# Patient Record
Sex: Female | Born: 1945
Health system: Southern US, Community
[De-identification: ages and names within clinical notes are randomized; demographics above are authoritative.]

## PROBLEM LIST (undated history)

## (undated) DIAGNOSIS — R55 Syncope and collapse: Secondary | ICD-10-CM

## (undated) DIAGNOSIS — C801 Malignant (primary) neoplasm, unspecified: Secondary | ICD-10-CM

## (undated) DIAGNOSIS — J449 Chronic obstructive pulmonary disease, unspecified: Secondary | ICD-10-CM

## (undated) DIAGNOSIS — K219 Gastro-esophageal reflux disease without esophagitis: Secondary | ICD-10-CM

## (undated) DIAGNOSIS — M21611 Bunion of right foot: Secondary | ICD-10-CM

## (undated) DIAGNOSIS — E559 Vitamin D deficiency, unspecified: Secondary | ICD-10-CM

## (undated) DIAGNOSIS — E119 Type 2 diabetes mellitus without complications: Secondary | ICD-10-CM

## (undated) DIAGNOSIS — R970 Elevated carcinoembryonic antigen [CEA]: Secondary | ICD-10-CM

## (undated) DIAGNOSIS — M549 Dorsalgia, unspecified: Secondary | ICD-10-CM

## (undated) DIAGNOSIS — Z8489 Family history of other specified conditions: Secondary | ICD-10-CM

## (undated) DIAGNOSIS — M1990 Unspecified osteoarthritis, unspecified site: Secondary | ICD-10-CM

## (undated) DIAGNOSIS — G8929 Other chronic pain: Secondary | ICD-10-CM

## (undated) DIAGNOSIS — E2839 Other primary ovarian failure: Secondary | ICD-10-CM

## (undated) DIAGNOSIS — I10 Essential (primary) hypertension: Secondary | ICD-10-CM

## (undated) DIAGNOSIS — G4700 Insomnia, unspecified: Secondary | ICD-10-CM

## (undated) DIAGNOSIS — R0989 Other specified symptoms and signs involving the circulatory and respiratory systems: Secondary | ICD-10-CM

## (undated) DIAGNOSIS — E785 Hyperlipidemia, unspecified: Secondary | ICD-10-CM

## (undated) DIAGNOSIS — Z8542 Personal history of malignant neoplasm of other parts of uterus: Secondary | ICD-10-CM

## (undated) DIAGNOSIS — D649 Anemia, unspecified: Secondary | ICD-10-CM

## (undated) DIAGNOSIS — R0683 Snoring: Secondary | ICD-10-CM

## (undated) DIAGNOSIS — J411 Mucopurulent chronic bronchitis: Secondary | ICD-10-CM

## (undated) DIAGNOSIS — R0600 Dyspnea, unspecified: Secondary | ICD-10-CM

## (undated) HISTORY — PX: BREAST SURGERY: SHX581

## (undated) HISTORY — PX: BREAST BIOPSY: SHX20

## (undated) HISTORY — DX: Mucopurulent chronic bronchitis: J41.1

## (undated) HISTORY — DX: Malignant (primary) neoplasm, unspecified: C80.1

## (undated) HISTORY — PX: COLON SURGERY: SHX602

## (undated) HISTORY — DX: Snoring: R06.83

## (undated) HISTORY — DX: Gastro-esophageal reflux disease without esophagitis: K21.9

## (undated) HISTORY — DX: Hyperlipidemia, unspecified: E78.5

## (undated) HISTORY — DX: Other chronic pain: G89.29

## (undated) HISTORY — DX: Dorsalgia, unspecified: M54.9

## (undated) HISTORY — PX: ABDOMINAL HYSTERECTOMY: SHX81

## (undated) HISTORY — DX: Personal history of malignant neoplasm of other parts of uterus: Z85.42

## (undated) HISTORY — DX: Vitamin D deficiency, unspecified: E55.9

## (undated) HISTORY — DX: Other specified symptoms and signs involving the circulatory and respiratory systems: R09.89

## (undated) HISTORY — DX: Other primary ovarian failure: E28.39

## (undated) HISTORY — PX: BACK SURGERY: SHX140

## (undated) HISTORY — DX: Insomnia, unspecified: G47.00

## (undated) HISTORY — PX: JOINT REPLACEMENT: SHX530

## (undated) HISTORY — PX: BREAST EXCISIONAL BIOPSY: SUR124

## (undated) HISTORY — DX: Bunion of right foot: M21.611

---

## 1992-11-29 HISTORY — PX: SPINAL FUSION: SHX223

## 2006-03-23 ENCOUNTER — Ambulatory Visit: Payer: Self-pay | Admitting: Internal Medicine

## 2007-04-27 ENCOUNTER — Ambulatory Visit: Payer: Self-pay | Admitting: Family Medicine

## 2008-05-14 ENCOUNTER — Ambulatory Visit: Payer: Self-pay | Admitting: Family Medicine

## 2009-04-29 LAB — HM COLONOSCOPY

## 2009-05-08 ENCOUNTER — Ambulatory Visit: Payer: Self-pay | Admitting: Gastroenterology

## 2009-05-15 ENCOUNTER — Ambulatory Visit: Payer: Self-pay | Admitting: Family Medicine

## 2009-07-24 ENCOUNTER — Ambulatory Visit: Payer: Self-pay | Admitting: General Surgery

## 2009-07-31 ENCOUNTER — Ambulatory Visit: Payer: Self-pay | Admitting: General Surgery

## 2009-11-29 LAB — HM DEXA SCAN: HM Dexa Scan: NORMAL

## 2010-05-04 DIAGNOSIS — R809 Proteinuria, unspecified: Secondary | ICD-10-CM | POA: Insufficient documentation

## 2010-05-04 DIAGNOSIS — E559 Vitamin D deficiency, unspecified: Secondary | ICD-10-CM | POA: Insufficient documentation

## 2010-05-19 ENCOUNTER — Ambulatory Visit: Payer: Self-pay | Admitting: Family Medicine

## 2010-07-09 LAB — HM PAP SMEAR: HM Pap smear: NORMAL

## 2011-06-01 ENCOUNTER — Ambulatory Visit: Payer: Self-pay | Admitting: Family Medicine

## 2012-07-04 ENCOUNTER — Ambulatory Visit: Payer: Self-pay | Admitting: Family Medicine

## 2013-07-05 ENCOUNTER — Ambulatory Visit: Payer: Self-pay | Admitting: Family Medicine

## 2013-12-14 ENCOUNTER — Ambulatory Visit: Payer: Self-pay | Admitting: Family Medicine

## 2014-07-11 ENCOUNTER — Ambulatory Visit: Payer: Self-pay | Admitting: Family Medicine

## 2014-07-11 LAB — HM MAMMOGRAPHY: HM Mammogram: NORMAL

## 2014-11-25 LAB — LIPID PANEL
Cholesterol: 171 mg/dL (ref 0–200)
HDL: 65 mg/dL (ref 35–70)
LDL Cholesterol: 84 mg/dL
Triglycerides: 112 mg/dL (ref 40–160)

## 2014-12-27 DIAGNOSIS — Z96652 Presence of left artificial knee joint: Secondary | ICD-10-CM | POA: Diagnosis not present

## 2014-12-27 DIAGNOSIS — M25469 Effusion, unspecified knee: Secondary | ICD-10-CM | POA: Diagnosis not present

## 2014-12-27 DIAGNOSIS — Z96653 Presence of artificial knee joint, bilateral: Secondary | ICD-10-CM | POA: Diagnosis not present

## 2014-12-27 DIAGNOSIS — Z96651 Presence of right artificial knee joint: Secondary | ICD-10-CM | POA: Diagnosis not present

## 2014-12-27 DIAGNOSIS — I10 Essential (primary) hypertension: Secondary | ICD-10-CM | POA: Diagnosis not present

## 2014-12-27 DIAGNOSIS — G8929 Other chronic pain: Secondary | ICD-10-CM | POA: Diagnosis not present

## 2014-12-27 DIAGNOSIS — S82102D Unspecified fracture of upper end of left tibia, subsequent encounter for closed fracture with routine healing: Secondary | ICD-10-CM | POA: Diagnosis not present

## 2014-12-27 DIAGNOSIS — M545 Low back pain: Secondary | ICD-10-CM | POA: Diagnosis not present

## 2014-12-27 DIAGNOSIS — Z471 Aftercare following joint replacement surgery: Secondary | ICD-10-CM | POA: Diagnosis not present

## 2015-01-14 DIAGNOSIS — M4802 Spinal stenosis, cervical region: Secondary | ICD-10-CM | POA: Diagnosis not present

## 2015-01-14 DIAGNOSIS — M5116 Intervertebral disc disorders with radiculopathy, lumbar region: Secondary | ICD-10-CM | POA: Insufficient documentation

## 2015-01-14 DIAGNOSIS — M5416 Radiculopathy, lumbar region: Secondary | ICD-10-CM | POA: Diagnosis not present

## 2015-01-14 DIAGNOSIS — M5412 Radiculopathy, cervical region: Secondary | ICD-10-CM | POA: Diagnosis not present

## 2015-01-24 ENCOUNTER — Ambulatory Visit: Payer: Self-pay | Admitting: Physical Medicine and Rehabilitation

## 2015-02-06 DIAGNOSIS — M5030 Other cervical disc degeneration, unspecified cervical region: Secondary | ICD-10-CM | POA: Insufficient documentation

## 2015-02-14 DIAGNOSIS — Z9181 History of falling: Secondary | ICD-10-CM | POA: Diagnosis not present

## 2015-02-14 DIAGNOSIS — R079 Chest pain, unspecified: Secondary | ICD-10-CM | POA: Diagnosis not present

## 2015-02-14 DIAGNOSIS — Z0000 Encounter for general adult medical examination without abnormal findings: Secondary | ICD-10-CM | POA: Diagnosis not present

## 2015-02-14 DIAGNOSIS — Z124 Encounter for screening for malignant neoplasm of cervix: Secondary | ICD-10-CM | POA: Diagnosis not present

## 2015-02-14 DIAGNOSIS — R7989 Other specified abnormal findings of blood chemistry: Secondary | ICD-10-CM | POA: Diagnosis not present

## 2015-02-14 DIAGNOSIS — Z1389 Encounter for screening for other disorder: Secondary | ICD-10-CM | POA: Diagnosis not present

## 2015-02-14 DIAGNOSIS — R5383 Other fatigue: Secondary | ICD-10-CM | POA: Diagnosis not present

## 2015-02-14 DIAGNOSIS — Z1239 Encounter for other screening for malignant neoplasm of breast: Secondary | ICD-10-CM | POA: Diagnosis not present

## 2015-02-14 DIAGNOSIS — Z1211 Encounter for screening for malignant neoplasm of colon: Secondary | ICD-10-CM | POA: Diagnosis not present

## 2015-02-20 HISTORY — PX: OTHER SURGICAL HISTORY: SHX169

## 2015-02-25 DIAGNOSIS — I209 Angina pectoris, unspecified: Secondary | ICD-10-CM | POA: Diagnosis not present

## 2015-02-25 DIAGNOSIS — E782 Mixed hyperlipidemia: Secondary | ICD-10-CM | POA: Diagnosis not present

## 2015-02-25 DIAGNOSIS — I10 Essential (primary) hypertension: Secondary | ICD-10-CM | POA: Diagnosis not present

## 2015-03-03 DIAGNOSIS — M5412 Radiculopathy, cervical region: Secondary | ICD-10-CM | POA: Diagnosis not present

## 2015-03-03 DIAGNOSIS — E782 Mixed hyperlipidemia: Secondary | ICD-10-CM | POA: Diagnosis not present

## 2015-03-03 DIAGNOSIS — I10 Essential (primary) hypertension: Secondary | ICD-10-CM | POA: Diagnosis not present

## 2015-03-03 DIAGNOSIS — I209 Angina pectoris, unspecified: Secondary | ICD-10-CM | POA: Diagnosis not present

## 2015-03-03 DIAGNOSIS — R0789 Other chest pain: Secondary | ICD-10-CM | POA: Diagnosis not present

## 2015-03-28 DIAGNOSIS — E785 Hyperlipidemia, unspecified: Secondary | ICD-10-CM | POA: Diagnosis not present

## 2015-03-28 DIAGNOSIS — D473 Essential (hemorrhagic) thrombocythemia: Secondary | ICD-10-CM | POA: Diagnosis not present

## 2015-03-28 DIAGNOSIS — N189 Chronic kidney disease, unspecified: Secondary | ICD-10-CM | POA: Diagnosis not present

## 2015-03-28 DIAGNOSIS — E1122 Type 2 diabetes mellitus with diabetic chronic kidney disease: Secondary | ICD-10-CM | POA: Diagnosis not present

## 2015-03-28 DIAGNOSIS — Z713 Dietary counseling and surveillance: Secondary | ICD-10-CM | POA: Diagnosis not present

## 2015-03-28 DIAGNOSIS — I10 Essential (primary) hypertension: Secondary | ICD-10-CM | POA: Diagnosis not present

## 2015-03-28 DIAGNOSIS — R809 Proteinuria, unspecified: Secondary | ICD-10-CM | POA: Diagnosis not present

## 2015-03-28 LAB — HEMOGLOBIN A1C: Hgb A1c MFr Bld: 8 % — AB (ref 4.0–6.0)

## 2015-03-31 DIAGNOSIS — S43401A Unspecified sprain of right shoulder joint, initial encounter: Secondary | ICD-10-CM | POA: Diagnosis not present

## 2015-04-11 ENCOUNTER — Encounter
Admission: RE | Disposition: A | Discharge status: Home / Self Care | Payer: Self-pay | Source: Ambulatory Visit | Attending: Gastroenterology

## 2015-04-11 ENCOUNTER — Ambulatory Visit: Payer: Commercial Managed Care - HMO | Admitting: Anesthesiology

## 2015-04-11 ENCOUNTER — Ambulatory Visit
Admission: RE | Admit: 2015-04-11 | Discharge: 2015-04-11 | Disposition: A | Discharge status: Home / Self Care | Payer: Commercial Managed Care - HMO | Source: Ambulatory Visit | Attending: Gastroenterology | Admitting: Gastroenterology

## 2015-04-11 DIAGNOSIS — D124 Benign neoplasm of descending colon: Secondary | ICD-10-CM | POA: Insufficient documentation

## 2015-04-11 DIAGNOSIS — Z7951 Long term (current) use of inhaled steroids: Secondary | ICD-10-CM | POA: Diagnosis not present

## 2015-04-11 DIAGNOSIS — Z86010 Personal history of colonic polyps: Secondary | ICD-10-CM | POA: Insufficient documentation

## 2015-04-11 DIAGNOSIS — Z79899 Other long term (current) drug therapy: Secondary | ICD-10-CM | POA: Diagnosis not present

## 2015-04-11 DIAGNOSIS — D122 Benign neoplasm of ascending colon: Secondary | ICD-10-CM | POA: Diagnosis not present

## 2015-04-11 DIAGNOSIS — D125 Benign neoplasm of sigmoid colon: Secondary | ICD-10-CM | POA: Diagnosis not present

## 2015-04-11 DIAGNOSIS — Z7982 Long term (current) use of aspirin: Secondary | ICD-10-CM | POA: Diagnosis not present

## 2015-04-11 DIAGNOSIS — J45909 Unspecified asthma, uncomplicated: Secondary | ICD-10-CM | POA: Insufficient documentation

## 2015-04-11 DIAGNOSIS — I10 Essential (primary) hypertension: Secondary | ICD-10-CM | POA: Diagnosis not present

## 2015-04-11 DIAGNOSIS — E119 Type 2 diabetes mellitus without complications: Secondary | ICD-10-CM | POA: Diagnosis not present

## 2015-04-11 DIAGNOSIS — D120 Benign neoplasm of cecum: Secondary | ICD-10-CM | POA: Insufficient documentation

## 2015-04-11 DIAGNOSIS — K635 Polyp of colon: Secondary | ICD-10-CM | POA: Diagnosis not present

## 2015-04-11 HISTORY — DX: Essential (primary) hypertension: I10

## 2015-04-11 HISTORY — PX: COLONOSCOPY: SHX5424

## 2015-04-11 HISTORY — DX: Type 2 diabetes mellitus without complications: E11.9

## 2015-04-11 LAB — GLUCOSE, CAPILLARY: Glucose-Capillary: 147 mg/dL — ABNORMAL HIGH (ref 65–99)

## 2015-04-11 SURGERY — COLONOSCOPY
Anesthesia: General

## 2015-04-11 MED ORDER — MIDAZOLAM HCL 2 MG/2ML IJ SOLN: INTRAMUSCULAR | Status: DC | PRN

## 2015-04-11 MED ORDER — FENTANYL CITRATE (PF) 100 MCG/2ML IJ SOLN
INTRAMUSCULAR | Status: DC | PRN
  Administered 2015-04-11: 50 ug via INTRAVENOUS

## 2015-04-11 MED ORDER — MIDAZOLAM HCL 2 MG/2ML IJ SOLN
INTRAMUSCULAR | Status: DC | PRN
  Administered 2015-04-11: 2 mg via INTRAVENOUS

## 2015-04-11 MED ORDER — LIDOCAINE HCL (CARDIAC) 20 MG/ML IV SOLN
INTRAVENOUS | Status: DC | PRN
  Administered 2015-04-11: 60 mg via INTRAVENOUS

## 2015-04-11 MED ORDER — PROPOFOL INFUSION 10 MG/ML OPTIME
INTRAVENOUS | Status: DC | PRN
  Administered 2015-04-11: 160 ug/kg/min via INTRAVENOUS

## 2015-04-11 MED ORDER — SODIUM CHLORIDE 0.9 % IV SOLN
INTRAVENOUS | Status: DC
  Administered 2015-04-11: 1000 mL via INTRAVENOUS

## 2015-04-11 MED ORDER — SODIUM CHLORIDE 0.9 % IV SOLN: INTRAVENOUS | Status: DC

## 2015-04-11 NOTE — Op Note (Signed)
[facility] Gastroenterology Patient Name: Cindy Griffin Procedure Date: 04/11/2015 9:01 AM MRN: [id number] Account #: [id number] Date of Birth: 10-08-1946 Admit Type: Outpatient Age: 69 Room: [facility] ENDO ROOM 4 Gender: Female Note Status: Finalized Procedure:         Colonoscopy Indications:       Personal history of colonic polyps Providers:         Lupita Dawn. Candace Cruise, MD Referring MD:      Bethena Roys. Sowles, MD (Referring MD) Medicines:         Monitored Anesthesia Care Complications:     No immediate complications. Procedure:         Pre-Anesthesia Assessment:                    - Prior to the procedure, a History and Physical was                     performed, and patient medications, allergies and                     sensitivities were reviewed. The patient's tolerance of                     previous anesthesia was reviewed.                    - The risks and benefits of the procedure and the sedation                     options and risks were discussed with the patient. All                     questions were answered and informed consent was obtained.                    - After reviewing the risks and benefits, the patient was                     deemed in satisfactory condition to undergo the procedure.                    After obtaining informed consent, the colonoscope was                     passed under direct vision. Throughout the procedure, the                     patient's blood pressure, pulse, and oxygen saturations                     were monitored continuously. The Colonoscope was                     introduced through the anus and advanced to the the cecum,                     identified by appendiceal orifice and ileocecal valve. The                     colonoscopy was performed without difficulty. The patient                     tolerated the procedure well. The quality of the bowel  preparation was fair. Findings:      A small  polyp was found in the proximal ascending colon. The polyp was       sessile. The polyp was removed with a cold snare. Resection and       retrieval were complete.      A diminutive polyp was found in the cecum. The polyp was sessile. The       polyp was removed with a jumbo cold forceps. Resection and retrieval       were complete.      A medium polyp was found in the proximal ascending colon. The polyp was       sessile. The polyp was removed with a hot snare. Resection and retrieval       were complete.      A small polyp was found in the descending colon. The polyp was sessile.       The polyp was removed with a hot snare. Resection and retrieval were       complete.      Two sessile polyps were found in the sigmoid colon. The polyps were       small in size. These polyps were removed with a hot snare. Resection and       retrieval were complete.      The exam was otherwise without abnormality. Impression:        - One small polyp in the proximal ascending colon.                     Resected and retrieved.                    - One diminutive polyp in the cecum. Resected and                     retrieved.                    - One medium polyp in the proximal ascending colon.                     Resected and retrieved.                    - One small polyp in the descending colon. Resected and                     retrieved.                    - Two small polyps in the sigmoid colon. Resected and                     retrieved.                    - The examination was otherwise normal. Recommendation:    - Discharge patient to home.                    - Await pathology results.                    - Repeat colonoscopy in 3 years for surveillance based on                     pathology results.                    -  The findings and recommendations were discussed with the                     patient. Procedure Code(s): --- Professional ---                    [phone], Colonoscopy, flexible;  with removal of tumor(s),                     polyp(s), or other lesion(s) by snare technique                    [id number], 28, Colonoscopy, flexible; with biopsy, single or                     multiple Diagnosis Code(s): --- Professional ---                    D12.0, Benign neoplasm of cecum                    D12.2, Benign neoplasm of ascending colon                    D12.4, Benign neoplasm of descending colon                    D12.5, Benign neoplasm of sigmoid colon                    Z86.010, Personal history of colonic polyps CPT copyright 2014 [employer]. All rights reserved. The codes documented in this report are preliminary and upon coder review may  be revised to meet current compliance requirements. Hulen Luster, MD 04/11/2015 9:30:26 AM This report has been signed electronically. Number of Addenda: 0 Note Initiated On: 04/11/2015 9:01 AM Scope Withdrawal Time: 0 hours 16 minutes 37 seconds  Total Procedure Duration: 0 hours 21 minutes 53 seconds       [facility]

## 2015-04-11 NOTE — Transfer of Care (Signed)
Immediate Anesthesia Transfer of Care Note  Patient: Cindy Griffin  Procedure(s) Performed: Procedure(s): COLONOSCOPY (N/A)  Patient Location: PACU and Endoscopy Unit  Anesthesia Type:General  Level of Consciousness: awake, alert  and oriented  Airway & Oxygen Therapy: Patient Spontanous Breathing and Patient connected to nasal cannula oxygen  Post-op Assessment: Report given to RN, Post -op Vital signs reviewed and stable and Patient moving all extremities X 4  Post vital signs: Reviewed and stable  Last Vitals:  Filed Vitals:   04/11/15 0823  BP: 135/99  Pulse: 75  Temp: 36.4 C  Resp: 17    Complications: No apparent anesthesia complications

## 2015-04-11 NOTE — Anesthesia Preprocedure Evaluation (Addendum)
Anesthesia Evaluation  Patient identified by MRN, date of birth, ID band Patient awake    Reviewed: Allergy & Precautions, NPO status , Patient's Chart, lab work & pertinent test results  Airway Mallampati: II   Neck ROM: Full    Dental  (+) Upper Dentures, Lower Dentures   Pulmonary  Sinus issues breath sounds clear to auscultation  Pulmonary exam normal       Cardiovascular hypertension, Rhythm:Regular Rate:Normal     Neuro/Psych negative neurological ROS  negative psych ROS   GI/Hepatic Neg liver ROS, Hx of colon polyps   Endo/Other  diabetes, Well Controlled, Type 2  Renal/GU   negative genitourinary   Musculoskeletal negative musculoskeletal ROS (+)   Abdominal Normal abdominal exam  (+)   Peds negative pediatric ROS (+)  Hematology negative hematology ROS (+)   Anesthesia Other Findings   Reproductive/Obstetrics negative OB ROS                            Anesthesia Physical Anesthesia Plan  ASA: II  Anesthesia Plan: General   Post-op Pain Management:    Induction: Intravenous  Airway Management Planned: Nasal Cannula  Additional Equipment:   Intra-op Plan:   Post-operative Plan:   Informed Consent: I have reviewed the patients History and Physical, chart, labs and discussed the procedure including the risks, benefits and alternatives for the proposed anesthesia with the patient or authorized representative who has indicated his/her understanding and acceptance.   Dental advisory given  Plan Discussed with: CRNA and Surgeon  Anesthesia Plan Comments:         Anesthesia Quick Evaluation

## 2015-04-11 NOTE — Anesthesia Postprocedure Evaluation (Signed)
  Anesthesia Post-op Note  Patient: Cindy Griffin  Procedure(s) Performed: Procedure(s): COLONOSCOPY (N/A)  Anesthesia type:General  Patient location: PACU  Post pain: Pain level controlled  Post assessment: Post-op Vital signs reviewed, Patient's Cardiovascular Status Stable, Respiratory Function Stable, Patent Airway and No signs of Nausea or vomiting  Post vital signs: Reviewed and stable  Last Vitals:  Filed Vitals:   04/11/15 0930  BP: 97/58  Pulse: 78  Temp: 36.3 C  Resp: 23    Level of consciousness: awake, alert  and patient cooperative  Complications: No apparent anesthesia complications

## 2015-04-11 NOTE — H&P (Signed)
Primary Care Physician:  Christianne Borrow, MD Primary Gastroenterologist:  Dr. Candace Cruise  Pre-Procedure History & Physical: HPI:  Cindy Griffin is a 69 y.o. female is here for colonoscopy.   Past Medical History  Diagnosis Date  . Hypertension   . Diabetes mellitus without complication     Past Surgical History  Procedure Laterality Date  . Abdominal hysterectomy    . Joint replacement      left knee 2013 right knee 2005    Prior to Admission medications   Medication Sig Start Date End Date Taking? Authorizing Provider  acetaminophen (TYLENOL) 500 MG tablet Take 1,000 mg by mouth every 6 (six) hours as needed (pain).   Yes Historical Provider, MD  aspirin EC 81 MG tablet Take 81 mg by mouth daily.   Yes Historical Provider, MD  atorvastatin (LIPITOR) 80 MG tablet Take 80 mg by mouth daily.   Yes Historical Provider, MD  Bisacodyl (DULCOLAX PO) Take 1 tablet by mouth once.   Yes Historical Provider, MD  canagliflozin (INVOKANA) 300 MG TABS tablet Take 300 mg by mouth daily before breakfast.   Yes Historical Provider, MD  celecoxib (CELEBREX) 200 MG capsule Take 200 mg by mouth daily.   Yes Historical Provider, MD  Fluticasone-Salmeterol (ADVAIR) 100-50 MCG/DOSE AEPB Inhale 1 puff into the lungs 2 (two) times daily.   Yes Historical Provider, MD  glipiZIDE (GLUCOTROL) 5 MG tablet Take 5 mg by mouth daily before breakfast.   Yes Historical Provider, MD  hydrochlorothiazide (HYDRODIURIL) 25 MG tablet Take 25 mg by mouth daily.   Yes Historical Provider, MD  lisinopril (PRINIVIL,ZESTRIL) 40 MG tablet Take 40 mg by mouth 2 (two) times daily.   Yes Historical Provider, MD  metFORMIN (GLUCOPHAGE) 1000 MG tablet Take 1,000 mg by mouth 2 (two) times daily with a meal.   Yes Historical Provider, MD  Multiple Vitamins-Minerals (MULTIVITAMIN PO) Take 1 tablet by mouth daily.   Yes Historical Provider, MD  Omega-3 Fatty Acids (FISH OIL PO) Take 1 capsule by mouth daily.   Yes Historical  Provider, MD  omeprazole (PRILOSEC) 20 MG capsule Take 20 mg by mouth daily.   Yes Historical Provider, MD  polyethylene glycol powder (GLYCOLAX/MIRALAX) powder Take 1 Container by mouth once.   Yes Historical Provider, MD  tizanidine (ZANAFLEX) 2 MG capsule Take 2 mg by mouth 3 (three) times daily as needed for muscle spasms.   Yes Historical Provider, MD  traMADol (ULTRAM) 50 MG tablet Take 50 mg by mouth every 6 (six) hours as needed (pain).   Yes Historical Provider, MD    Allergies as of 03/25/2015  . (Not on File)    History reviewed. No pertinent family history.  History   Social History  . Marital Status: Widowed    Spouse Name: N/A  . Number of Children: N/A  . Years of Education: N/A   Occupational History  . Not on file.   Social History Main Topics  . Smoking status: Never Smoker   . Smokeless tobacco: Not on file  . Alcohol Use: Not on file  . Drug Use: Not on file  . Sexual Activity: Not on file   Other Topics Concern  . Not on file   Social History Narrative  . No narrative on file    Review of Systems: See HPI, otherwise negative ROS  Physical Exam: BP 135/99 mmHg  Pulse 75  Temp(Src) 97.5 F (36.4 C) (Tympanic)  Resp 17  Ht '5\' 4"'$  (1.626  m)  Wt 90.719 kg (200 lb)  BMI 34.31 kg/m2  SpO2 98% General:   Alert,  pleasant and cooperative in NAD Head:  Normocephalic and atraumatic. Neck:  Supple; no masses or thyromegaly. Lungs:  Clear throughout to auscultation.    Heart:  Regular rate and rhythm. Abdomen:  Soft, nontender and nondistended. Normal bowel sounds, without guarding, and without rebound.   Neurologic:  Alert and  oriented x4;  grossly normal neurologically.  Impression/Plan: Cindy Griffin is here for colonoscopy to be performed for personal hx of colon polyps.  Risks, benefits, limitations, and alternatives regarding colonoscopy have been reviewed with the patient.  Questions have been answered.  All parties  agreeable.   Cindy Griffin, Lupita Dawn, MD  04/11/2015, 8:34 AM

## 2015-04-14 ENCOUNTER — Encounter: Payer: Self-pay | Admitting: Gastroenterology

## 2015-04-14 DIAGNOSIS — S43401A Unspecified sprain of right shoulder joint, initial encounter: Secondary | ICD-10-CM | POA: Diagnosis not present

## 2015-04-15 LAB — SURGICAL PATHOLOGY

## 2015-04-29 DIAGNOSIS — S43401A Unspecified sprain of right shoulder joint, initial encounter: Secondary | ICD-10-CM | POA: Diagnosis not present

## 2015-05-05 DIAGNOSIS — M5136 Other intervertebral disc degeneration, lumbar region: Secondary | ICD-10-CM | POA: Diagnosis not present

## 2015-05-05 DIAGNOSIS — M5416 Radiculopathy, lumbar region: Secondary | ICD-10-CM | POA: Diagnosis not present

## 2015-05-05 DIAGNOSIS — M4802 Spinal stenosis, cervical region: Secondary | ICD-10-CM | POA: Diagnosis not present

## 2015-05-05 DIAGNOSIS — M5030 Other cervical disc degeneration, unspecified cervical region: Secondary | ICD-10-CM | POA: Diagnosis not present

## 2015-05-06 DIAGNOSIS — S43401A Unspecified sprain of right shoulder joint, initial encounter: Secondary | ICD-10-CM | POA: Diagnosis not present

## 2015-05-08 ENCOUNTER — Telehealth: Payer: Self-pay | Admitting: Family Medicine

## 2015-05-08 NOTE — Telephone Encounter (Signed)
I reviewed her chart, back to Dec 2015 and there is no diagnosis of back pain.  We do have her neck pain.  She will have to be seen or her insurance will not approve the referral.

## 2015-05-08 NOTE — Telephone Encounter (Signed)
SPOKE WITH PT AND SHE HAS APPT ON June 20 AND WILL DISCUSS REFERRAL THEN.

## 2015-05-20 DIAGNOSIS — S43401A Unspecified sprain of right shoulder joint, initial encounter: Secondary | ICD-10-CM | POA: Diagnosis not present

## 2015-05-25 ENCOUNTER — Encounter: Payer: Self-pay | Admitting: Family Medicine

## 2015-05-25 DIAGNOSIS — L304 Erythema intertrigo: Secondary | ICD-10-CM | POA: Insufficient documentation

## 2015-05-25 DIAGNOSIS — E785 Hyperlipidemia, unspecified: Secondary | ICD-10-CM | POA: Insufficient documentation

## 2015-05-25 DIAGNOSIS — J3089 Other allergic rhinitis: Secondary | ICD-10-CM | POA: Insufficient documentation

## 2015-05-25 DIAGNOSIS — M1990 Unspecified osteoarthritis, unspecified site: Secondary | ICD-10-CM | POA: Insufficient documentation

## 2015-05-25 DIAGNOSIS — M21619 Bunion of unspecified foot: Secondary | ICD-10-CM | POA: Insufficient documentation

## 2015-05-25 DIAGNOSIS — M5412 Radiculopathy, cervical region: Secondary | ICD-10-CM | POA: Insufficient documentation

## 2015-05-25 DIAGNOSIS — J411 Mucopurulent chronic bronchitis: Secondary | ICD-10-CM | POA: Insufficient documentation

## 2015-05-25 DIAGNOSIS — I517 Cardiomegaly: Secondary | ICD-10-CM | POA: Insufficient documentation

## 2015-05-25 DIAGNOSIS — IMO0002 Reserved for concepts with insufficient information to code with codable children: Secondary | ICD-10-CM | POA: Insufficient documentation

## 2015-05-25 DIAGNOSIS — M549 Dorsalgia, unspecified: Secondary | ICD-10-CM

## 2015-05-25 DIAGNOSIS — E1129 Type 2 diabetes mellitus with other diabetic kidney complication: Secondary | ICD-10-CM | POA: Insufficient documentation

## 2015-05-25 DIAGNOSIS — R7989 Other specified abnormal findings of blood chemistry: Secondary | ICD-10-CM | POA: Insufficient documentation

## 2015-05-25 DIAGNOSIS — K219 Gastro-esophageal reflux disease without esophagitis: Secondary | ICD-10-CM | POA: Insufficient documentation

## 2015-05-25 DIAGNOSIS — D229 Melanocytic nevi, unspecified: Secondary | ICD-10-CM | POA: Insufficient documentation

## 2015-05-25 DIAGNOSIS — E1165 Type 2 diabetes mellitus with hyperglycemia: Secondary | ICD-10-CM

## 2015-05-25 DIAGNOSIS — G8929 Other chronic pain: Secondary | ICD-10-CM | POA: Insufficient documentation

## 2015-05-25 DIAGNOSIS — E119 Type 2 diabetes mellitus without complications: Secondary | ICD-10-CM | POA: Insufficient documentation

## 2015-05-25 DIAGNOSIS — R0683 Snoring: Secondary | ICD-10-CM | POA: Insufficient documentation

## 2015-05-25 DIAGNOSIS — Z9660 Presence of unspecified orthopedic joint implant: Secondary | ICD-10-CM | POA: Insufficient documentation

## 2015-05-25 DIAGNOSIS — G4700 Insomnia, unspecified: Secondary | ICD-10-CM | POA: Insufficient documentation

## 2015-05-25 DIAGNOSIS — I10 Essential (primary) hypertension: Secondary | ICD-10-CM | POA: Insufficient documentation

## 2015-05-29 ENCOUNTER — Ambulatory Visit (INDEPENDENT_AMBULATORY_CARE_PROVIDER_SITE_OTHER): Payer: Commercial Managed Care - HMO | Admitting: Family Medicine

## 2015-05-29 ENCOUNTER — Encounter: Payer: Self-pay | Admitting: Family Medicine

## 2015-05-29 VITALS — BP 128/80 | HR 88 | Temp 97.8°F | Resp 16 | Ht 63.0 in | Wt 223.7 lb

## 2015-05-29 DIAGNOSIS — E1129 Type 2 diabetes mellitus with other diabetic kidney complication: Secondary | ICD-10-CM

## 2015-05-29 DIAGNOSIS — I10 Essential (primary) hypertension: Secondary | ICD-10-CM | POA: Diagnosis not present

## 2015-05-29 DIAGNOSIS — E1165 Type 2 diabetes mellitus with hyperglycemia: Secondary | ICD-10-CM | POA: Diagnosis not present

## 2015-05-29 DIAGNOSIS — E785 Hyperlipidemia, unspecified: Secondary | ICD-10-CM | POA: Diagnosis not present

## 2015-05-29 DIAGNOSIS — K219 Gastro-esophageal reflux disease without esophagitis: Secondary | ICD-10-CM

## 2015-05-29 DIAGNOSIS — M5116 Intervertebral disc disorders with radiculopathy, lumbar region: Secondary | ICD-10-CM | POA: Diagnosis not present

## 2015-05-29 DIAGNOSIS — J309 Allergic rhinitis, unspecified: Secondary | ICD-10-CM | POA: Diagnosis not present

## 2015-05-29 DIAGNOSIS — IMO0002 Reserved for concepts with insufficient information to code with codable children: Secondary | ICD-10-CM

## 2015-05-29 DIAGNOSIS — J3089 Other allergic rhinitis: Secondary | ICD-10-CM

## 2015-05-29 MED ORDER — LISINOPRIL 40 MG PO TABS: 40.0000 mg | ORAL_TABLET | Freq: Two times a day (BID) | ORAL | Status: DC

## 2015-05-29 MED ORDER — GLIPIZIDE 5 MG PO TABS: 5.0000 mg | ORAL_TABLET | Freq: Two times a day (BID) | ORAL | Status: DC

## 2015-05-29 MED ORDER — GLUCOSE BLOOD VI STRP: 1.0000 | ORAL_STRIP | Freq: Four times a day (QID) | Status: DC

## 2015-05-29 MED ORDER — METFORMIN HCL 1000 MG PO TABS: 1000.0000 mg | ORAL_TABLET | Freq: Two times a day (BID) | ORAL | Status: DC

## 2015-05-29 MED ORDER — OMEPRAZOLE 20 MG PO CPDR: 20.0000 mg | DELAYED_RELEASE_CAPSULE | Freq: Every day | ORAL | Status: DC

## 2015-05-29 MED ORDER — HYDROCHLOROTHIAZIDE 25 MG PO TABS: 25.0000 mg | ORAL_TABLET | Freq: Every day | ORAL | Status: DC

## 2015-05-29 MED ORDER — ATORVASTATIN CALCIUM 80 MG PO TABS: 80.0000 mg | ORAL_TABLET | Freq: Every day | ORAL | Status: DC

## 2015-05-29 MED ORDER — CANAGLIFLOZIN 300 MG PO TABS: 300.0000 mg | ORAL_TABLET | Freq: Every day | ORAL | Status: DC

## 2015-05-29 MED ORDER — CELECOXIB 200 MG PO CAPS: 200.0000 mg | ORAL_CAPSULE | Freq: Every day | ORAL | Status: DC

## 2015-05-29 MED ORDER — TRAMADOL HCL 50 MG PO TABS: 50.0000 mg | ORAL_TABLET | Freq: Four times a day (QID) | ORAL | Status: DC | PRN

## 2015-05-29 MED ORDER — FLUTICASONE PROPIONATE 50 MCG/ACT NA SUSP: 2.0000 | Freq: Every day | NASAL | Status: DC

## 2015-05-29 NOTE — Progress Notes (Signed)
Name: Cindy Griffin   MRN: [id number]    DOB: [birth year]   Date:05/29/2015       Progress Note  Subjective  Chief Complaint  Chief Complaint  Patient presents with  . Medication Refill    2 month F/U  . Diabetes    Increased Metformin 1000 mg last visit, but was making her bowels upset. Checks BS 2-3 daily Low-78 Average-93  High-234  . Allergic Rhinitis     Sinus Drainage every night, waking up with Cough-clear/yellow mucus. Onset-months, worstening would like to try Singular    HPI  DMII: she has been checking fsbs at home and fasting is usually above goal, and post-prandially is at goal. Taking medication, but unable to tolerate three Metformins daily because of diarrhea.  She denies polyphagia or polyuria, she refuses to try any form of injectable medications. Denies hypoglycemia.    AR: she has rhinorrhea, post-nasal drainage, sneezing, eye pruritus. She is taking otc Zyrtec but is not working, she would like to try singulair. However explained to her that Fluticasone may be a better option and she agrees  Low back pain with Radiculitis: symptoms going on intermittently for years. She has seen the pain clinic in the past and responded to steroid injections.  She is now seeing Dr. Sharlet Salina and he wants to repeat therapy but because of her insurance I need to order the MRI of her lumbar spine. She has daily back , worse over the past three months, and left toe is constant numb and radiates to left outer leg and hip.  She is taking Tramadol and Celebrex , she has tried Lyrica and Neurontin in the past but could not tolerate side effects.   HTN: bp is good today, no side effects Patient Active Problem List   Diagnosis Date Noted  . Benign essential HTN 05/25/2015  . Bunion 05/25/2015  . Cardiac enlargement 05/25/2015  . Cervical pain 05/25/2015  . Back pain, chronic 05/25/2015  . Osteoarthritis 05/25/2015  . Dyslipidemia 05/25/2015  . Gastric reflux 05/25/2015  . Insomnia  05/25/2015  . Eczema intertrigo 05/25/2015  . Bronchitis, chronic, mucopurulent 05/25/2015  . Numerous moles 05/25/2015  . Obesity (BMI 30-39.9) 05/25/2015  . Perennial allergic rhinitis 05/25/2015  . Elevated platelet count 05/25/2015  . DM (diabetes mellitus), type 2, uncontrolled, with renal complications 46/96/2952  . Snores 05/25/2015  . History of artificial joint 05/25/2015  . Neuritis or radiculitis due to rupture of lumbar intervertebral disc 01/14/2015  . Abnormal presence of protein in urine 05/04/2010  . Vitamin D deficiency 05/04/2010    Past Surgical History  Procedure Laterality Date  . Colonoscopy N/A 04/11/2015    Procedure: COLONOSCOPY;  Surgeon: Hulen Luster, MD;  Location: [facility] ENDOSCOPY;  Service: Gastroenterology;  Laterality: N/A;  . Abdominal hysterectomy      due to cancer-partial  . Joint replacement      left knee 2013 right knee 2005  . Spinal fusion      C-Spine  . Breast biopsy Right     Benign  . Transforaminal epidural  02/20/2015    Injection into cervical spine- C5-6 Dr. Phyllis Ginger    Family History  Problem Relation Age of Onset  . Diabetes Mother   . Kidney disease Mother   . Hypertension Mother   . Asthma Daughter   . Diabetes Sister   . Diabetes Brother     History   Social History  . Marital Status: Widowed    Spouse Name: N/A  .  Number of Children: N/A  . Years of Education: N/A   Occupational History  . Not on file.   Social History Main Topics  . Smoking status: Never Smoker   . Smokeless tobacco: Never Used  . Alcohol Use: 0.0 oz/week    0 Standard drinks or equivalent per week     Comment: occasionally  . Drug Use: No  . Sexual Activity: Not Currently   Other Topics Concern  . Not on file   Social History Narrative     Current outpatient prescriptions:  .  acetaminophen (TYLENOL) 500 MG tablet, Take 1,000 mg by mouth every 6 (six) hours as needed (pain)., Disp: , Rfl:  .  aspirin EC 81 MG tablet, Take 81 mg by  mouth daily., Disp: , Rfl:  .  atorvastatin (LIPITOR) 80 MG tablet, Take 1 tablet (80 mg total) by mouth daily., Disp: 90 tablet, Rfl: 1 .  canagliflozin (INVOKANA) 300 MG TABS tablet, Take 300 mg by mouth daily before breakfast., Disp: 90 tablet, Rfl: 1 .  celecoxib (CELEBREX) 200 MG capsule, Take 1 capsule (200 mg total) by mouth daily., Disp: 30 capsule, Rfl: 5 .  cetirizine (ZYRTEC) 10 MG tablet, Take 1 tablet by mouth daily., Disp: , Rfl:  .  Cholecalciferol (VITAMIN D3) 2000 UNITS capsule, Take 1 capsule by mouth daily., Disp: , Rfl:  .  ferrous sulfate 325 (65 FE) MG EC tablet, Take 1 tablet by mouth daily., Disp: , Rfl:  .  Fluticasone-Salmeterol (ADVAIR) 100-50 MCG/DOSE AEPB, Inhale 1 puff into the lungs 2 (two) times daily., Disp: , Rfl:  .  glipiZIDE (GLUCOTROL) 5 MG tablet, Take 1 tablet (5 mg total) by mouth 2 (two) times daily before a meal., Disp: 180 tablet, Rfl: 1 .  hydrochlorothiazide (HYDRODIURIL) 25 MG tablet, Take 1 tablet (25 mg total) by mouth daily., Disp: 90 tablet, Rfl: 1 .  lisinopril (PRINIVIL,ZESTRIL) 40 MG tablet, Take 1 tablet (40 mg total) by mouth 2 (two) times daily., Disp: 90 tablet, Rfl: 1 .  metFORMIN (GLUCOPHAGE) 1000 MG tablet, Take 1 tablet (1,000 mg total) by mouth 2 (two) times daily with a meal., Disp: 180 tablet, Rfl: 1 .  Multiple Vitamins-Minerals (MULTIVITAMIN PO), Take 1 tablet by mouth daily., Disp: , Rfl:  .  Omega-3 Fatty Acids (FISH OIL PO), Take 1 capsule by mouth daily., Disp: , Rfl:  .  omeprazole (PRILOSEC) 20 MG capsule, Take 1 capsule (20 mg total) by mouth daily., Disp: 90 capsule, Rfl: 4 .  traMADol (ULTRAM) 50 MG tablet, Take 1 tablet (50 mg total) by mouth every 6 (six) hours as needed (pain)., Disp: 90 tablet, Rfl: 3 .  glucose blood (ADVOCATE REDI-CODE) test strip, 1 each by Other route QID., Disp: 100 each, Rfl: 12  No Known Allergies   ROS  Constitutional: Negative for fever or weight change.  Respiratory: positive  for cough  and occasionally has  shortness of breath.   Cardiovascular: Negative for chest pain or palpitations.  Gastrointestinal: Negative for abdominal pain, no bowel changes.  Musculoskeletal: Negative for gait problem or joint swelling. Back pain with radiculitis Skin: Negative for rash.  Neurological: Negative for  headache. She has dizziness from neck injury. She has massage therapy  No other specific complaints in a complete review of systems (except as listed in HPI above).  Objective  Filed Vitals:   05/29/15 1001  BP: 128/80  Pulse: 88  Temp: 97.8 F (36.6 C)  TempSrc: Oral  Resp: 16  Height:  $'5\' 3"'H$  (1.6 m)  Weight: 223 lb 11.2 oz (101.47 kg)  SpO2: 97%    Body mass index is 39.64 kg/(m^2).  Physical Exam  Constitutional: Patient appears well-developed and well-nourished. No distress.  Eyes:  No scleral icterus. PERL Neck: Normal range of motion. Neck supple. Cardiovascular: Normal rate, regular rhythm and normal heart sounds.  No murmur heard. No BLE edema. Pulmonary/Chest: Effort normal and breath sounds normal. No respiratory distress. Abdominal: Soft.  There is no tenderness. Psychiatric: Patient has a normal mood and affect. behavior is normal. Judgment and thought content normal. Muscular Skeletal: pain during palpation of lumbar spine, neg straight leg raise  Recent Results (from the past 2160 hour(s))  Hemoglobin A1c     Status: Abnormal   Collection Time: 03/28/15 12:00 AM  Result Value Ref Range   Hgb A1c MFr Bld 8.0 (A) 4.0 - 6.0 %  Glucose, capillary     Status: Abnormal   Collection Time: 04/11/15  8:40 AM  Result Value Ref Range   Glucose-Capillary 147 (H) 65 - 99 mg/dL  Surgical pathology     Status: None   Collection Time: 04/11/15  9:08 AM  Result Value Ref Range   SURGICAL PATHOLOGY      Surgical Pathology CASE: [id number] PATIENT: Jamal Maes Surgical Pathology Report     SPECIMEN SUBMITTED: A. Colon polyp, ascending, cold snare B. Cecum  polyp, cbx C. Colon polyp, ascending, hot snare D. Colon polyp, descending, hot snare E. Colon polyp, sigmoid, hot snare  CLINICAL HISTORY: None provided  PRE-OPERATIVE DIAGNOSIS: History Colon polyps  POST-OPERATIVE DIAGNOSIS: None provided     DIAGNOSIS: A. COLON POLYP, ASCENDING; COLD SNARE: - TUBULAR ADENOMA. - NEGATIVE FOR HIGH GRADE DYSPLASIA AND MALIGNANCY.  B. CECUM POLYP; COLD BIOPSY: - TUBULAR ADENOMA. - NEGATIVE FOR HIGH GRADE DYSPLASIA AND MALIGNANCY.  C. COLON POLYP, ASCENDING; HOT SNARE: - TUBULAR ADENOMA. - NEGATIVE FOR HIGH GRADE DYSPLASIA AND MALIGNANCY.  D. COLON POLYP, DESCENDING; HOT SNARE: - TUBULAR ADENOMA. - NEGATIVE FOR HIGH GRADE DYSPLASIA AND MALIGNANCY.  E. COLON POLYP, SIGMOID; HOT SNARE: - TUBULAR ADENOMA. - NEGATIVE FOR HIGH GRADE DYSPLASIA AND MALIGNANCY.    GROSS DESCRIPTION:  A. Labeled: cold snare ascending colon polyp #1, lid labeled #1 Tissue Fragment(s): 1 Measurement: 0.5 x 0.3 x 0.1 cm Comment: tan fragment, inked blue  Entirely submitted in cassette(s): 1  B. Labeled: cecum polyp C biopsy #2, lid labeled #2 Tissue Fragment(s): 2 Measurement: 0.2-0.25 cm Comment: Tan  Entirely submitted in cassette(s): 1  C. Labeled: hot snare ascending colon polyp #3, lid labeled #4 Tissue Fragment(s): multiple Measurement: aggregate, 2.0 x 1.1 x 0.1 cm Comment: pink-tan fragment fecal and mucus material  Entirely submitted in cassette(s): 1    D. Labeled: hot snare ascending colon polyp #4, lid labeled #3 Tissue Fragment(s): 1 Measurement: 0.5 cm Comment: Tan, inked blue  Entirely submitted in cassette(s): 1  E. Labeled: hot snare sigmoid colon polyp #5, lid labeled #5 Tissue Fragment(s): multiple Measurement: aggregate, 1.5 x 1.0 x 0.1 cm Comment: tan fragments and fecal mucoid material  Entirely submi tted in cassette(s): 1           Final Diagnosis performed by Quay Burow, MD.  Electronically  signed 04/15/2015 12:48:32PM    The electronic signature indicates that the named Attending Pathologist has evaluated the specimen  Technical component performed at [facility], [address], [place], [place] [address] Lab: [phone] Dir: Darrick Penna. Evette Doffing, MD  Professional component performed at [facility]  [facility], [place], [place], [place] [address] Lab: [phone] Dir: Dellia Nims. Rubinas, MD       PHQ2/9: Depression screen PHQ 2/9 05/29/2015  Decreased Interest 0  Down, Depressed, Hopeless 0  PHQ - 2 Score 0     Fall Risk: Fall Risk  05/29/2015  Falls in the past year? No     Assessment & Plan  1. DM (diabetes mellitus), type 2, uncontrolled, with renal complications Needs to stop sweet beverages, increase glipizide to twice daily , explained risk of hypoglycemia - POCT HgB A1C - glipiZIDE (GLUCOTROL) 5 MG tablet; Take 1 tablet (5 mg total) by mouth 2 (two) times daily before a meal.  Dispense: 180 tablet; Refill: 1 - metFORMIN (GLUCOPHAGE) 1000 MG tablet; Take 1 tablet (1,000 mg total) by mouth 2 (two) times daily with a meal.  Dispense: 180 tablet; Refill: 1 - canagliflozin (INVOKANA) 300 MG TABS tablet; Take 300 mg by mouth daily before breakfast.  Dispense: 90 tablet; Refill: 1 - glucose blood (ADVOCATE REDI-CODE) test strip; 1 each by Other route QID.  Dispense: 100 each; Refill: 12  2. Neuritis or radiculitis due to rupture of lumbar intervertebral disc  - celecoxib (CELEBREX) 200 MG capsule; Take 1 capsule (200 mg total) by mouth daily.  Dispense: 30 capsule; Refill: 5 - traMADol (ULTRAM) 50 MG tablet; Take 1 tablet (50 mg total) by mouth every 6 (six) hours as needed (pain).  Dispense: 90 tablet; Refill: 3 - MR Lumbar Spine Wo Contrast; Future  3. Perennial allergic rhinitis Resume  Fluticasone  4. Dyslipidemia  - atorvastatin (LIPITOR) 80 MG tablet; Take 1 tablet (80 mg total) by mouth daily.  Dispense: 90 tablet; Refill:  1  5. Benign essential HTN  - lisinopril (PRINIVIL,ZESTRIL) 40 MG tablet; Take 1 tablet (40 mg total) by mouth 2 (two) times daily.  Dispense: 90 tablet; Refill: 1 - hydrochlorothiazide (HYDRODIURIL) 25 MG tablet; Take 1 tablet (25 mg total) by mouth daily.  Dispense: 90 tablet; Refill: 1  6. Gastric reflux  - omeprazole (PRILOSEC) 20 MG capsule; Take 1 capsule (20 mg total) by mouth daily.  Dispense: 90 capsule; Refill: 4

## 2015-06-04 ENCOUNTER — Telehealth: Payer: Self-pay | Admitting: Family Medicine

## 2015-06-04 NOTE — Telephone Encounter (Signed)
[employer] pre-authorization has been placed on 06/04/15 and authorization is pending.

## 2015-06-04 NOTE — Telephone Encounter (Signed)
[name] from [place] is needing authorization for MRI w/o contrast lumbar spine. CPT: [phone] Patient has [facility]. If you have any questions please call her back at [phone]

## 2015-06-05 ENCOUNTER — Ambulatory Visit: Payer: Commercial Managed Care - HMO

## 2015-06-12 ENCOUNTER — Telehealth: Payer: Self-pay

## 2015-06-12 ENCOUNTER — Other Ambulatory Visit: Payer: Self-pay | Admitting: Family Medicine

## 2015-06-12 ENCOUNTER — Ambulatory Visit
Admission: RE | Admit: 2015-06-12 | Discharge: 2015-06-12 | Disposition: A | Discharge status: Home / Self Care | Payer: Commercial Managed Care - HMO | Source: Ambulatory Visit | Attending: Family Medicine | Admitting: Family Medicine

## 2015-06-12 DIAGNOSIS — M545 Low back pain: Secondary | ICD-10-CM | POA: Diagnosis present

## 2015-06-12 DIAGNOSIS — M5136 Other intervertebral disc degeneration, lumbar region: Secondary | ICD-10-CM | POA: Diagnosis not present

## 2015-06-12 DIAGNOSIS — M4806 Spinal stenosis, lumbar region: Secondary | ICD-10-CM | POA: Diagnosis not present

## 2015-06-12 DIAGNOSIS — G544 Lumbosacral root disorders, not elsewhere classified: Secondary | ICD-10-CM | POA: Insufficient documentation

## 2015-06-12 DIAGNOSIS — M4807 Spinal stenosis, lumbosacral region: Secondary | ICD-10-CM

## 2015-06-12 DIAGNOSIS — M5387 Other specified dorsopathies, lumbosacral region: Secondary | ICD-10-CM | POA: Insufficient documentation

## 2015-06-12 DIAGNOSIS — M5416 Radiculopathy, lumbar region: Secondary | ICD-10-CM | POA: Diagnosis not present

## 2015-06-12 DIAGNOSIS — M48061 Spinal stenosis, lumbar region without neurogenic claudication: Secondary | ICD-10-CM

## 2015-06-12 DIAGNOSIS — M5116 Intervertebral disc disorders with radiculopathy, lumbar region: Secondary | ICD-10-CM

## 2015-06-12 NOTE — Progress Notes (Signed)
Referral has been sent to [facility]

## 2015-06-12 NOTE — Telephone Encounter (Signed)
Pt states she wants to go back to [place] she does not want to see neurosurgeon!

## 2015-06-12 NOTE — Telephone Encounter (Signed)
Does she need a referral or does she already has an appointment?

## 2015-06-13 ENCOUNTER — Telehealth: Payer: Self-pay

## 2015-06-13 DIAGNOSIS — M48061 Spinal stenosis, lumbar region without neurogenic claudication: Secondary | ICD-10-CM

## 2015-06-13 NOTE — Telephone Encounter (Signed)
Needs a new referral placed with Dr. Phyllis Ginger

## 2015-06-23 DIAGNOSIS — M4316 Spondylolisthesis, lumbar region: Secondary | ICD-10-CM | POA: Diagnosis not present

## 2015-07-28 DIAGNOSIS — M4806 Spinal stenosis, lumbar region: Secondary | ICD-10-CM | POA: Diagnosis not present

## 2015-07-28 DIAGNOSIS — M5136 Other intervertebral disc degeneration, lumbar region: Secondary | ICD-10-CM | POA: Insufficient documentation

## 2015-07-28 DIAGNOSIS — M5416 Radiculopathy, lumbar region: Secondary | ICD-10-CM | POA: Diagnosis not present

## 2015-07-29 ENCOUNTER — Ambulatory Visit: Payer: Self-pay | Admitting: Family Medicine

## 2015-08-27 DIAGNOSIS — M5416 Radiculopathy, lumbar region: Secondary | ICD-10-CM | POA: Diagnosis not present

## 2015-08-27 DIAGNOSIS — M4806 Spinal stenosis, lumbar region: Secondary | ICD-10-CM | POA: Diagnosis not present

## 2015-08-27 DIAGNOSIS — M5136 Other intervertebral disc degeneration, lumbar region: Secondary | ICD-10-CM | POA: Diagnosis not present

## 2015-08-29 ENCOUNTER — Ambulatory Visit (INDEPENDENT_AMBULATORY_CARE_PROVIDER_SITE_OTHER): Payer: Commercial Managed Care - HMO | Admitting: Family Medicine

## 2015-08-29 ENCOUNTER — Encounter: Payer: Self-pay | Admitting: Family Medicine

## 2015-08-29 VITALS — BP 118/66 | HR 77 | Temp 97.7°F | Resp 16 | Ht 63.0 in | Wt 225.2 lb

## 2015-08-29 DIAGNOSIS — G4700 Insomnia, unspecified: Secondary | ICD-10-CM

## 2015-08-29 DIAGNOSIS — E1129 Type 2 diabetes mellitus with other diabetic kidney complication: Secondary | ICD-10-CM

## 2015-08-29 DIAGNOSIS — E785 Hyperlipidemia, unspecified: Secondary | ICD-10-CM

## 2015-08-29 DIAGNOSIS — M5116 Intervertebral disc disorders with radiculopathy, lumbar region: Secondary | ICD-10-CM

## 2015-08-29 DIAGNOSIS — M5412 Radiculopathy, cervical region: Secondary | ICD-10-CM | POA: Diagnosis not present

## 2015-08-29 DIAGNOSIS — IMO0002 Reserved for concepts with insufficient information to code with codable children: Secondary | ICD-10-CM

## 2015-08-29 DIAGNOSIS — I10 Essential (primary) hypertension: Secondary | ICD-10-CM | POA: Diagnosis not present

## 2015-08-29 DIAGNOSIS — Z23 Encounter for immunization: Secondary | ICD-10-CM | POA: Diagnosis not present

## 2015-08-29 DIAGNOSIS — E1165 Type 2 diabetes mellitus with hyperglycemia: Secondary | ICD-10-CM | POA: Diagnosis not present

## 2015-08-29 LAB — POCT GLYCOSYLATED HEMOGLOBIN (HGB A1C): Hemoglobin A1C: 7.5

## 2015-08-29 MED ORDER — TRAZODONE HCL 50 MG PO TABS: 25.0000 mg | ORAL_TABLET | Freq: Every evening | ORAL | Status: DC | PRN

## 2015-08-29 MED ORDER — NALTREXONE-BUPROPION HCL ER 8-90 MG PO TB12: 2.0000 | ORAL_TABLET | Freq: Two times a day (BID) | ORAL | Status: DC

## 2015-08-29 NOTE — Progress Notes (Signed)
Name: Cindy Griffin   MRN: [id number]    DOB: 1946-02-09   Date:08/29/2015       Progress Note  Subjective  Chief Complaint  Chief Complaint  Patient presents with  . Medication Refill    follow-up  . Diabetes    checks BG 2a week low-90's,high-200  . Hypertension    dizziness  . Hyperlipidemia  . Gastrophageal Reflux  . Insomnia    onset several months difficulty staying asleep avg3-4hrs per hour  . Obesity    wants to see about getting on weight loss med contrave    HPI  DMII with nephropathy: she is taking medications as prescribed, denies side effects. She has polyphagia, but denies polydipsia or polyuria. FSB average 130's.  She has an appointment with ophthalmologist next month.   HTN: taking medication and denies side effects of medication. No chest pain or SOB. She states she occasionally has vertigo, not secondary to medication for bp. Triggered by head movement  Hyperlipidemia: taking Atorvastatin and denies side effects of medications  Cervical and lumbar radiculitis: seeing Dr. Phyllis Ginger, had a Left L4-5 transforaminal epidural injection under fluoroscopic guidance this past week, states symptoms are improving, going back for cervical injection next  Insomnia:  She has difficulty staying asleep.  She is not taking any medications for it, but would like to try something.   Morbid Obesity: she has a friend that is taking Contrave and has lost  50 lbs, she would like to try medication, she is aware that is not covered by insurance and will have to stop taking Ultram for pain.  She is currently walking and states she eats healthy. She states at night she feels very hungry and eats a lot after dinner.    Patient Active Problem List   Diagnosis Date Noted  . DDD (degenerative disc disease), lumbar 07/28/2015  . Spinal stenosis at L4-L5 level 06/12/2015  . Benign essential HTN 05/25/2015  . Bunion 05/25/2015  . Cardiac enlargement 05/25/2015  . Cervical  radicular pain 05/25/2015  . Back pain, chronic 05/25/2015  . Osteoarthritis 05/25/2015  . Dyslipidemia 05/25/2015  . Gastric reflux 05/25/2015  . Insomnia 05/25/2015  . Eczema intertrigo 05/25/2015  . Bronchitis, chronic, mucopurulent 05/25/2015  . Numerous moles 05/25/2015  . Morbid obesity 05/25/2015  . Perennial allergic rhinitis 05/25/2015  . Elevated platelet count 05/25/2015  . DM (diabetes mellitus), type 2, uncontrolled, with renal complications 94/49/6759  . Snores 05/25/2015  . History of artificial joint 05/25/2015  . Neuritis or radiculitis due to rupture of lumbar intervertebral disc 01/14/2015  . Abnormal presence of protein in urine 05/04/2010  . Vitamin D deficiency 05/04/2010    Past Surgical History  Procedure Laterality Date  . Colonoscopy N/A 04/11/2015    Procedure: COLONOSCOPY;  Surgeon: Hulen Luster, MD;  Location: [facility] ENDOSCOPY;  Service: Gastroenterology;  Laterality: N/A;  . Abdominal hysterectomy      due to cancer-partial  . Joint replacement      left knee 2013 right knee 2005  . Spinal fusion      C-Spine  . Breast biopsy Right     Benign  . Transforaminal epidural  02/20/2015    Injection into cervical spine- C5-6 Dr. Phyllis Ginger    Family History  Problem Relation Age of Onset  . Diabetes Mother   . Kidney disease Mother   . Hypertension Mother   . Asthma Daughter   . Diabetes Sister   . Diabetes Brother  Social History   Social History  . Marital Status: Widowed    Spouse Name: N/A  . Number of Children: N/A  . Years of Education: N/A   Occupational History  . Not on file.   Social History Main Topics  . Smoking status: Never Smoker   . Smokeless tobacco: Never Used  . Alcohol Use: 0.0 oz/week    0 Standard drinks or equivalent per week     Comment: occasionally  . Drug Use: No  . Sexual Activity: Not Currently   Other Topics Concern  . Not on file   Social History Narrative     Current outpatient prescriptions:   .  acetaminophen (TYLENOL) 500 MG tablet, Take 1,000 mg by mouth every 6 (six) hours as needed (pain)., Disp: , Rfl:  .  aspirin EC 81 MG tablet, Take 81 mg by mouth daily., Disp: , Rfl:  .  atorvastatin (LIPITOR) 80 MG tablet, Take 1 tablet (80 mg total) by mouth daily., Disp: 90 tablet, Rfl: 1 .  canagliflozin (INVOKANA) 300 MG TABS tablet, Take 300 mg by mouth daily before breakfast., Disp: 90 tablet, Rfl: 1 .  celecoxib (CELEBREX) 200 MG capsule, Take 1 capsule (200 mg total) by mouth daily., Disp: 30 capsule, Rfl: 5 .  cetirizine (ZYRTEC) 10 MG tablet, Take 1 tablet by mouth daily., Disp: , Rfl:  .  Cholecalciferol (VITAMIN D3) 2000 UNITS capsule, Take 1 capsule by mouth daily., Disp: , Rfl:  .  ferrous sulfate 325 (65 FE) MG EC tablet, Take 1 tablet by mouth daily., Disp: , Rfl:  .  fluticasone (FLONASE) 50 MCG/ACT nasal spray, Place 2 sprays into both nostrils daily., Disp: 16 g, Rfl: 6 .  Fluticasone-Salmeterol (ADVAIR) 100-50 MCG/DOSE AEPB, Inhale 1 puff into the lungs 2 (two) times daily., Disp: , Rfl:  .  glipiZIDE (GLUCOTROL) 5 MG tablet, Take 1 tablet (5 mg total) by mouth 2 (two) times daily before a meal., Disp: 180 tablet, Rfl: 1 .  glucose blood (ADVOCATE REDI-CODE) test strip, 1 each by Other route QID., Disp: 100 each, Rfl: 12 .  hydrochlorothiazide (HYDRODIURIL) 25 MG tablet, Take 1 tablet (25 mg total) by mouth daily., Disp: 90 tablet, Rfl: 1 .  lisinopril (PRINIVIL,ZESTRIL) 40 MG tablet, Take 1 tablet (40 mg total) by mouth 2 (two) times daily., Disp: 90 tablet, Rfl: 1 .  metFORMIN (GLUCOPHAGE) 1000 MG tablet, Take 1 tablet (1,000 mg total) by mouth 2 (two) times daily with a meal., Disp: 180 tablet, Rfl: 1 .  Multiple Vitamins-Minerals (MULTIVITAMIN PO), Take 1 tablet by mouth daily., Disp: , Rfl:  .  Naltrexone-Bupropion HCl ER (CONTRAVE) 8-90 MG TB12, Take 2 tablets by mouth 2 (two) times daily., Disp: 120 tablet, Rfl: 0 .  Omega-3 Fatty Acids (FISH OIL PO), Take 1 capsule  by mouth daily., Disp: , Rfl:  .  omeprazole (PRILOSEC) 20 MG capsule, Take 1 capsule (20 mg total) by mouth daily., Disp: 90 capsule, Rfl: 4 .  tiZANidine (ZANAFLEX) 2 MG tablet, , Disp: , Rfl:  .  traZODone (DESYREL) 50 MG tablet, Take 0.5-1 tablets (25-50 mg total) by mouth at bedtime as needed for sleep., Disp: 30 tablet, Rfl: 3  No Known Allergies   ROS  Constitutional: Negative for fever or weight change.  Respiratory: Negative for cough and shortness of breath.   Cardiovascular: Negative for chest pain or palpitations.  Gastrointestinal: Negative for abdominal pain, no bowel changes.  Musculoskeletal: Negative for gait problem or joint swelling.  Skin: Negative for rash.  Neurological: Occasional  dizziness no  headache.  No other specific complaints in a complete review of systems (except as listed in HPI above).  Objective  Filed Vitals:   08/29/15 0956  BP: 118/66  Pulse: 77  Temp: 97.7 F (36.5 C)  TempSrc: Oral  Resp: 16  Height: '5\' 3"'$  (1.6 m)  Weight: 225 lb 3.2 oz (102.15 kg)  SpO2: 96%    Body mass index is 39.9 kg/(m^2).  Physical Exam  Constitutional: Patient appears well-developed and well-nourished. Obese No distress.  HEENT: head atraumatic, normocephalic, pupils equal and reactive to light, neck supple, throat within normal limits Cardiovascular: Normal rate, regular rhythm and normal heart sounds.  No murmur heard. No BLE edema. Pulmonary/Chest: Effort normal and breath sounds normal. No respiratory distress. Abdominal: Soft.  There is no tenderness. Psychiatric: Patient has a normal mood and affect. behavior is normal. Judgment and thought content normal.  Recent Results (from the past 2160 hour(s))  POCT HgB A1C     Status: Abnormal   Collection Time: 08/29/15 10:05 AM  Result Value Ref Range   Hemoglobin A1C 7.5     PHQ2/9: Depression screen [facility] 2/9 08/29/2015 05/29/2015  Decreased Interest 0 0  Down, Depressed, Hopeless 1 0  PHQ - 2  Score 1 0     Fall Risk: Fall Risk  08/29/2015 05/29/2015  Falls in the past year? No No      Functional Status Survey: Is the patient deaf or have difficulty hearing?: No Does the patient have difficulty seeing, even when wearing glasses/contacts?: Yes (glasses) Does the patient have difficulty concentrating, remembering, or making decisions?: No Does the patient have difficulty walking or climbing stairs?: No Does the patient have difficulty dressing or bathing?: No Does the patient have difficulty doing errands alone such as visiting a doctor's office or shopping?: No   Assessment & Plan  1. DM (diabetes mellitus), type 2, uncontrolled, with renal complications  Continue medications, hgbA1C 7.5, is coming down and it is at goal for her - POCT HgB A1C  2. Needs flu shot  - Flu vaccine HIGH DOSE PF (Fluzone High dose)  3. Neuritis or radiculitis due to rupture of lumbar intervertebral disc   Doing well, continue follow up with Dr. Phyllis Ginger  4. Benign essential HTN    at goal  5. Morbid obesity  Discussed all optiosns for weight loss medications including Belviq, Qsymia, Saxenda and Contrave. Discussed risk and benefits of each of them. She chose Contrave Discussed all optiosns for weight loss medications including Belviq, Qsymia, Saxenda and Contrave. Discussed risk and benefits of each of them. - Naltrexone-Bupropion HCl ER (CONTRAVE) 8-90 MG TB12; Take 2 tablets by mouth 2 (two) times daily.  Dispense: 120 tablet; Refill: 0  6. Dyslipidemia   Continue medication, recheck labs yearly   7. Insomnia   We will try Trazodone qhs - traZODone (DESYREL) 50 MG tablet; Take 0.5-1 tablets (25-50 mg total) by mouth at bedtime as needed for sleep.  Dispense: 30 tablet; Refill: 3  8. Cervical radicular pain  Getting epidural injection next week

## 2015-09-25 DIAGNOSIS — M5412 Radiculopathy, cervical region: Secondary | ICD-10-CM | POA: Diagnosis not present

## 2015-09-25 DIAGNOSIS — M5030 Other cervical disc degeneration, unspecified cervical region: Secondary | ICD-10-CM | POA: Diagnosis not present

## 2015-10-01 DIAGNOSIS — H5200 Hypermetropia, unspecified eye: Secondary | ICD-10-CM | POA: Diagnosis not present

## 2015-10-01 DIAGNOSIS — E109 Type 1 diabetes mellitus without complications: Secondary | ICD-10-CM | POA: Diagnosis not present

## 2015-10-01 DIAGNOSIS — H25813 Combined forms of age-related cataract, bilateral: Secondary | ICD-10-CM | POA: Diagnosis not present

## 2015-10-01 DIAGNOSIS — H5210 Myopia, unspecified eye: Secondary | ICD-10-CM | POA: Diagnosis not present

## 2015-10-08 ENCOUNTER — Other Ambulatory Visit: Payer: Self-pay | Admitting: Family Medicine

## 2015-10-08 ENCOUNTER — Ambulatory Visit: Payer: Commercial Managed Care - HMO

## 2015-10-08 DIAGNOSIS — Z111 Encounter for screening for respiratory tuberculosis: Secondary | ICD-10-CM

## 2015-10-08 MED ORDER — TUBERCULIN PPD 5 UNIT/0.1ML ID SOLN
5.0000 [IU] | Freq: Once | INTRADERMAL | Status: AC
  Administered 2015-10-08: 5 [IU] via INTRADERMAL

## 2015-10-08 NOTE — Telephone Encounter (Signed)
Sent to Dr. Ancil Boozer

## 2015-10-09 ENCOUNTER — Telehealth: Payer: Self-pay

## 2015-10-10 ENCOUNTER — Ambulatory Visit: Payer: Commercial Managed Care - HMO | Admitting: Family Medicine

## 2015-10-10 ENCOUNTER — Encounter: Payer: Self-pay | Admitting: Family Medicine

## 2015-10-10 NOTE — Progress Notes (Signed)
Immunization

## 2015-10-14 NOTE — Telephone Encounter (Signed)
Opened in error

## 2015-10-22 ENCOUNTER — Encounter: Payer: Self-pay | Admitting: Family Medicine

## 2015-10-22 ENCOUNTER — Other Ambulatory Visit: Payer: Self-pay

## 2015-10-22 NOTE — Telephone Encounter (Signed)
Patient requesting refill. 

## 2015-10-23 MED ORDER — ACCU-CHEK NANO SMARTVIEW W/DEVICE KIT: 1.0000 | PACK | Freq: Three times a day (TID) | Status: DC

## 2015-10-29 DIAGNOSIS — Z0100 Encounter for examination of eyes and vision without abnormal findings: Secondary | ICD-10-CM | POA: Diagnosis not present

## 2015-11-04 DIAGNOSIS — M5416 Radiculopathy, lumbar region: Secondary | ICD-10-CM | POA: Diagnosis not present

## 2015-11-04 DIAGNOSIS — M5136 Other intervertebral disc degeneration, lumbar region: Secondary | ICD-10-CM | POA: Diagnosis not present

## 2015-11-04 DIAGNOSIS — M4806 Spinal stenosis, lumbar region: Secondary | ICD-10-CM | POA: Diagnosis not present

## 2015-11-21 ENCOUNTER — Ambulatory Visit (INDEPENDENT_AMBULATORY_CARE_PROVIDER_SITE_OTHER): Payer: Commercial Managed Care - HMO | Admitting: Family Medicine

## 2015-11-21 ENCOUNTER — Encounter: Payer: Self-pay | Admitting: Family Medicine

## 2015-11-21 VITALS — BP 118/62 | HR 84 | Temp 97.6°F | Resp 18 | Ht 63.0 in | Wt 225.4 lb

## 2015-11-21 DIAGNOSIS — E1165 Type 2 diabetes mellitus with hyperglycemia: Secondary | ICD-10-CM

## 2015-11-21 DIAGNOSIS — D473 Essential (hemorrhagic) thrombocythemia: Secondary | ICD-10-CM

## 2015-11-21 DIAGNOSIS — G4700 Insomnia, unspecified: Secondary | ICD-10-CM | POA: Diagnosis not present

## 2015-11-21 DIAGNOSIS — E785 Hyperlipidemia, unspecified: Secondary | ICD-10-CM

## 2015-11-21 DIAGNOSIS — N182 Chronic kidney disease, stage 2 (mild): Secondary | ICD-10-CM

## 2015-11-21 DIAGNOSIS — M5116 Intervertebral disc disorders with radiculopathy, lumbar region: Secondary | ICD-10-CM

## 2015-11-21 DIAGNOSIS — IMO0002 Reserved for concepts with insufficient information to code with codable children: Secondary | ICD-10-CM

## 2015-11-21 DIAGNOSIS — R7989 Other specified abnormal findings of blood chemistry: Secondary | ICD-10-CM

## 2015-11-21 DIAGNOSIS — M75102 Unspecified rotator cuff tear or rupture of left shoulder, not specified as traumatic: Secondary | ICD-10-CM

## 2015-11-21 DIAGNOSIS — K219 Gastro-esophageal reflux disease without esophagitis: Secondary | ICD-10-CM

## 2015-11-21 DIAGNOSIS — J411 Mucopurulent chronic bronchitis: Secondary | ICD-10-CM | POA: Diagnosis not present

## 2015-11-21 DIAGNOSIS — E559 Vitamin D deficiency, unspecified: Secondary | ICD-10-CM | POA: Diagnosis not present

## 2015-11-21 DIAGNOSIS — I10 Essential (primary) hypertension: Secondary | ICD-10-CM | POA: Diagnosis not present

## 2015-11-21 DIAGNOSIS — E1122 Type 2 diabetes mellitus with diabetic chronic kidney disease: Secondary | ICD-10-CM | POA: Diagnosis not present

## 2015-11-21 LAB — POCT UA - MICROALBUMIN: Microalbumin Ur, POC: 20 mg/L

## 2015-11-21 LAB — POCT GLYCOSYLATED HEMOGLOBIN (HGB A1C): Hemoglobin A1C: 8.1

## 2015-11-21 MED ORDER — OMEPRAZOLE 20 MG PO CPDR: 20.0000 mg | DELAYED_RELEASE_CAPSULE | Freq: Every day | ORAL | Status: DC

## 2015-11-21 MED ORDER — METFORMIN HCL 1000 MG PO TABS: 1000.0000 mg | ORAL_TABLET | Freq: Two times a day (BID) | ORAL | Status: DC

## 2015-11-21 MED ORDER — FLUTICASONE-SALMETEROL 100-50 MCG/DOSE IN AEPB: 1.0000 | INHALATION_SPRAY | Freq: Two times a day (BID) | RESPIRATORY_TRACT | Status: DC

## 2015-11-21 MED ORDER — GLIPIZIDE 5 MG PO TABS: 5.0000 mg | ORAL_TABLET | Freq: Two times a day (BID) | ORAL | Status: DC

## 2015-11-21 MED ORDER — CANAGLIFLOZIN 300 MG PO TABS: 300.0000 mg | ORAL_TABLET | Freq: Every day | ORAL | Status: DC

## 2015-11-21 MED ORDER — LISINOPRIL 40 MG PO TABS: 40.0000 mg | ORAL_TABLET | Freq: Two times a day (BID) | ORAL | Status: DC

## 2015-11-21 MED ORDER — CELECOXIB 200 MG PO CAPS: 200.0000 mg | ORAL_CAPSULE | Freq: Every day | ORAL | Status: DC

## 2015-11-21 MED ORDER — HYDROCHLOROTHIAZIDE 25 MG PO TABS: 25.0000 mg | ORAL_TABLET | Freq: Every day | ORAL | Status: DC

## 2015-11-21 MED ORDER — ATORVASTATIN CALCIUM 80 MG PO TABS: 80.0000 mg | ORAL_TABLET | Freq: Every day | ORAL | Status: DC

## 2015-11-21 MED ORDER — TRAZODONE HCL 50 MG PO TABS: 25.0000 mg | ORAL_TABLET | Freq: Every evening | ORAL | Status: DC | PRN

## 2015-11-21 NOTE — Progress Notes (Signed)
Name: Cindy Griffin   MRN: [id number]    DOB: 05-Jun-1946   Date:11/21/2015       Progress Note  Subjective  Chief Complaint  Chief Complaint  Patient presents with  . Medication Refill    3 month F/U  . Diabetes    Check twice daily, Low-84 Average-140's High-242  . Hyperlipidemia  . Hypertension  . Gastroesophageal Reflux    Well controlled with medication  . Obesity    Could not tolerate Contrave, make her nausea and had to stop it  . Shoulder Pain    Left shoulder and radiates to elbow, Has been seeing Dr. Sharlet Salina and been getting epidural shots 2 weeks ago    HPI  DMII with nephropathy: she is taking medications as prescribed, denies side effects. She has polyphagia- and likes to eat bread and sweets - she has not been compliant with her diet lately and hgbA1C has gone up from 7.5% to 8.1%.  She  denies polydipsia or polyuria. FSB average 140's She is up to date with eye exam, we will get records  HTN: taking medication and denies side effects of medication. No chest pain or SOB.   Hyperlipidemia: taking Atorvastatin and denies side effects of medications, due for labs  Cervical and lumbar radiculitis: seeing Dr. Phyllis Ginger, had a Left L4-5 transforaminal epidural injection , states symptoms are improving, going back for cervical injection next  Insomnia: she has difficulty falling and staying asleep, discussed taking Trazodone every night, also to have a better sleep hygiene. Go to be at the same time and getting up after 8 hours.   Morbid Obesity: she tried Contrave but made her vomit.  She goes to gym twice or three times weekly. She states at night she feels very hungry and eats a lot after dinner. She has also been eating sweets.    Patient Active Problem List   Diagnosis Date Noted  . DDD (degenerative disc disease), lumbar 07/28/2015  . Spinal stenosis at L4-L5 level 06/12/2015  . Benign essential HTN 05/25/2015  . Bunion 05/25/2015  . Cardiac enlargement  05/25/2015  . Cervical radicular pain 05/25/2015  . Back pain, chronic 05/25/2015  . Osteoarthritis 05/25/2015  . Dyslipidemia 05/25/2015  . Gastric reflux 05/25/2015  . Insomnia 05/25/2015  . Eczema intertrigo 05/25/2015  . Bronchitis, chronic, mucopurulent ([place]) 05/25/2015  . Numerous moles 05/25/2015  . Morbid obesity ([place]) 05/25/2015  . Perennial allergic rhinitis 05/25/2015  . Elevated platelet count ([place]) 05/25/2015  . DM (diabetes mellitus), type 2, uncontrolled, with renal complications ([place]) 40/81/4481  . Snores 05/25/2015  . History of artificial joint 05/25/2015  . Degeneration of intervertebral disc of cervical region 02/06/2015  . Neuritis or radiculitis due to rupture of lumbar intervertebral disc 01/14/2015  . Abnormal presence of protein in urine 05/04/2010  . Vitamin D deficiency 05/04/2010    Past Surgical History  Procedure Laterality Date  . Colonoscopy N/A 04/11/2015    Procedure: COLONOSCOPY;  Surgeon: Hulen Luster, MD;  Location: [facility] ENDOSCOPY;  Service: Gastroenterology;  Laterality: N/A;  . Abdominal hysterectomy      due to cancer-partial  . Joint replacement      left knee 2013 right knee 2005  . Spinal fusion      C-Spine  . Breast biopsy Right     Benign  . Transforaminal epidural  02/20/2015    Injection into cervical spine- C5-6 Dr. Phyllis Ginger    Family History  Problem Relation Age of Onset  .  Diabetes Mother   . Kidney disease Mother   . Hypertension Mother   . Asthma Daughter   . Diabetes Sister   . Diabetes Brother     Social History   Social History  . Marital Status: Widowed    Spouse Name: N/A  . Number of Children: N/A  . Years of Education: N/A   Occupational History  . Not on file.   Social History Main Topics  . Smoking status: Never Smoker   . Smokeless tobacco: Never Used  . Alcohol Use: 0.0 oz/week    0 Standard drinks or equivalent per week     Comment: occasionally  . Drug Use: No  . Sexual Activity: Not  Currently   Other Topics Concern  . Not on file   Social History Narrative     Current outpatient prescriptions:  .  acetaminophen (TYLENOL) 500 MG tablet, Take 1,000 mg by mouth every 6 (six) hours as needed (pain)., Disp: , Rfl:  .  aspirin EC 81 MG tablet, Take 81 mg by mouth daily., Disp: , Rfl:  .  atorvastatin (LIPITOR) 80 MG tablet, Take 1 tablet (80 mg total) by mouth daily., Disp: 90 tablet, Rfl: 1 .  Blood Glucose Monitoring Suppl (ACCU-CHEK NANO SMARTVIEW) W/DEVICE KIT, 1 Device by Does not apply route 3 (three) times daily., Disp: 1 kit, Rfl: 0 .  canagliflozin (INVOKANA) 300 MG TABS tablet, Take 300 mg by mouth daily before breakfast., Disp: 90 tablet, Rfl: 1 .  celecoxib (CELEBREX) 200 MG capsule, Take 1 capsule (200 mg total) by mouth daily., Disp: 90 capsule, Rfl: 1 .  cetirizine (ZYRTEC) 10 MG tablet, Take 1 tablet by mouth daily., Disp: , Rfl:  .  ferrous sulfate 325 (65 FE) MG EC tablet, Take 1 tablet by mouth daily., Disp: , Rfl:  .  fluticasone (FLONASE) 50 MCG/ACT nasal spray, Place 2 sprays into both nostrils daily., Disp: 16 g, Rfl: 6 .  Fluticasone-Salmeterol (ADVAIR) 100-50 MCG/DOSE AEPB, Inhale 1 puff into the lungs 2 (two) times daily., Disp: 60 each, Rfl: 5 .  glipiZIDE (GLUCOTROL) 5 MG tablet, Take 1 tablet (5 mg total) by mouth 2 (two) times daily before a meal., Disp: 180 tablet, Rfl: 1 .  glucose blood (ADVOCATE REDI-CODE) test strip, 1 each by Other route QID., Disp: 100 each, Rfl: 12 .  hydrochlorothiazide (HYDRODIURIL) 25 MG tablet, Take 1 tablet (25 mg total) by mouth daily., Disp: 90 tablet, Rfl: 1 .  lisinopril (PRINIVIL,ZESTRIL) 40 MG tablet, Take 1 tablet (40 mg total) by mouth 2 (two) times daily., Disp: 180 tablet, Rfl: 1 .  metFORMIN (GLUCOPHAGE) 1000 MG tablet, Take 1 tablet (1,000 mg total) by mouth 2 (two) times daily with a meal., Disp: 180 tablet, Rfl: 1 .  Multiple Vitamins-Minerals (MULTIVITAMIN PO), Take 1 tablet by mouth daily., Disp: ,  Rfl:  .  Omega-3 Fatty Acids (FISH OIL PO), Take 1 capsule by mouth daily., Disp: , Rfl:  .  omeprazole (PRILOSEC) 20 MG capsule, Take 1 capsule (20 mg total) by mouth daily., Disp: 90 capsule, Rfl: 4 .  tiZANidine (ZANAFLEX) 2 MG tablet, , Disp: , Rfl:  .  traMADol (ULTRAM) 50 MG tablet, , Disp: , Rfl:  .  traZODone (DESYREL) 50 MG tablet, Take 0.5-1 tablets (25-50 mg total) by mouth at bedtime as needed for sleep., Disp: 90 tablet, Rfl: 1 .  Vitamins A & D (VITAMIN A & D) 10000-400 UNITS TABS, Take by mouth., Disp: , Rfl:  No Known Allergies   ROS  Ten systems reviewed and is negative except as mentioned in HPI    Objective  Filed Vitals:   11/21/15 1032  BP: 118/62  Pulse: 84  Temp: 97.6 F (36.4 C)  TempSrc: Oral  Resp: 18  Height: _0  (1.6 m)  Weight: 225 lb 6.4 oz (102.241 kg)  SpO2: 97%    Body mass index is 39.94 kg/(m^2).  Physical Exam  Constitutional: Patient appears well-developed and well-nourished. Obese  No distress.  HEENT: head atraumatic, normocephalic, pupils equal and reactive to light, ears TM, neck supple, throat within normal limits Cardiovascular: Normal rate, regular rhythm and normal heart sounds.  No murmur heard. No BLE edema. Pulmonary/Chest: Effort normal and breath sounds normal. No respiratory distress. Abdominal: Soft.  There is no tenderness. Psychiatric: Patient has a normal mood and affect. behavior is normal. Judgment and thought content normal.  Recent Results (from the past 2160 hour(s))  POCT HgB A1C     Status: Abnormal   Collection Time: 08/29/15 10:05 AM  Result Value Ref Range   Hemoglobin A1C 7.5   POCT HgB A1C     Status: Abnormal   Collection Time: 11/21/15 10:39 AM  Result Value Ref Range   Hemoglobin A1C 8.1   POCT UA - Microalbumin     Status: Abnormal   Collection Time: 11/21/15 10:39 AM  Result Value Ref Range   Microalbumin Ur, POC 20 mg/L   Creatinine, POC  mg/dL   Albumin/Creatinine Ratio, Urine, POC       PHQ2/9: Depression screen [facility] 2/9 11/21/2015 08/29/2015 05/29/2015  Decreased Interest 0 0 0  Down, Depressed, Hopeless 0 1 0  PHQ - 2 Score 0 1 0    Fall Risk: Fall Risk  11/21/2015 08/29/2015 05/29/2015  Falls in the past year? No No No    Functional Status Survey: Is the patient deaf or have difficulty hearing?: No Does the patient have difficulty seeing, even when wearing glasses/contacts?: Yes (glasses) Does the patient have difficulty concentrating, remembering, or making decisions?: No Does the patient have difficulty walking or climbing stairs?: No Does the patient have difficulty dressing or bathing?: No Does the patient have difficulty doing errands alone such as visiting a doctor's office or shopping?: No    Assessment & Plan  1. Uncontrolled type 2 diabetes mellitus with stage 2 chronic kidney disease, unspecified long term insulin use status ([place])  Needs to resume a healthy diet - POCT HgB A1C - POCT UA - Microalbumin - metFORMIN (GLUCOPHAGE) 1000 MG tablet; Take 1 tablet (1,000 mg total) by mouth 2 (two) times daily with a meal.  Dispense: 180 tablet; Refill: 1 - glipiZIDE (GLUCOTROL) 5 MG tablet; Take 1 tablet (5 mg total) by mouth 2 (two) times daily before a meal.  Dispense: 180 tablet; Refill: 1 - canagliflozin (INVOKANA) 300 MG TABS tablet; Take 300 mg by mouth daily before breakfast.  Dispense: 90 tablet; Refill: 1  2. Bronchitis, chronic, mucopurulent ([place])  Needs to continue Advair  3. Elevated platelet count (H[facility])  - CBC with Differential/Platelet  4. Morbid obesity due to excess calories ([place])  Discussed life style modification   5. Dyslipidemia  - atorvastatin (LIPITOR) 80 MG tablet; Take 1 tablet (80 mg total) by mouth daily.  Dispense: 90 tablet; Refill: 1 - Lipid panel  6. Insomnia  Needs to take it every night and have a better sleep hygiene - traZODone (DESYREL) 50 MG tablet; Take 0.5-1 tablets (25-50 mg total)  by mouth at bedtime  as needed for sleep.  Dispense: 90 tablet; Refill: 1  7. Rotator cuff syndrome of left shoulder  She refuses PT, will follow up with Dr. Phyllis Ginger  8. Gastric reflux  - omeprazole (PRILOSEC) 20 MG capsule; Take 1 capsule (20 mg total) by mouth daily.  Dispense: 90 capsule; Refill: 4   9. Benign essential HTN  - hydrochlorothiazide (HYDRODIURIL) 25 MG tablet; Take 1 tablet (25 mg total) by mouth daily.  Dispense: 90 tablet; Refill: 1 - Comprehensive metabolic panel  10. Neuritis or radiculitis due to rupture of lumbar intervertebral disc  - celecoxib (CELEBREX) 200 MG capsule; Take 1 capsule (200 mg total) by mouth daily.  Dispense: 90 capsule; Refill: 1  11. Vitamin D deficiency  - VITAMIN D 25 Hydroxy (Vit-D Deficiency, Fractures)

## 2015-11-22 LAB — CBC WITH DIFFERENTIAL/PLATELET
Basophils Absolute: 0 10*3/uL (ref 0.0–0.2)
Basos: 0 %
EOS (ABSOLUTE): 0.2 10*3/uL (ref 0.0–0.4)
Eos: 2 %
Hematocrit: 41.6 % (ref 34.0–46.6)
Hemoglobin: 14 g/dL (ref 11.1–15.9)
Immature Grans (Abs): 0 10*3/uL (ref 0.0–0.1)
Immature Granulocytes: 0 %
Lymphocytes Absolute: 2.7 10*3/uL (ref 0.7–3.1)
Lymphs: 36 %
MCH: 27.9 pg (ref 26.6–33.0)
MCHC: 33.7 g/dL (ref 31.5–35.7)
MCV: 83 fL (ref 79–97)
Monocytes Absolute: 0.4 10*3/uL (ref 0.1–0.9)
Monocytes: 5 %
Neutrophils Absolute: 4.2 10*3/uL (ref 1.4–7.0)
Neutrophils: 57 %
Platelets: 351 10*3/uL (ref 150–379)
RBC: 5.01 x10E6/uL (ref 3.77–5.28)
RDW: 15.8 % — ABNORMAL HIGH (ref 12.3–15.4)
WBC: 7.5 10*3/uL (ref 3.4–10.8)

## 2015-11-22 LAB — LIPID PANEL
Chol/HDL Ratio: 2.4 ratio units (ref 0.0–4.4)
Cholesterol, Total: 146 mg/dL (ref 100–199)
HDL: 62 mg/dL (ref >39)
LDL Calculated: 61 mg/dL (ref 0–99)
Triglycerides: 117 mg/dL (ref 0–149)
VLDL Cholesterol Cal: 23 mg/dL (ref 5–40)

## 2015-11-22 LAB — COMPREHENSIVE METABOLIC PANEL
ALT: 17 IU/L (ref 0–32)
AST: 16 IU/L (ref 0–40)
Albumin/Globulin Ratio: 1.7 (ref 1.1–2.5)
Albumin: 4.6 g/dL (ref 3.6–4.8)
Alkaline Phosphatase: 95 IU/L (ref 39–117)
BUN/Creatinine Ratio: 25 (ref 11–26)
BUN: 24 mg/dL (ref 8–27)
Bilirubin Total: 0.4 mg/dL (ref 0.0–1.2)
CO2: 26 mmol/L (ref 18–29)
Calcium: 10.4 mg/dL — ABNORMAL HIGH (ref 8.7–10.3)
Chloride: 98 mmol/L (ref 96–106)
Creatinine, Ser: 0.96 mg/dL (ref 0.57–1.00)
GFR calc Af Amer: 70 mL/min/{1.73_m2} (ref >59)
GFR calc non Af Amer: 61 mL/min/{1.73_m2} (ref >59)
Globulin, Total: 2.7 g/dL (ref 1.5–4.5)
Glucose: 154 mg/dL — ABNORMAL HIGH (ref 65–99)
Potassium: 4.9 mmol/L (ref 3.5–5.2)
Sodium: 141 mmol/L (ref 134–144)
Total Protein: 7.3 g/dL (ref 6.0–8.5)

## 2015-11-22 LAB — VITAMIN D 25 HYDROXY (VIT D DEFICIENCY, FRACTURES): Vit D, 25-Hydroxy: 36.1 ng/mL (ref 30.0–100.0)

## 2015-11-23 ENCOUNTER — Encounter: Payer: Self-pay | Admitting: Family Medicine

## 2015-11-25 ENCOUNTER — Telehealth: Payer: Self-pay

## 2015-11-25 NOTE — Telephone Encounter (Signed)
-----   Message from Steele Sizer, MD sent at 11/23/2015  3:17 PM EST ----- No anemia, normal WBC normal platelets Glucose is above goal, should be below 140 fasting, normal kidney and liver function test, vitamin D and lipid panel also at goal. Continue all medications Calcium is elevated, we will recheck next visit with ionized calcium, depending on results we may stop HCTZ

## 2015-11-25 NOTE — Telephone Encounter (Signed)
Left vm for patient to call back for lab results.

## 2015-12-04 DIAGNOSIS — Z96651 Presence of right artificial knee joint: Secondary | ICD-10-CM | POA: Diagnosis not present

## 2015-12-04 DIAGNOSIS — Z96653 Presence of artificial knee joint, bilateral: Secondary | ICD-10-CM | POA: Diagnosis not present

## 2015-12-04 DIAGNOSIS — Z96652 Presence of left artificial knee joint: Secondary | ICD-10-CM | POA: Diagnosis not present

## 2015-12-04 DIAGNOSIS — Z471 Aftercare following joint replacement surgery: Secondary | ICD-10-CM | POA: Diagnosis not present

## 2015-12-04 DIAGNOSIS — S82102D Unspecified fracture of upper end of left tibia, subsequent encounter for closed fracture with routine healing: Secondary | ICD-10-CM | POA: Diagnosis not present

## 2015-12-23 DIAGNOSIS — M5416 Radiculopathy, lumbar region: Secondary | ICD-10-CM | POA: Diagnosis not present

## 2015-12-23 DIAGNOSIS — M4806 Spinal stenosis, lumbar region: Secondary | ICD-10-CM | POA: Diagnosis not present

## 2015-12-23 DIAGNOSIS — M5030 Other cervical disc degeneration, unspecified cervical region: Secondary | ICD-10-CM | POA: Diagnosis not present

## 2015-12-23 DIAGNOSIS — M7542 Impingement syndrome of left shoulder: Secondary | ICD-10-CM | POA: Diagnosis not present

## 2015-12-23 DIAGNOSIS — M5136 Other intervertebral disc degeneration, lumbar region: Secondary | ICD-10-CM | POA: Diagnosis not present

## 2015-12-23 DIAGNOSIS — M5412 Radiculopathy, cervical region: Secondary | ICD-10-CM | POA: Diagnosis not present

## 2015-12-30 DIAGNOSIS — M7542 Impingement syndrome of left shoulder: Secondary | ICD-10-CM | POA: Diagnosis not present

## 2016-01-02 DIAGNOSIS — M7542 Impingement syndrome of left shoulder: Secondary | ICD-10-CM | POA: Diagnosis not present

## 2016-01-05 DIAGNOSIS — M7542 Impingement syndrome of left shoulder: Secondary | ICD-10-CM | POA: Diagnosis not present

## 2016-01-08 DIAGNOSIS — M7542 Impingement syndrome of left shoulder: Secondary | ICD-10-CM | POA: Diagnosis not present

## 2016-01-12 DIAGNOSIS — M7542 Impingement syndrome of left shoulder: Secondary | ICD-10-CM | POA: Diagnosis not present

## 2016-01-15 DIAGNOSIS — M7542 Impingement syndrome of left shoulder: Secondary | ICD-10-CM | POA: Diagnosis not present

## 2016-01-19 DIAGNOSIS — M7542 Impingement syndrome of left shoulder: Secondary | ICD-10-CM | POA: Diagnosis not present

## 2016-01-22 DIAGNOSIS — M7542 Impingement syndrome of left shoulder: Secondary | ICD-10-CM | POA: Diagnosis not present

## 2016-01-24 ENCOUNTER — Other Ambulatory Visit: Payer: Self-pay | Admitting: Family Medicine

## 2016-01-26 DIAGNOSIS — M7542 Impingement syndrome of left shoulder: Secondary | ICD-10-CM | POA: Diagnosis not present

## 2016-01-29 DIAGNOSIS — M7542 Impingement syndrome of left shoulder: Secondary | ICD-10-CM | POA: Diagnosis not present

## 2016-02-02 DIAGNOSIS — M7542 Impingement syndrome of left shoulder: Secondary | ICD-10-CM | POA: Diagnosis not present

## 2016-02-05 DIAGNOSIS — M7542 Impingement syndrome of left shoulder: Secondary | ICD-10-CM | POA: Diagnosis not present

## 2016-02-09 DIAGNOSIS — M7542 Impingement syndrome of left shoulder: Secondary | ICD-10-CM | POA: Diagnosis not present

## 2016-02-12 DIAGNOSIS — M7542 Impingement syndrome of left shoulder: Secondary | ICD-10-CM | POA: Diagnosis not present

## 2016-02-16 DIAGNOSIS — M7542 Impingement syndrome of left shoulder: Secondary | ICD-10-CM | POA: Diagnosis not present

## 2016-02-19 DIAGNOSIS — M7542 Impingement syndrome of left shoulder: Secondary | ICD-10-CM | POA: Diagnosis not present

## 2016-02-23 ENCOUNTER — Other Ambulatory Visit: Payer: Self-pay | Admitting: Physical Medicine and Rehabilitation

## 2016-02-23 DIAGNOSIS — M4806 Spinal stenosis, lumbar region: Secondary | ICD-10-CM | POA: Diagnosis not present

## 2016-02-23 DIAGNOSIS — M5416 Radiculopathy, lumbar region: Secondary | ICD-10-CM | POA: Diagnosis not present

## 2016-02-23 DIAGNOSIS — M5136 Other intervertebral disc degeneration, lumbar region: Secondary | ICD-10-CM | POA: Diagnosis not present

## 2016-02-23 DIAGNOSIS — M5030 Other cervical disc degeneration, unspecified cervical region: Secondary | ICD-10-CM | POA: Diagnosis not present

## 2016-02-23 DIAGNOSIS — M7542 Impingement syndrome of left shoulder: Secondary | ICD-10-CM

## 2016-02-23 DIAGNOSIS — M5412 Radiculopathy, cervical region: Secondary | ICD-10-CM | POA: Diagnosis not present

## 2016-03-12 ENCOUNTER — Ambulatory Visit
Admission: RE | Admit: 2016-03-12 | Discharge: 2016-03-12 | Disposition: A | Discharge status: Home / Self Care | Payer: PPO | Source: Ambulatory Visit | Attending: Physical Medicine and Rehabilitation | Admitting: Physical Medicine and Rehabilitation

## 2016-03-12 DIAGNOSIS — M19012 Primary osteoarthritis, left shoulder: Secondary | ICD-10-CM | POA: Insufficient documentation

## 2016-03-12 DIAGNOSIS — M7542 Impingement syndrome of left shoulder: Secondary | ICD-10-CM | POA: Insufficient documentation

## 2016-03-12 DIAGNOSIS — M75102 Unspecified rotator cuff tear or rupture of left shoulder, not specified as traumatic: Secondary | ICD-10-CM | POA: Diagnosis not present

## 2016-03-12 DIAGNOSIS — M12812 Other specific arthropathies, not elsewhere classified, left shoulder: Secondary | ICD-10-CM | POA: Diagnosis not present

## 2016-03-12 DIAGNOSIS — M25512 Pain in left shoulder: Secondary | ICD-10-CM | POA: Diagnosis not present

## 2016-03-15 ENCOUNTER — Encounter: Payer: Self-pay | Admitting: Family Medicine

## 2016-03-15 ENCOUNTER — Ambulatory Visit (INDEPENDENT_AMBULATORY_CARE_PROVIDER_SITE_OTHER): Payer: PPO | Admitting: Family Medicine

## 2016-03-15 VITALS — BP 114/68 | HR 88 | Temp 97.9°F | Resp 18 | Ht 63.0 in | Wt 232.0 lb

## 2016-03-15 DIAGNOSIS — J309 Allergic rhinitis, unspecified: Secondary | ICD-10-CM

## 2016-03-15 DIAGNOSIS — M153 Secondary multiple arthritis: Secondary | ICD-10-CM

## 2016-03-15 DIAGNOSIS — G4700 Insomnia, unspecified: Secondary | ICD-10-CM

## 2016-03-15 DIAGNOSIS — J42 Unspecified chronic bronchitis: Secondary | ICD-10-CM

## 2016-03-15 DIAGNOSIS — N182 Chronic kidney disease, stage 2 (mild): Secondary | ICD-10-CM

## 2016-03-15 DIAGNOSIS — E1122 Type 2 diabetes mellitus with diabetic chronic kidney disease: Secondary | ICD-10-CM | POA: Diagnosis not present

## 2016-03-15 DIAGNOSIS — E785 Hyperlipidemia, unspecified: Secondary | ICD-10-CM | POA: Diagnosis not present

## 2016-03-15 DIAGNOSIS — I10 Essential (primary) hypertension: Secondary | ICD-10-CM

## 2016-03-15 DIAGNOSIS — M75102 Unspecified rotator cuff tear or rupture of left shoulder, not specified as traumatic: Secondary | ICD-10-CM | POA: Diagnosis not present

## 2016-03-15 DIAGNOSIS — J209 Acute bronchitis, unspecified: Secondary | ICD-10-CM | POA: Diagnosis not present

## 2016-03-15 DIAGNOSIS — E1165 Type 2 diabetes mellitus with hyperglycemia: Secondary | ICD-10-CM

## 2016-03-15 DIAGNOSIS — J3089 Other allergic rhinitis: Secondary | ICD-10-CM

## 2016-03-15 LAB — POCT GLYCOSYLATED HEMOGLOBIN (HGB A1C): Hemoglobin A1C: 8.6

## 2016-03-15 MED ORDER — TRAZODONE HCL 50 MG PO TABS: 25.0000 mg | ORAL_TABLET | Freq: Every evening | ORAL | Status: DC | PRN

## 2016-03-15 MED ORDER — METFORMIN HCL 1000 MG PO TABS: 1000.0000 mg | ORAL_TABLET | Freq: Two times a day (BID) | ORAL | Status: DC

## 2016-03-15 MED ORDER — FLUTICASONE PROPIONATE 50 MCG/ACT NA SUSP: 2.0000 | Freq: Every day | NASAL | Status: DC

## 2016-03-15 MED ORDER — ATORVASTATIN CALCIUM 80 MG PO TABS: 80.0000 mg | ORAL_TABLET | Freq: Every day | ORAL | Status: DC

## 2016-03-15 MED ORDER — LISINOPRIL 40 MG PO TABS: 40.0000 mg | ORAL_TABLET | Freq: Two times a day (BID) | ORAL | Status: DC

## 2016-03-15 MED ORDER — CELECOXIB 200 MG PO CAPS: 200.0000 mg | ORAL_CAPSULE | Freq: Every day | ORAL | Status: DC

## 2016-03-15 MED ORDER — FLUTICASONE-SALMETEROL 100-50 MCG/DOSE IN AEPB: 1.0000 | INHALATION_SPRAY | Freq: Two times a day (BID) | RESPIRATORY_TRACT | Status: DC

## 2016-03-15 MED ORDER — AMOXICILLIN-POT CLAVULANATE 875-125 MG PO TABS: 1.0000 | ORAL_TABLET | Freq: Two times a day (BID) | ORAL | Status: DC

## 2016-03-15 MED ORDER — HYDROCHLOROTHIAZIDE 25 MG PO TABS: 25.0000 mg | ORAL_TABLET | Freq: Every day | ORAL | Status: DC

## 2016-03-15 MED ORDER — LORCASERIN HCL ER 20 MG PO TB24: 1.0000 | ORAL_TABLET | Freq: Every day | ORAL | Status: DC

## 2016-03-15 MED ORDER — TRAMADOL HCL 50 MG PO TABS: 50.0000 mg | ORAL_TABLET | Freq: Four times a day (QID) | ORAL | Status: DC | PRN

## 2016-03-15 NOTE — Addendum Note (Signed)
Addended by: Steele Sizer F on: 03/15/2016 10:34 AM   Modules accepted: Orders

## 2016-03-15 NOTE — Progress Notes (Addendum)
Name: Cindy Griffin   MRN: [id number]    DOB: 07/03/1946   Date:03/15/2016       Progress Note  Subjective  Chief Complaint  Chief Complaint  Patient presents with  . Medication Refill    4 month F/U  . Diabetes    Checks BS at home twice day, Low-140 High-180  . Cough    Onset-3 weeks, unchanged, yellow mucus, chest congestion, nasal congestion, sob, bilateral ear pressure and pain, sinus headaches, sinus pressure. Uses cough drops for relief, sinus tablets, inhaler and nasal spray-with some relief.  . Knee Pain    Onset-yesterday when wrestling granddaughter she hurt her left knee and having edema.   . Back Pain    Constant pain in lower back, has a bulging disc  . Hypertension    Headaches, dizziness    HPI    DMII with nephropathy: she is taking medications as prescribed, denies side effects. She has polyphagia- and likes to eat bread and sweets - she has not been compliant with her diet lately and hgbA1C has gone up from 7.5% to 8.1% and is now 8.6% She denies polydipsia or polyuria. FSB average 140's . She states she will resume her diet and exercise more to get it to goal, she does not want to change medication at this time.   HTN: taking medication and denies side effects of medication. No chest pain or palpitation  Hyperlipidemia: taking Atorvastatin and denies side effects of medications  Cervical and lumbar radiculitis: seeing Dr. Phyllis Ginger, had a Left L4-5 transforaminal epidural injection, she also had PT. Had MRI of shoulder and is waiting for results. Symptoms of left shoulder is still bothersome, taking Tramadol prn   Insomnia: she has difficulty falling and staying asleep, discussed taking Trazodone every night, also to have a better sleep hygiene. Go to be at the same time and getting up after 8 hours. She has not followed recommendations because she does not have enough hours to sleep   Morbid Obesity: she tried Contrave but made her vomit. She goes to  gym twice or three times weekly. She states at night she feels very hungry and eats a lot after dinner. She has also been eating sweets, she gained weight since last visit She is willing to try Belviq  Chronic Bronchitis: she states that for the past few weeks she has been coughing more than usual, she also noticed some wheezing and increase in SOB, no fever, she has been taking Advair twice daily. She refuses to take prednisone by mouth.   Patient Active Problem List   Diagnosis Date Noted  . Hypercalcemia 11/23/2015  . DDD (degenerative disc disease), lumbar 07/28/2015  . Spinal stenosis at L4-L5 level 06/12/2015  . Benign essential HTN 05/25/2015  . Bunion 05/25/2015  . Cardiac enlargement 05/25/2015  . Cervical radicular pain 05/25/2015  . Back pain, chronic 05/25/2015  . Osteoarthritis 05/25/2015  . Dyslipidemia 05/25/2015  . Gastric reflux 05/25/2015  . Insomnia 05/25/2015  . Eczema intertrigo 05/25/2015  . Bronchitis, chronic, mucopurulent ([place]) 05/25/2015  . Numerous moles 05/25/2015  . Morbid obesity ([place]) 05/25/2015  . Perennial allergic rhinitis 05/25/2015  . Elevated platelet count ([place]) 05/25/2015  . DM (diabetes mellitus), type 2, uncontrolled, with renal complications ([place]) 16/55/3748  . Snores 05/25/2015  . History of artificial joint 05/25/2015  . Degeneration of intervertebral disc of cervical region 02/06/2015  . Neuritis or radiculitis due to rupture of lumbar intervertebral disc 01/14/2015  . Abnormal presence of  protein in urine 05/04/2010  . Vitamin D deficiency 05/04/2010    Past Surgical History  Procedure Laterality Date  . Colonoscopy N/A 04/11/2015    Procedure: COLONOSCOPY;  Surgeon: Hulen Luster, MD;  Location: [facility] ENDOSCOPY;  Service: Gastroenterology;  Laterality: N/A;  . Abdominal hysterectomy      due to cancer-partial  . Joint replacement      left knee 2013 right knee 2005  . Spinal fusion      C-Spine  . Breast biopsy Right     Benign  .  Transforaminal epidural  02/20/2015    Injection into cervical spine- C5-6 Dr. Phyllis Ginger    Family History  Problem Relation Age of Onset  . Diabetes Mother   . Kidney disease Mother   . Hypertension Mother   . Asthma Daughter   . Diabetes Sister   . Diabetes Brother     Social History   Social History  . Marital Status: Widowed    Spouse Name: N/A  . Number of Children: N/A  . Years of Education: N/A   Occupational History  . Not on file.   Social History Main Topics  . Smoking status: Never Smoker   . Smokeless tobacco: Never Used  . Alcohol Use: 0.0 oz/week    0 Standard drinks or equivalent per week     Comment: occasionally  . Drug Use: No  . Sexual Activity: Not Currently   Other Topics Concern  . Not on file   Social History Narrative     Current outpatient prescriptions:  .  acetaminophen (TYLENOL) 500 MG tablet, Take 1,000 mg by mouth every 6 (six) hours as needed (pain)., Disp: , Rfl:  .  aspirin EC 81 MG tablet, Take 81 mg by mouth daily., Disp: , Rfl:  .  atorvastatin (LIPITOR) 80 MG tablet, Take 1 tablet (80 mg total) by mouth daily., Disp: 90 tablet, Rfl: 1 .  Blood Glucose Monitoring Suppl (ACCU-CHEK NANO SMARTVIEW) W/DEVICE KIT, 1 Device by Does not apply route 3 (three) times daily., Disp: 1 kit, Rfl: 0 .  canagliflozin (INVOKANA) 300 MG TABS tablet, Take 300 mg by mouth daily before breakfast., Disp: 90 tablet, Rfl: 1 .  celecoxib (CELEBREX) 200 MG capsule, Take 1 capsule (200 mg total) by mouth daily., Disp: 90 capsule, Rfl: 1 .  cetirizine (ZYRTEC) 10 MG tablet, Take 1 tablet by mouth daily., Disp: , Rfl:  .  ferrous sulfate 325 (65 FE) MG EC tablet, Take 1 tablet by mouth daily., Disp: , Rfl:  .  fluticasone (FLONASE) 50 MCG/ACT nasal spray, Place 2 sprays into both nostrils daily., Disp: 16 g, Rfl: 6 .  Fluticasone-Salmeterol (ADVAIR) 100-50 MCG/DOSE AEPB, Inhale 1 puff into the lungs 2 (two) times daily., Disp: 60 each, Rfl: 5 .  glipiZIDE  (GLUCOTROL) 5 MG tablet, Take 1 tablet (5 mg total) by mouth 2 (two) times daily before a meal., Disp: 180 tablet, Rfl: 1 .  glucose blood (ADVOCATE REDI-CODE) test strip, 1 each by Other route QID., Disp: 100 each, Rfl: 12 .  hydrochlorothiazide (HYDRODIURIL) 25 MG tablet, Take 1 tablet (25 mg total) by mouth daily., Disp: 90 tablet, Rfl: 1 .  lisinopril (PRINIVIL,ZESTRIL) 40 MG tablet, Take 1 tablet (40 mg total) by mouth 2 (two) times daily., Disp: 180 tablet, Rfl: 1 .  metFORMIN (GLUCOPHAGE) 1000 MG tablet, Take 1 tablet (1,000 mg total) by mouth 2 (two) times daily with a meal., Disp: 180 tablet, Rfl: 1 .  Multiple Vitamins-Minerals (  MULTIVITAMIN PO), Take 1 tablet by mouth daily., Disp: , Rfl:  .  Omega-3 Fatty Acids (FISH OIL BURP-LESS) 500 MG CAPS, Take by mouth., Disp: , Rfl:  .  omeprazole (PRILOSEC) 20 MG capsule, Take 1 capsule (20 mg total) by mouth daily., Disp: 90 capsule, Rfl: 4 .  tiZANidine (ZANAFLEX) 2 MG tablet, , Disp: , Rfl:  .  traMADol (ULTRAM) 50 MG tablet, Take 1 tablet (50 mg total) by mouth every 6 (six) hours as needed. for pain, Disp: 90 tablet, Rfl: 0 .  traZODone (DESYREL) 50 MG tablet, Take 0.5-1 tablets (25-50 mg total) by mouth at bedtime as needed for sleep., Disp: 90 tablet, Rfl: 1 .  Vitamins A & D (VITAMIN A & D) 10000-400 UNITS TABS, Take by mouth., Disp: , Rfl:   No Known Allergies   ROS  Constitutional: Negative for fever , positive for  weight change.  Respiratory: Positive  for cough and shortness of breath.   Cardiovascular: Negative for chest pain or palpitations.  Gastrointestinal: Negative for abdominal pain, no bowel changes.  Musculoskeletal: Negative for gait problem , positive for  joint swelling.  Skin: Negative for rash.  Neurological: Negative for dizziness or headache.  No other specific complaints in a complete review of systems (except as listed in HPI above).  Objective  Filed Vitals:   03/15/16 0945  BP: 114/68  Pulse: 88   Temp: 97.9 F (36.6 C)  TempSrc: Oral  Resp: 18  Height: 5' 3" (1.6 m)  Weight: 232 lb (105.235 kg)  SpO2: 98%    Body mass index is 41.11 kg/(m^2).  Physical Exam  Constitutional: Patient appears well-developed and well-nourished. Obese No distress.  HEENT: head atraumatic, normocephalic, pupils equal and reactive to light,neck supple, throat within normal limits Cardiovascular: Normal rate, regular rhythm and normal heart sounds.  No murmur heard.Trace  BLE edema. Pulmonary/Chest: Effort normal and breath sounds shows some scattered on both lungs. No respiratory distress. Abdominal: Soft.  There is no tenderness. Psychiatric: Patient has a normal mood and affect. behavior is normal. Judgment and thought content normal. Muscular Skeletal: she has mild effusion of left knee  Recent Results (from the past 2160 hour(s))  POCT HgB A1C     Status: Abnormal   Collection Time: 03/15/16 10:01 AM  Result Value Ref Range   Hemoglobin A1C 8.6      PHQ2/9: Depression screen [facility] 2/9 03/15/2016 11/21/2015 08/29/2015 05/29/2015  Decreased Interest 0 0 0 0  Down, Depressed, Hopeless 0 0 1 0  PHQ - 2 Score 0 0 1 0     Fall Risk: Fall Risk  03/15/2016 11/21/2015 08/29/2015 05/29/2015  Falls in the past year? Yes No No No  Number falls in past yr: 1 - - -  Injury with Fall? Yes - - -      Functional Status Survey: Is the patient deaf or have difficulty hearing?: No Does the patient have difficulty seeing, even when wearing glasses/contacts?: No Does the patient have difficulty concentrating, remembering, or making decisions?: No Does the patient have difficulty walking or climbing stairs?: No Does the patient have difficulty dressing or bathing?: No Does the patient have difficulty doing errands alone such as visiting a doctor's office or shopping?: No    Assessment & Plan  1. Uncontrolled type 2 diabetes mellitus with stage 2 chronic kidney disease, unspecified long term insulin  use status ([place])  She needs to resume her diet - POCT HgB A1C - lisinopril (PRINIVIL,ZESTRIL) 40 MG  tablet; Take 1 tablet (40 mg total) by mouth 2 (two) times daily.  Dispense: 180 tablet; Refill: 1 - metFORMIN (GLUCOPHAGE) 1000 MG tablet; Take 1 tablet (1,000 mg total) by mouth 2 (two) times daily with a meal.  Dispense: 180 tablet; Refill: 1  2. Chronic bronchitis with acute exacerbation ([place])  She can also try Mucinex otc - Fluticasone-Salmeterol (ADVAIR) 100-50 MCG/DOSE AEPB; Inhale 1 puff into the lungs 2 (two) times daily.  Dispense: 60 each; Refill: 5 - amoxicillin-clavulanate (AUGMENTIN) 875-125 MG tablet; Take 1 tablet by mouth 2 (two) times daily.  Dispense: 14 tablet; Refill: 0  3. Insomnia  - traZODone (DESYREL) 50 MG tablet; Take 0.5-1 tablets (25-50 mg total) by mouth at bedtime as needed for sleep.  Dispense: 90 tablet; Refill: 1  4. Benign essential HTN  - hydrochlorothiazide (HYDRODIURIL) 25 MG tablet; Take 1 tablet (25 mg total) by mouth daily.  Dispense: 90 tablet; Refill: 1 - lisinopril (PRINIVIL,ZESTRIL) 40 MG tablet; Take 1 tablet (40 mg total) by mouth 2 (two) times daily.  Dispense: 180 tablet; Refill: 1  5. Rotator cuff syndrome of left shoulder  Continue follow up with Dr. Garen Grams  6. Dyslipidemia  - atorvastatin (LIPITOR) 80 MG tablet; Take 1 tablet (80 mg total) by mouth daily.  Dispense: 90 tablet; Refill: 1  7. Perennial allergic rhinitis  - fluticasone (FLONASE) 50 MCG/ACT nasal spray; Place 2 sprays into both nostrils daily.  Dispense: 16 g; Refill: 6  8. Secondary osteoarthritis of multiple sites  - celecoxib (CELEBREX) 200 MG capsule; Take 1 capsule (200 mg total) by mouth daily.  Dispense: 90 capsule; Refill: 1 - traMADol (ULTRAM) 50 MG tablet; Take 1 tablet (50 mg total) by mouth every 6 (six) hours as needed. for pain  Dispense: 90 tablet; Refill: 0   9. Morbid obesity due to excess calories ([facility]CC)  - Lorcaserin HCl ER (BELVIQ XR) 20 MG  TB24; Take 1 tablet by mouth daily.  Dispense: 30 tablet; Refill: 2

## 2016-03-23 ENCOUNTER — Ambulatory Visit: Payer: Commercial Managed Care - HMO | Admitting: Family Medicine

## 2016-03-23 DIAGNOSIS — M4806 Spinal stenosis, lumbar region: Secondary | ICD-10-CM | POA: Diagnosis not present

## 2016-03-23 DIAGNOSIS — M7542 Impingement syndrome of left shoulder: Secondary | ICD-10-CM | POA: Diagnosis not present

## 2016-03-23 DIAGNOSIS — M5030 Other cervical disc degeneration, unspecified cervical region: Secondary | ICD-10-CM | POA: Diagnosis not present

## 2016-03-23 DIAGNOSIS — M5136 Other intervertebral disc degeneration, lumbar region: Secondary | ICD-10-CM | POA: Diagnosis not present

## 2016-03-23 DIAGNOSIS — M5416 Radiculopathy, lumbar region: Secondary | ICD-10-CM | POA: Diagnosis not present

## 2016-03-23 DIAGNOSIS — M5412 Radiculopathy, cervical region: Secondary | ICD-10-CM | POA: Diagnosis not present

## 2016-03-23 DIAGNOSIS — M75102 Unspecified rotator cuff tear or rupture of left shoulder, not specified as traumatic: Secondary | ICD-10-CM | POA: Diagnosis not present

## 2016-04-12 DIAGNOSIS — M19012 Primary osteoarthritis, left shoulder: Secondary | ICD-10-CM | POA: Diagnosis not present

## 2016-04-12 DIAGNOSIS — M7582 Other shoulder lesions, left shoulder: Secondary | ICD-10-CM | POA: Diagnosis not present

## 2016-04-12 DIAGNOSIS — M75122 Complete rotator cuff tear or rupture of left shoulder, not specified as traumatic: Secondary | ICD-10-CM | POA: Diagnosis not present

## 2016-05-10 ENCOUNTER — Other Ambulatory Visit: Payer: PPO

## 2016-05-11 ENCOUNTER — Encounter
Admission: RE | Admit: 2016-05-11 | Discharge: 2016-05-11 | Disposition: A | Discharge status: Home / Self Care | Payer: PPO | Source: Ambulatory Visit | Attending: Surgery | Admitting: Surgery

## 2016-05-11 DIAGNOSIS — Z01812 Encounter for preprocedural laboratory examination: Secondary | ICD-10-CM | POA: Insufficient documentation

## 2016-05-11 DIAGNOSIS — Z01810 Encounter for preprocedural cardiovascular examination: Secondary | ICD-10-CM | POA: Insufficient documentation

## 2016-05-11 DIAGNOSIS — I10 Essential (primary) hypertension: Secondary | ICD-10-CM | POA: Diagnosis not present

## 2016-05-11 LAB — BASIC METABOLIC PANEL
Anion gap: 9 (ref 5–15)
BUN: 19 mg/dL (ref 6–20)
CO2: 27 mmol/L (ref 22–32)
Calcium: 9.9 mg/dL (ref 8.9–10.3)
Chloride: 103 mmol/L (ref 101–111)
Creatinine, Ser: 0.96 mg/dL (ref 0.44–1.00)
GFR calc Af Amer: >60 mL/min (ref >60)
GFR calc non Af Amer: 59 mL/min — ABNORMAL LOW (ref >60)
Glucose, Bld: 165 mg/dL — ABNORMAL HIGH (ref 65–99)
Potassium: 4.2 mmol/L (ref 3.5–5.1)
Sodium: 139 mmol/L (ref 135–145)

## 2016-05-11 LAB — CBC
HCT: 40.2 % (ref 35.0–47.0)
Hemoglobin: 13 g/dL (ref 12.0–16.0)
MCH: 27.6 pg (ref 26.0–34.0)
MCHC: 32.2 g/dL (ref 32.0–36.0)
MCV: 85.7 fL (ref 80.0–100.0)
Platelets: 294 10*3/uL (ref 150–440)
RBC: 4.7 MIL/uL (ref 3.80–5.20)
RDW: 15.7 % — ABNORMAL HIGH (ref 11.5–14.5)
WBC: 7.6 10*3/uL (ref 3.6–11.0)

## 2016-05-11 NOTE — Patient Instructions (Addendum)
Your procedure is scheduled on: Tuesday 05/18/16 Report to Day Surgery. 2ND FLOOR MEDICAL MALL ENTRANCE To find out your arrival time please call [phone] between 1PM - 3PM on Monday 05/17/16.  Remember: Instructions that are not followed completely may result in serious medical risk, up to and including death, or upon the discretion of your surgeon and anesthesiologist your surgery may need to be rescheduled.    __X__ 1. Do not eat food or drink liquids after midnight. No gum chewing or hard candies.     __X__ 2. No Alcohol for 24 hours before or after surgery.   ____ 3. Bring all medications with you on the day of surgery if instructed.    __X__ 4. Notify your doctor if there is any change in your medical condition     (cold, fever, infections).     Do not wear jewelry, make-up, hairpins, clips or nail polish.  Do not wear lotions, powders, or perfumes.   Do not shave 48 hours prior to surgery. Men may shave face and neck.  Do not bring valuables to the hospital.    [facility] is not responsible for any belongings or valuables.               Contacts, dentures or bridgework may not be worn into surgery.  Leave your suitcase in the car. After surgery it may be brought to your room.  For patients admitted to the hospital, discharge time is determined by your                treatment team.   Patients discharged the day of surgery will not be allowed to drive home.   Please read over the following fact sheets that you were given:   Surgical Site Infection Prevention   __X__ Take these medicines the morning of surgery with A SIP OF WATER:    1. CETIRIZINE  2. LISINOPRIL  3. OMEPRAZOLE  4. TRAMADOL IF NEEDED  5.  6.  ____ Fleet Enema (as directed)   __X__ Use CHG Soap as directed  __X__ Use inhalers on the day of surgery  __X__ Stop metformin 2 days prior to surgery    ____ Take 1/2 of usual insulin dose the night before surgery and none on the morning of surgery.    __X__ Stop Coumadin/Plavix/aspirin on 1 WEEK BEFORE SURGERY  ____ Stop Anti-inflammatories on    __X__ Stop supplements until after surgery.    ____ Bring C-Pap to the hospital.

## 2016-05-18 ENCOUNTER — Ambulatory Visit
Admission: RE | Admit: 2016-05-18 | Discharge: 2016-05-18 | Disposition: A | Discharge status: Home / Self Care | Payer: PPO | Source: Ambulatory Visit | Attending: Surgery | Admitting: Surgery

## 2016-05-18 ENCOUNTER — Ambulatory Visit: Payer: PPO | Admitting: Anesthesiology

## 2016-05-18 ENCOUNTER — Encounter
Admission: RE | Disposition: A | Discharge status: Home / Self Care | Payer: Self-pay | Source: Ambulatory Visit | Attending: Surgery

## 2016-05-18 ENCOUNTER — Encounter: Payer: Self-pay | Admitting: *Deleted

## 2016-05-18 DIAGNOSIS — M25812 Other specified joint disorders, left shoulder: Secondary | ICD-10-CM | POA: Diagnosis not present

## 2016-05-18 DIAGNOSIS — Z833 Family history of diabetes mellitus: Secondary | ICD-10-CM | POA: Diagnosis not present

## 2016-05-18 DIAGNOSIS — G8929 Other chronic pain: Secondary | ICD-10-CM | POA: Insufficient documentation

## 2016-05-18 DIAGNOSIS — Z841 Family history of disorders of kidney and ureter: Secondary | ICD-10-CM | POA: Insufficient documentation

## 2016-05-18 DIAGNOSIS — Z7982 Long term (current) use of aspirin: Secondary | ICD-10-CM | POA: Diagnosis not present

## 2016-05-18 DIAGNOSIS — X500XXA Overexertion from strenuous movement or load, initial encounter: Secondary | ICD-10-CM | POA: Diagnosis not present

## 2016-05-18 DIAGNOSIS — X509XXA Other and unspecified overexertion or strenuous movements or postures, initial encounter: Secondary | ICD-10-CM | POA: Diagnosis not present

## 2016-05-18 DIAGNOSIS — I10 Essential (primary) hypertension: Secondary | ICD-10-CM | POA: Insufficient documentation

## 2016-05-18 DIAGNOSIS — Z79899 Other long term (current) drug therapy: Secondary | ICD-10-CM | POA: Insufficient documentation

## 2016-05-18 DIAGNOSIS — Z981 Arthrodesis status: Secondary | ICD-10-CM | POA: Diagnosis not present

## 2016-05-18 DIAGNOSIS — M24112 Other articular cartilage disorders, left shoulder: Secondary | ICD-10-CM | POA: Diagnosis not present

## 2016-05-18 DIAGNOSIS — S43432A Superior glenoid labrum lesion of left shoulder, initial encounter: Secondary | ICD-10-CM | POA: Insufficient documentation

## 2016-05-18 DIAGNOSIS — Y939 Activity, unspecified: Secondary | ICD-10-CM | POA: Diagnosis not present

## 2016-05-18 DIAGNOSIS — M7522 Bicipital tendinitis, left shoulder: Secondary | ICD-10-CM | POA: Diagnosis not present

## 2016-05-18 DIAGNOSIS — Z7984 Long term (current) use of oral hypoglycemic drugs: Secondary | ICD-10-CM | POA: Diagnosis not present

## 2016-05-18 DIAGNOSIS — Z5333 Arthroscopic surgical procedure converted to open procedure: Secondary | ICD-10-CM | POA: Diagnosis not present

## 2016-05-18 DIAGNOSIS — M19012 Primary osteoarthritis, left shoulder: Secondary | ICD-10-CM | POA: Diagnosis not present

## 2016-05-18 DIAGNOSIS — R0683 Snoring: Secondary | ICD-10-CM | POA: Insufficient documentation

## 2016-05-18 DIAGNOSIS — Z8249 Family history of ischemic heart disease and other diseases of the circulatory system: Secondary | ICD-10-CM | POA: Diagnosis not present

## 2016-05-18 DIAGNOSIS — K219 Gastro-esophageal reflux disease without esophagitis: Secondary | ICD-10-CM | POA: Insufficient documentation

## 2016-05-18 DIAGNOSIS — M75102 Unspecified rotator cuff tear or rupture of left shoulder, not specified as traumatic: Secondary | ICD-10-CM | POA: Insufficient documentation

## 2016-05-18 DIAGNOSIS — M7542 Impingement syndrome of left shoulder: Secondary | ICD-10-CM | POA: Diagnosis not present

## 2016-05-18 DIAGNOSIS — M7582 Other shoulder lesions, left shoulder: Secondary | ICD-10-CM | POA: Diagnosis not present

## 2016-05-18 DIAGNOSIS — Z86010 Personal history of colonic polyps: Secondary | ICD-10-CM | POA: Diagnosis not present

## 2016-05-18 DIAGNOSIS — E785 Hyperlipidemia, unspecified: Secondary | ICD-10-CM | POA: Insufficient documentation

## 2016-05-18 DIAGNOSIS — Z8542 Personal history of malignant neoplasm of other parts of uterus: Secondary | ICD-10-CM | POA: Diagnosis not present

## 2016-05-18 DIAGNOSIS — M75122 Complete rotator cuff tear or rupture of left shoulder, not specified as traumatic: Secondary | ICD-10-CM | POA: Diagnosis not present

## 2016-05-18 DIAGNOSIS — J45909 Unspecified asthma, uncomplicated: Secondary | ICD-10-CM | POA: Diagnosis not present

## 2016-05-18 DIAGNOSIS — E119 Type 2 diabetes mellitus without complications: Secondary | ICD-10-CM | POA: Insufficient documentation

## 2016-05-18 DIAGNOSIS — M1990 Unspecified osteoarthritis, unspecified site: Secondary | ICD-10-CM | POA: Insufficient documentation

## 2016-05-18 DIAGNOSIS — M549 Dorsalgia, unspecified: Secondary | ICD-10-CM | POA: Diagnosis not present

## 2016-05-18 HISTORY — PX: SHOULDER ARTHROSCOPY WITH OPEN ROTATOR CUFF REPAIR: SHX6092

## 2016-05-18 LAB — GLUCOSE, CAPILLARY
Glucose-Capillary: 176 mg/dL — ABNORMAL HIGH (ref 65–99)
Glucose-Capillary: 167 mg/dL — ABNORMAL HIGH (ref 65–99)

## 2016-05-18 SURGERY — ARTHROSCOPY, SHOULDER WITH REPAIR, ROTATOR CUFF, OPEN
Anesthesia: General | Site: Shoulder | Laterality: Left | Wound class: Clean

## 2016-05-18 MED ORDER — ONDANSETRON HCL 4 MG/2ML IJ SOLN
4.0000 mg | Freq: Once | INTRAMUSCULAR | Status: AC | PRN
  Administered 2016-05-18: 4 mg via INTRAVENOUS

## 2016-05-18 MED ORDER — PHENYLEPHRINE HCL 10 MG/ML IJ SOLN
INTRAMUSCULAR | Status: DC | PRN
  Administered 2016-05-18 (×4): 100 ug via INTRAVENOUS

## 2016-05-18 MED ORDER — EPINEPHRINE HCL 1 MG/ML IJ SOLN
INTRAMUSCULAR | Status: AC
  Filled 2016-05-18: qty 1

## 2016-05-18 MED ORDER — OXYCODONE HCL 5 MG PO TABS: 5.0000 mg | ORAL_TABLET | ORAL | Status: DC | PRN

## 2016-05-18 MED ORDER — EPINEPHRINE HCL 1 MG/ML IJ SOLN
INTRAMUSCULAR | Status: DC
Start: 2016-05-18 — End: 2016-05-18
  Filled 2016-05-18: qty 1

## 2016-05-18 MED ORDER — ROCURONIUM BROMIDE 100 MG/10ML IV SOLN
INTRAVENOUS | Status: DC | PRN
  Administered 2016-05-18: 50 mg via INTRAVENOUS

## 2016-05-18 MED ORDER — ONDANSETRON HCL 4 MG PO TABS
4.0000 mg | ORAL_TABLET | Freq: Four times a day (QID) | ORAL | Status: DC | PRN
Start: 2016-05-18 — End: 2016-05-18

## 2016-05-18 MED ORDER — CEFAZOLIN SODIUM-DEXTROSE 2-4 GM/100ML-% IV SOLN
2.0000 g | Freq: Once | INTRAVENOUS | Status: AC
  Administered 2016-05-18: 2 g via INTRAVENOUS

## 2016-05-18 MED ORDER — ROPIVACAINE HCL 5 MG/ML IJ SOLN
INTRAMUSCULAR | Status: AC
  Filled 2016-05-18: qty 40

## 2016-05-18 MED ORDER — LIDOCAINE HCL (CARDIAC) 20 MG/ML IV SOLN
INTRAVENOUS | Status: DC | PRN
  Administered 2016-05-18: 60 mg via INTRAVENOUS

## 2016-05-18 MED ORDER — MIDAZOLAM HCL 5 MG/ML IJ SOLN: 2.0000 mg | Freq: Once | INTRAMUSCULAR | Status: DC

## 2016-05-18 MED ORDER — GLYCOPYRROLATE 0.2 MG/ML IJ SOLN
INTRAMUSCULAR | Status: DC | PRN
  Administered 2016-05-18: .6 mg via INTRAVENOUS

## 2016-05-18 MED ORDER — POTASSIUM CHLORIDE IN NACL 20-0.9 MEQ/L-% IV SOLN: INTRAVENOUS | Status: DC

## 2016-05-18 MED ORDER — MIDAZOLAM HCL 5 MG/5ML IJ SOLN
INTRAMUSCULAR | Status: AC
  Filled 2016-05-18: qty 5

## 2016-05-18 MED ORDER — SODIUM CHLORIDE 0.9 % IV SOLN
INTRAVENOUS | Status: DC
  Administered 2016-05-18: 09:00:00 via INTRAVENOUS

## 2016-05-18 MED ORDER — BUPIVACAINE-EPINEPHRINE (PF) 0.5% -1:200000 IJ SOLN
INTRAMUSCULAR | Status: AC
  Filled 2016-05-18: qty 30

## 2016-05-18 MED ORDER — FENTANYL CITRATE (PF) 100 MCG/2ML IJ SOLN: 25.0000 ug | INTRAMUSCULAR | Status: DC | PRN

## 2016-05-18 MED ORDER — PROPOFOL 10 MG/ML IV BOLUS
INTRAVENOUS | Status: DC | PRN
  Administered 2016-05-18: 160 mg via INTRAVENOUS

## 2016-05-18 MED ORDER — NEOSTIGMINE METHYLSULFATE 10 MG/10ML IV SOLN
INTRAVENOUS | Status: DC | PRN
  Administered 2016-05-18: 5 mg via INTRAVENOUS

## 2016-05-18 MED ORDER — ONDANSETRON HCL 4 MG/2ML IJ SOLN
INTRAMUSCULAR | Status: DC | PRN
  Administered 2016-05-18: 4 mg via INTRAVENOUS

## 2016-05-18 MED ORDER — ONDANSETRON HCL 4 MG/2ML IJ SOLN: 4.0000 mg | Freq: Four times a day (QID) | INTRAMUSCULAR | Status: DC | PRN

## 2016-05-18 MED ORDER — METOCLOPRAMIDE HCL 5 MG/ML IJ SOLN: 5.0000 mg | Freq: Three times a day (TID) | INTRAMUSCULAR | Status: DC | PRN

## 2016-05-18 MED ORDER — ONDANSETRON HCL 4 MG/2ML IJ SOLN
INTRAMUSCULAR | Status: AC
  Filled 2016-05-18: qty 2

## 2016-05-18 MED ORDER — METOCLOPRAMIDE HCL 10 MG PO TABS: 5.0000 mg | ORAL_TABLET | Freq: Three times a day (TID) | ORAL | Status: DC | PRN

## 2016-05-18 MED ORDER — LIDOCAINE HCL (PF) 1 % IJ SOLN
INTRAMUSCULAR | Status: AC
  Filled 2016-05-18: qty 5

## 2016-05-18 MED ORDER — FENTANYL CITRATE (PF) 100 MCG/2ML IJ SOLN
INTRAMUSCULAR | Status: DC | PRN
  Administered 2016-05-18: 50 ug via INTRAVENOUS

## 2016-05-18 MED ORDER — EPINEPHRINE HCL 1 MG/ML IJ SOLN
INTRAMUSCULAR | Status: DC | PRN
  Administered 2016-05-18: 2 mL
  Administered 2016-05-18: 1 mL

## 2016-05-18 MED ORDER — MIDAZOLAM HCL 2 MG/2ML IJ SOLN
INTRAMUSCULAR | Status: DC | PRN
  Administered 2016-05-18: 2 mg via INTRAVENOUS

## 2016-05-18 MED ORDER — BUPIVACAINE-EPINEPHRINE 0.5% -1:200000 IJ SOLN
INTRAMUSCULAR | Status: DC | PRN
  Administered 2016-05-18: 30 mL

## 2016-05-18 MED ORDER — MIDAZOLAM HCL 5 MG/5ML IJ SOLN
2.0000 mg | Freq: Once | INTRAMUSCULAR | Status: AC
  Administered 2016-05-18: 2 mg via INTRAVENOUS
  Filled 2016-05-18: qty 2

## 2016-05-18 SURGICAL SUPPLY — 52 items
ANCH SUT 2 2.9 2 LD TPR NDL (Anchor) ×1 IMPLANT
ANCH SUT 2 2/0 ABS BRD STRL (Anchor) ×1 IMPLANT
ANCH SUT KNTLS STRL SHLDR SYS (Anchor) ×1 IMPLANT
ANCHOR JUGGERKNOT WTAP NDL 2.9 (Anchor) ×6 IMPLANT
ANCHOR SUT QUATTRO KNTLS 4.5 (Anchor) ×2 IMPLANT
ANCHOR SUT W/ ORTHOCORD (Anchor) ×6 IMPLANT
BIT DRILL JUGRKNT W/NDL BIT2.9 (DRILL) ×2 IMPLANT
BLADE FULL RADIUS 3.5 (BLADE) ×3 IMPLANT
BLADE SHAVER 4.5X7 STR FR (MISCELLANEOUS) ×1 IMPLANT
BUR ACROMIONIZER 4.0 (BURR) ×3 IMPLANT
BUR BR 5.5 WIDE MOUTH (BURR) ×1 IMPLANT
CANNULA SHAVER 8MMX76MM (CANNULA) ×5 IMPLANT
CHLORAPREP W/TINT 26ML (MISCELLANEOUS) ×6 IMPLANT
COVER MAYO STAND STRL (DRAPES) ×3 IMPLANT
DECANTER SPIKE VIAL GLASS SM (MISCELLANEOUS) ×2 IMPLANT
DRAPE IMP U-DRAPE 54X76 (DRAPES) ×6 IMPLANT
DRILL JUGGERKNOT W/NDL BIT 2.9 (DRILL) ×3
DRSG OPSITE POSTOP 4X8 (GAUZE/BANDAGES/DRESSINGS) ×3 IMPLANT
ELECT REM PT RETURN 9FT ADLT (ELECTROSURGICAL) ×3
ELECTRODE REM PT RTRN 9FT ADLT (ELECTROSURGICAL) ×1 IMPLANT
GAUZE PETRO XEROFOAM 1X8 (MISCELLANEOUS) ×3 IMPLANT
GAUZE SPONGE 4X4 12PLY STRL (GAUZE/BANDAGES/DRESSINGS) ×3 IMPLANT
GLOVE BIO SURGEON STRL SZ7.5 (GLOVE) ×6 IMPLANT
GLOVE BIO SURGEON STRL SZ8 (GLOVE) ×6 IMPLANT
GLOVE BIOGEL PI IND STRL 8 (GLOVE) ×1 IMPLANT
GLOVE BIOGEL PI INDICATOR 8 (GLOVE) ×2
GLOVE INDICATOR 8.0 STRL GRN (GLOVE) ×3 IMPLANT
GOWN STRL REUS W/ TWL LRG LVL3 (GOWN DISPOSABLE) ×2 IMPLANT
GOWN STRL REUS W/ TWL XL LVL3 (GOWN DISPOSABLE) ×1 IMPLANT
GOWN STRL REUS W/TWL LRG LVL3 (GOWN DISPOSABLE) ×6
GOWN STRL REUS W/TWL XL LVL3 (GOWN DISPOSABLE) ×3
GRASPER SUT 15 45D LOW PRO (SUTURE) ×4 IMPLANT
IV LACTATED RINGER IRRG 3000ML (IV SOLUTION) ×9
IV LR IRRIG 3000ML ARTHROMATIC (IV SOLUTION) ×2 IMPLANT
MANIFOLD NEPTUNE II (INSTRUMENTS) ×3 IMPLANT
MASK FACE SPIDER DISP (MASK) ×3 IMPLANT
MAT BLUE FLOOR 46X72 FLO (MISCELLANEOUS) ×3 IMPLANT
NDL REVERSE CUT 1/2 CRC (NEEDLE) ×1 IMPLANT
NEEDLE REVERSE CUT 1/2 CRC (NEEDLE) IMPLANT
PACK ARTHROSCOPY SHOULDER (MISCELLANEOUS) ×3 IMPLANT
SLING ARM LRG DEEP (SOFTGOODS) ×1 IMPLANT
SLING ULTRA II LG (MISCELLANEOUS) ×3 IMPLANT
STAPLER SKIN PROX 35W (STAPLE) ×3 IMPLANT
STRAP SAFETY BODY (MISCELLANEOUS) ×3 IMPLANT
SUT ETHIBOND 0 MO6 C/R (SUTURE) ×3 IMPLANT
SUT VIC AB 2-0 CT1 27 (SUTURE) ×6
SUT VIC AB 2-0 CT1 TAPERPNT 27 (SUTURE) ×2 IMPLANT
TAPE MICROFOAM 4IN (TAPE) ×3 IMPLANT
TUBING ARTHRO INFLOW-ONLY STRL (TUBING) ×3 IMPLANT
TUBING CONNECTING 10 (TUBING) ×2 IMPLANT
TUBING CONNECTING 10' (TUBING) ×1
WAND HAND CNTRL MULTIVAC 90 (MISCELLANEOUS) ×3 IMPLANT

## 2016-05-18 NOTE — Op Note (Signed)
05/18/2016  1:33 PM  Patient:   Cindy Griffin  Pre-Op Diagnosis:   Impingement/tendinopathy with intrasubstance rotator cuff tear and degenerative joint disease of A-C joint, left shoulder  Postoperative diagnosis: Impingement/tendinopathy with full-thickness rotator cuff tear, degenerative joint disease of A-C joint, and SLAP tear, left shoulder.  Procedure: Extensive arthroscopic debridement, arthroscopic SLAP repair, arthroscopic subacromial decompression, arthroscopic distal clavicle excision, mini-open rotator cuff repair, and mini-open biceps tenodesis, left shoulder.  Anesthesia: General endotracheal with interscalene block placed preoperatively by the anesthesiologist.  Surgeon:   Pascal Lux, MD  Assistant:   Cameron Proud, PA-C; Alferd Apa, PA-S  Findings: As above. There was a full-thickness degenerative tear involving the mid insertional fibers of the supraspinatus tendon measuring approximately 1.5 x 2.0 cm. The remainder of the rotator cuff was in satisfactory condition. There was an unstable SLAP tear measuring from the 10:30 to the 2:30 position with subluxation and the glenohumeral joint. The biceps tendon appeared intact, although it was somewhat flattened and thickened intra-articularly. There also were grade 1-2 focal chondromalacial changes involving the humeral head, as well as some mild degenerative changes of the glenoid articular surface.  Complications: None  Fluids:   650 cc  Estimated blood loss: 5 cc  Tourniquet time: None  Drains: None  Closure: Staples   Brief clinical note: The patient is a 70 year old female with a history of left shoulder pain. The patient's symptoms have progressed despite medications, activity modification, etc. The patient's history and examination are consistent with impingement/tendinopathy with a probable rotator cuff tear. These findings were confirmed by MRI scan which also demonstrated evidence  of symptomatic degenerative changes of the A-C joint. The patient presents at this time for definitive management of these shoulder symptoms.  Procedure: The patient underwent placement of an interscalene block by the anesthesiologist in the preoperative holding area before she was brought into the operating room and lain in the supine position. The patient then underwent general endotracheal intubation and anesthesia before being repositioned in the beach chair position using the beach chair positioner. The left shoulder and upper extremity were prepped with ChloraPrep solution before being draped sterilely. Preoperative antibiotics were administered. A timeout was performed to confirm the proper surgical site before the expected portal sites and incision site were injected with 0.5% Sensorcaine with epinephrine. A posterior portal was created and the glenohumeral joint thoroughly inspected with the findings as described above. An anterior portal was created using an outside-in technique. The labrum and rotator cuff were further probed, again confirming the above-noted findings. The central portion of the superior labrum was debrided back to stable margins using the full-radius resector, as were areas of synovitis anteriorly, superiorly, and posteriorly. In addition, several small areas of chondromalacia on both the glenoid and humerus were debrided back to stable margins using the full-radius resector. The biceps tendon was released from its attachment to the labrum using the ArthroCare wand. The superior labrum was stabilized using a single [employer] BioKnotless anchor placed at approximately the 12:00 position through a separate superolateral portal site which was created using an outside-in technique. Subsequent probing of the repair demonstrated excellent stability. The ArthroCare wand was re-inserted and used to obtain hemostasis, as well as to "anneal" the labrum superiorly and anteriorly. The instruments  were removed from the joint after suctioning the excess fluid.  The camera was repositioned through the posterior portal into the subacromial space. A separate lateral portal was created using an outside-in technique. The 3.5 mm full-radius resector was  introduced and used to perform a subtotal bursectomy. The ArthroCare wand was then inserted and used to remove the periosteal tissue off the undersurface of the anterior third of the acromion as well as to recess the coracoacromial ligament from its attachment along the anterior and lateral margins of the acromion. The 4.0 mm acromionizing bur was introduced and used to complete the decompression by removing the undersurface of the anterior third of the acromion. The ArthroCare wand was inserted and used to remove capsular and periosteal tissue from around the inferior, anterior, and posterior aspects of the distal clavicle before the 4.0 mm acromionizing bur was re-introduced and advanced medially to remove the undersurface of the distal clavicle. The camera was repositioned in the lateral portal and the 4 mm acromionizing bur placed through the anterior portal to complete excision of the distal clavicle. The adequacy of distal clavicle excision was verified arthroscopically from both the lateral and anterior portals before it was repositioned in the posterior portal. The full radius resector was reintroduced to remove any residual bony debris before the ArthroCare wand was reintroduced to obtain hemostasis. The instruments were then removed from the subacromial space after suctioning the excess fluid.  An approximately 4-5 cm incision was made over the anterolateral aspect of the shoulder beginning at the anterolateral corner of the acromion and extending distally in line with the bicipital groove, incorporating the superolateral portal site that was placed earlier. This incision was carried down through the subcutaneous tissues to expose the deltoid fascia.  The raphae between the anterior and middle thirds was identified and this plane developed to provide access into the subacromial space. Additional bursal tissues were debrided sharply using Metzenbaum scissors. The rotator cuff tear was readily identified. The margins were debrided sharply with a #15 blade and the exposed greater tuberosity roughened with a rongeur. The tear was repaired using a single Biomet 2.9 mm JuggerKnot anchor placed laterally at the articular margin while several #0 Ethibond interrupted sutures were placed in a side-to-side fashion to reapproximate the more anterior and posterior extensions of the tear. The 4 FiberWire sutures from the JuggerKnot anchor were then brought back laterally and secured using a [employer] anchor to create a two-layer closure. An apparent watertight closure was obtained.  The bicipital groove was identified by palpation. The biceps tendon stump was identified by palpation within the groove. Given the patient's age and size, it was elected to perform a soft tissue tenodesis. This was performed using two #0 Ethibond interrupted sutures placed through the bicipital sheath and incorporating the biceps tendon stump.  The wound was copiously irrigated with sterile saline solution before the deltoid raphae was reapproximated using 2-0 Vicryl interrupted sutures. The subcutaneous tissues were closed in two layers using 2-0 Vicryl interrupted sutures before the skin was closed using staples. The portal sites also were closed using staples. A sterile bulky dressing was applied to the shoulder before the arm was placed into a shoulder immobilizer. The patient was then awakened, extubated, and returned to the recovery room in satisfactory condition after tolerating the procedure well.

## 2016-05-18 NOTE — Transfer of Care (Signed)
Immediate Anesthesia Transfer of Care Note  Patient: Cindy Griffin  Procedure(s) Performed: Procedure(s): SHOULDER ARTHROSCOPY WITH OPEN ROTATOR CUFF REPAIR,distal clavicle excision, decompression (Left)  Patient Location: PACU  Anesthesia Type:General  Level of Consciousness: awake  Airway & Oxygen Therapy: Patient Spontanous Breathing and Patient connected to face mask oxygen  Post-op Assessment: Report given to RN  Post vital signs: Reviewed and stable  Last Vitals:  Filed Vitals:   05/18/16 0850 05/18/16 0857  BP: 113/65 119/77  Pulse: 72 66  Temp: 36.7 C   Resp: 16 18    Last Pain:  Filed Vitals:   05/18/16 0941  PainSc: 6          Complications: No apparent anesthesia complications

## 2016-05-18 NOTE — Discharge Instructions (Addendum)
Keep dressing dry and intact.  May shower after dressing changed on post-op day #4 (Saturday).  Cover staples with Band-Aids after drying off. Apply ice frequently to shoulder. Keep shoulder immobilizer on at all times except may remove for bathing purposes.Follow-up in 10-14 days or as scheduled    .AMBULATORY SURGERY  DISCHARGE INSTRUCTIONS   1) The drugs that you were given will stay in your system until tomorrow so for the next 24 hours you should not:  A) Drive an automobile B) Make any legal decisions C) Drink any alcoholic beverage   2) You may resume regular meals tomorrow.  Today it is better to start with liquids and gradually work up to solid foods.  You may eat anything you prefer, but it is better to start with liquids, then soup and crackers, and gradually work up to solid foods.   3) Please notify your doctor immediately if you have any unusual bleeding, trouble breathing, redness and pain at the surgery site, drainage, fever, or pain not relieved by medication. 4)   5) Your post-operative visit with Dr.                                     is: Date:                        Time:    Please call to schedule your post-operative visit.  6) Additional Instructions:
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
## 2016-05-18 NOTE — Anesthesia Procedure Notes (Signed)
Procedure Name: Intubation Date/Time: 05/18/2016 11:24 AM Performed by: Silvana Newness Pre-anesthesia Checklist: Patient identified, Emergency Drugs available, Suction available, Patient being monitored and Timeout performed Patient Re-evaluated:Patient Re-evaluated prior to inductionOxygen Delivery Method: Circle system utilized Preoxygenation: Pre-oxygenation with 100% oxygen Intubation Type: IV induction Ventilation: Oral airway inserted - appropriate to patient size Laryngoscope Size: Mac and 3 Grade View: Grade I Tube type: Oral Tube size: 7.0 mm Number of attempts: 1 Airway Equipment and Method: Rigid stylet Placement Confirmation: ETT inserted through vocal cords under direct vision,  positive ETCO2 and breath sounds checked- equal and bilateral Secured at: 21 cm Tube secured with: Tape Dental Injury: Teeth and Oropharynx as per pre-operative assessment

## 2016-05-18 NOTE — H&P (Signed)
Paper H&P to be scanned into permanent record. H&P reviewed. No changes. 

## 2016-05-19 ENCOUNTER — Encounter: Payer: Self-pay | Admitting: Surgery

## 2016-05-20 NOTE — Anesthesia Postprocedure Evaluation (Signed)
Anesthesia Post Note  Patient: Cindy Griffin  Procedure(s) Performed: Procedure(s) (LRB): SHOULDER ARTHROSCOPY WITH OPEN ROTATOR CUFF REPAIR,distal clavicle excision, decompression (Left)  Patient location during evaluation: PACU Anesthesia Type: General Level of consciousness: awake and alert Pain management: pain level controlled Vital Signs Assessment: post-procedure vital signs reviewed and stable Respiratory status: spontaneous breathing, nonlabored ventilation, respiratory function stable and patient connected to nasal cannula oxygen Cardiovascular status: blood pressure returned to baseline and stable Postop Assessment: no signs of nausea or vomiting Anesthetic complications: no    Last Vitals:  Filed Vitals:   05/18/16 1551 05/18/16 1641  BP: 127/69 117/68  Pulse: 79 83  Temp: 36.6 C   Resp: 16 16    Last Pain:  Filed Vitals:   05/19/16 0837  PainSc: 4                  Molli Barrows

## 2016-05-25 DIAGNOSIS — M25512 Pain in left shoulder: Secondary | ICD-10-CM | POA: Diagnosis not present

## 2016-05-27 DIAGNOSIS — M25512 Pain in left shoulder: Secondary | ICD-10-CM | POA: Diagnosis not present

## 2016-06-02 DIAGNOSIS — M25512 Pain in left shoulder: Secondary | ICD-10-CM | POA: Diagnosis not present

## 2016-06-04 DIAGNOSIS — M25512 Pain in left shoulder: Secondary | ICD-10-CM | POA: Diagnosis not present

## 2016-06-07 ENCOUNTER — Other Ambulatory Visit: Payer: Self-pay | Admitting: Family Medicine

## 2016-06-07 DIAGNOSIS — M25512 Pain in left shoulder: Secondary | ICD-10-CM | POA: Diagnosis not present

## 2016-06-10 DIAGNOSIS — M25512 Pain in left shoulder: Secondary | ICD-10-CM | POA: Diagnosis not present

## 2016-06-14 ENCOUNTER — Other Ambulatory Visit: Payer: Self-pay | Admitting: Family Medicine

## 2016-06-14 DIAGNOSIS — M25512 Pain in left shoulder: Secondary | ICD-10-CM | POA: Diagnosis not present

## 2016-06-14 NOTE — Telephone Encounter (Signed)
Patient requesting refill. 

## 2016-06-17 DIAGNOSIS — M25512 Pain in left shoulder: Secondary | ICD-10-CM | POA: Diagnosis not present

## 2016-06-21 DIAGNOSIS — M25512 Pain in left shoulder: Secondary | ICD-10-CM | POA: Diagnosis not present

## 2016-06-24 DIAGNOSIS — M25512 Pain in left shoulder: Secondary | ICD-10-CM | POA: Diagnosis not present

## 2016-06-28 DIAGNOSIS — M25512 Pain in left shoulder: Secondary | ICD-10-CM | POA: Diagnosis not present

## 2016-07-01 DIAGNOSIS — M25512 Pain in left shoulder: Secondary | ICD-10-CM | POA: Diagnosis not present

## 2016-07-04 ENCOUNTER — Other Ambulatory Visit: Payer: Self-pay | Admitting: Family Medicine

## 2016-07-05 DIAGNOSIS — M25512 Pain in left shoulder: Secondary | ICD-10-CM | POA: Diagnosis not present

## 2016-07-08 DIAGNOSIS — M25512 Pain in left shoulder: Secondary | ICD-10-CM | POA: Diagnosis not present

## 2016-07-12 DIAGNOSIS — M25512 Pain in left shoulder: Secondary | ICD-10-CM | POA: Diagnosis not present

## 2016-07-15 ENCOUNTER — Encounter: Payer: Self-pay | Admitting: Family Medicine

## 2016-07-15 ENCOUNTER — Ambulatory Visit (INDEPENDENT_AMBULATORY_CARE_PROVIDER_SITE_OTHER): Payer: PPO | Admitting: Family Medicine

## 2016-07-15 VITALS — BP 108/68 | HR 102 | Temp 97.8°F | Resp 18 | Ht 64.0 in | Wt 220.5 lb

## 2016-07-15 DIAGNOSIS — R202 Paresthesia of skin: Secondary | ICD-10-CM

## 2016-07-15 DIAGNOSIS — M25512 Pain in left shoulder: Secondary | ICD-10-CM | POA: Diagnosis not present

## 2016-07-15 DIAGNOSIS — I10 Essential (primary) hypertension: Secondary | ICD-10-CM | POA: Diagnosis not present

## 2016-07-15 DIAGNOSIS — E1122 Type 2 diabetes mellitus with diabetic chronic kidney disease: Secondary | ICD-10-CM | POA: Diagnosis not present

## 2016-07-15 DIAGNOSIS — E785 Hyperlipidemia, unspecified: Secondary | ICD-10-CM

## 2016-07-15 DIAGNOSIS — G4700 Insomnia, unspecified: Secondary | ICD-10-CM | POA: Diagnosis not present

## 2016-07-15 DIAGNOSIS — J411 Mucopurulent chronic bronchitis: Secondary | ICD-10-CM

## 2016-07-15 DIAGNOSIS — E1165 Type 2 diabetes mellitus with hyperglycemia: Secondary | ICD-10-CM

## 2016-07-15 LAB — POCT UA - MICROALBUMIN: Microalbumin Ur, POC: 50 mg/L

## 2016-07-15 LAB — POCT GLYCOSYLATED HEMOGLOBIN (HGB A1C): Hemoglobin A1C: 8

## 2016-07-15 LAB — VITAMIN B12: Vitamin B-12: 404 pg/mL (ref 200–1100)

## 2016-07-15 MED ORDER — LIRAGLUTIDE 18 MG/3ML ~~LOC~~ SOPN: 1.8000 mg | PEN_INJECTOR | SUBCUTANEOUS | 1 refills | Status: DC

## 2016-07-15 MED ORDER — LISINOPRIL 40 MG PO TABS: 40.0000 mg | ORAL_TABLET | Freq: Two times a day (BID) | ORAL | 1 refills | Status: DC

## 2016-07-15 MED ORDER — HYDROCHLOROTHIAZIDE 25 MG PO TABS: 25.0000 mg | ORAL_TABLET | Freq: Every day | ORAL | 1 refills | Status: DC

## 2016-07-15 MED ORDER — EMPAGLIFLOZIN-METFORMIN HCL 12.5-1000 MG PO TABS: 1.0000 | ORAL_TABLET | Freq: Two times a day (BID) | ORAL | 1 refills | Status: DC

## 2016-07-15 MED ORDER — LISINOPRIL 40 MG PO TABS: 40.0000 mg | ORAL_TABLET | Freq: Every day | ORAL | 1 refills | Status: DC

## 2016-07-15 MED ORDER — TRAZODONE HCL 50 MG PO TABS: 25.0000 mg | ORAL_TABLET | Freq: Every evening | ORAL | 1 refills | Status: DC | PRN

## 2016-07-15 MED ORDER — FLUTICASONE-SALMETEROL 100-50 MCG/DOSE IN AEPB: 1.0000 | INHALATION_SPRAY | Freq: Two times a day (BID) | RESPIRATORY_TRACT | 5 refills | Status: DC

## 2016-07-15 NOTE — Addendum Note (Signed)
Addended by: Inda Coke on: 07/15/2016 10:52 AM   Modules accepted: Orders

## 2016-07-15 NOTE — Progress Notes (Signed)
Name: Cindy Griffin   MRN: [id number]    DOB: 11-10-1946   Date:07/15/2016       Progress Note  Subjective  Chief Complaint  Chief Complaint  Patient presents with  . Diabetes    pt here for 4 month follow up    HPI   DMII with nephropathy: she is taking medications as prescribed, denies side effects. She states she is doing better with her diet, hgbA1C went from  7.5% to 8.1% up to 8.6%and now down to 8.0% She denies polydipsia, polyphagia or polyuria. FSBS: 132-175 fasting, she forgot to bring her log.   HTN: taking medication and denies side effects of medication. No chest pain or palpitation. She has some episodes of dizziness.   Hyperlipidemia: taking Atorvastatin and denies side effects of medications, due for labs in December  Cervical and lumbar radiculitis: seeing Dr. Phyllis Ginger, had a Left L4-5 transforaminal epidural injection, she also had PT. Had MRI of shoulder and recent left shoulder surgery, however she continues to have numbness and tingling on both hands.   Insomnia: she has difficulty falling and staying asleep, she continues to take Trazodone prn and helps with her symptoms.   Morbid Obesity: she tried Contrave but made her vomit. We started her on Belvig in April 2017 and she lost weight, she was down 22 lbs by June when she had shoulder surgery, but she stopped exercising and taking medication and has gained 10 lbs since. She states she will try to start walking again. We will adjust DM medication and see if it will help with weight loss.   Chronic Bronchitis: she is doing well at this time, she has chronic morning cough, that is productive and clear to yellow in color. She is using Advair twice daily but still has some mild SOB with activity. She used to smoke very little, but was second smoker all her life ( husband, brother's and co-workers)   Patient Active Problem List   Diagnosis Date Noted  . Hypercalcemia 11/23/2015  . DDD (degenerative disc  disease), lumbar 07/28/2015  . Spinal stenosis at L4-L5 level 06/12/2015  . Benign essential HTN 05/25/2015  . Bunion 05/25/2015  . Cardiac enlargement 05/25/2015  . Cervical radicular pain 05/25/2015  . Back pain, chronic 05/25/2015  . Osteoarthritis 05/25/2015  . Dyslipidemia 05/25/2015  . Gastric reflux 05/25/2015  . Insomnia 05/25/2015  . Eczema intertrigo 05/25/2015  . Bronchitis, chronic, mucopurulent ([place]) 05/25/2015  . Numerous moles 05/25/2015  . Morbid obesity ([place]) 05/25/2015  . Perennial allergic rhinitis 05/25/2015  . DM (diabetes mellitus), type 2, uncontrolled, with renal complications ([place]) 61/95/0932  . Snores 05/25/2015  . History of artificial joint 05/25/2015  . Degeneration of intervertebral disc of cervical region 02/06/2015  . Neuritis or radiculitis due to rupture of lumbar intervertebral disc 01/14/2015  . Abnormal presence of protein in urine 05/04/2010  . Vitamin D deficiency 05/04/2010    Past Surgical History:  Procedure Laterality Date  . ABDOMINAL HYSTERECTOMY     due to cancer-partial  . BREAST BIOPSY Right    Benign  . COLONOSCOPY N/A 04/11/2015   Procedure: COLONOSCOPY;  Surgeon: Hulen Luster, MD;  Location: [facility];  Service: Gastroenterology;  Laterality: N/A;  . JOINT REPLACEMENT     left knee 2013 right knee 2005  . SHOULDER ARTHROSCOPY WITH OPEN ROTATOR CUFF REPAIR Left 05/18/2016   Procedure: SHOULDER ARTHROSCOPY WITH OPEN ROTATOR CUFF REPAIR,distal clavicle excision, decompression;  Surgeon: Corky Mull, MD;  Location: [facility] [place] [facility]  ORS;  Service: Orthopedics;  Laterality: Left;  . SPINAL FUSION     C-Spine  . Transforaminal Epidural  02/20/2015   Injection into cervical spine- C5-6 Dr. Phyllis Ginger    Family History  Problem Relation Age of Onset  . Diabetes Mother   . Kidney disease Mother   . Hypertension Mother   . Asthma Daughter   . Diabetes Sister   . Diabetes Brother     Social History   Social History  . Marital status:  Widowed    Spouse name: N/A  . Number of children: N/A  . Years of education: N/A   Occupational History  . Not on file.   Social History Main Topics  . Smoking status: Never Smoker  . Smokeless tobacco: Never Used  . Alcohol use 0.0 oz/week     Comment: occasionally  . Drug use: No  . Sexual activity: Not Currently   Other Topics Concern  . Not on file   Social History Narrative  . No narrative on file     Current Outpatient Prescriptions:  .  acetaminophen (TYLENOL) 500 MG tablet, Take 1,000 mg by mouth every 6 (six) hours as needed (pain)., Disp: , Rfl:  .  aspirin EC 81 MG tablet, Take 81 mg by mouth daily., Disp: , Rfl:  .  atorvastatin (LIPITOR) 80 MG tablet, Take 1 tablet (80 mg total) by mouth daily., Disp: 90 tablet, Rfl: 1 .  Blood Glucose Monitoring Suppl (ACCU-CHEK NANO SMARTVIEW) W/DEVICE KIT, 1 Device by Does not apply route 3 (three) times daily., Disp: 1 kit, Rfl: 0 .  celecoxib (CELEBREX) 200 MG capsule, Take 1 capsule (200 mg total) by mouth daily., Disp: 90 capsule, Rfl: 1 .  cetirizine (ZYRTEC) 10 MG tablet, Take 1 tablet by mouth daily., Disp: , Rfl:  .  Cholecalciferol (VITAMIN D-3 PO), Take 1,000 Int'l Units by mouth daily., Disp: , Rfl:  .  Empagliflozin-Metformin HCl (SYNJARDY) 12.03-999 MG TABS, Take 1 tablet by mouth 2 (two) times daily. In place of invokana nd metformin, Disp: 180 tablet, Rfl: 1 .  ferrous sulfate 325 (65 FE) MG EC tablet, Take 1 tablet by mouth daily., Disp: , Rfl:  .  fluticasone (FLONASE) 50 MCG/ACT nasal spray, Place 2 sprays into both nostrils daily., Disp: 16 g, Rfl: 6 .  Fluticasone-Salmeterol (ADVAIR) 100-50 MCG/DOSE AEPB, Inhale 1 puff into the lungs 2 (two) times daily., Disp: 60 each, Rfl: 5 .  glucose blood (ADVOCATE REDI-CODE) test strip, 1 each by Other route QID., Disp: 100 each, Rfl: 12 .  hydrochlorothiazide (HYDRODIURIL) 25 MG tablet, Take 1 tablet (25 mg total) by mouth daily., Disp: 90 tablet, Rfl: 1 .  Liraglutide  (VICTOZA) 18 MG/3ML SOPN, Inject 0.3 mLs (1.8 mg total) into the skin every morning., Disp: 18 mL, Rfl: 1 .  lisinopril (PRINIVIL,ZESTRIL) 40 MG tablet, Take 1 tablet (40 mg total) by mouth daily. Change in therapy - once a day. At night, Disp: 90 tablet, Rfl: 1 .  Multiple Vitamins-Minerals (MULTIVITAMIN PO), Take 1 tablet by mouth daily., Disp: , Rfl:  .  Omega-3 Fatty Acids (FISH OIL BURP-LESS) 500 MG CAPS, Take 1 capsule by mouth daily. , Disp: , Rfl:  .  omeprazole (PRILOSEC) 20 MG capsule, Take 1 capsule (20 mg total) by mouth daily., Disp: 90 capsule, Rfl: 4 .  tiZANidine (ZANAFLEX) 2 MG tablet, Take 2 mg by mouth daily as needed for muscle spasms. , Disp: , Rfl:  .  traMADol (ULTRAM) 50 MG  tablet, TAKE ONE TABLET BY MOUTH EVERY 6 HOURS AS NEEDED FOR PAIN, Disp: 90 tablet, Rfl: 0 .  traZODone (DESYREL) 50 MG tablet, Take 0.5-1 tablets (25-50 mg total) by mouth at bedtime as needed for sleep., Disp: 90 tablet, Rfl: 1  No Known Allergies   ROS  Constitutional: Negative for fever , positive  weight change.  Respiratory: positive  for cough and shortness of breath.   Cardiovascular: Negative for chest pain or palpitations.  Gastrointestinal: Negative for abdominal pain, no bowel changes.  Musculoskeletal: Negative for gait problem or joint swelling.  Skin: Negative for rash.  Neurological: Negative for dizziness or headache.  No other specific complaints in a complete review of systems (except as listed in HPI above).  Objective  Vitals:   07/15/16 0932  BP: 108/68  Pulse: (!) 102  Resp: 18  Temp: 97.8 F (36.6 C)  SpO2: 97%  Weight: 220 lb 8 oz (100 kg)  Height: _0  (1.626 m)    Body mass index is 37.85 kg/m.  Physical Exam  Constitutional: Patient appears well-developed and well-nourished. Obese  No distress.  HEENT: head atraumatic, normocephalic, pupils equal and reactive to light, neck supple, throat within normal limits Cardiovascular: Normal rate, regular  rhythm and normal heart sounds.  No murmur heard. No BLE edema. Pulmonary/Chest: Effort normal and breath sounds normal. No respiratory distress. Abdominal: Soft.  There is no tenderness. Psychiatric: Patient has a normal mood and affect. behavior is normal. Judgment and thought content normal. Muscular Skeletal: some decrease in rom of left shoulder, scar well healed, decrease rom of neck, normal grip  Recent Results (from the past 2160 hour(s))  Basic metabolic panel     Status: Abnormal   Collection Time: 05/11/16 10:47 AM  Result Value Ref Range   Sodium 139 135 - 145 mmol/L   Potassium 4.2 3.5 - 5.1 mmol/L   Chloride 103 101 - 111 mmol/L   CO2 27 22 - 32 mmol/L   Glucose, Bld 165 (H) 65 - 99 mg/dL   BUN 19 6 - 20 mg/dL   Creatinine, Ser 0.96 0.44 - 1.00 mg/dL   Calcium 9.9 8.9 - 10.3 mg/dL   GFR calc non Af Amer 59 (L) >60 mL/min   GFR calc Af Amer >60 >60 mL/min    Comment: (NOTE) The eGFR has been calculated using the CKD EPI equation. This calculation has not been validated in all clinical situations. eGFR's persistently <60 mL/min signify possible Chronic Kidney Disease.    Anion gap 9 5 - 15  CBC     Status: Abnormal   Collection Time: 05/11/16 10:47 AM  Result Value Ref Range   WBC 7.6 3.6 - 11.0 K/uL   RBC 4.70 3.80 - 5.20 MIL/uL   Hemoglobin 13.0 12.0 - 16.0 g/dL   HCT 40.2 35.0 - 47.0 %   MCV 85.7 80.0 - 100.0 fL   MCH 27.6 26.0 - 34.0 pg   MCHC 32.2 32.0 - 36.0 g/dL   RDW 15.7 (H) 11.5 - 14.5 %   Platelets 294 150 - 440 K/uL  Glucose, capillary     Status: Abnormal   Collection Time: 05/18/16  8:57 AM  Result Value Ref Range   Glucose-Capillary 176 (H) 65 - 99 mg/dL  Glucose, capillary     Status: Abnormal   Collection Time: 05/18/16  2:17 PM  Result Value Ref Range   Glucose-Capillary 167 (H) 65 - 99 mg/dL  POCT HgB A1C     Status:  Abnormal   Collection Time: 07/15/16  9:40 AM  Result Value Ref Range   Hemoglobin A1C 8.0     Diabetic Foot  Exam: Diabetic Foot Exam - Simple   Simple Foot Form Diabetic Foot exam was performed with the following findings:  Yes 07/15/2016 10:44 AM  Visual Inspection No deformities, no ulcerations, no other skin breakdown bilaterally:  Yes Sensation Testing Intact to touch and monofilament testing bilaterally:  Yes Pulse Check Posterior Tibialis and Dorsalis pulse intact bilaterally:  Yes Comments     PHQ2/9: Depression screen [facility] 2/9 07/15/2016 03/15/2016 11/21/2015 08/29/2015 05/29/2015  Decreased Interest 0 0 0 0 0  Down, Depressed, Hopeless 0 0 0 1 0  PHQ - 2 Score 0 0 0 1 0     Fall Risk: Fall Risk  07/15/2016 03/15/2016 11/21/2015 08/29/2015 05/29/2015  Falls in the past year? No Yes No No No  Number falls in past yr: - 1 - - -  Injury with Fall? - Yes - - -     Functional Status Survey: Is the patient deaf or have difficulty hearing?: No Does the patient have difficulty seeing, even when wearing glasses/contacts?: No Does the patient have difficulty concentrating, remembering, or making decisions?: No Does the patient have difficulty walking or climbing stairs?: No Does the patient have difficulty dressing or bathing?: No Does the patient have difficulty doing errands alone such as visiting a doctor's office or shopping?: No    Assessment & Plan  1. Uncontrolled type 2 diabetes mellitus with chronic kidney disease, without long-term current use of insulin, unspecified CKD stage ([place])  We will stop Glipizide, metformin and Invokana. We will start Synjardi, Victoza and continue healthy diet. Discussed possible side effects of medication. No family history of thyroid cancer or personal history of pancreatitis.  - POCT HgB A1C - Empagliflozin-Metformin HCl (SYNJARDY) 12.03-999 MG TABS; Take 1 tablet by mouth 2 (two) times daily. In place of invokana nd metformin  Dispense: 180 tablet; Refill: 1 - Liraglutide (VICTOZA) 18 MG/3ML SOPN; Inject 0.3 mLs (1.8 mg total) into the skin every  morning.  Dispense: 18 mL; Refill: 1 - lisinopril (PRINIVIL,ZESTRIL) 40 MG tablet; Take 1 tablet (40 mg total) by mouth daily. Change in therapy - once a day. At night  Dispense: 90 tablet; Refill: 1  2. Insomnia  - traZODone (DESYREL) 50 MG tablet; Take 0.5-1 tablets (25-50 mg total) by mouth at bedtime as needed for sleep.  Dispense: 90 tablet; Refill: 1  3. Benign essential HTN  - hydrochlorothiazide (HYDRODIURIL) 25 MG tablet; Take 1 tablet (25 mg total) by mouth daily.  Dispense: 90 tablet; Refill: 1 - lisinopril (PRINIVIL,ZESTRIL) 40 MG tablet; Take 1 tablet (40 mg total) by mouth daily. Change in therapy - once a day. At night  Dispense: 90 tablet; Refill: 1  4. Dyslipidemia  Advised Co-Q 10 and stretching before bed to help with muscle cramps at night  5. Morbid obesity due to excess calories ([place])  We will try Victoza  6. Paresthesia  - B12  7. Mucopurulent chronic bronchitis ([facility])  - Fluticasone-Salmeterol (ADVAIR) 100-50 MCG/DOSE AEPB; Inhale 1 puff into the lungs 2 (two) times daily.  Dispense: 60 each; Refill: 5

## 2016-07-15 NOTE — Patient Instructions (Addendum)
Stop:  Metformin Invokana Glipizide  Start   Synjardy 12.03-999 mg twice daily  Victoza start at 0.6 for one week, 1.2 or the second week and 1.8 daily after that   Decrease   Lisinopril from twice daily to once a day ( at night ) and take Hydrochlorothiazide every morning

## 2016-07-16 ENCOUNTER — Telehealth: Payer: Self-pay

## 2016-07-16 ENCOUNTER — Other Ambulatory Visit: Payer: Self-pay | Admitting: Family Medicine

## 2016-07-16 NOTE — Telephone Encounter (Signed)
Patient called and states [facility] told patient Iva Boop would be cheaper with a 90 day supply, right now is it too expensive as a 30 day supply. A 90 day supply would cost patient $400. But patient states she is unable to afford Victoza due to this medication costing her $327.00 a month. Asked patient if she was in the donut hole and patient did not know. Gave patient information to Patient Assistance Program for medication and patient states she makes too much money to receive any. Please advise.

## 2016-07-19 ENCOUNTER — Other Ambulatory Visit: Payer: Self-pay | Admitting: Family Medicine

## 2016-07-19 DIAGNOSIS — M25512 Pain in left shoulder: Secondary | ICD-10-CM | POA: Diagnosis not present

## 2016-07-19 MED ORDER — GLIPIZIDE ER 5 MG PO TB24: 5.0000 mg | ORAL_TABLET | Freq: Every day | ORAL | 0 refills | Status: DC

## 2016-07-22 DIAGNOSIS — M25512 Pain in left shoulder: Secondary | ICD-10-CM | POA: Diagnosis not present

## 2016-07-26 DIAGNOSIS — M25512 Pain in left shoulder: Secondary | ICD-10-CM | POA: Diagnosis not present

## 2016-07-29 ENCOUNTER — Telehealth: Payer: Self-pay | Admitting: Family Medicine

## 2016-07-29 DIAGNOSIS — M25512 Pain in left shoulder: Secondary | ICD-10-CM | POA: Diagnosis not present

## 2016-07-29 NOTE — Telephone Encounter (Signed)
We stopped Victoza, she is back on Glipizide

## 2016-07-30 NOTE — Telephone Encounter (Signed)
Patient notified

## 2016-08-03 ENCOUNTER — Other Ambulatory Visit: Payer: Self-pay | Admitting: Family Medicine

## 2016-08-03 DIAGNOSIS — Z1231 Encounter for screening mammogram for malignant neoplasm of breast: Secondary | ICD-10-CM

## 2016-08-03 DIAGNOSIS — M25512 Pain in left shoulder: Secondary | ICD-10-CM | POA: Diagnosis not present

## 2016-08-05 DIAGNOSIS — M25512 Pain in left shoulder: Secondary | ICD-10-CM | POA: Diagnosis not present

## 2016-08-09 DIAGNOSIS — M25512 Pain in left shoulder: Secondary | ICD-10-CM | POA: Diagnosis not present

## 2016-08-12 DIAGNOSIS — M25512 Pain in left shoulder: Secondary | ICD-10-CM | POA: Diagnosis not present

## 2016-08-16 DIAGNOSIS — M25512 Pain in left shoulder: Secondary | ICD-10-CM | POA: Diagnosis not present

## 2016-08-23 DIAGNOSIS — M25512 Pain in left shoulder: Secondary | ICD-10-CM | POA: Diagnosis not present

## 2016-08-25 ENCOUNTER — Ambulatory Visit
Admission: RE | Admit: 2016-08-25 | Discharge: 2016-08-25 | Disposition: A | Discharge status: Home / Self Care | Payer: PPO | Source: Ambulatory Visit | Attending: Family Medicine | Admitting: Family Medicine

## 2016-08-25 ENCOUNTER — Ambulatory Visit (INDEPENDENT_AMBULATORY_CARE_PROVIDER_SITE_OTHER): Payer: PPO

## 2016-08-25 ENCOUNTER — Other Ambulatory Visit: Payer: Self-pay | Admitting: Family Medicine

## 2016-08-25 DIAGNOSIS — Z1231 Encounter for screening mammogram for malignant neoplasm of breast: Secondary | ICD-10-CM

## 2016-08-25 DIAGNOSIS — Z23 Encounter for immunization: Secondary | ICD-10-CM

## 2016-08-26 DIAGNOSIS — M25512 Pain in left shoulder: Secondary | ICD-10-CM | POA: Diagnosis not present

## 2016-08-30 DIAGNOSIS — M25512 Pain in left shoulder: Secondary | ICD-10-CM | POA: Diagnosis not present

## 2016-09-02 DIAGNOSIS — M25512 Pain in left shoulder: Secondary | ICD-10-CM | POA: Diagnosis not present

## 2016-09-06 DIAGNOSIS — M25512 Pain in left shoulder: Secondary | ICD-10-CM | POA: Diagnosis not present

## 2016-09-09 DIAGNOSIS — M25512 Pain in left shoulder: Secondary | ICD-10-CM | POA: Diagnosis not present

## 2016-09-16 DIAGNOSIS — M25512 Pain in left shoulder: Secondary | ICD-10-CM | POA: Diagnosis not present

## 2016-09-20 DIAGNOSIS — M7582 Other shoulder lesions, left shoulder: Secondary | ICD-10-CM | POA: Diagnosis not present

## 2016-09-20 DIAGNOSIS — M24112 Other articular cartilage disorders, left shoulder: Secondary | ICD-10-CM | POA: Diagnosis not present

## 2016-09-20 DIAGNOSIS — M19012 Primary osteoarthritis, left shoulder: Secondary | ICD-10-CM | POA: Diagnosis not present

## 2016-09-20 DIAGNOSIS — M75122 Complete rotator cuff tear or rupture of left shoulder, not specified as traumatic: Secondary | ICD-10-CM | POA: Diagnosis not present

## 2016-09-24 ENCOUNTER — Other Ambulatory Visit: Payer: Self-pay | Admitting: Family Medicine

## 2016-09-24 NOTE — Telephone Encounter (Signed)
Patient requesting refill of Tramadol to [facility].

## 2016-09-29 ENCOUNTER — Other Ambulatory Visit: Payer: Self-pay

## 2016-09-29 NOTE — Telephone Encounter (Signed)
Patient requesting refill of Ultram to [facility].

## 2016-09-30 ENCOUNTER — Other Ambulatory Visit: Payer: Self-pay | Admitting: Family Medicine

## 2016-09-30 MED ORDER — TRAMADOL HCL 50 MG PO TABS: 50.0000 mg | ORAL_TABLET | Freq: Four times a day (QID) | ORAL | 0 refills | Status: DC | PRN

## 2016-10-01 NOTE — Telephone Encounter (Signed)
Patient requesting refill of Glipizide to [facility].

## 2016-10-03 ENCOUNTER — Other Ambulatory Visit: Payer: Self-pay | Admitting: Family Medicine

## 2016-10-04 ENCOUNTER — Other Ambulatory Visit: Payer: Self-pay

## 2016-10-04 ENCOUNTER — Other Ambulatory Visit: Payer: Self-pay | Admitting: Family Medicine

## 2016-10-04 MED ORDER — GLIPIZIDE ER 5 MG PO TB24: 5.0000 mg | ORAL_TABLET | Freq: Every day | ORAL | 0 refills | Status: DC

## 2016-11-08 DIAGNOSIS — M24112 Other articular cartilage disorders, left shoulder: Secondary | ICD-10-CM | POA: Diagnosis not present

## 2016-11-08 DIAGNOSIS — M19012 Primary osteoarthritis, left shoulder: Secondary | ICD-10-CM | POA: Diagnosis not present

## 2016-11-08 DIAGNOSIS — M7582 Other shoulder lesions, left shoulder: Secondary | ICD-10-CM | POA: Diagnosis not present

## 2016-11-08 DIAGNOSIS — M75122 Complete rotator cuff tear or rupture of left shoulder, not specified as traumatic: Secondary | ICD-10-CM | POA: Diagnosis not present

## 2016-11-11 ENCOUNTER — Ambulatory Visit (INDEPENDENT_AMBULATORY_CARE_PROVIDER_SITE_OTHER): Payer: PPO | Admitting: Family Medicine

## 2016-11-11 ENCOUNTER — Encounter: Payer: Self-pay | Admitting: Family Medicine

## 2016-11-11 VITALS — BP 124/66 | HR 90 | Temp 97.9°F | Resp 16 | Ht 64.0 in | Wt 224.5 lb

## 2016-11-11 DIAGNOSIS — K219 Gastro-esophageal reflux disease without esophagitis: Secondary | ICD-10-CM

## 2016-11-11 DIAGNOSIS — J411 Mucopurulent chronic bronchitis: Secondary | ICD-10-CM

## 2016-11-11 DIAGNOSIS — G4709 Other insomnia: Secondary | ICD-10-CM

## 2016-11-11 DIAGNOSIS — I10 Essential (primary) hypertension: Secondary | ICD-10-CM | POA: Diagnosis not present

## 2016-11-11 DIAGNOSIS — E1165 Type 2 diabetes mellitus with hyperglycemia: Secondary | ICD-10-CM | POA: Diagnosis not present

## 2016-11-11 DIAGNOSIS — J3089 Other allergic rhinitis: Secondary | ICD-10-CM

## 2016-11-11 DIAGNOSIS — E785 Hyperlipidemia, unspecified: Secondary | ICD-10-CM | POA: Diagnosis not present

## 2016-11-11 DIAGNOSIS — E559 Vitamin D deficiency, unspecified: Secondary | ICD-10-CM

## 2016-11-11 DIAGNOSIS — E1122 Type 2 diabetes mellitus with diabetic chronic kidney disease: Secondary | ICD-10-CM | POA: Diagnosis not present

## 2016-11-11 LAB — POCT GLYCOSYLATED HEMOGLOBIN (HGB A1C): Hemoglobin A1C: 8.6

## 2016-11-11 MED ORDER — EMPAGLIFLOZIN-METFORMIN HCL 12.5-1000 MG PO TABS: 1.0000 | ORAL_TABLET | Freq: Two times a day (BID) | ORAL | 3 refills | Status: DC

## 2016-11-11 MED ORDER — RANITIDINE HCL 150 MG PO TABS: 150.0000 mg | ORAL_TABLET | Freq: Two times a day (BID) | ORAL | 1 refills | Status: DC

## 2016-11-11 MED ORDER — HYDROCHLOROTHIAZIDE 25 MG PO TABS: 25.0000 mg | ORAL_TABLET | Freq: Every day | ORAL | 1 refills | Status: DC

## 2016-11-11 MED ORDER — FLUTICASONE-SALMETEROL 100-50 MCG/DOSE IN AEPB: 1.0000 | INHALATION_SPRAY | Freq: Two times a day (BID) | RESPIRATORY_TRACT | 5 refills | Status: DC

## 2016-11-11 MED ORDER — GLIPIZIDE ER 5 MG PO TB24: 5.0000 mg | ORAL_TABLET | Freq: Every day | ORAL | 1 refills | Status: DC

## 2016-11-11 MED ORDER — LORCASERIN HCL ER 20 MG PO TB24: 1.0000 | ORAL_TABLET | Freq: Every day | ORAL | 2 refills | Status: DC

## 2016-11-11 MED ORDER — FLUTICASONE PROPIONATE 50 MCG/ACT NA SUSP: 2.0000 | Freq: Every day | NASAL | 6 refills | Status: DC

## 2016-11-11 MED ORDER — ATORVASTATIN CALCIUM 80 MG PO TABS: 80.0000 mg | ORAL_TABLET | Freq: Every day | ORAL | 1 refills | Status: DC

## 2016-11-11 MED ORDER — LISINOPRIL 40 MG PO TABS: 40.0000 mg | ORAL_TABLET | Freq: Every day | ORAL | 1 refills | Status: DC

## 2016-11-11 NOTE — Progress Notes (Signed)
Name: Cindy Griffin   MRN: [id number]    DOB: 1946/10/07   Date:11/11/2016       Progress Note  Subjective  Chief Complaint  Chief Complaint  Patient presents with  . Diabetes    4 month follow up  . Obesity  . Insomnia    HPI  DMII with nephropathy: she is taking Synjardi and is off Glipizide, no side effects. She is eating a lot of bread again and  hgbA1C went from  7.5% to 8.1% up to 8.6% down to 8.0% and up again to 8.6%. She denies polydipsia, polyphagia or polyuria. FSBS: 143-183 - not always fasting. She is due for an eye exam.  HTN: taking medication and denies side effects of medication. No chest pain or palpitation. She has some episodes of dizziness when she gets up, we changed Lisinopril to 40 mg daily on her last visit, but pharmacy dispensed for two pills a day so she is still taking it twice daily.   Hyperlipidemia: taking Atorvastatin and denies side effects of medications  Cervical and lumbar radiculitis: seeing Dr. Phyllis Ginger, had a Left L4-5 transforaminal epidural injection, she also had PT. Had left shoulder surgery, tingling on her hands has improved. She also has post-traumatic left  knee OA , she takes Celebrex and has a slow gait and difficulty going up steps because of OA. She brought a handicap placard form to be filled out.   Insomnia: she has difficulty falling and staying asleep, she continues to take Trazodone prn and helps with her symptoms.   Morbid Obesity: she tried Contrave but made her vomit. We started her on Belvig in April 2017 and she lost weight, she was down 22 lbs by June when she had shoulder surgery, but she stopped exercising and taking medication and has gained 12lbs since. She wants to resume Belviq - she will pay cash for it  Chronic Bronchitis: she is doing well at this time, she has chronic cough,  worse in the mornings, that is productive and clear to yellow in color. She is using Advair twice daily but still has some mild SOB  with activity. She used to smoke very little, but was second smoker all her life ( husband, brother's and co-workers)   GERD: symptoms are under control with Omeprazole but discussed potential long term risk and she is willing to change to Ranitidine.     Patient Active Problem List   Diagnosis Date Noted  . Hypercalcemia 11/23/2015  . DDD (degenerative disc disease), lumbar 07/28/2015  . Spinal stenosis at L4-L5 level 06/12/2015  . Benign essential HTN 05/25/2015  . Bunion 05/25/2015  . Cardiac enlargement 05/25/2015  . Cervical radicular pain 05/25/2015  . Back pain, chronic 05/25/2015  . Osteoarthritis 05/25/2015  . Dyslipidemia 05/25/2015  . Gastric reflux 05/25/2015  . Insomnia 05/25/2015  . Eczema intertrigo 05/25/2015  . Bronchitis, chronic, mucopurulent ([place]) 05/25/2015  . Numerous moles 05/25/2015  . Morbid obesity ([place]) 05/25/2015  . Perennial allergic rhinitis 05/25/2015  . DM (diabetes mellitus), type 2, uncontrolled, with renal complications ([place]) 84/69/6295  . Snores 05/25/2015  . History of artificial joint 05/25/2015  . Degeneration of intervertebral disc of cervical region 02/06/2015  . Neuritis or radiculitis due to rupture of lumbar intervertebral disc 01/14/2015  . Abnormal presence of protein in urine 05/04/2010  . Vitamin D deficiency 05/04/2010    Past Surgical History:  Procedure Laterality Date  . ABDOMINAL HYSTERECTOMY     due to cancer-partial  .  BREAST BIOPSY Right    Benign  . COLONOSCOPY N/A 04/11/2015   Procedure: COLONOSCOPY;  Surgeon: Hulen Luster, MD;  Location: [facility] ENDOSCOPY;  Service: Gastroenterology;  Laterality: N/A;  . JOINT REPLACEMENT     left knee 2013 right knee 2005  . SHOULDER ARTHROSCOPY WITH OPEN ROTATOR CUFF REPAIR Left 05/18/2016   Procedure: SHOULDER ARTHROSCOPY WITH OPEN ROTATOR CUFF REPAIR,distal clavicle excision, decompression;  Surgeon: Corky Mull, MD;  Location: [facility] ORS;  Service: Orthopedics;  Laterality: Left;  .  SPINAL FUSION     C-Spine  . Transforaminal Epidural  02/20/2015   Injection into cervical spine- C5-6 Dr. Phyllis Ginger    Family History  Problem Relation Age of Onset  . Diabetes Mother   . Kidney disease Mother   . Hypertension Mother   . Asthma Daughter   . Diabetes Sister   . Diabetes Brother     Social History   Social History  . Marital status: Widowed    Spouse name: N/A  . Number of children: N/A  . Years of education: N/A   Occupational History  . Not on file.   Social History Main Topics  . Smoking status: Never Smoker  . Smokeless tobacco: Never Used  . Alcohol use 0.0 oz/week     Comment: occasionally  . Drug use: No  . Sexual activity: Not Currently   Other Topics Concern  . Not on file   Social History Narrative  . No narrative on file     Current Outpatient Prescriptions:  .  acetaminophen (TYLENOL) 500 MG tablet, Take 1,000 mg by mouth every 6 (six) hours as needed (pain)., Disp: , Rfl:  .  aspirin EC 81 MG tablet, Take 81 mg by mouth daily., Disp: , Rfl:  .  atorvastatin (LIPITOR) 80 MG tablet, Take 1 tablet (80 mg total) by mouth daily., Disp: 90 tablet, Rfl: 1 .  Blood Glucose Monitoring Suppl (ACCU-CHEK NANO SMARTVIEW) W/DEVICE KIT, 1 Device by Does not apply route 3 (three) times daily., Disp: 1 kit, Rfl: 0 .  celecoxib (CELEBREX) 200 MG capsule, Take 1 capsule (200 mg total) by mouth daily., Disp: 90 capsule, Rfl: 1 .  cetirizine (ZYRTEC) 10 MG tablet, Take 1 tablet by mouth daily., Disp: , Rfl:  .  Cholecalciferol (VITAMIN D-3 PO), Take 1,000 Int'l Units by mouth daily., Disp: , Rfl:  .  Empagliflozin-Metformin HCl (SYNJARDY) 12.03-999 MG TABS, Take 1 tablet by mouth 2 (two) times daily. In place of invokana nd metformin, Disp: 30 tablet, Rfl: 3 .  ferrous sulfate 325 (65 FE) MG EC tablet, Take 1 tablet by mouth daily., Disp: , Rfl:  .  fluticasone (FLONASE) 50 MCG/ACT nasal spray, Place 2 sprays into both nostrils daily., Disp: 16 g, Rfl: 6 .   Fluticasone-Salmeterol (ADVAIR) 100-50 MCG/DOSE AEPB, Inhale 1 puff into the lungs 2 (two) times daily., Disp: 60 each, Rfl: 5 .  glipiZIDE (GLIPIZIDE XL) 5 MG 24 hr tablet, Take 1 tablet (5 mg total) by mouth daily with breakfast., Disp: 90 tablet, Rfl: 1 .  glucose blood (ADVOCATE REDI-CODE) test strip, 1 each by Other route QID., Disp: 100 each, Rfl: 12 .  hydrochlorothiazide (HYDRODIURIL) 25 MG tablet, Take 1 tablet (25 mg total) by mouth daily., Disp: 90 tablet, Rfl: 1 .  lisinopril (PRINIVIL,ZESTRIL) 40 MG tablet, Take 1 tablet (40 mg total) by mouth daily. Change in therapy - once a day. At night, Disp: 90 tablet, Rfl: 1 .  Lorcaserin HCl ER (BELVIQ  XR) 20 MG TB24, Take 1 tablet by mouth daily., Disp: 30 tablet, Rfl: 2 .  Multiple Vitamins-Minerals (MULTIVITAMIN PO), Take 1 tablet by mouth daily., Disp: , Rfl:  .  Omega-3 Fatty Acids (FISH OIL BURP-LESS) 500 MG CAPS, Take 1 capsule by mouth daily. , Disp: , Rfl:  .  omeprazole (PRILOSEC) 20 MG capsule, Take 1 capsule (20 mg total) by mouth daily., Disp: 90 capsule, Rfl: 4 .  ranitidine (ZANTAC) 150 MG tablet, Take 1 tablet (150 mg total) by mouth 2 (two) times daily. In place of Omeprazole, Disp: 180 tablet, Rfl: 1 .  tiZANidine (ZANAFLEX) 2 MG tablet, Take 2 mg by mouth daily as needed for muscle spasms. , Disp: , Rfl:  .  traMADol (ULTRAM) 50 MG tablet, Take 1 tablet (50 mg total) by mouth every 6 (six) hours as needed. for pain, Disp: 90 tablet, Rfl: 0 .  traZODone (DESYREL) 50 MG tablet, Take 0.5-1 tablets (25-50 mg total) by mouth at bedtime as needed for sleep., Disp: 90 tablet, Rfl: 1  No Known Allergies   ROS  Constitutional: Negative for fever or weight change.  Respiratory: Positive for cough and shortness of breath.   Cardiovascular: Negative for chest pain or palpitations.  Gastrointestinal: Negative for abdominal pain, no bowel changes.  Musculoskeletal: Positive  for gait problem or joint swelling.  Skin: Negative for  rash.  Neurological: Negative for dizziness or headache.  No other specific complaints in a complete review of systems (except as listed in HPI above).  Objective  Vitals:   11/11/16 1001  BP: 124/66  Pulse: 90  Resp: 16  Temp: 97.9 F (36.6 C)  TempSrc: Oral  SpO2: 96%  Weight: 224 lb 8 oz (101.8 kg)  Height: 5' 4"  (1.626 m)    Body mass index is 38.54 kg/m.  Physical Exam  Constitutional: Patient appears well-developed and well-nourished. Obese  No distress.  HEENT: head atraumatic, normocephalic, pupils equal and reactive to light, neck supple, throat within normal limits Cardiovascular: Normal rate, regular rhythm and normal heart sounds.  No murmur heard. No BLE edema. Pulmonary/Chest: Effort normal and breath sounds normal. No respiratory distress. Abdominal: Soft.  There is no tenderness. Psychiatric: Patient has a normal mood and affect. behavior is normal. Judgment and thought content normal.  PHQ2/9: Depression screen [facility] 2/9 11/11/2016 07/15/2016 03/15/2016 11/21/2015 08/29/2015  Decreased Interest 0 0 0 0 0  Down, Depressed, Hopeless 0 0 0 0 1  PHQ - 2 Score 0 0 0 0 1     Fall Risk: Fall Risk  11/11/2016 07/15/2016 03/15/2016 11/21/2015 08/29/2015  Falls in the past year? Yes No Yes No No  Number falls in past yr: 1 - 1 - -  Injury with Fall? Yes - Yes - -  Follow up Falls evaluation completed - - - -     Functional Status Survey: Is the patient deaf or have difficulty hearing?: No Does the patient have difficulty seeing, even when wearing glasses/contacts?: No Does the patient have difficulty concentrating, remembering, or making decisions?: No Does the patient have difficulty walking or climbing stairs?: Yes Does the patient have difficulty dressing or bathing?: No Does the patient have difficulty doing errands alone such as visiting a doctor's office or shopping?: No   Assessment & Plan  1. Uncontrolled type 2 diabetes mellitus with chronic kidney  disease, without long-term current use of insulin, unspecified CKD stage (HCC)  Resume Glipizide XL she stopped taking it and glucose is up, she  needs to resume diet also, continue Synjardy.  - POCT HgB A1C - Empagliflozin-Metformin HCl (SYNJARDY) 12.03-999 MG TABS; Take 1 tablet by mouth 2 (two) times daily. In place of invokana nd metformin  Dispense: 30 tablet; Refill: 3 - glipiZIDE (GLIPIZIDE XL) 5 MG 24 hr tablet; Take 1 tablet (5 mg total) by mouth daily with breakfast.  Dispense: 90 tablet; Refill: 1 - lisinopril (PRINIVIL,ZESTRIL) 40 MG tablet; Take 1 tablet (40 mg total) by mouth daily. Change in therapy - once a day. At night  Dispense: 90 tablet; Refill: 1  2. Other insomnia  Doing well on Trazodone   3. Benign essential HTN  She is still taking Lisinopril twice daily, we will try decreasing to 40 mg daily  - hydrochlorothiazide (HYDRODIURIL) 25 MG tablet; Take 1 tablet (25 mg total) by mouth daily.  Dispense: 90 tablet; Refill: 1 - lisinopril (PRINIVIL,ZESTRIL) 40 MG tablet; Take 1 tablet (40 mg total) by mouth daily. Change in therapy - once a day. At night  Dispense: 90 tablet; Refill: 1 - COMPLETE METABOLIC PANEL WITH GFR  4. Dyslipidemia  - atorvastatin (LIPITOR) 80 MG tablet; Take 1 tablet (80 mg total) by mouth daily.  Dispense: 90 tablet; Refill: 1 - Lipid panel  5. Morbid obesity due to excess calories [facility])  Discussed with the patient the risk posed by an increased BMI. Discussed importance of portion control, calorie counting and at least 150 minutes of physical activity weekly. Avoid sweet beverages and drink more water. Eat at least 6 servings of fruit and vegetables daily   She wants to resume Belviq 20 mg daily   6. Mucopurulent chronic bronchitis (HCC)  - Fluticasone-Salmeterol (ADVAIR) 100-50 MCG/DOSE AEPB; Inhale 1 puff into the lungs 2 (two) times daily.  Dispense: 60 each; Refill: 5  7. Perennial allergic rhinitis  - fluticasone (FLONASE) 50 MCG/ACT  nasal spray; Place 2 sprays into both nostrils daily.  Dispense: 16 g; Refill: 6  8. Gastric reflux  She is willing to stop Omeprazole, but does not want to get both prescriptions because of cost - ranitidine (ZANTAC) 150 MG tablet; Take 1 tablet (150 mg total) by mouth 2 (two) times daily. In place of Omeprazole  Dispense: 180 tablet; Refill: 1  9. Vitamin D deficiency  - VITAMIN D 25 Hydroxy (Vit-D Deficiency, Fractures)

## 2016-11-12 ENCOUNTER — Encounter: Payer: Self-pay | Admitting: Family Medicine

## 2016-11-15 ENCOUNTER — Other Ambulatory Visit: Payer: Self-pay | Admitting: Family Medicine

## 2016-11-15 DIAGNOSIS — I10 Essential (primary) hypertension: Secondary | ICD-10-CM | POA: Diagnosis not present

## 2016-11-15 DIAGNOSIS — E785 Hyperlipidemia, unspecified: Secondary | ICD-10-CM | POA: Diagnosis not present

## 2016-11-15 DIAGNOSIS — M153 Secondary multiple arthritis: Secondary | ICD-10-CM

## 2016-11-15 DIAGNOSIS — E559 Vitamin D deficiency, unspecified: Secondary | ICD-10-CM | POA: Diagnosis not present

## 2016-11-15 LAB — CBC WITH DIFFERENTIAL/PLATELET
Basophils Absolute: 0 cells/uL (ref 0–200)
Basophils Relative: 0 %
Eosinophils Absolute: 74 cells/uL (ref 15–500)
Eosinophils Relative: 1 %
HCT: 39.2 % (ref 35.0–45.0)
Hemoglobin: 12.8 g/dL (ref 11.7–15.5)
Lymphocytes Relative: 27 %
Lymphs Abs: 1998 cells/uL (ref 850–3900)
MCH: 28 pg (ref 27.0–33.0)
MCHC: 32.7 g/dL (ref 32.0–36.0)
MCV: 85.8 fL (ref 80.0–100.0)
MPV: 9.5 fL (ref 7.5–12.5)
Monocytes Absolute: 592 cells/uL (ref 200–950)
Monocytes Relative: 8 %
Neutro Abs: 4736 cells/uL (ref 1500–7800)
Neutrophils Relative %: 64 %
Platelets: 310 10*3/uL (ref 140–400)
RBC: 4.57 MIL/uL (ref 3.80–5.10)
RDW: 15.3 % — ABNORMAL HIGH (ref 11.0–15.0)
WBC: 7.4 10*3/uL (ref 3.8–10.8)

## 2016-11-15 NOTE — Telephone Encounter (Signed)
Pt stated that she was missing a refill on her celebrex

## 2016-11-16 LAB — COMPLETE METABOLIC PANEL WITH GFR
ALT: 13 U/L (ref 6–29)
AST: 12 U/L (ref 10–35)
Albumin: 4.3 g/dL (ref 3.6–5.1)
Alkaline Phosphatase: 65 U/L (ref 33–130)
BUN: 28 mg/dL — ABNORMAL HIGH (ref 7–25)
CO2: 28 mmol/L (ref 20–31)
Calcium: 9.7 mg/dL (ref 8.6–10.4)
Chloride: 102 mmol/L (ref 98–110)
Creat: 0.96 mg/dL — ABNORMAL HIGH (ref 0.60–0.93)
GFR, Est African American: 69 mL/min (ref >=60)
GFR, Est Non African American: 60 mL/min (ref >=60)
Glucose, Bld: 178 mg/dL — ABNORMAL HIGH (ref 65–99)
Potassium: 4.6 mmol/L (ref 3.5–5.3)
Sodium: 139 mmol/L (ref 135–146)
Total Bilirubin: 0.4 mg/dL (ref 0.2–1.2)
Total Protein: 6.9 g/dL (ref 6.1–8.1)

## 2016-11-16 LAB — LIPID PANEL
Cholesterol: 131 mg/dL (ref <200)
HDL: 54 mg/dL (ref >50)
LDL Cholesterol: 57 mg/dL (ref <100)
Total CHOL/HDL Ratio: 2.4 Ratio (ref <5.0)
Triglycerides: 100 mg/dL (ref <150)
VLDL: 20 mg/dL (ref <30)

## 2016-11-16 LAB — VITAMIN D 25 HYDROXY (VIT D DEFICIENCY, FRACTURES): Vit D, 25-Hydroxy: 42 ng/mL (ref 30–100)

## 2016-11-18 DIAGNOSIS — S82102D Unspecified fracture of upper end of left tibia, subsequent encounter for closed fracture with routine healing: Secondary | ICD-10-CM | POA: Diagnosis not present

## 2016-11-18 DIAGNOSIS — X58XXXD Exposure to other specified factors, subsequent encounter: Secondary | ICD-10-CM | POA: Diagnosis not present

## 2016-11-18 DIAGNOSIS — Z96652 Presence of left artificial knee joint: Secondary | ICD-10-CM | POA: Diagnosis not present

## 2016-11-18 DIAGNOSIS — Z96651 Presence of right artificial knee joint: Secondary | ICD-10-CM | POA: Diagnosis not present

## 2016-11-18 DIAGNOSIS — Z471 Aftercare following joint replacement surgery: Secondary | ICD-10-CM | POA: Diagnosis not present

## 2016-11-18 DIAGNOSIS — Z96653 Presence of artificial knee joint, bilateral: Secondary | ICD-10-CM | POA: Diagnosis not present

## 2016-12-20 DIAGNOSIS — E119 Type 2 diabetes mellitus without complications: Secondary | ICD-10-CM | POA: Diagnosis not present

## 2016-12-20 LAB — HM DIABETES EYE EXAM

## 2016-12-24 ENCOUNTER — Encounter: Payer: Self-pay | Admitting: Family Medicine

## 2017-02-07 DIAGNOSIS — M19012 Primary osteoarthritis, left shoulder: Secondary | ICD-10-CM | POA: Diagnosis not present

## 2017-02-07 DIAGNOSIS — M5030 Other cervical disc degeneration, unspecified cervical region: Secondary | ICD-10-CM | POA: Diagnosis not present

## 2017-02-07 DIAGNOSIS — M7582 Other shoulder lesions, left shoulder: Secondary | ICD-10-CM | POA: Diagnosis not present

## 2017-02-07 DIAGNOSIS — M542 Cervicalgia: Secondary | ICD-10-CM | POA: Diagnosis not present

## 2017-02-07 DIAGNOSIS — M75122 Complete rotator cuff tear or rupture of left shoulder, not specified as traumatic: Secondary | ICD-10-CM | POA: Diagnosis not present

## 2017-02-07 DIAGNOSIS — M5412 Radiculopathy, cervical region: Secondary | ICD-10-CM | POA: Diagnosis not present

## 2017-02-07 DIAGNOSIS — M24112 Other articular cartilage disorders, left shoulder: Secondary | ICD-10-CM | POA: Diagnosis not present

## 2017-03-08 ENCOUNTER — Other Ambulatory Visit: Payer: Self-pay | Admitting: Family Medicine

## 2017-03-09 NOTE — Telephone Encounter (Signed)
Patient requesting refill of Tramadol to [facility].

## 2017-03-14 ENCOUNTER — Encounter: Payer: Self-pay | Admitting: Family Medicine

## 2017-03-14 ENCOUNTER — Ambulatory Visit (INDEPENDENT_AMBULATORY_CARE_PROVIDER_SITE_OTHER): Payer: PPO | Admitting: Family Medicine

## 2017-03-14 VITALS — BP 116/74 | HR 86 | Temp 97.8°F | Resp 18 | Ht 64.0 in | Wt 225.2 lb

## 2017-03-14 DIAGNOSIS — E1165 Type 2 diabetes mellitus with hyperglycemia: Secondary | ICD-10-CM

## 2017-03-14 DIAGNOSIS — J411 Mucopurulent chronic bronchitis: Secondary | ICD-10-CM | POA: Diagnosis not present

## 2017-03-14 DIAGNOSIS — E1122 Type 2 diabetes mellitus with diabetic chronic kidney disease: Secondary | ICD-10-CM

## 2017-03-14 DIAGNOSIS — K219 Gastro-esophageal reflux disease without esophagitis: Secondary | ICD-10-CM | POA: Diagnosis not present

## 2017-03-14 DIAGNOSIS — E785 Hyperlipidemia, unspecified: Secondary | ICD-10-CM

## 2017-03-14 DIAGNOSIS — M5412 Radiculopathy, cervical region: Secondary | ICD-10-CM | POA: Diagnosis not present

## 2017-03-14 DIAGNOSIS — G4709 Other insomnia: Secondary | ICD-10-CM

## 2017-03-14 DIAGNOSIS — I10 Essential (primary) hypertension: Secondary | ICD-10-CM

## 2017-03-14 LAB — POCT GLYCOSYLATED HEMOGLOBIN (HGB A1C): Hemoglobin A1C: 8.1

## 2017-03-14 MED ORDER — LIRAGLUTIDE 18 MG/3ML ~~LOC~~ SOPN: 0.6000 mg | PEN_INJECTOR | Freq: Every day | SUBCUTANEOUS | 3 refills | Status: DC

## 2017-03-14 MED ORDER — TRAMADOL HCL 50 MG PO TABS: 50.0000 mg | ORAL_TABLET | Freq: Four times a day (QID) | ORAL | 0 refills | Status: DC | PRN

## 2017-03-14 MED ORDER — EMPAGLIFLOZIN-METFORMIN HCL ER 12.5-1000 MG PO TB24: 1.0000 | ORAL_TABLET | Freq: Two times a day (BID) | ORAL | 3 refills | Status: DC

## 2017-03-14 MED ORDER — INSULIN PEN NEEDLE 30G X 8 MM MISC: 1.0000 | 0 refills | Status: DC | PRN

## 2017-03-14 NOTE — Progress Notes (Addendum)
Name: Cindy Griffin   MRN: [id number]    DOB: [birth year]   Date:03/14/2017       Progress Note  Subjective  Chief Complaint  Chief Complaint  Patient presents with  . Medication Refill    4 month F/U  . Diabetes    Checks 2-3x daily, Lowest-83 Average-120-140's Highest-250  . Hypertension    Dizziness from neck pain  . Hyperlipidemia  . Gastroesophageal Reflux    Controlled  . Obesity    Unchanged  . Neck Pain    Still seeing Dr. Mancel Parsons for injections, had imaging done and showed nerve is closing back up. Needs refill of Tramadol.  . Allergies    Takes medication, having headaches due to allergies.     HPI  DMII with nephropathy: she is taking Synjardi and was off Glipizide but hgbA1C went up to 8.6% and she is back on it. She has changed her diet and is down to 8.1% , she states she is willing to add another medication. Explained that Victoza can help with weight and also DM. She denies polydipsia, polyphagiaor polyuria. FSBS average 140's fasting.   HTN: taking medication. No chest pain or palpitation. She has some episodes of dizziness when she gets up, we changed Lisinopril to 40 mg daily on her last visit, she does not want to go down on medication at this time.  Hyperlipidemia: taking Atorvastatin and denies side effects of medications  Cervical and lumbar radiculitis: seeing Dr. Phyllis Ginger, had a Left L4-5 transforaminal epidural injection, she also had PT. Had left shoulder surgery, tingling on her hands has improved, but neck is getting worse and has numbness on shoulder area, going back to see Dr. Phyllis Ginger. She also has post-traumatic left  knee OA , she takes Celebrex and has a slow gait and difficulty going up steps because of OA.   Insomnia: she has difficulty falling and staying asleep, she continues to take Trazodone prn and helps with her symptoms.   Morbid Obesity: she tried Contrave but made her vomit. We started her on Belvig in April 2017 and she lost  weight, she was down 22 lbs by June when she had shoulder surgery, but she stopped exercising and taking medication and had gained 12lbs, but is back on Belviq and weight is stable now, we will try changing to Victoza.   Chronic Bronchitis: she is having more cough and SOB, she states pollen seems to make it worse. She used to smoke very little, but was second smoker all her life ( husband, brother's and co-workers)   GERD: symptoms are under control with Omeprazole but discussed potential long term risk, we gave Ranitidine to help her wean off Omeprazole but she is not sure which one she is taking.    Patient Active Problem List   Diagnosis Date Noted  . Hypercalcemia 11/23/2015  . DDD (degenerative disc disease), lumbar 07/28/2015  . Spinal stenosis at L4-L5 level 06/12/2015  . Benign essential HTN 05/25/2015  . Bunion 05/25/2015  . Cardiac enlargement 05/25/2015  . Cervical radicular pain 05/25/2015  . Back pain, chronic 05/25/2015  . Osteoarthritis 05/25/2015  . Dyslipidemia 05/25/2015  . Gastric reflux 05/25/2015  . Insomnia 05/25/2015  . Eczema intertrigo 05/25/2015  . Bronchitis, chronic, mucopurulent ([place]) 05/25/2015  . Numerous moles 05/25/2015  . Morbid obesity ([place]) 05/25/2015  . Perennial allergic rhinitis 05/25/2015  . DM (diabetes mellitus), type 2, uncontrolled, with renal complications ([place]) 45/80/9983  . Snores 05/25/2015  . History of artificial  joint 05/25/2015  . Degeneration of intervertebral disc of cervical region 02/06/2015  . Neuritis or radiculitis due to rupture of lumbar intervertebral disc 01/14/2015  . Abnormal presence of protein in urine 05/04/2010  . Vitamin D deficiency 05/04/2010    Past Surgical History:  Procedure Laterality Date  . ABDOMINAL HYSTERECTOMY     due to cancer-partial  . BREAST BIOPSY Right    Benign  . COLONOSCOPY N/A 04/11/2015   Procedure: COLONOSCOPY;  Surgeon: Hulen Luster, MD;  Location: [facility] ENDOSCOPY;  Service:  Gastroenterology;  Laterality: N/A;  . JOINT REPLACEMENT     left knee 2013 right knee 2005  . SHOULDER ARTHROSCOPY WITH OPEN ROTATOR CUFF REPAIR Left 05/18/2016   Procedure: SHOULDER ARTHROSCOPY WITH OPEN ROTATOR CUFF REPAIR,distal clavicle excision, decompression;  Surgeon: Corky Mull, MD;  Location: [facility] ORS;  Service: Orthopedics;  Laterality: Left;  . SPINAL FUSION     C-Spine  . Transforaminal Epidural  02/20/2015   Injection into cervical spine- C5-6 Dr. Phyllis Ginger    Family History  Problem Relation Age of Onset  . Diabetes Mother   . Kidney disease Mother   . Hypertension Mother   . Asthma Daughter   . Diabetes Sister   . Diabetes Brother     Social History   Social History  . Marital status: Widowed    Spouse name: N/A  . Number of children: N/A  . Years of education: N/A   Occupational History  . Not on file.   Social History Main Topics  . Smoking status: Never Smoker  . Smokeless tobacco: Never Used  . Alcohol use 0.0 oz/week     Comment: occasionally  . Drug use: No  . Sexual activity: Not Currently   Other Topics Concern  . Not on file   Social History Narrative  . No narrative on file     Current Outpatient Prescriptions:  .  acetaminophen (TYLENOL) 500 MG tablet, Take 1,000 mg by mouth every 6 (six) hours as needed (pain)., Disp: , Rfl:  .  aspirin EC 81 MG tablet, Take 81 mg by mouth daily., Disp: , Rfl:  .  atorvastatin (LIPITOR) 80 MG tablet, Take 1 tablet (80 mg total) by mouth daily., Disp: 90 tablet, Rfl: 1 .  Blood Glucose Monitoring Suppl (ACCU-CHEK NANO SMARTVIEW) W/DEVICE KIT, 1 Device by Does not apply route 3 (three) times daily., Disp: 1 kit, Rfl: 0 .  celecoxib (CELEBREX) 200 MG capsule, TAKE ONE CAPSULE BY MOUTH ONCE DAILY, Disp: 90 capsule, Rfl: 1 .  cetirizine (ZYRTEC) 10 MG tablet, Take 1 tablet by mouth daily., Disp: , Rfl:  .  Cholecalciferol (VITAMIN D-3 PO), Take 1,000 Int'l Units by mouth daily., Disp: , Rfl:  .   Empagliflozin-Metformin HCl ER (SYNJARDY XR) 12.03-999 MG TB24, Take 1 tablet by mouth 2 (two) times daily., Disp: 60 tablet, Rfl: 3 .  ferrous sulfate 325 (65 FE) MG EC tablet, Take 1 tablet by mouth daily., Disp: , Rfl:  .  fluticasone (FLONASE) 50 MCG/ACT nasal spray, Place 2 sprays into both nostrils daily., Disp: 16 g, Rfl: 6 .  Fluticasone-Salmeterol (ADVAIR) 100-50 MCG/DOSE AEPB, Inhale 1 puff into the lungs 2 (two) times daily., Disp: 60 each, Rfl: 5 .  glipiZIDE (GLIPIZIDE XL) 5 MG 24 hr tablet, Take 1 tablet (5 mg total) by mouth daily with breakfast., Disp: 90 tablet, Rfl: 1 .  glucose blood (ADVOCATE REDI-CODE) test strip, 1 each by Other route QID., Disp: 100 each, Rfl: 12 .  hydrochlorothiazide (HYDRODIURIL) 25 MG tablet, Take 1 tablet (25 mg total) by mouth daily., Disp: 90 tablet, Rfl: 1 .  lisinopril (PRINIVIL,ZESTRIL) 40 MG tablet, Take 1 tablet (40 mg total) by mouth daily. Change in therapy - once a day. At night, Disp: 90 tablet, Rfl: 1 .  Multiple Vitamins-Minerals (MULTIVITAMIN PO), Take 1 tablet by mouth daily., Disp: , Rfl:  .  Omega-3 Fatty Acids (FISH OIL BURP-LESS) 500 MG CAPS, Take 1 capsule by mouth daily. , Disp: , Rfl:  .  omeprazole (PRILOSEC) 20 MG capsule, Take 1 capsule (20 mg total) by mouth daily., Disp: 90 capsule, Rfl: 4 .  ranitidine (ZANTAC) 150 MG tablet, Take 1 tablet (150 mg total) by mouth 2 (two) times daily. In place of Omeprazole, Disp: 180 tablet, Rfl: 1 .  tiZANidine (ZANAFLEX) 2 MG tablet, Take 2 mg by mouth daily as needed for muscle spasms. , Disp: , Rfl:  .  traMADol (ULTRAM) 50 MG tablet, Take 1 tablet (50 mg total) by mouth every 6 (six) hours as needed. for pain, Disp: 90 tablet, Rfl: 0 .  traZODone (DESYREL) 50 MG tablet, Take 0.5-1 tablets (25-50 mg total) by mouth at bedtime as needed for sleep., Disp: 90 tablet, Rfl: 1 .  Insulin Pen Needle (NOVOFINE) 30G X 8 MM MISC, Inject 10 each into the skin as needed., Disp: 100 each, Rfl: 0 .   liraglutide (VICTOZA) 18 MG/3ML SOPN, Inject 0.1-0.3 mLs (0.6-1.8 mg total) into the skin daily. Start at 0.6 mg and increase to 1.2 on second week stay at 1.8 after 3rd week, Disp: 9 mL, Rfl: 3  No Known Allergies   ROS  Constitutional: Negative for fever or weight change.  Respiratory: Positive for intermittent cough and shortness of breath.   Cardiovascular: Negative for chest pain or palpitations.  Gastrointestinal: Negative for abdominal pain, no bowel changes.  Musculoskeletal: Positive for gait problem and some  joint swelling.  Skin: Negative for rash.  Neurological: Negative for dizziness or headache.  No other specific complaints in a complete review of systems (except as listed in HPI above).  Objective  Vitals:   03/14/17 0907  BP: 116/74  Pulse: 86  Resp: 18  Temp: 97.8 F (36.6 C)  TempSrc: Oral  SpO2: 95%  Weight: 225 lb 3.2 oz (102.2 kg)  Height: _0  (1.626 m)    Body mass index is 38.66 kg/m.  Physical Exam  Constitutional: Patient appears well-developed and well-nourished. Obese  No distress.  HEENT: head atraumatic, normocephalic, pupils equal and reactive to light, neck supple, throat within normal limits Cardiovascular: Normal rate, regular rhythm and normal heart sounds.  No murmur heard. No BLE edema. Pulmonary/Chest: Effort normal and breath sounds normal. No respiratory distress. Abdominal: Soft.  There is no tenderness. Psychiatric: Patient has a normal mood and affect. behavior is normal. Judgment and thought content normal. Muscular Skeletal: pain with rom , worse when loosing to left side, crepitus with extension of both knees, left worse than right   Recent Results (from the past 2160 hour(s))  HM DIABETES EYE EXAM     Status: None   Collection Time: 12/20/16 12:00 AM  Result Value Ref Range   HM Diabetic Eye Exam No Retinopathy No Retinopathy    Comment: [facility]-Dr. Dingeldein  POCT HgB A1C     Status: Abnormal    Collection Time: 03/14/17  9:13 AM  Result Value Ref Range   Hemoglobin A1C 8.1      PHQ2/9: Depression  screen [facility] 2/9 03/14/2017 11/11/2016 07/15/2016 03/15/2016 11/21/2015  Decreased Interest 0 0 0 0 0  Down, Depressed, Hopeless 0 0 0 0 0  PHQ - 2 Score 0 0 0 0 0     Fall Risk: Fall Risk  03/14/2017 11/11/2016 07/15/2016 03/15/2016 11/21/2015  Falls in the past year? No Yes No Yes No  Number falls in past yr: - 1 - 1 -  Injury with Fall? - Yes - Yes -  Follow up - Falls evaluation completed - - -     Functional Status Survey: Is the patient deaf or have difficulty hearing?: No Does the patient have difficulty seeing, even when wearing glasses/contacts?: No Does the patient have difficulty concentrating, remembering, or making decisions?: No Does the patient have difficulty walking or climbing stairs?: No Does the patient have difficulty dressing or bathing?: No Does the patient have difficulty doing errands alone such as visiting a doctor's office or shopping?: No   Assessment & Plan  1. Uncontrolled type 2 diabetes mellitus with chronic kidney disease, without long-term current use of insulin, unspecified CKD stage (HCC)  - POCT HgB A1C - liraglutide (VICTOZA) 18 MG/3ML SOPN; Inject 0.1-0.3 mLs (0.6-1.8 mg total) into the skin daily. Start at 0.6 mg and increase to 1.2 on second week stay at 1.8 after 3rd week  Dispense: 9 mL; Refill: 3 - Empagliflozin-Metformin HCl ER (SYNJARDY XR) 12.03-999 MG TB24; Take 1 tablet by mouth 2 (two) times daily.  Dispense: 60 tablet; Refill: 3 - Insulin Pen Needle (NOVOFINE) 30G X 8 MM MISC; Inject 10 each into the skin as needed.  Dispense: 100 each; Refill: 0  2. Other insomnia  Taking medication occasionally   3. Benign essential HTN  She is having some orthostatic changes but she does not want to decrease medication   4. Dyslipidemia  Continue statin medication   5. Morbid obesity due to excess calories (HCC)  Weight is  stable Discussed with the patient the risk posed by an increased BMI. Discussed importance of portion control, calorie counting and at least 150 minutes of physical activity weekly. Avoid sweet beverages and drink more water. Eat at least 6 servings of fruit and vegetables daily   6. Mucopurulent chronic bronchitis ([place])  Some SOB last week, taking medication as prescribed  7. Gastric reflux  stable  8. Cervical radicular pain  Had repeat CXR and needs refill of Tramadol - traMADol (ULTRAM) 50 MG tablet; Take 1 tablet (50 mg total) by mouth every 6 (six) hours as needed. for pain  Dispense: 90 tablet; Refill: 0

## 2017-03-16 ENCOUNTER — Telehealth: Payer: Self-pay | Admitting: Family Medicine

## 2017-03-16 NOTE — Telephone Encounter (Signed)
Pt is asking for SOMETHING TO REPLACE SYNJARDY FOR IT IS GOING TO COST 85.00 INSTEAD OF 45.00. NEEDS TEER 2 MEDICATION.

## 2017-03-17 NOTE — Telephone Encounter (Signed)
She needs to bring a list of the medications and tier.

## 2017-03-17 NOTE — Telephone Encounter (Signed)
Pt stated that she will drop off the book today after 12 which have the approved medications and the tier

## 2017-03-21 MED ORDER — METFORMIN HCL ER 750 MG PO TB24: 1500.0000 mg | ORAL_TABLET | Freq: Every day | ORAL | 3 refills | Status: DC

## 2017-03-21 NOTE — Telephone Encounter (Signed)
From the [employer] website:  Need Assistance? We're committed to providing excellent customer services to our members and their designees. Questions? Call a [place] at [phone]), seven days a week, between 8 a.m. to 8 p.m. Or, send [place] an email using the form below.

## 2017-03-21 NOTE — Telephone Encounter (Signed)
Nothing is really good on her tier 2 . What can we do ? Can she see if she qualifies for assistance?

## 2017-03-21 NOTE — Telephone Encounter (Signed)
Changed to metformin

## 2017-03-21 NOTE — Telephone Encounter (Signed)
Pt stated that she has been out of her synjardy for 3 days and wants to know what she can take in place of it, I gave her the number to call to see if she could get any assistance

## 2017-03-23 DIAGNOSIS — M5412 Radiculopathy, cervical region: Secondary | ICD-10-CM | POA: Diagnosis not present

## 2017-03-23 DIAGNOSIS — M4802 Spinal stenosis, cervical region: Secondary | ICD-10-CM | POA: Diagnosis not present

## 2017-03-23 DIAGNOSIS — M5030 Other cervical disc degeneration, unspecified cervical region: Secondary | ICD-10-CM | POA: Diagnosis not present

## 2017-04-29 ENCOUNTER — Encounter: Payer: Self-pay | Admitting: Family Medicine

## 2017-04-29 ENCOUNTER — Other Ambulatory Visit: Payer: Self-pay | Admitting: Family Medicine

## 2017-04-29 DIAGNOSIS — E1122 Type 2 diabetes mellitus with diabetic chronic kidney disease: Secondary | ICD-10-CM

## 2017-04-29 DIAGNOSIS — M153 Secondary multiple arthritis: Secondary | ICD-10-CM

## 2017-04-29 DIAGNOSIS — E1165 Type 2 diabetes mellitus with hyperglycemia: Principal | ICD-10-CM

## 2017-04-29 NOTE — Telephone Encounter (Signed)
Patient requesting refill of Celebrex and Glipizide to [facility].

## 2017-05-04 ENCOUNTER — Other Ambulatory Visit: Payer: Self-pay

## 2017-05-04 NOTE — Telephone Encounter (Signed)
Please call patient and let her know she was given 3 refills on 03/21/2017 when Dr. Ancil Boozer sent in her Metformin. She should still have some with the Pharmacy. Thank you!

## 2017-05-04 NOTE — Telephone Encounter (Signed)
Patient requesting refill of Metformin to [facility].

## 2017-05-13 ENCOUNTER — Other Ambulatory Visit: Payer: Self-pay | Admitting: Family Medicine

## 2017-05-13 DIAGNOSIS — K219 Gastro-esophageal reflux disease without esophagitis: Secondary | ICD-10-CM

## 2017-05-13 NOTE — Telephone Encounter (Signed)
Patient requesting refill of Ranitidine to [facility].

## 2017-05-17 DIAGNOSIS — M5030 Other cervical disc degeneration, unspecified cervical region: Secondary | ICD-10-CM | POA: Diagnosis not present

## 2017-05-17 DIAGNOSIS — M4802 Spinal stenosis, cervical region: Secondary | ICD-10-CM | POA: Diagnosis not present

## 2017-05-17 DIAGNOSIS — M5412 Radiculopathy, cervical region: Secondary | ICD-10-CM | POA: Diagnosis not present

## 2017-06-16 ENCOUNTER — Telehealth: Payer: Self-pay

## 2017-06-16 DIAGNOSIS — E1165 Type 2 diabetes mellitus with hyperglycemia: Principal | ICD-10-CM

## 2017-06-16 DIAGNOSIS — E1122 Type 2 diabetes mellitus with diabetic chronic kidney disease: Secondary | ICD-10-CM

## 2017-06-16 MED ORDER — GLIPIZIDE ER 10 MG PO TB24: 10.0000 mg | ORAL_TABLET | Freq: Every day | ORAL | 0 refills | Status: DC

## 2017-06-16 NOTE — Telephone Encounter (Signed)
Increased dose of Glipizide to 10 mg

## 2017-06-16 NOTE — Telephone Encounter (Signed)
Pt stopped by office and informed me that she is in the doughnut hole and that her victoza will cost her too much and she can not afford it. She would like for you to order her something else or to increase her other diabetes medications

## 2017-07-01 DIAGNOSIS — M5030 Other cervical disc degeneration, unspecified cervical region: Secondary | ICD-10-CM | POA: Diagnosis not present

## 2017-07-01 DIAGNOSIS — M4802 Spinal stenosis, cervical region: Secondary | ICD-10-CM | POA: Diagnosis not present

## 2017-07-01 DIAGNOSIS — M5412 Radiculopathy, cervical region: Secondary | ICD-10-CM | POA: Diagnosis not present

## 2017-07-15 ENCOUNTER — Ambulatory Visit (INDEPENDENT_AMBULATORY_CARE_PROVIDER_SITE_OTHER): Payer: PPO | Admitting: Family Medicine

## 2017-07-15 ENCOUNTER — Encounter: Payer: Self-pay | Admitting: Family Medicine

## 2017-07-15 VITALS — BP 125/73 | HR 94 | Temp 97.6°F | Resp 17 | Ht 64.0 in | Wt 223.1 lb

## 2017-07-15 DIAGNOSIS — R1011 Right upper quadrant pain: Secondary | ICD-10-CM

## 2017-07-15 DIAGNOSIS — M5412 Radiculopathy, cervical region: Secondary | ICD-10-CM

## 2017-07-15 DIAGNOSIS — E559 Vitamin D deficiency, unspecified: Secondary | ICD-10-CM

## 2017-07-15 DIAGNOSIS — K219 Gastro-esophageal reflux disease without esophagitis: Secondary | ICD-10-CM

## 2017-07-15 DIAGNOSIS — Z79899 Other long term (current) drug therapy: Secondary | ICD-10-CM | POA: Diagnosis not present

## 2017-07-15 DIAGNOSIS — G4709 Other insomnia: Secondary | ICD-10-CM | POA: Diagnosis not present

## 2017-07-15 DIAGNOSIS — J411 Mucopurulent chronic bronchitis: Secondary | ICD-10-CM

## 2017-07-15 DIAGNOSIS — E1122 Type 2 diabetes mellitus with diabetic chronic kidney disease: Secondary | ICD-10-CM | POA: Diagnosis not present

## 2017-07-15 DIAGNOSIS — E785 Hyperlipidemia, unspecified: Secondary | ICD-10-CM

## 2017-07-15 DIAGNOSIS — I10 Essential (primary) hypertension: Secondary | ICD-10-CM

## 2017-07-15 DIAGNOSIS — E1165 Type 2 diabetes mellitus with hyperglycemia: Secondary | ICD-10-CM | POA: Diagnosis not present

## 2017-07-15 LAB — CBC WITH DIFFERENTIAL/PLATELET
Basophils Absolute: 78 cells/uL (ref 0–200)
Basophils Relative: 1 %
Eosinophils Absolute: 156 cells/uL (ref 15–500)
Eosinophils Relative: 2 %
HCT: 40.2 % (ref 35.0–45.0)
Hemoglobin: 13.1 g/dL (ref 11.7–15.5)
Lymphocytes Relative: 24 %
Lymphs Abs: 1872 cells/uL (ref 850–3900)
MCH: 28.9 pg (ref 27.0–33.0)
MCHC: 32.6 g/dL (ref 32.0–36.0)
MCV: 88.5 fL (ref 80.0–100.0)
MPV: 9.8 fL (ref 7.5–12.5)
Monocytes Absolute: 390 cells/uL (ref 200–950)
Monocytes Relative: 5 %
Neutro Abs: 5304 cells/uL (ref 1500–7800)
Neutrophils Relative %: 68 %
Platelets: 300 10*3/uL (ref 140–400)
RBC: 4.54 MIL/uL (ref 3.80–5.10)
RDW: 15.3 % — ABNORMAL HIGH (ref 11.0–15.0)
WBC: 7.8 10*3/uL (ref 3.8–10.8)

## 2017-07-15 LAB — POCT GLYCOSYLATED HEMOGLOBIN (HGB A1C): Hemoglobin A1C: 8.5

## 2017-07-15 LAB — GLUCOSE, POCT (MANUAL RESULT ENTRY): POC Glucose: 250 mg/dl — AB (ref 70–99)

## 2017-07-15 MED ORDER — METFORMIN HCL ER 750 MG PO TB24: 1500.0000 mg | ORAL_TABLET | Freq: Every day | ORAL | 1 refills | Status: DC

## 2017-07-15 MED ORDER — PIOGLITAZONE HCL 15 MG PO TABS: 15.0000 mg | ORAL_TABLET | Freq: Every day | ORAL | 1 refills | Status: DC

## 2017-07-15 MED ORDER — ASPIRIN EC 81 MG PO TBEC: 81.0000 mg | DELAYED_RELEASE_TABLET | Freq: Every day | ORAL | 1 refills | Status: AC

## 2017-07-15 MED ORDER — TRAMADOL HCL 50 MG PO TABS: 50.0000 mg | ORAL_TABLET | Freq: Four times a day (QID) | ORAL | 0 refills | Status: DC | PRN

## 2017-07-15 MED ORDER — FLUTICASONE-SALMETEROL 100-50 MCG/DOSE IN AEPB: 1.0000 | INHALATION_SPRAY | Freq: Two times a day (BID) | RESPIRATORY_TRACT | 5 refills | Status: DC

## 2017-07-15 MED ORDER — GLIPIZIDE ER 10 MG PO TB24: 10.0000 mg | ORAL_TABLET | Freq: Every day | ORAL | 0 refills | Status: DC

## 2017-07-15 NOTE — Progress Notes (Signed)
Name: Cindy Griffin   MRN: [id number]    DOB: Jun 25, 1946   Date:07/18/2017       Progress Note  Subjective  Chief Complaint  Chief Complaint  Patient presents with  . Diabetes    4 month follow up  . Insomnia  . Obesity  . Hyperlipidemia    HPI   DMII with nephropathy: she is cannot afford brand medications. We tried Synjardi and Victoza but had to stop because of cost. She is currently on Glipizide XL93m  and Metformin 1500 mg daily  but hgbA1C and stopped Victoza a few weeks ago. Also had a steroid injection a couple of weeks ago and glucose has been running high. Fasting 170's-200's. She would like to switch to all generic medications so she can afford it.She has polyphagia, but denies polydipsia or polyuria. Urine micro done today. HbgA1C has gone up from 8.1% to 8.5%. Discussed adding Actos and she is willing, discussed possible side effects. She is eating healthy.   HTN: taking medication. No chest pain or palpitation. She has some episodes of vertigo ( seen by ENT in the past) , we changed Lisinopril to 40 mg daily and bp is at goal today  Hyperlipidemia: taking Atorvastatin and denies side effects of medications  Cervical and lumbar radiculitis: seeing Dr. CPhyllis Ginger had a Left L4-5 transforaminal epidural injection, she also had PT, and on 07/01/2017 she had a left C3-4 transforaminal epidural steroid injection under fluoroscopic guidance. Had left shoulder surgery, tingling on her hands has improved, and neck pain has improved with recent steroid injection. She also has post-traumatic left knee OA , she takes Celebrex and has a slow gait and difficulty going up steps because of OA.   Insomnia: she has difficulty falling and staying asleep, she continues to take Trazodone prn and helps with her symptoms, but she does not like taking it every night.   Morbid Obesity: she tried Contrave but made her vomit. We started her on Belvig in April 2017 and she lost weight, she was  down 22 lbs by June when she had shoulder surgery, but she stopped exercising and taking medication and had gained 12lbs post-surgery, but stopped Belviq because of cost, we tried  changing to Victoza, but stopped a few weeks ago because of cost. She still has lost 2 lbs since last visit.   Chronic Bronchitis: she has a chronic productivecough and SOB with activity, she states pollen seems to make it worse. She used to smoke very little, but was second smoker all her life ( husband, brother's and co-workers)   GERD: symptoms are under control with Omeprazole but discussed potential long term risk, she is now on Ranitidine 150 mg twice daily and off Omeprazole since last visit   RUQ : she has noticed RUQ pain described as dull, sharp or cramping, intermittent over the past couple of months, she initially thought is was the Victoza but continues to have pain since she stopped medications a few weeks ago. No nausea or vomiting, no change in appetite. Pain is not triggered by movements, but seems to be triggered by meals at times. No fever or chills.    Patient Active Problem List   Diagnosis Date Noted  . Hypercalcemia 11/23/2015  . DDD (degenerative disc disease), lumbar 07/28/2015  . Spinal stenosis at L4-L5 level 06/12/2015  . Benign essential HTN 05/25/2015  . Bunion 05/25/2015  . Cardiac enlargement 05/25/2015  . Cervical radicular pain 05/25/2015  . Back pain, chronic 05/25/2015  .  Osteoarthritis 05/25/2015  . Dyslipidemia 05/25/2015  . Gastric reflux 05/25/2015  . Insomnia 05/25/2015  . Eczema intertrigo 05/25/2015  . Bronchitis, chronic, mucopurulent ([place]) 05/25/2015  . Numerous moles 05/25/2015  . Morbid obesity ([place]) 05/25/2015  . Perennial allergic rhinitis 05/25/2015  . DM (diabetes mellitus), type 2, uncontrolled, with renal complications ([place]) 46/80/3212  . Snores 05/25/2015  . History of artificial joint 05/25/2015  . Degeneration of intervertebral disc of cervical region  02/06/2015  . Neuritis or radiculitis due to rupture of lumbar intervertebral disc 01/14/2015  . Abnormal presence of protein in urine 05/04/2010  . Vitamin D deficiency 05/04/2010    Past Surgical History:  Procedure Laterality Date  . ABDOMINAL HYSTERECTOMY     due to cancer-partial  . BREAST BIOPSY Right    Benign  . COLONOSCOPY N/A 04/11/2015   Procedure: COLONOSCOPY;  Surgeon: Hulen Luster, MD;  Location: [facility] ENDOSCOPY;  Service: Gastroenterology;  Laterality: N/A;  . JOINT REPLACEMENT     left knee 2013 right knee 2005  . SHOULDER ARTHROSCOPY WITH OPEN ROTATOR CUFF REPAIR Left 05/18/2016   Procedure: SHOULDER ARTHROSCOPY WITH OPEN ROTATOR CUFF REPAIR,distal clavicle excision, decompression;  Surgeon: Corky Mull, MD;  Location: [facility] ORS;  Service: Orthopedics;  Laterality: Left;  . SPINAL FUSION     C-Spine  . Transforaminal Epidural  02/20/2015   Injection into cervical spine- C5-6 Dr. Phyllis Ginger    Family History  Problem Relation Age of Onset  . Diabetes Mother   . Kidney disease Mother   . Hypertension Mother   . Asthma Daughter   . Diabetes Sister   . Diabetes Brother     Social History   Social History  . Marital status: Widowed    Spouse name: N/A  . Number of children: N/A  . Years of education: N/A   Occupational History  . Not on file.   Social History Main Topics  . Smoking status: Never Smoker  . Smokeless tobacco: Never Used  . Alcohol use 0.0 oz/week     Comment: occasionally  . Drug use: No  . Sexual activity: Not Currently   Other Topics Concern  . Not on file   Social History Narrative  . No narrative on file     Current Outpatient Prescriptions:  .  acetaminophen (TYLENOL) 500 MG tablet, Take 1,000 mg by mouth every 6 (six) hours as needed (pain)., Disp: , Rfl:  .  aspirin EC 81 MG tablet, Take 1 tablet (81 mg total) by mouth daily., Disp: 90 tablet, Rfl: 1 .  atorvastatin (LIPITOR) 80 MG tablet, Take 1 tablet (80 mg total) by mouth  daily., Disp: 90 tablet, Rfl: 1 .  Blood Glucose Monitoring Suppl (ACCU-CHEK NANO SMARTVIEW) W/DEVICE KIT, 1 Device by Does not apply route 3 (three) times daily., Disp: 1 kit, Rfl: 0 .  celecoxib (CELEBREX) 200 MG capsule, TAKE ONE CAPSULE BY MOUTH ONCE DAILY, Disp: 90 capsule, Rfl: 1 .  cetirizine (ZYRTEC) 10 MG tablet, Take 1 tablet by mouth daily., Disp: , Rfl:  .  Cholecalciferol (VITAMIN D-3 PO), Take 1,000 Int'l Units by mouth daily., Disp: , Rfl:  .  ferrous sulfate 325 (65 FE) MG EC tablet, Take 1 tablet by mouth daily., Disp: , Rfl:  .  fluticasone (FLONASE) 50 MCG/ACT nasal spray, Place 2 sprays into both nostrils daily., Disp: 16 g, Rfl: 6 .  Fluticasone-Salmeterol (ADVAIR) 100-50 MCG/DOSE AEPB, Inhale 1 puff into the lungs 2 (two) times daily., Disp: 60 each, Rfl:  5 .  glipiZIDE (GLUCOTROL XL) 10 MG 24 hr tablet, Take 1 tablet (10 mg total) by mouth daily with breakfast., Disp: 90 tablet, Rfl: 0 .  glucose blood (ADVOCATE REDI-CODE) test strip, 1 each by Other route QID., Disp: 100 each, Rfl: 12 .  hydrochlorothiazide (HYDRODIURIL) 25 MG tablet, Take 1 tablet (25 mg total) by mouth daily., Disp: 90 tablet, Rfl: 1 .  Insulin Pen Needle (NOVOFINE) 30G X 8 MM MISC, Inject 10 each into the skin as needed., Disp: 100 each, Rfl: 0 .  lisinopril (PRINIVIL,ZESTRIL) 40 MG tablet, Take 1 tablet (40 mg total) by mouth daily. Change in therapy - once a day. At night, Disp: 90 tablet, Rfl: 1 .  metFORMIN (GLUCOPHAGE-XR) 750 MG 24 hr tablet, Take 2 tablets (1,500 mg total) by mouth daily with breakfast., Disp: 180 tablet, Rfl: 1 .  Multiple Vitamins-Minerals (MULTIVITAMIN PO), Take 1 tablet by mouth daily., Disp: , Rfl:  .  Omega-3 Fatty Acids (FISH OIL BURP-LESS) 500 MG CAPS, Take 1 capsule by mouth daily. , Disp: , Rfl:  .  pioglitazone (ACTOS) 15 MG tablet, Take 1 tablet (15 mg total) by mouth daily., Disp: 90 tablet, Rfl: 1 .  ranitidine (ZANTAC) 150 MG tablet, TAKE ONE TABLET BY MOUTH TWICE  DAILY *REPLACES  OMEPRAZOLE*, Disp: 180 tablet, Rfl: 1 .  tiZANidine (ZANAFLEX) 2 MG tablet, Take 2 mg by mouth daily as needed for muscle spasms. , Disp: , Rfl:  .  traMADol (ULTRAM) 50 MG tablet, Take 1 tablet (50 mg total) by mouth every 6 (six) hours as needed. for pain, Disp: 90 tablet, Rfl: 0 .  traZODone (DESYREL) 50 MG tablet, Take 0.5-1 tablets (25-50 mg total) by mouth at bedtime as needed for sleep., Disp: 90 tablet, Rfl: 1  No Known Allergies   ROS  Constitutional: Negative for fever or weight change.  Respiratory: Positive for chronic productive cough and shortness of breath with activity .   Cardiovascular: Negative for chest pain or palpitations.  Gastrointestinal: Positive  for right upper abdominal pain, no bowel changes.  Musculoskeletal: Negative for gait problem or joint swelling.  Skin: Negative for rash.  Neurological: Negative for dizziness or headache.  No other specific complaints in a complete review of systems (except as listed in HPI above).  Objective  Vitals:   07/15/17 0819  BP: 125/73  Pulse: 94  Resp: 17  Temp: 97.6 F (36.4 C)  TempSrc: Oral  SpO2: 96%  Weight: 223 lb 1.6 oz (101.2 kg)  Height: _0  (1.626 m)    Body mass index is 38.3 kg/m.  Physical Exam  Constitutional: Patient appears well-developed and well-nourished. Obese  No distress.  HEENT: head atraumatic, normocephalic, pupils equal and reactive to light, neck shows decrease rom on both directions - worse when rotating to the left side, throat within normal limits Cardiovascular: Normal rate, regular rhythm and normal heart sounds.  No murmur heard. No BLE edema. Pulmonary/Chest: Effort normal and breath sounds normal. No respiratory distress. Abdominal: Soft.  There is right upper quadrant tenderness. Normal bowel sounds, no guarding or rebound Psychiatric: Patient has a normal mood and affect. behavior is normal. Judgment and thought content normal. Skin: birth mark on the  right foot , also has some moles on soles of both feet but states biopsied by Dr. Elvina Mattes in the past and it was negative, she does not want to go back   Recent Results (from the past 2160 hour(s))  POCT Glucose (CBG)  Status: Abnormal   Collection Time: 07/15/17  8:21 AM  Result Value Ref Range   POC Glucose 250 (A) 70 - 99 mg/dl  POCT HgB A1C     Status: Abnormal   Collection Time: 07/15/17  8:24 AM  Result Value Ref Range   Hemoglobin A1C 8.5   Lipid panel     Status: None   Collection Time: 07/15/17  8:58 AM  Result Value Ref Range   Cholesterol 118 <200 mg/dL   Triglycerides 127 <150 mg/dL   HDL 56 >50 mg/dL   Total CHOL/HDL Ratio 2.1 <5.0 Ratio   VLDL 25 <30 mg/dL   LDL Cholesterol 37 <100 mg/dL  COMPLETE METABOLIC PANEL WITH GFR     Status: Abnormal   Collection Time: 07/15/17  8:58 AM  Result Value Ref Range   Sodium 139 135 - 146 mmol/L   Potassium 4.8 3.5 - 5.3 mmol/L   Chloride 103 98 - 110 mmol/L   CO2 22 20 - 32 mmol/L    Comment: ** Please note change in reference range(s). **      Glucose, Bld 259 (H) 65 - 99 mg/dL   BUN 23 7 - 25 mg/dL   Creat 0.88 0.60 - 0.93 mg/dL    Comment:   For patients > or = 71 years of age: The upper reference limit for Creatinine is approximately 13% higher for people identified as African-American.      Total Bilirubin 0.4 0.2 - 1.2 mg/dL   Alkaline Phosphatase 90 33 - 130 U/L   AST 20 10 - 35 U/L   ALT 26 6 - 29 U/L   Total Protein 6.8 6.1 - 8.1 g/dL   Albumin 4.4 3.6 - 5.1 g/dL   Calcium 9.9 8.6 - 10.4 mg/dL   GFR, Est African American 76 >=60 mL/min   GFR, Est Non African American 66 >=60 mL/min  CBC with Differential/Platelet     Status: Abnormal   Collection Time: 07/15/17  8:58 AM  Result Value Ref Range   WBC 7.8 3.8 - 10.8 K/uL   RBC 4.54 3.80 - 5.10 MIL/uL   Hemoglobin 13.1 11.7 - 15.5 g/dL   HCT 40.2 35.0 - 45.0 %   MCV 88.5 80.0 - 100.0 fL   MCH 28.9 27.0 - 33.0 pg   MCHC 32.6 32.0 - 36.0 g/dL   RDW  15.3 (H) 11.0 - 15.0 %   Platelets 300 140 - 400 K/uL   MPV 9.8 7.5 - 12.5 fL   Neutro Abs 5,304 1,500 - 7,800 cells/uL   Lymphs Abs 1,872 850 - 3,900 cells/uL   Monocytes Absolute 390 200 - 950 cells/uL   Eosinophils Absolute 156 15 - 500 cells/uL   Basophils Absolute 78 0 - 200 cells/uL   Neutrophils Relative % 68 %   Lymphocytes Relative 24 %   Monocytes Relative 5 %   Eosinophils Relative 2 %   Basophils Relative 1 %   Smear Review Criteria for review not met   VITAMIN D 25 Hydroxy (Vit-D Deficiency, Fractures)     Status: None   Collection Time: 07/15/17  8:58 AM  Result Value Ref Range   Vit D, 25-Hydroxy 42 30 - 100 ng/mL    Comment: Vitamin D Status           25-OH Vitamin D        Deficiency                <20 ng/mL  Insufficiency         20 - 29 ng/mL        Optimal             > or = 30 ng/mL   For 25-OH Vitamin D testing on patients on D2-supplementation and patients for whom quantitation of D2 and D3 fractions is required, the QuestAssureD 25-OH VIT D, (D2,D3), LC/MS/MS is recommended: order code [phone] (patients > 2 yrs).   Vitamin B12     Status: Abnormal   Collection Time: 07/15/17  8:58 AM  Result Value Ref Range   Vitamin B-12 1,951 (H) 200 - 1,100 pg/mL    Diabetic Foot Exam: Diabetic Foot Exam - Simple   Simple Foot Form Diabetic Foot exam was performed with the following findings:  Yes 07/15/2017  8:31 AM  Visual Inspection See comments:  Yes Sensation Testing Intact to touch and monofilament testing bilaterally:  Yes Pulse Check Posterior Tibialis and Dorsalis pulse intact bilaterally:  Yes Comments      PHQ2/9: Depression screen [facility] 2/9 03/14/2017 11/11/2016 07/15/2016 03/15/2016 11/21/2015  Decreased Interest 0 0 0 0 0  Down, Depressed, Hopeless 0 0 0 0 0  PHQ - 2 Score 0 0 0 0 0     Fall Risk: Fall Risk  03/14/2017 11/11/2016 07/15/2016 03/15/2016 11/21/2015  Falls in the past year? No Yes No Yes No  Number falls in past yr: - 1 - 1 -   Injury with Fall? - Yes - Yes -  Comment - - - hurt her left knee -  Follow up - Falls evaluation completed - - -     Assessment & Plan  1. Uncontrolled type 2 diabetes mellitus with chronic kidney disease, without long-term current use of insulin, unspecified CKD stage (HCC)  - POCT HgB A1C - POCT UA - Microalbumin - POCT Glucose (CBG) - pioglitazone (ACTOS) 15 MG tablet; Take 1 tablet (15 mg total) by mouth daily.  Dispense: 90 tablet; Refill: 1 - metFORMIN (GLUCOPHAGE-XR) 750 MG 24 hr tablet; Take 2 tablets (1,500 mg total) by mouth daily with breakfast.  Dispense: 180 tablet; Refill: 1 - aspirin EC 81 MG tablet; Take 1 tablet (81 mg total) by mouth daily.  Dispense: 90 tablet; Refill: 1 - glipiZIDE (GLUCOTROL XL) 10 MG 24 hr tablet; Take 1 tablet (10 mg total) by mouth daily with breakfast.  Dispense: 90 tablet; Refill: 0  2. Cervical radicular pain  - traMADol (ULTRAM) 50 MG tablet; Take 1 tablet (50 mg total) by mouth every 6 (six) hours as needed. for pain  Dispense: 90 tablet; Refill: 0  3. Mucopurulent chronic bronchitis (HCC)  - Fluticasone-Salmeterol (ADVAIR) 100-50 MCG/DOSE AEPB; Inhale 1 puff into the lungs 2 (two) times daily.  Dispense: 60 each; Refill: 5  4. Other insomnia  Continue prn Trazodone  5. Benign essential HTN  At goal, continue medication   6. Dyslipidemia  - Lipid panel  7. Morbid obesity due to excess calories ([place])  Continue life style modification   8. Gastric reflux  Advised to take one pill daily and prn   9. RUQ abdominal pain  - US Abdomen Limited RUQ; Future - COMPLETE METABOLIC PANEL WITH GFR - CBC with Differential/Platelet  10. Long-term use of high-risk medication  - Vitamin B12  11. Vitamin D deficiency  - VITAMIN D 25 Hydroxy (Vit-D Deficiency, Fractures)

## 2017-07-16 LAB — COMPLETE METABOLIC PANEL WITH GFR
ALT: 26 U/L (ref 6–29)
AST: 20 U/L (ref 10–35)
Albumin: 4.4 g/dL (ref 3.6–5.1)
Alkaline Phosphatase: 90 U/L (ref 33–130)
BUN: 23 mg/dL (ref 7–25)
CO2: 22 mmol/L (ref 20–32)
Calcium: 9.9 mg/dL (ref 8.6–10.4)
Chloride: 103 mmol/L (ref 98–110)
Creat: 0.88 mg/dL (ref 0.60–0.93)
GFR, Est African American: 76 mL/min (ref >=60)
GFR, Est Non African American: 66 mL/min (ref >=60)
Glucose, Bld: 259 mg/dL — ABNORMAL HIGH (ref 65–99)
Potassium: 4.8 mmol/L (ref 3.5–5.3)
Sodium: 139 mmol/L (ref 135–146)
Total Bilirubin: 0.4 mg/dL (ref 0.2–1.2)
Total Protein: 6.8 g/dL (ref 6.1–8.1)

## 2017-07-16 LAB — VITAMIN B12: Vitamin B-12: 1951 pg/mL — ABNORMAL HIGH (ref 200–1100)

## 2017-07-16 LAB — LIPID PANEL
Cholesterol: 118 mg/dL (ref <200)
HDL: 56 mg/dL (ref >50)
LDL Cholesterol: 37 mg/dL (ref <100)
Total CHOL/HDL Ratio: 2.1 Ratio (ref <5.0)
Triglycerides: 127 mg/dL (ref <150)
VLDL: 25 mg/dL (ref <30)

## 2017-07-16 LAB — VITAMIN D 25 HYDROXY (VIT D DEFICIENCY, FRACTURES): Vit D, 25-Hydroxy: 42 ng/mL (ref 30–100)

## 2017-07-18 ENCOUNTER — Other Ambulatory Visit: Payer: Self-pay | Admitting: Family Medicine

## 2017-07-18 DIAGNOSIS — Z1231 Encounter for screening mammogram for malignant neoplasm of breast: Secondary | ICD-10-CM

## 2017-07-22 ENCOUNTER — Other Ambulatory Visit: Payer: Self-pay | Admitting: Family Medicine

## 2017-07-22 DIAGNOSIS — E785 Hyperlipidemia, unspecified: Secondary | ICD-10-CM

## 2017-07-22 LAB — POCT UA - MICROALBUMIN: Microalbumin Ur, POC: 50 mg/L

## 2017-07-22 NOTE — Telephone Encounter (Signed)
Patient requesting refill of Atorvastatin to [facility].

## 2017-07-25 ENCOUNTER — Telehealth: Payer: Self-pay | Admitting: Family Medicine

## 2017-07-25 NOTE — Telephone Encounter (Signed)
Pt would like lab results.  

## 2017-07-25 NOTE — Telephone Encounter (Signed)
Patient notified of lab results by phone.  

## 2017-07-26 ENCOUNTER — Ambulatory Visit
Admission: RE | Admit: 2017-07-26 | Discharge: 2017-07-26 | Disposition: A | Discharge status: Home / Self Care | Payer: PPO | Source: Ambulatory Visit | Attending: Family Medicine | Admitting: Family Medicine

## 2017-07-26 DIAGNOSIS — K760 Fatty (change of) liver, not elsewhere classified: Secondary | ICD-10-CM | POA: Insufficient documentation

## 2017-07-26 DIAGNOSIS — R1011 Right upper quadrant pain: Secondary | ICD-10-CM | POA: Diagnosis not present

## 2017-08-05 ENCOUNTER — Other Ambulatory Visit: Payer: Self-pay | Admitting: Family Medicine

## 2017-08-05 DIAGNOSIS — I10 Essential (primary) hypertension: Secondary | ICD-10-CM

## 2017-08-05 DIAGNOSIS — E1165 Type 2 diabetes mellitus with hyperglycemia: Secondary | ICD-10-CM

## 2017-08-05 DIAGNOSIS — E1122 Type 2 diabetes mellitus with diabetic chronic kidney disease: Secondary | ICD-10-CM

## 2017-08-29 ENCOUNTER — Ambulatory Visit
Admission: RE | Admit: 2017-08-29 | Discharge: 2017-08-29 | Disposition: A | Discharge status: Home / Self Care | Payer: PPO | Source: Ambulatory Visit | Attending: Family Medicine | Admitting: Family Medicine

## 2017-08-29 ENCOUNTER — Encounter: Payer: Self-pay | Admitting: Family Medicine

## 2017-08-29 DIAGNOSIS — Z1231 Encounter for screening mammogram for malignant neoplasm of breast: Secondary | ICD-10-CM | POA: Insufficient documentation

## 2017-08-29 LAB — HM MAMMOGRAPHY: HM Mammogram: NORMAL (ref 0–4)

## 2017-09-27 DIAGNOSIS — M5136 Other intervertebral disc degeneration, lumbar region: Secondary | ICD-10-CM | POA: Diagnosis not present

## 2017-09-27 DIAGNOSIS — M5416 Radiculopathy, lumbar region: Secondary | ICD-10-CM | POA: Diagnosis not present

## 2017-09-27 DIAGNOSIS — M5412 Radiculopathy, cervical region: Secondary | ICD-10-CM | POA: Diagnosis not present

## 2017-09-27 DIAGNOSIS — M48062 Spinal stenosis, lumbar region with neurogenic claudication: Secondary | ICD-10-CM | POA: Diagnosis not present

## 2017-09-27 DIAGNOSIS — M5030 Other cervical disc degeneration, unspecified cervical region: Secondary | ICD-10-CM | POA: Diagnosis not present

## 2017-09-29 ENCOUNTER — Other Ambulatory Visit: Payer: Self-pay | Admitting: Family Medicine

## 2017-09-29 DIAGNOSIS — M5412 Radiculopathy, cervical region: Secondary | ICD-10-CM

## 2017-10-03 ENCOUNTER — Other Ambulatory Visit: Payer: Self-pay | Admitting: Family Medicine

## 2017-10-03 DIAGNOSIS — M5412 Radiculopathy, cervical region: Secondary | ICD-10-CM

## 2017-10-24 DIAGNOSIS — M5136 Other intervertebral disc degeneration, lumbar region: Secondary | ICD-10-CM | POA: Diagnosis not present

## 2017-10-24 DIAGNOSIS — M48062 Spinal stenosis, lumbar region with neurogenic claudication: Secondary | ICD-10-CM | POA: Diagnosis not present

## 2017-10-24 DIAGNOSIS — M5416 Radiculopathy, lumbar region: Secondary | ICD-10-CM | POA: Diagnosis not present

## 2017-11-04 ENCOUNTER — Other Ambulatory Visit: Payer: Self-pay | Admitting: Family Medicine

## 2017-11-04 DIAGNOSIS — M153 Secondary multiple arthritis: Secondary | ICD-10-CM

## 2017-11-10 ENCOUNTER — Other Ambulatory Visit: Payer: Self-pay | Admitting: Family Medicine

## 2017-11-10 DIAGNOSIS — K219 Gastro-esophageal reflux disease without esophagitis: Secondary | ICD-10-CM

## 2017-11-14 ENCOUNTER — Encounter: Payer: Self-pay | Admitting: Family Medicine

## 2017-11-14 ENCOUNTER — Ambulatory Visit: Payer: PPO | Admitting: Family Medicine

## 2017-11-14 VITALS — BP 140/80 | HR 95 | Temp 98.8°F | Resp 14 | Ht 64.0 in | Wt 234.3 lb

## 2017-11-14 DIAGNOSIS — IMO0002 Reserved for concepts with insufficient information to code with codable children: Secondary | ICD-10-CM

## 2017-11-14 DIAGNOSIS — E785 Hyperlipidemia, unspecified: Secondary | ICD-10-CM | POA: Diagnosis not present

## 2017-11-14 DIAGNOSIS — E1165 Type 2 diabetes mellitus with hyperglycemia: Secondary | ICD-10-CM

## 2017-11-14 DIAGNOSIS — J209 Acute bronchitis, unspecified: Secondary | ICD-10-CM | POA: Diagnosis not present

## 2017-11-14 DIAGNOSIS — N182 Chronic kidney disease, stage 2 (mild): Secondary | ICD-10-CM

## 2017-11-14 DIAGNOSIS — Z23 Encounter for immunization: Secondary | ICD-10-CM | POA: Diagnosis not present

## 2017-11-14 DIAGNOSIS — I10 Essential (primary) hypertension: Secondary | ICD-10-CM | POA: Diagnosis not present

## 2017-11-14 DIAGNOSIS — E1122 Type 2 diabetes mellitus with diabetic chronic kidney disease: Secondary | ICD-10-CM | POA: Diagnosis not present

## 2017-11-14 DIAGNOSIS — J42 Unspecified chronic bronchitis: Secondary | ICD-10-CM | POA: Diagnosis not present

## 2017-11-14 LAB — POCT GLYCOSYLATED HEMOGLOBIN (HGB A1C): Hemoglobin A1C: 9

## 2017-11-14 MED ORDER — SEMAGLUTIDE(0.25 OR 0.5MG/DOS) 2 MG/1.5ML ~~LOC~~ SOPN: 0.5000 mg | PEN_INJECTOR | SUBCUTANEOUS | 2 refills | Status: DC

## 2017-11-14 MED ORDER — AMOXICILLIN-POT CLAVULANATE 875-125 MG PO TABS: 1.0000 | ORAL_TABLET | Freq: Two times a day (BID) | ORAL | 0 refills | Status: DC

## 2017-11-14 MED ORDER — BENZONATATE 100 MG PO CAPS
100.0000 mg | ORAL_CAPSULE | Freq: Two times a day (BID) | ORAL | 0 refills | Status: DC | PRN
Start: 2017-11-14 — End: 2017-12-05

## 2017-11-14 MED ORDER — ALBUTEROL SULFATE (2.5 MG/3ML) 0.083% IN NEBU
2.5000 mg | INHALATION_SOLUTION | Freq: Once | RESPIRATORY_TRACT | Status: AC
  Administered 2017-11-14: 2.5 mg via RESPIRATORY_TRACT

## 2017-11-14 NOTE — Progress Notes (Signed)
Name: Cindy Griffin   MRN: [id number]    DOB: [birth year]   Date:11/14/2017       Progress Note  Subjective  Chief Complaint  Chief Complaint  Patient presents with  . Hypertension  . Diabetes    HPI   DMII with nephropathy: she is cannot afford brand medications. We tried Synjardi and Victoza but had to stop because of cost. She is currently on Glipizide XL'10mg'$   and Metformin 1500 mg daily, Actos 15 mg, and glucose is trending up. She is not following diabetic diet and has not been walking in the past couple of months. Her hgbA1C has not been at goal since 07/2015. Explained that we need to change her regiment or refer her to Endo. She is willing to try Ozempic, but refuses insulin injections. She states she has been watching her great-grandaughter and is affected her schedule. She has polyphagia, but denies polydipsia or polyuria. Urine micro normal. HbgA1C has gone up from 8.1% to 8.5%, today 9.0%    HTN: taking medication. No chest pain or palpitation, continue medication, stressed today - found out her grandaughter was shot and is upset.   Hyperlipidemia: taking Atorvastatin and denies side effects of medications, reviewed labs and it is at goal.   Cervical and lumbar radiculitis/OA: seeing Dr. Phyllis Ginger, had a Left L4-5 transforaminal epidural injection, she also had PT, and on 07/01/2017 she had a left C3-4 transforaminal epidural steroid injection under fluoroscopic guidance. Had left shoulder surgery, tingling on her hands has improved, and neck pain has improved with recent steroid injection. She also has post-traumatic left knee OA , she takes Celebrex   Insomnia: she has difficulty falling and staying asleep, she continues to take Trazodone prn and helps with her symptoms, but she does not like taking it every night.   Morbid Obesity: she tried Contrave but made her vomit. We started her on Belvig in April 2017 and she lost weight, she was down 22 lbs by June when she had  shoulder surgery, but she stopped exercising and taking medication and hadgained 12lbs post-surgery, but stopped Belviq because of cost, she has not been following a diabetic diet, has been sedentary and has gained 11lbs in the past 4 months  Chronic Bronchitis: she has a chronic productivecough and SOB with activity, she developed URI symptoms about 2 weeks ago, with rhinorrhea, nasal congestion, post-nasal drainage and a few days later went down her chest, she is tried of coughing, productive, associated with some chest tightness/congestion and SOB with activity. No fever or chills at this time.  GERD: symptoms are under control with Omeprazole but discussed potential long term risk, she is now on Ranitidine 150 mg twice daily and off Omeprazole since Summer 2018  Patient Active Problem List   Diagnosis Date Noted  . Hypercalcemia 11/23/2015  . DDD (degenerative disc disease), lumbar 07/28/2015  . Spinal stenosis at L4-L5 level 06/12/2015  . Benign essential HTN 05/25/2015  . Bunion 05/25/2015  . Cardiac enlargement 05/25/2015  . Cervical radicular pain 05/25/2015  . Back pain, chronic 05/25/2015  . Osteoarthritis 05/25/2015  . Dyslipidemia 05/25/2015  . Gastric reflux 05/25/2015  . Insomnia 05/25/2015  . Eczema intertrigo 05/25/2015  . Bronchitis, chronic, mucopurulent ([place]) 05/25/2015  . Numerous moles 05/25/2015  . Morbid obesity ([place]) 05/25/2015  . Perennial allergic rhinitis 05/25/2015  . DM (diabetes mellitus), type 2, uncontrolled, with renal complications ([place]) 32/67/1245  . Snores 05/25/2015  . History of artificial joint 05/25/2015  . Degeneration of  intervertebral disc of cervical region 02/06/2015  . Neuritis or radiculitis due to rupture of lumbar intervertebral disc 01/14/2015  . Abnormal presence of protein in urine 05/04/2010  . Vitamin D deficiency 05/04/2010    Past Surgical History:  Procedure Laterality Date  . ABDOMINAL HYSTERECTOMY     due to  cancer-partial  . BREAST BIOPSY Right    Benign  . COLONOSCOPY N/A 04/11/2015   Procedure: COLONOSCOPY;  Surgeon: Hulen Luster, MD;  Location: [facility] ENDOSCOPY;  Service: Gastroenterology;  Laterality: N/A;  . JOINT REPLACEMENT     left knee 2013 right knee 2005  . SHOULDER ARTHROSCOPY WITH OPEN ROTATOR CUFF REPAIR Left 05/18/2016   Procedure: SHOULDER ARTHROSCOPY WITH OPEN ROTATOR CUFF REPAIR,distal clavicle excision, decompression;  Surgeon: Corky Mull, MD;  Location: [facility] ORS;  Service: Orthopedics;  Laterality: Left;  . SPINAL FUSION     C-Spine  . Transforaminal Epidural  02/20/2015   Injection into cervical spine- C5-6 Dr. Phyllis Ginger    Family History  Problem Relation Age of Onset  . Diabetes Mother   . Kidney disease Mother   . Hypertension Mother   . Asthma Daughter   . Diabetes Sister   . Diabetes Brother     Social History   Socioeconomic History  . Marital status: Widowed    Spouse name: Not on file  . Number of children: Not on file  . Years of education: Not on file  . Highest education level: Not on file  Social Needs  . Financial resource strain: Not on file  . Food insecurity - worry: Not on file  . Food insecurity - inability: Not on file  . Transportation needs - medical: Not on file  . Transportation needs - non-medical: Not on file  Occupational History  . Not on file  Tobacco Use  . Smoking status: Never Smoker  . Smokeless tobacco: Never Used  Substance and Sexual Activity  . Alcohol use: Yes    Alcohol/week: 0.0 oz    Comment: occasionally  . Drug use: No  . Sexual activity: Not Currently  Other Topics Concern  . Not on file  Social History Narrative  . Not on file     Current Outpatient Medications:  .  acetaminophen (TYLENOL) 500 MG tablet, Take 1,000 mg by mouth every 6 (six) hours as needed (pain)., Disp: , Rfl:  .  aspirin EC 81 MG tablet, Take 1 tablet (81 mg total) by mouth daily., Disp: 90 tablet, Rfl: 1 .  atorvastatin (LIPITOR) 80  MG tablet, TAKE ONE TABLET BY MOUTH ONCE DAILY, Disp: 90 tablet, Rfl: 1 .  Blood Glucose Monitoring Suppl (ACCU-CHEK NANO SMARTVIEW) W/DEVICE KIT, 1 Device by Does not apply route 3 (three) times daily., Disp: 1 kit, Rfl: 0 .  celecoxib (CELEBREX) 200 MG capsule, TAKE 1 CAPSULE BY MOUTH ONCE DAILY, Disp: 90 capsule, Rfl: 1 .  cetirizine (ZYRTEC) 10 MG tablet, Take 1 tablet by mouth daily., Disp: , Rfl:  .  Cholecalciferol (VITAMIN D-3 PO), Take 1,000 Int'l Units by mouth daily., Disp: , Rfl:  .  ferrous sulfate 325 (65 FE) MG EC tablet, Take 1 tablet by mouth daily., Disp: , Rfl:  .  fluticasone (FLONASE) 50 MCG/ACT nasal spray, Place 2 sprays into both nostrils daily., Disp: 16 g, Rfl: 6 .  Fluticasone-Salmeterol (ADVAIR) 100-50 MCG/DOSE AEPB, Inhale 1 puff into the lungs 2 (two) times daily., Disp: 60 each, Rfl: 5 .  glipiZIDE (GLUCOTROL XL) 10 MG 24 hr tablet,  Take 1 tablet (10 mg total) by mouth daily with breakfast., Disp: 90 tablet, Rfl: 0 .  glucose blood (ADVOCATE REDI-CODE) test strip, 1 each by Other route QID., Disp: 100 each, Rfl: 12 .  hydrochlorothiazide (HYDRODIURIL) 25 MG tablet, Take 1 tablet (25 mg total) by mouth daily., Disp: 90 tablet, Rfl: 1 .  lisinopril (PRINIVIL,ZESTRIL) 40 MG tablet, TAKE ONE TABLET BY MOUTH ONCE DAILY AT NIGHT., Disp: 90 tablet, Rfl: 1 .  metFORMIN (GLUCOPHAGE-XR) 750 MG 24 hr tablet, Take 2 tablets (1,500 mg total) by mouth daily with breakfast., Disp: 180 tablet, Rfl: 1 .  Multiple Vitamins-Minerals (MULTIVITAMIN PO), Take 1 tablet by mouth daily., Disp: , Rfl:  .  Omega-3 Fatty Acids (FISH OIL BURP-LESS) 500 MG CAPS, Take 1 capsule by mouth daily. , Disp: , Rfl:  .  pioglitazone (ACTOS) 15 MG tablet, Take 1 tablet (15 mg total) by mouth daily., Disp: 90 tablet, Rfl: 1 .  ranitidine (ZANTAC) 150 MG tablet, TAKE 1 TABLET BY MOUTH TWICE DAILY. REPLACES OMEPRAZOLE., Disp: 180 tablet, Rfl: 1 .  tiZANidine (ZANAFLEX) 2 MG tablet, Take 2 mg by mouth daily as  needed for muscle spasms. , Disp: , Rfl:  .  traMADol (ULTRAM) 50 MG tablet, Take 1 tablet (50 mg total) by mouth every 6 (six) hours as needed. for pain, Disp: 90 tablet, Rfl: 0 .  traZODone (DESYREL) 50 MG tablet, Take 0.5-1 tablets (25-50 mg total) by mouth at bedtime as needed for sleep., Disp: 90 tablet, Rfl: 1 .  amoxicillin-clavulanate (AUGMENTIN) 875-125 MG tablet, Take 1 tablet by mouth 2 (two) times daily., Disp: 20 tablet, Rfl: 0 .  benzonatate (TESSALON) 100 MG capsule, Take 1-2 capsules (100-200 mg total) by mouth 2 (two) times daily as needed., Disp: 40 capsule, Rfl: 0 .  Semaglutide (OZEMPIC) 0.25 or 0.5 MG/DOSE SOPN, Inject 0.5 mg into the skin once a week., Disp: 2 pen, Rfl: 2  No Known Allergies   ROS  Constitutional: Negative for fever , positive for weight change.  Respiratory: Positive  for cough and shortness of breath.   Cardiovascular: Negative for chest pain or palpitations.  Gastrointestinal: Negative for abdominal pain, no bowel changes.  Musculoskeletal: Negative for gait problem positive for  joint swelling.  Skin: Negative for rash.  Neurological: Negative for dizziness or headache.  No other specific complaints in a complete review of systems (except as listed in HPI above).  Objective  Vitals:   11/14/17 0748  BP: 140/80  Pulse: 95  Resp: 14  Temp: 98.8 F (37.1 C)  TempSrc: Oral  SpO2: 98%  Weight: 234 lb 4.8 oz (106.3 kg)  Height: _0  (1.626 m)    Body mass index is 40.22 kg/m.  Physical Exam  Constitutional: Patient appears well-developed and well-nourished. Obese  No distress.  HEENT: head atraumatic, normocephalic, pupils equal and reactive to light,  neck supple, throat within normal limits Cardiovascular: Normal rate, regular rhythm and normal heart sounds.  No murmur heard. Trace  BLE edema. Pulmonary/Chest: Effort normal , she has scattered rhonchi and end expiratory wheezing No respiratory distress. Abdominal: Soft.  There is no  tenderness. Psychiatric: Patient has a normal mood and affect. behavior is normal. Judgment and thought content normal.   Recent Results (from the past 2160 hour(s))  HM MAMMOGRAPHY     Status: None   Collection Time: 08/29/17 12:00 AM  Result Value Ref Range   HM Mammogram Self Reported Normal 0-4 Bi-Rad, Self Reported Normal  Comment: In Chart from [place], Negative, Return in 1 year  POCT HgB A1C     Status: Abnormal   Collection Time: 11/14/17  8:00 AM  Result Value Ref Range   Hemoglobin A1C 9.0       PHQ2/9: Depression screen [facility] [place] 2/9 03/14/2017 11/11/2016 07/15/2016 03/15/2016 11/21/2015  Decreased Interest 0 0 0 0 0  Down, Depressed, Hopeless 0 0 0 0 0  PHQ - 2 Score 0 0 0 0 0     Fall Risk: Fall Risk  11/14/2017 03/14/2017 11/11/2016 07/15/2016 03/15/2016  Falls in the past year? No No Yes No Yes  Number falls in past yr: - - 1 - 1  Injury with Fall? - - Yes - Yes  Comment - - - - hurt her left knee  Follow up - - Falls evaluation completed - -     Functional Status Survey: Is the patient deaf or have difficulty hearing?: No Does the patient have difficulty seeing, even when wearing glasses/contacts?: No Does the patient have difficulty concentrating, remembering, or making decisions?: No Does the patient have difficulty walking or climbing stairs?: No Does the patient have difficulty dressing or bathing?: No Does the patient have difficulty doing errands alone such as visiting a doctor's office or shopping?: No   Assessment & Plan  1. Uncontrolled type 2 diabetes mellitus with stage 2 chronic kidney disease, without long-term current use of insulin (HCC)  - POCT HgB A1C - Semaglutide (OZEMPIC) 0.25 or 0.5 MG/DOSE SOPN; Inject 0.5 mg into the skin once a week.  Dispense: 2 pen; Refill: 2 Denies family history of thyroid cancer or personal history of pancreatitis  2. Benign essential HTN  Slightly elevated, continue medication   3. Dyslipidemia  At goal,  continue medication   4. Morbid obesity due to excess calories ([place])  Discussed life style modification   5. Chronic bronchitis with acute exacerbation ([place])  Continue Advair Neb treatment in the office today, she does not want to have a rescue inhaler at home  - benzonatate (TESSALON) 100 MG capsule; Take 1-2 capsules (100-200 mg total) by mouth 2 (two) times daily as needed.  Dispense: 40 capsule; Refill: 0 - amoxicillin-clavulanate (AUGMENTIN) 875-125 MG tablet; Take 1 tablet by mouth 2 (two) times daily.  Dispense: 20 tablet; Refill: 0

## 2017-12-05 ENCOUNTER — Ambulatory Visit (INDEPENDENT_AMBULATORY_CARE_PROVIDER_SITE_OTHER): Payer: PPO | Admitting: Family Medicine

## 2017-12-05 ENCOUNTER — Encounter: Payer: Self-pay | Admitting: Family Medicine

## 2017-12-05 VITALS — BP 130/76 | HR 72 | Temp 98.0°F | Resp 16 | Ht 64.0 in | Wt 233.6 lb

## 2017-12-05 DIAGNOSIS — E1165 Type 2 diabetes mellitus with hyperglycemia: Secondary | ICD-10-CM | POA: Diagnosis not present

## 2017-12-05 DIAGNOSIS — F4329 Adjustment disorder with other symptoms: Secondary | ICD-10-CM

## 2017-12-05 DIAGNOSIS — R05 Cough: Secondary | ICD-10-CM

## 2017-12-05 DIAGNOSIS — R059 Cough, unspecified: Secondary | ICD-10-CM

## 2017-12-05 DIAGNOSIS — E1129 Type 2 diabetes mellitus with other diabetic kidney complication: Secondary | ICD-10-CM | POA: Diagnosis not present

## 2017-12-05 DIAGNOSIS — J411 Mucopurulent chronic bronchitis: Secondary | ICD-10-CM

## 2017-12-05 DIAGNOSIS — H109 Unspecified conjunctivitis: Secondary | ICD-10-CM | POA: Diagnosis not present

## 2017-12-05 DIAGNOSIS — F4321 Adjustment disorder with depressed mood: Secondary | ICD-10-CM

## 2017-12-05 DIAGNOSIS — IMO0002 Reserved for concepts with insufficient information to code with codable children: Secondary | ICD-10-CM

## 2017-12-05 DIAGNOSIS — Z634 Disappearance and death of family member: Secondary | ICD-10-CM | POA: Diagnosis not present

## 2017-12-05 MED ORDER — ERYTHROMYCIN 5 MG/GM OP OINT: 1.0000 "application " | TOPICAL_OINTMENT | Freq: Four times a day (QID) | OPHTHALMIC | 0 refills | Status: AC

## 2017-12-05 MED ORDER — ALBUTEROL SULFATE HFA 108 (90 BASE) MCG/ACT IN AERS: 2.0000 | INHALATION_SPRAY | Freq: Four times a day (QID) | RESPIRATORY_TRACT | 0 refills | Status: DC | PRN

## 2017-12-05 MED ORDER — BENZONATATE 100 MG PO CAPS
100.0000 mg | ORAL_CAPSULE | Freq: Two times a day (BID) | ORAL | 0 refills | Status: DC | PRN
Start: 2017-12-05 — End: 2017-12-27

## 2017-12-05 NOTE — Patient Instructions (Addendum)

## 2017-12-05 NOTE — Progress Notes (Signed)
Name: Cindy Griffin   MRN: [id number]    DOB: 13-Aug-1946   Date:12/05/2017       Progress Note  Subjective  Chief Complaint  Chief Complaint  Patient presents with  . Conjunctivitis    eye discharge, matted together this morning, painful    HPI  Patient presents with 2 days of RIGHT eye drainage with itching; yesterday and today she woke up with right eye mattering present - used warm compress to remove.  She notes eye is red - no symptoms in the LEFT eye. PT is diabetic BG's have been 89-90's; last A1C was 9.0 - she has been trying to eat more healthy since her last visit with elevated A1C.  Denies eye pain or vision changes aside from first thing in the morning; no recent trauma to the area.  Complicated Grief: Her Grandaugther, Mimi Annaliz Aven) was shot on 11/14/2017 and this has been a really difficult time for her and her family.  She is still coping, but says her [place] has been helping her through, is not in formal counseling and declines this today.  Cough/Chronic Bronchitis: Patient was seen and treated by PCP Dr. Ancil Boozer on 11/14/2017 with Augmentin for Acute Exacerbation of Chronic Bronchitis.  She still has lingering cough and needs refill of her Tessalon.  She is using daily Advair.  She denies fevers/chills, body aches, confusion, or chest pain.  Some shortness of breath which is ongoing with activity and occasional wheezing. She does not have albuterol inhaler for these episodes. We will provide today.  Patient Active Problem List   Diagnosis Date Noted  . Hypercalcemia 11/23/2015  . DDD (degenerative disc disease), lumbar 07/28/2015  . Spinal stenosis at L4-L5 level 06/12/2015  . Benign essential HTN 05/25/2015  . Bunion 05/25/2015  . Cardiac enlargement 05/25/2015  . Cervical radicular pain 05/25/2015  . Back pain, chronic 05/25/2015  . Osteoarthritis 05/25/2015  . Dyslipidemia 05/25/2015  . Gastric reflux 05/25/2015  . Insomnia 05/25/2015  .  Eczema intertrigo 05/25/2015  . Bronchitis, chronic, mucopurulent ([place]) 05/25/2015  . Numerous moles 05/25/2015  . Morbid obesity ([place]) 05/25/2015  . Perennial allergic rhinitis 05/25/2015  . DM (diabetes mellitus), type 2, uncontrolled, with renal complications ([place]) 40/34/7425  . Snores 05/25/2015  . History of artificial joint 05/25/2015  . Degeneration of intervertebral disc of cervical region 02/06/2015  . Neuritis or radiculitis due to rupture of lumbar intervertebral disc 01/14/2015  . Abnormal presence of protein in urine 05/04/2010  . Vitamin D deficiency 05/04/2010    Social History   Tobacco Use  . Smoking status: Never Smoker  . Smokeless tobacco: Never Used  Substance Use Topics  . Alcohol use: Yes    Alcohol/week: 0.0 oz    Comment: occasionally     Current Outpatient Medications:  .  acetaminophen (TYLENOL) 500 MG tablet, Take 1,000 mg by mouth every 6 (six) hours as needed (pain)., Disp: , Rfl:  .  amoxicillin-clavulanate (AUGMENTIN) 875-125 MG tablet, Take 1 tablet by mouth 2 (two) times daily., Disp: 20 tablet, Rfl: 0 .  aspirin EC 81 MG tablet, Take 1 tablet (81 mg total) by mouth daily., Disp: 90 tablet, Rfl: 1 .  atorvastatin (LIPITOR) 80 MG tablet, TAKE ONE TABLET BY MOUTH ONCE DAILY, Disp: 90 tablet, Rfl: 1 .  benzonatate (TESSALON) 100 MG capsule, Take 1-2 capsules (100-200 mg total) by mouth 2 (two) times daily as needed., Disp: 40 capsule, Rfl: 0 .  Blood Glucose Monitoring Suppl (ACCU-CHEK  NANO SMARTVIEW) W/DEVICE KIT, 1 Device by Does not apply route 3 (three) times daily., Disp: 1 kit, Rfl: 0 .  celecoxib (CELEBREX) 200 MG capsule, TAKE 1 CAPSULE BY MOUTH ONCE DAILY, Disp: 90 capsule, Rfl: 1 .  cetirizine (ZYRTEC) 10 MG tablet, Take 1 tablet by mouth daily., Disp: , Rfl:  .  Cholecalciferol (VITAMIN D-3 PO), Take 1,000 Int'l Units by mouth daily., Disp: , Rfl:  .  ferrous sulfate 325 (65 FE) MG EC tablet, Take 1 tablet by mouth daily., Disp: , Rfl:  .   fluticasone (FLONASE) 50 MCG/ACT nasal spray, Place 2 sprays into both nostrils daily., Disp: 16 g, Rfl: 6 .  Fluticasone-Salmeterol (ADVAIR) 100-50 MCG/DOSE AEPB, Inhale 1 puff into the lungs 2 (two) times daily., Disp: 60 each, Rfl: 5 .  glipiZIDE (GLUCOTROL XL) 10 MG 24 hr tablet, Take 1 tablet (10 mg total) by mouth daily with breakfast., Disp: 90 tablet, Rfl: 0 .  glucose blood (ADVOCATE REDI-CODE) test strip, 1 each by Other route QID., Disp: 100 each, Rfl: 12 .  hydrochlorothiazide (HYDRODIURIL) 25 MG tablet, Take 1 tablet (25 mg total) by mouth daily., Disp: 90 tablet, Rfl: 1 .  lisinopril (PRINIVIL,ZESTRIL) 40 MG tablet, TAKE ONE TABLET BY MOUTH ONCE DAILY AT NIGHT., Disp: 90 tablet, Rfl: 1 .  metFORMIN (GLUCOPHAGE-XR) 750 MG 24 hr tablet, Take 2 tablets (1,500 mg total) by mouth daily with breakfast., Disp: 180 tablet, Rfl: 1 .  Multiple Vitamins-Minerals (MULTIVITAMIN PO), Take 1 tablet by mouth daily., Disp: , Rfl:  .  Omega-3 Fatty Acids (FISH OIL BURP-LESS) 500 MG CAPS, Take 1 capsule by mouth daily. , Disp: , Rfl:  .  pioglitazone (ACTOS) 15 MG tablet, Take 1 tablet (15 mg total) by mouth daily., Disp: 90 tablet, Rfl: 1 .  ranitidine (ZANTAC) 150 MG tablet, TAKE 1 TABLET BY MOUTH TWICE DAILY. REPLACES OMEPRAZOLE., Disp: 180 tablet, Rfl: 1 .  Semaglutide (OZEMPIC) 0.25 or 0.5 MG/DOSE SOPN, Inject 0.5 mg into the skin once a week., Disp: 2 pen, Rfl: 2 .  tiZANidine (ZANAFLEX) 2 MG tablet, Take 2 mg by mouth daily as needed for muscle spasms. , Disp: , Rfl:  .  traMADol (ULTRAM) 50 MG tablet, Take 1 tablet (50 mg total) by mouth every 6 (six) hours as needed. for pain, Disp: 90 tablet, Rfl: 0 .  traZODone (DESYREL) 50 MG tablet, Take 0.5-1 tablets (25-50 mg total) by mouth at bedtime as needed for sleep., Disp: 90 tablet, Rfl: 1  No Known Allergies  ROS  Constitutional: Negative for fever or weight change.  HEENT: See HPI Respiratory: Negative for cough and shortness of breath.    Cardiovascular: Negative for chest pain or palpitations.  Gastrointestinal: Negative for abdominal pain, no bowel changes.  Musculoskeletal: Negative for gait problem or joint swelling.  Skin: Negative for rash.  Neurological: Negative for dizziness or headache.  No other specific complaints in a complete review of systems (except as listed in HPI above).  Objective  Vitals:   12/05/17 1028  BP: 130/76  Pulse: 72  Resp: 16  Temp: 98 F (36.7 C)  TempSrc: Oral  SpO2: 97%  Weight: 233 lb 9.6 oz (106 kg)  Height: 5' 4" (1.626 m)   Body mass index is 40.1 kg/m.  Nursing Note and Vital Signs reviewed.  Physical Exam  Constitutional: Patient appears well-developed and well-nourished. Obese No distress.  HEENT: head atraumatic, normocephalic, pupils equal and reactive to light, RIGHT conjunctiva is moderately erythematous, small amount  of dried purulent exudate on lashes present. EOM's intact, no maxillary or frontal sinus pain on palpation, neck supple without lymphadenopathy, oropharynx pink and moist without exudate Cardiovascular: Normal rate, regular rhythm, S1/S2 present.  No murmur or rub heard. No BLE edema. Pulmonary/Chest: Effort normal and breath sounds clear. No respiratory distress or retractions.  Mildly congested cough present intermittently during exam. Psychiatric: Patient has a normal mood and affect. behavior is normal. Judgment and thought content normal.  Recent Results (from the past 2160 hour(s))  POCT HgB A1C     Status: Abnormal   Collection Time: 11/14/17  8:00 AM  Result Value Ref Range   Hemoglobin A1C 9.0      Assessment & Plan  1. Bacterial conjunctivitis of right eye - erythromycin ophthalmic ointment; Place 1 application into the right eye 4 (four) times daily for 7 days.  Dispense: 3.5 g; Refill: 0 - Warm Compresses PRN.  2. Complicated grief - Advised to return if needed for referral to counseling and/or medication management.  3. DM  (diabetes mellitus), type 2, uncontrolled, with renal complications (HCC) - Continue to work on eating healthy and exercising; discussed risk of infection/worsening infections secondary to uncontrolled BG's.  4. Cough - benzonatate (TESSALON) 100 MG capsule; Take 1-2 capsules (100-200 mg total) by mouth 2 (two) times daily as needed.  Dispense: 40 capsule; Refill: 0 - Return in 5 days if not improving.  5. Bronchitis, chronic, mucopurulent (HCC) - Continue Advair daily. Return in 5 days if not improving. - albuterol (PROVENTIL HFA;VENTOLIN HFA) 108 (90 Base) MCG/ACT inhaler; Inhale 2 puffs into the lungs every 6 (six) hours as needed for wheezing or shortness of breath.  Dispense: 1 Inhaler; Refill: 0  -Red flags and when to present for emergency care or RTC including fever >101.23F, chest pain, shortness of breath that is unrelieved with medication, pain with ocular movement, periorbital swelling, new/worsening/un-resolving symptoms, reviewed with patient at time of visit. Follow up and care instructions discussed and provided in AVS.

## 2017-12-22 ENCOUNTER — Other Ambulatory Visit: Payer: Self-pay | Admitting: Family Medicine

## 2017-12-22 DIAGNOSIS — E1165 Type 2 diabetes mellitus with hyperglycemia: Principal | ICD-10-CM

## 2017-12-22 DIAGNOSIS — IMO0002 Reserved for concepts with insufficient information to code with codable children: Secondary | ICD-10-CM

## 2017-12-22 DIAGNOSIS — I10 Essential (primary) hypertension: Secondary | ICD-10-CM

## 2017-12-22 DIAGNOSIS — E1122 Type 2 diabetes mellitus with diabetic chronic kidney disease: Secondary | ICD-10-CM

## 2017-12-27 ENCOUNTER — Encounter: Payer: Self-pay | Admitting: Family Medicine

## 2017-12-27 ENCOUNTER — Ambulatory Visit (INDEPENDENT_AMBULATORY_CARE_PROVIDER_SITE_OTHER): Payer: PPO | Admitting: Family Medicine

## 2017-12-27 VITALS — BP 136/64 | HR 96 | Temp 97.9°F | Resp 16 | Ht 64.0 in | Wt 233.7 lb

## 2017-12-27 DIAGNOSIS — E1165 Type 2 diabetes mellitus with hyperglycemia: Secondary | ICD-10-CM | POA: Diagnosis not present

## 2017-12-27 DIAGNOSIS — IMO0002 Reserved for concepts with insufficient information to code with codable children: Secondary | ICD-10-CM

## 2017-12-27 DIAGNOSIS — E1122 Type 2 diabetes mellitus with diabetic chronic kidney disease: Secondary | ICD-10-CM | POA: Diagnosis not present

## 2017-12-27 DIAGNOSIS — N182 Chronic kidney disease, stage 2 (mild): Secondary | ICD-10-CM

## 2017-12-27 NOTE — Progress Notes (Signed)
Name: Cindy Griffin   MRN: [id number]    DOB: 18-Jan-1946   Date:12/27/2017       Progress Note  Subjective  Chief Complaint  Chief Complaint  Patient presents with  . Follow-up    6 week F/U  . Diabetes    Checks BS once daily and brought her DM log. Lowest-76 Average-80-90 Highest-191.     HPI   DMII with nephropathy: she is cannot afford brand medications. We tried Synjardi and Victoza but had to stop because of cost. She was on Glipizide XL54m and Metformin 1500 mg daily, Actos 15 mg.  HbgA1C has gone up from 8.1% to 8.5%, to 9.0% Dec 2019. She was started on Ozempic on her last visit and still taking Metformin and Actos and Glipizide. She is walking every morning. States medication is curbing her appetite and is eating smaller portions, reviewed her sugar log and only twice fsbs went above 140 fasting, usually in the 90's pre-meals, and did not go above 180 post-prandially   Patient Active Problem List   Diagnosis Date Noted  . Hypercalcemia 11/23/2015  . DDD (degenerative disc disease), lumbar 07/28/2015  . Spinal stenosis at L4-L5 level 06/12/2015  . Benign essential HTN 05/25/2015  . Bunion 05/25/2015  . Cardiac enlargement 05/25/2015  . Cervical radicular pain 05/25/2015  . Back pain, chronic 05/25/2015  . Osteoarthritis 05/25/2015  . Dyslipidemia 05/25/2015  . Gastric reflux 05/25/2015  . Insomnia 05/25/2015  . Eczema intertrigo 05/25/2015  . Bronchitis, chronic, mucopurulent ([place] 05/25/2015  . Numerous moles 05/25/2015  . Morbid obesity ([place] 05/25/2015  . Perennial allergic rhinitis 05/25/2015  . DM (diabetes mellitus), type 2, uncontrolled, with renal complications ([place] [id number] . Snores 05/25/2015  . History of artificial joint 05/25/2015  . Degeneration of intervertebral disc of cervical region 02/06/2015  . Neuritis or radiculitis due to rupture of lumbar intervertebral disc 01/14/2015  . Abnormal presence of protein in urine 05/04/2010  . Vitamin D  deficiency 05/04/2010    Past Surgical History:  Procedure Laterality Date  . ABDOMINAL HYSTERECTOMY     due to cancer-partial  . BREAST BIOPSY Right    Benign  . COLONOSCOPY N/A 04/11/2015   Procedure: COLONOSCOPY;  Surgeon: PHulen Luster MD;  [facility];  Service: Gastroenterology;  Laterality: N/A;  . JOINT REPLACEMENT     left knee 2013 right knee 2005  . SHOULDER ARTHROSCOPY WITH OPEN ROTATOR CUFF REPAIR Left 05/18/2016   Procedure: SHOULDER ARTHROSCOPY WITH OPEN ROTATOR CUFF REPAIR,distal clavicle excision, decompression;  Surgeon: JCorky Mull MD;  Location: [facility] ORS;  Service: Orthopedics;  Laterality: Left;  . SPINAL FUSION     C-Spine  . Transforaminal Epidural  02/20/2015   Injection into cervical spine- C5-6 Dr. CPhyllis Ginger   Family History  Problem Relation Age of Onset  . Diabetes Mother   . Kidney disease Mother   . Hypertension Mother   . Asthma Daughter   . Diabetes Sister   . Diabetes Brother     Social History   Socioeconomic History  . Marital status: Widowed    Spouse name: Not on file  . Number of children: Not on file  . Years of education: Not on file  . Highest education level: Not on file  Social Needs  . Financial resource strain: Not on file  . Food insecurity - worry: Not on file  . Food insecurity - inability: Not on file  . Transportation needs - medical: Not on file  .  Transportation needs - non-medical: Not on file  Occupational History  . Not on file  Tobacco Use  . Smoking status: Never Smoker  . Smokeless tobacco: Never Used  Substance and Sexual Activity  . Alcohol use: Yes    Alcohol/week: 0.0 oz    Comment: occasionally  . Drug use: No  . Sexual activity: Not Currently  Other Topics Concern  . Not on file  Social History Narrative  . Not on file     Current Outpatient Medications:  .  acetaminophen (TYLENOL) 500 MG tablet, Take 1,000 mg by mouth every 6 (six) hours as needed (pain)., Disp: , Rfl:  .   albuterol (PROVENTIL HFA;VENTOLIN HFA) 108 (90 Base) MCG/ACT inhaler, Inhale 2 puffs into the lungs every 6 (six) hours as needed for wheezing or shortness of breath., Disp: 1 Inhaler, Rfl: 0 .  aspirin EC 81 MG tablet, Take 1 tablet (81 mg total) by mouth daily., Disp: 90 tablet, Rfl: 1 .  atorvastatin (LIPITOR) 80 MG tablet, TAKE ONE TABLET BY MOUTH ONCE DAILY, Disp: 90 tablet, Rfl: 1 .  Blood Glucose Monitoring Suppl (ACCU-CHEK NANO SMARTVIEW) W/DEVICE KIT, 1 Device by Does not apply route 3 (three) times daily., Disp: 1 kit, Rfl: 0 .  celecoxib (CELEBREX) 200 MG capsule, TAKE 1 CAPSULE BY MOUTH ONCE DAILY, Disp: 90 capsule, Rfl: 1 .  cetirizine (ZYRTEC) 10 MG tablet, Take 1 tablet by mouth daily., Disp: , Rfl:  .  Cholecalciferol (VITAMIN D-3 PO), Take 1,000 Int'l Units by mouth daily., Disp: , Rfl:  .  ferrous sulfate 325 (65 FE) MG EC tablet, Take 1 tablet by mouth daily., Disp: , Rfl:  .  fluticasone (FLONASE) 50 MCG/ACT nasal spray, Place 2 sprays into both nostrils daily., Disp: 16 g, Rfl: 6 .  Fluticasone-Salmeterol (ADVAIR) 100-50 MCG/DOSE AEPB, Inhale 1 puff into the lungs 2 (two) times daily., Disp: 60 each, Rfl: 5 .  glipiZIDE (GLUCOTROL XL) 10 MG 24 hr tablet, Take 1 tablet (10 mg total) by mouth daily with breakfast., Disp: 90 tablet, Rfl: 0 .  glucose blood (ADVOCATE REDI-CODE) test strip, 1 each by Other route QID., Disp: 100 each, Rfl: 12 .  hydrochlorothiazide (HYDRODIURIL) 25 MG tablet, TAKE ONE TABLET BY MOUTH ONCE DAILY, Disp: 90 tablet, Rfl: 1 .  lisinopril (PRINIVIL,ZESTRIL) 40 MG tablet, TAKE ONE TABLET BY MOUTH ONCE DAILY AT NIGHT., Disp: 90 tablet, Rfl: 1 .  metFORMIN (GLUCOPHAGE-XR) 750 MG 24 hr tablet, Take 2 tablets (1,500 mg total) by mouth daily with breakfast., Disp: 180 tablet, Rfl: 1 .  Multiple Vitamins-Minerals (MULTIVITAMIN PO), Take 1 tablet by mouth daily., Disp: , Rfl:  .  Omega-3 Fatty Acids (FISH OIL BURP-LESS) 500 MG CAPS, Take 1 capsule by mouth daily. ,  Disp: , Rfl:  .  pioglitazone (ACTOS) 15 MG tablet, TAKE 1 TABLET BY MOUTH ONCE DAILY, Disp: 90 tablet, Rfl: 1 .  ranitidine (ZANTAC) 150 MG tablet, TAKE 1 TABLET BY MOUTH TWICE DAILY. REPLACES OMEPRAZOLE., Disp: 180 tablet, Rfl: 1 .  Semaglutide (OZEMPIC) 0.25 or 0.5 MG/DOSE SOPN, Inject 0.5 mg into the skin once a week., Disp: 2 pen, Rfl: 2 .  tiZANidine (ZANAFLEX) 2 MG tablet, Take 2 mg by mouth daily as needed for muscle spasms. , Disp: , Rfl:  .  traMADol (ULTRAM) 50 MG tablet, Take 1 tablet (50 mg total) by mouth every 6 (six) hours as needed. for pain, Disp: 90 tablet, Rfl: 0 .  traZODone (DESYREL) 50 MG tablet, Take 0.5-1  tablets (25-50 mg total) by mouth at bedtime as needed for sleep., Disp: 90 tablet, Rfl: 1  No Known Allergies   ROS  Ten systems reviewed and is negative except as mentioned in HPI   Objective  Vitals:   12/27/17 0917  BP: 136/64  Pulse: 96  Resp: 16  Temp: 97.9 F (36.6 C)  TempSrc: Oral  SpO2: 97%  Weight: 233 lb 11.2 oz (106 kg)  Height: 5' 4"  (1.626 m)    Body mass index is 40.11 kg/m.  Physical Exam  Constitutional: Patient appears well-developed and well-nourished. Obese  No distress.  HEENT: head atraumatic, normocephalic, pupils equal and reactive to light,  neck supple, throat within normal limits Cardiovascular: Normal rate, regular rhythm and normal heart sounds.  No murmur heard. No BLE edema. Pulmonary/Chest: Effort normal and breath sounds normal. No respiratory distress. Abdominal: Soft.  There is no tenderness. Psychiatric: Patient has a normal mood and affect. behavior is normal. Judgment and thought content normal.  Recent Results (from the past 2160 hour(s))  POCT HgB A1C     Status: Abnormal   Collection Time: 11/14/17  8:00 AM  Result Value Ref Range   Hemoglobin A1C 9.0      PHQ2/9: Depression screen [facility] 2/9 03/14/2017 11/11/2016 07/15/2016 03/15/2016 11/21/2015  Decreased Interest 0 0 0 0 0  Down, Depressed, Hopeless  0 0 0 0 0  PHQ - 2 Score 0 0 0 0 0    Fall Risk: Fall Risk  11/14/2017 03/14/2017 11/11/2016 07/15/2016 03/15/2016  Falls in the past year? No No Yes No Yes  Number falls in past yr: - - 1 - 1  Injury with Fall? - - Yes - Yes  Comment - - - - hurt her left knee  Follow up - - Falls evaluation completed - -     Assessment & Plan  1. Uncontrolled type 2 diabetes mellitus with stage 2 chronic kidney disease, without long-term current use of insulin (HCC)  - Fructosamine  Continue all medications, we will consider going down on Glipizide on her next visit

## 2017-12-29 LAB — FRUCTOSAMINE: Fructosamine: 264 umol/L (ref 190–270)

## 2018-01-02 DIAGNOSIS — M5416 Radiculopathy, lumbar region: Secondary | ICD-10-CM | POA: Diagnosis not present

## 2018-01-02 DIAGNOSIS — M48062 Spinal stenosis, lumbar region with neurogenic claudication: Secondary | ICD-10-CM | POA: Diagnosis not present

## 2018-01-02 DIAGNOSIS — M5136 Other intervertebral disc degeneration, lumbar region: Secondary | ICD-10-CM | POA: Diagnosis not present

## 2018-01-10 DIAGNOSIS — H2513 Age-related nuclear cataract, bilateral: Secondary | ICD-10-CM | POA: Diagnosis not present

## 2018-01-12 ENCOUNTER — Encounter: Payer: Self-pay | Admitting: Family Medicine

## 2018-01-20 ENCOUNTER — Other Ambulatory Visit: Payer: Self-pay | Admitting: Family Medicine

## 2018-01-20 DIAGNOSIS — E1165 Type 2 diabetes mellitus with hyperglycemia: Principal | ICD-10-CM

## 2018-01-20 DIAGNOSIS — E1122 Type 2 diabetes mellitus with diabetic chronic kidney disease: Secondary | ICD-10-CM

## 2018-01-20 DIAGNOSIS — I10 Essential (primary) hypertension: Secondary | ICD-10-CM

## 2018-01-20 DIAGNOSIS — IMO0002 Reserved for concepts with insufficient information to code with codable children: Secondary | ICD-10-CM

## 2018-01-20 DIAGNOSIS — E785 Hyperlipidemia, unspecified: Secondary | ICD-10-CM

## 2018-01-20 NOTE — Telephone Encounter (Signed)
Refill request for diabetic medication:   Metformin 750 mg  Last office visit pertaining to diabetes: 12/27/2017  Follow-up on file. 03/29/2018  Lab Results  Component Value Date   HGBA1C 9.0 11/14/2017    Refill Request for Cholesterol medication. Atorvastatin 80 mg  Last physical: None indicated  Lab Results  Component Value Date   CHOL 118 07/15/2017   HDL 56 07/15/2017   LDLCALC 37 07/15/2017   TRIG 127 07/15/2017   CHOLHDL 2.1 07/15/2017   Refill request for Hypertension medication:  Lisinopril 40 mg  Last office visit pertaining to hypertension: 12/27/2017  BP Readings from Last 3 Encounters:  12/27/17 136/64  12/05/17 130/76  11/14/17 140/80   Lab Results  Component Value Date   CREATININE 0.88 07/15/2017   BUN 23 07/15/2017   NA 139 07/15/2017   K 4.8 07/15/2017   CL 103 07/15/2017   CO2 22 07/15/2017

## 2018-01-23 DIAGNOSIS — M1612 Unilateral primary osteoarthritis, left hip: Secondary | ICD-10-CM | POA: Diagnosis not present

## 2018-01-23 DIAGNOSIS — M5030 Other cervical disc degeneration, unspecified cervical region: Secondary | ICD-10-CM | POA: Diagnosis not present

## 2018-01-23 DIAGNOSIS — M5416 Radiculopathy, lumbar region: Secondary | ICD-10-CM | POA: Diagnosis not present

## 2018-01-23 DIAGNOSIS — M5412 Radiculopathy, cervical region: Secondary | ICD-10-CM | POA: Diagnosis not present

## 2018-01-23 DIAGNOSIS — M5136 Other intervertebral disc degeneration, lumbar region: Secondary | ICD-10-CM | POA: Diagnosis not present

## 2018-01-27 DIAGNOSIS — M1612 Unilateral primary osteoarthritis, left hip: Secondary | ICD-10-CM | POA: Diagnosis not present

## 2018-02-14 ENCOUNTER — Other Ambulatory Visit: Payer: Self-pay | Admitting: Family Medicine

## 2018-02-14 DIAGNOSIS — E1165 Type 2 diabetes mellitus with hyperglycemia: Principal | ICD-10-CM

## 2018-02-14 DIAGNOSIS — E1122 Type 2 diabetes mellitus with diabetic chronic kidney disease: Secondary | ICD-10-CM

## 2018-02-14 DIAGNOSIS — IMO0002 Reserved for concepts with insufficient information to code with codable children: Secondary | ICD-10-CM

## 2018-02-24 ENCOUNTER — Ambulatory Visit (INDEPENDENT_AMBULATORY_CARE_PROVIDER_SITE_OTHER): Payer: PPO

## 2018-02-24 VITALS — BP 110/60 | HR 78 | Temp 97.5°F | Resp 12 | Ht 64.0 in | Wt 238.6 lb

## 2018-02-24 DIAGNOSIS — Z1159 Encounter for screening for other viral diseases: Secondary | ICD-10-CM | POA: Diagnosis not present

## 2018-02-24 DIAGNOSIS — Z1211 Encounter for screening for malignant neoplasm of colon: Secondary | ICD-10-CM

## 2018-02-24 DIAGNOSIS — Z0000 Encounter for general adult medical examination without abnormal findings: Secondary | ICD-10-CM | POA: Diagnosis not present

## 2018-02-24 NOTE — Patient Instructions (Signed)
Cindy Griffin , Thank you for taking time to come for your Medicare Wellness Visit. I appreciate your ongoing commitment to your health goals. Please review the following plan we discussed and let me know if I can assist you in the future.   Screening recommendations/referrals: Colorectal Screening: Completed colonoscopy 04/11/15. Repeat every 3 years. You will receive a call from our office regarding your appointment Mammogram: Completed 08/29/17. Repeat every year  Bone Density: Completed 11/29/09. Osteoporotic screenings no longer required Lung Cancer Screening: You do not qualify for this screening Hepatitis C Screening: Completed today  Vision and Dental Exams: Recommended annual ophthalmology exams for early detection of glaucoma and other disorders of the eye Recommended annual dental exams for proper oral hygiene  Diabetic Exams: Recommended annual diabetic eye exams for early detection of retinopathy Recommended annual diabetic foot exams for early detection of peripheral neuropathy.  Diabetic Eye Exam: Completed 01/10/18 Diabetic Foot Exam: Completed 07/15/17  Vaccinations: Influenza vaccine: Up to date Pneumococcal vaccine: Completed series Tdap vaccine: Up to date Shingles vaccine: Please call your insurance company to determine your out of pocket expense for the Shingrix vaccine. You may also receive this vaccine at your local pharmacy or Health Dept.   Advanced directives: We have received a copy of your POA (Power of Attorney) This document can be located in your chart. Please bring a copy of your Living Will to your next appointment.  Conditions/risks identified: Recommend to decrease portion sizes by eating 3 small healthy meals and at least 2 healthy snacks per day.  Next appointment: Please schedule your Annual Wellness Visit with your Nurse Health Advisor in one year.  Preventive Care 72 Years and Older, Female Preventive care refers to lifestyle choices and visits with  your health care provider that can promote health and wellness. What does preventive care include?  A yearly physical exam. This is also called an annual well check.  Dental exams once or twice a year.  Routine eye exams. Ask your health care provider how often you should have your eyes checked.  Personal lifestyle choices, including:  Daily care of your teeth and gums.  Regular physical activity.  Eating a healthy diet.  Avoiding tobacco and drug use.  Limiting alcohol use.  Practicing safe sex.  Taking low-dose aspirin every day.  Taking vitamin and mineral supplements as recommended by your health care provider. What happens during an annual well check? The services and screenings done by your health care provider during your annual well check will depend on your age, overall health, lifestyle risk factors, and family history of disease. Counseling  Your health care provider may ask you questions about your:  Alcohol use.  Tobacco use.  Drug use.  Emotional well-being.  Home and relationship well-being.  Sexual activity.  Eating habits.  History of falls.  Memory and ability to understand (cognition).  Work and work Statistician.  Reproductive health. Screening  You may have the following tests or measurements:  Height, weight, and BMI.  Blood pressure.  Lipid and cholesterol levels. These may be checked every 5 years, or more frequently if you are over 69 years old.  Skin check.  Lung cancer screening. You may have this screening every year starting at age 51 if you have a 30-pack-year history of smoking and currently smoke or have quit within the past 15 years.  Fecal occult blood test (FOBT) of the stool. You may have this test every year starting at age 56.  Flexible sigmoidoscopy or  colonoscopy. You may have a sigmoidoscopy every 5 years or a colonoscopy every 10 years starting at age 45.  Hepatitis C blood test.  Hepatitis B blood  test.  Sexually transmitted disease (STD) testing.  Diabetes screening. This is done by checking your blood sugar (glucose) after you have not eaten for a while (fasting). You may have this done every 1-3 years.  Bone density scan. This is done to screen for osteoporosis. You may have this done starting at age 77.  Mammogram. This may be done every 1-2 years. Talk to your health care provider about how often you should have regular mammograms. Talk with your health care provider about your test results, treatment options, and if necessary, the need for more tests. Vaccines  Your health care provider may recommend certain vaccines, such as:  Influenza vaccine. This is recommended every year.  Tetanus, diphtheria, and acellular pertussis (Tdap, Td) vaccine. You may need a Td booster every 10 years.  Zoster vaccine. You may need this after age 74.  Pneumococcal 13-valent conjugate (PCV13) vaccine. One dose is recommended after age 29.  Pneumococcal polysaccharide (PPSV23) vaccine. One dose is recommended after age 43. Talk to your health care provider about which screenings and vaccines you need and how often you need them. This information is not intended to replace advice given to you by your health care provider. Make sure you discuss any questions you have with your health care provider. Document Released: 12/12/2015 Document Revised: 08/04/2016 Document Reviewed: 09/16/2015 Elsevier Interactive Patient Education  2017 [place] Prevention in the Home Falls can cause injuries. They can happen to people of all ages. There are many things you can do to make your home safe and to help prevent falls. What can I do on the outside of my home?  Regularly fix the edges of walkways and driveways and fix any cracks.  Remove anything that might make you trip as you walk through a door, such as a raised step or threshold.  Trim any bushes or trees on the path to your home.  Use  bright outdoor lighting.  Clear any walking paths of anything that might make someone trip, such as rocks or tools.  Regularly check to see if handrails are loose or broken. Make sure that both sides of any steps have handrails.  Any raised decks and porches should have guardrails on the edges.  Have any leaves, snow, or ice cleared regularly.  Use sand or salt on walking paths during winter.  Clean up any spills in your garage right away. This includes oil or grease spills. What can I do in the bathroom?  Use night lights.  Install grab bars by the toilet and in the tub and shower. Do not use towel bars as grab bars.  Use non-skid mats or decals in the tub or shower.  If you need to sit down in the shower, use a plastic, non-slip stool.  Keep the floor dry. Clean up any water that spills on the floor as soon as it happens.  Remove soap buildup in the tub or shower regularly.  Attach bath mats securely with double-sided non-slip rug tape.  Do not have throw rugs and other things on the floor that can make you trip. What can I do in the bedroom?  Use night lights.  Make sure that you have a light by your bed that is easy to reach.  Do not use any sheets or blankets that are too  big for your bed. They should not hang down onto the floor.  Have a firm chair that has side arms. You can use this for support while you get dressed.  Do not have throw rugs and other things on the floor that can make you trip. What can I do in the kitchen?  Clean up any spills right away.  Avoid walking on wet floors.  Keep items that you use a lot in easy-to-reach places.  If you need to reach something above you, use a strong step stool that has a grab bar.  Keep electrical cords out of the way.  Do not use floor polish or wax that makes floors slippery. If you must use wax, use non-skid floor wax.  Do not have throw rugs and other things on the floor that can make you trip. What can  I do with my stairs?  Do not leave any items on the stairs.  Make sure that there are handrails on both sides of the stairs and use them. Fix handrails that are broken or loose. Make sure that handrails are as long as the stairways.  Check any carpeting to make sure that it is firmly attached to the stairs. Fix any carpet that is loose or worn.  Avoid having throw rugs at the top or bottom of the stairs. If you do have throw rugs, attach them to the floor with carpet tape.  Make sure that you have a light switch at the top of the stairs and the bottom of the stairs. If you do not have them, ask someone to add them for you. What else can I do to help prevent falls?  Wear shoes that:  Do not have high heels.  Have rubber bottoms.  Are comfortable and fit you well.  Are closed at the toe. Do not wear sandals.  If you use a stepladder:  Make sure that it is fully opened. Do not climb a closed stepladder.  Make sure that both sides of the stepladder are locked into place.  Ask someone to hold it for you, if possible.  Clearly mark and make sure that you can see:  Any grab bars or handrails.  First and last steps.  Where the edge of each step is.  Use tools that help you move around (mobility aids) if they are needed. These include:  Canes.  Walkers.  Scooters.  Crutches.  Turn on the lights when you go into a dark area. Replace any light bulbs as soon as they burn out.  Set up your furniture so you have a clear path. Avoid moving your furniture around.  If any of your floors are uneven, fix them.  If there are any pets around you, be aware of where they are.  Review your medicines with your doctor. Some medicines can make you feel dizzy. This can increase your chance of falling. Ask your doctor what other things that you can do to help prevent falls. This information is not intended to replace advice given to you by your health care provider. Make sure you  discuss any questions you have with your health care provider. Document Released: 09/11/2009 Document Revised: 04/22/2016 Document Reviewed: 12/20/2014 Elsevier Interactive Patient Education  2017 [employer].
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
## 2018-02-25 LAB — HEPATITIS C ANTIBODY
Hepatitis C Ab: NONREACTIVE
SIGNAL TO CUT-OFF: 0.02 (ref <1.00)

## 2018-02-28 ENCOUNTER — Telehealth: Payer: Self-pay | Admitting: Gastroenterology

## 2018-02-28 ENCOUNTER — Telehealth: Payer: Self-pay

## 2018-02-28 ENCOUNTER — Other Ambulatory Visit: Payer: Self-pay

## 2018-02-28 DIAGNOSIS — Z1211 Encounter for screening for malignant neoplasm of colon: Secondary | ICD-10-CM

## 2018-02-28 NOTE — Telephone Encounter (Signed)
Copied from [place] [id number]. Topic: General - Other >> Feb 27, 2018 12:45 PM Lennox Solders wrote: Reason for CRM:pt is returning Kaysia Willard call >> Feb 27, 2018  1:24 PM Conception Chancy, NT wrote: Patient is following up on returning [place] phone call. Please contact pt.

## 2018-02-28 NOTE — Telephone Encounter (Signed)
Patient LVM returning a call for a colonoscopy

## 2018-02-28 NOTE — Telephone Encounter (Signed)
Patient notified about her Hepatitis C results being Negative by phone.

## 2018-03-01 NOTE — Telephone Encounter (Signed)
Gastroenterology Pre-Procedure Review  Request Date: 03/13/18 Requesting Physician: Dr. Vicente Males  PATIENT REVIEW QUESTIONS: The patient responded to the following health history questions as indicated:    1. Are you having any GI issues? Gas 2. Do you have a personal history of Polyps? yes (3 years ago polyps) 3. Do you have a family history of Colon Cancer or Polyps? no 4. Diabetes Mellitus? Yes oral and injectable 5. Joint replacements in the past 12 months?no 6. Major health problems in the past 3 months?no 7. Any artificial heart valves, MVP, or defibrillator?no    MEDICATIONS & ALLERGIES:    Patient reports the following regarding taking any anticoagulation/antiplatelet therapy:   Plavix, Coumadin, Eliquis, Xarelto, Lovenox, Pradaxa, Brilinta, or Effient? no Aspirin? yes (81 mg daily)  Patient confirms/reports the following medications:  Current Outpatient Medications  Medication Sig Dispense Refill  . acetaminophen (TYLENOL) 500 MG tablet Take 1,000 mg by mouth every 6 (six) hours as needed (pain).    [name] albuterol (PROVENTIL HFA;VENTOLIN HFA) 108 (90 Base) MCG/ACT inhaler Inhale 2 puffs into the lungs every 6 (six) hours as needed for wheezing or shortness of breath. 1 Inhaler 0  . aspirin EC 81 MG tablet Take 1 tablet (81 mg total) by mouth daily. 90 tablet 1  . atorvastatin (LIPITOR) 80 MG tablet Take 1 tablet (80 mg total) by mouth daily. 90 tablet 1  . Blood Glucose Monitoring Suppl (ACCU-CHEK NANO SMARTVIEW) W/DEVICE KIT 1 Device by Does not apply route 3 (three) times daily. 1 kit 0  . celecoxib (CELEBREX) 200 MG capsule TAKE 1 CAPSULE BY MOUTH ONCE DAILY 90 capsule 1  . cetirizine (ZYRTEC) 10 MG tablet Take 1 tablet by mouth daily.    . Cholecalciferol (VITAMIN D-3 PO) Take 1,000 Int'l Units by mouth daily.    . ferrous sulfate 325 (65 FE) MG EC tablet Take 1 tablet by mouth daily.    . fluticasone (FLONASE) 50 MCG/ACT nasal spray Place 2 sprays into both nostrils daily. 16 g  6  . GLIPIZIDE XL 10 MG 24 hr tablet TAKE 1 TABLET BY MOUTH ONCE DAILY WITH BREAKFAST 90 tablet 0  . glucose blood (ADVOCATE REDI-CODE) test strip 1 each by Other route QID. 100 each 12  . hydrochlorothiazide (HYDRODIURIL) 25 MG tablet TAKE ONE TABLET BY MOUTH ONCE DAILY 90 tablet 1  . lisinopril (PRINIVIL,ZESTRIL) 40 MG tablet Take 1 tablet (40 mg total) by mouth daily. 90 tablet 1  . metFORMIN (GLUCOPHAGE-XR) 750 MG 24 hr tablet Take 1 tablet (750 mg total) by mouth daily with breakfast. 180 tablet 1  . Multiple Vitamins-Minerals (MULTIVITAMIN PO) Take 1 tablet by mouth daily.    . Omega-3 Fatty Acids (FISH OIL BURP-LESS) 500 MG CAPS Take 1 capsule by mouth daily.     . pioglitazone (ACTOS) 15 MG tablet TAKE 1 TABLET BY MOUTH ONCE DAILY 90 tablet 1  . ranitidine (ZANTAC) 150 MG tablet TAKE 1 TABLET BY MOUTH TWICE DAILY. REPLACES OMEPRAZOLE. 180 tablet 1  . Semaglutide (OZEMPIC) 0.25 or 0.5 MG/DOSE SOPN Inject 0.5 mg into the skin once a week. 2 pen 2  . tiZANidine (ZANAFLEX) 2 MG tablet Take 2 mg by mouth daily as needed for muscle spasms.     . traMADol (ULTRAM) 50 MG tablet Take 1 tablet (50 mg total) by mouth every 6 (six) hours as needed. for pain 90 tablet 0  . traZODone (DESYREL) 50 MG tablet Take 0.5-1 tablets (25-50 mg total) by mouth at bedtime as needed  for sleep. 90 tablet 1   No current facility-administered medications for this visit.     Patient confirms/reports the following allergies:  No Known Allergies  No orders of the defined types were placed in this encounter.   AUTHORIZATION INFORMATION Primary Insurance: 1D#: Group #:  Secondary Insurance: 1D#: Group #:  SCHEDULE INFORMATION: Date: 03/13/18 Time: Location:[facility]

## 2018-03-06 DIAGNOSIS — M1612 Unilateral primary osteoarthritis, left hip: Secondary | ICD-10-CM | POA: Diagnosis not present

## 2018-03-06 DIAGNOSIS — M48062 Spinal stenosis, lumbar region with neurogenic claudication: Secondary | ICD-10-CM | POA: Diagnosis not present

## 2018-03-06 DIAGNOSIS — M5412 Radiculopathy, cervical region: Secondary | ICD-10-CM | POA: Diagnosis not present

## 2018-03-06 DIAGNOSIS — M5136 Other intervertebral disc degeneration, lumbar region: Secondary | ICD-10-CM | POA: Diagnosis not present

## 2018-03-06 DIAGNOSIS — M5416 Radiculopathy, lumbar region: Secondary | ICD-10-CM | POA: Diagnosis not present

## 2018-03-13 ENCOUNTER — Encounter
Admission: RE | Disposition: A | Discharge status: Home / Self Care | Payer: Self-pay | Source: Ambulatory Visit | Attending: Gastroenterology

## 2018-03-13 ENCOUNTER — Ambulatory Visit: Payer: PPO | Admitting: Anesthesiology

## 2018-03-13 ENCOUNTER — Ambulatory Visit
Admission: RE | Admit: 2018-03-13 | Discharge: 2018-03-13 | Disposition: A | Discharge status: Home / Self Care | Payer: PPO | Source: Ambulatory Visit | Attending: Gastroenterology | Admitting: Gastroenterology

## 2018-03-13 ENCOUNTER — Encounter: Payer: Self-pay | Admitting: Anesthesiology

## 2018-03-13 DIAGNOSIS — G4700 Insomnia, unspecified: Secondary | ICD-10-CM | POA: Diagnosis not present

## 2018-03-13 DIAGNOSIS — Z79899 Other long term (current) drug therapy: Secondary | ICD-10-CM | POA: Insufficient documentation

## 2018-03-13 DIAGNOSIS — I10 Essential (primary) hypertension: Secondary | ICD-10-CM | POA: Diagnosis not present

## 2018-03-13 DIAGNOSIS — K640 First degree hemorrhoids: Secondary | ICD-10-CM | POA: Insufficient documentation

## 2018-03-13 DIAGNOSIS — Z96652 Presence of left artificial knee joint: Secondary | ICD-10-CM | POA: Diagnosis not present

## 2018-03-13 DIAGNOSIS — Z86010 Personal history of colonic polyps: Secondary | ICD-10-CM | POA: Insufficient documentation

## 2018-03-13 DIAGNOSIS — Z6841 Body Mass Index (BMI) 40.0 and over, adult: Secondary | ICD-10-CM | POA: Insufficient documentation

## 2018-03-13 DIAGNOSIS — E785 Hyperlipidemia, unspecified: Secondary | ICD-10-CM | POA: Diagnosis not present

## 2018-03-13 DIAGNOSIS — Z8542 Personal history of malignant neoplasm of other parts of uterus: Secondary | ICD-10-CM | POA: Insufficient documentation

## 2018-03-13 DIAGNOSIS — E119 Type 2 diabetes mellitus without complications: Secondary | ICD-10-CM | POA: Insufficient documentation

## 2018-03-13 DIAGNOSIS — M549 Dorsalgia, unspecified: Secondary | ICD-10-CM | POA: Diagnosis not present

## 2018-03-13 DIAGNOSIS — E1129 Type 2 diabetes mellitus with other diabetic kidney complication: Secondary | ICD-10-CM | POA: Diagnosis not present

## 2018-03-13 DIAGNOSIS — K5730 Diverticulosis of large intestine without perforation or abscess without bleeding: Secondary | ICD-10-CM | POA: Insufficient documentation

## 2018-03-13 DIAGNOSIS — D123 Benign neoplasm of transverse colon: Secondary | ICD-10-CM | POA: Insufficient documentation

## 2018-03-13 DIAGNOSIS — E559 Vitamin D deficiency, unspecified: Secondary | ICD-10-CM | POA: Diagnosis not present

## 2018-03-13 DIAGNOSIS — K633 Ulcer of intestine: Secondary | ICD-10-CM | POA: Diagnosis not present

## 2018-03-13 DIAGNOSIS — D124 Benign neoplasm of descending colon: Secondary | ICD-10-CM | POA: Diagnosis not present

## 2018-03-13 DIAGNOSIS — Z7984 Long term (current) use of oral hypoglycemic drugs: Secondary | ICD-10-CM | POA: Insufficient documentation

## 2018-03-13 DIAGNOSIS — Z79891 Long term (current) use of opiate analgesic: Secondary | ICD-10-CM | POA: Insufficient documentation

## 2018-03-13 DIAGNOSIS — Z7982 Long term (current) use of aspirin: Secondary | ICD-10-CM | POA: Insufficient documentation

## 2018-03-13 DIAGNOSIS — Z1211 Encounter for screening for malignant neoplasm of colon: Secondary | ICD-10-CM | POA: Diagnosis not present

## 2018-03-13 DIAGNOSIS — D126 Benign neoplasm of colon, unspecified: Secondary | ICD-10-CM | POA: Diagnosis not present

## 2018-03-13 DIAGNOSIS — K649 Unspecified hemorrhoids: Secondary | ICD-10-CM | POA: Diagnosis not present

## 2018-03-13 DIAGNOSIS — K219 Gastro-esophageal reflux disease without esophagitis: Secondary | ICD-10-CM | POA: Diagnosis not present

## 2018-03-13 DIAGNOSIS — D120 Benign neoplasm of cecum: Secondary | ICD-10-CM | POA: Diagnosis not present

## 2018-03-13 DIAGNOSIS — E669 Obesity, unspecified: Secondary | ICD-10-CM | POA: Insufficient documentation

## 2018-03-13 DIAGNOSIS — Z7951 Long term (current) use of inhaled steroids: Secondary | ICD-10-CM | POA: Diagnosis not present

## 2018-03-13 DIAGNOSIS — G8929 Other chronic pain: Secondary | ICD-10-CM | POA: Insufficient documentation

## 2018-03-13 DIAGNOSIS — K635 Polyp of colon: Secondary | ICD-10-CM | POA: Diagnosis not present

## 2018-03-13 DIAGNOSIS — K5790 Diverticulosis of intestine, part unspecified, without perforation or abscess without bleeding: Secondary | ICD-10-CM | POA: Diagnosis not present

## 2018-03-13 HISTORY — PX: COLONOSCOPY WITH PROPOFOL: SHX5780

## 2018-03-13 LAB — GLUCOSE, CAPILLARY: Glucose-Capillary: 141 mg/dL — ABNORMAL HIGH (ref 65–99)

## 2018-03-13 SURGERY — COLONOSCOPY WITH PROPOFOL
Anesthesia: General

## 2018-03-13 MED ORDER — PROPOFOL 10 MG/ML IV BOLUS
INTRAVENOUS | Status: DC | PRN
  Administered 2018-03-13: 80 mg via INTRAVENOUS

## 2018-03-13 MED ORDER — LIDOCAINE HCL (PF) 2 % IJ SOLN
INTRAMUSCULAR | Status: AC
  Filled 2018-03-13: qty 10

## 2018-03-13 MED ORDER — PROPOFOL 500 MG/50ML IV EMUL
INTRAVENOUS | Status: DC | PRN
  Administered 2018-03-13: 120 ug/kg/min via INTRAVENOUS

## 2018-03-13 MED ORDER — SODIUM CHLORIDE 0.9 % IV SOLN
INTRAVENOUS | Status: DC
  Administered 2018-03-13: 1000 mL via INTRAVENOUS

## 2018-03-13 MED ORDER — LIDOCAINE HCL (PF) 1 % IJ SOLN
INTRAMUSCULAR | Status: AC
Start: 2018-03-13 — End: 2018-03-13
  Administered 2018-03-13: 0.3 mL
  Filled 2018-03-13: qty 2

## 2018-03-13 MED ORDER — MIDAZOLAM HCL 2 MG/2ML IJ SOLN
INTRAMUSCULAR | Status: AC
  Filled 2018-03-13: qty 2

## 2018-03-13 MED ORDER — MIDAZOLAM HCL 2 MG/2ML IJ SOLN
INTRAMUSCULAR | Status: DC | PRN
  Administered 2018-03-13 (×2): 1 mg via INTRAVENOUS

## 2018-03-13 MED ORDER — LIDOCAINE HCL (CARDIAC) 20 MG/ML IV SOLN
INTRAVENOUS | Status: DC | PRN
  Administered 2018-03-13: 50 mg via INTRATRACHEAL

## 2018-03-13 MED ORDER — SPOT INK MARKER SYRINGE KIT
PACK | SUBMUCOSAL | Status: DC | PRN
  Administered 2018-03-13: 25 mL via SUBMUCOSAL

## 2018-03-13 MED ORDER — PROPOFOL 10 MG/ML IV BOLUS
INTRAVENOUS | Status: AC
  Filled 2018-03-13: qty 20

## 2018-03-13 NOTE — Transfer of Care (Signed)
Immediate Anesthesia Transfer of Care Note  Patient: Cindy Griffin  Procedure(s) Performed: COLONOSCOPY WITH PROPOFOL (N/A )  Patient Location: PACU and Endoscopy Unit  Anesthesia Type:General  Level of Consciousness: awake, alert  and oriented  Airway & Oxygen Therapy: Patient Spontanous Breathing and Patient connected to nasal cannula oxygen  Post-op Assessment: Report given to RN and Post -op Vital signs reviewed and stable  Post vital signs: Reviewed and stable  Last Vitals:  Vitals Value Taken Time  BP 106/78 03/13/2018 11:46 AM  Temp    Pulse 73 03/13/2018 11:49 AM  Resp 15 03/13/2018 11:49 AM  SpO2 99 % 03/13/2018 11:49 AM  Vitals shown include unvalidated device data.  Last Pain:  Vitals:   03/13/18 1135  TempSrc: Tympanic  PainSc:          Complications: No apparent anesthesia complications

## 2018-03-13 NOTE — Op Note (Signed)
[facility] Gastroenterology Patient Name: Dayelin Balducci Procedure Date: 03/13/2018 10:34 AM MRN: [id number] Account #: [id number] Date of Birth: [birth year] Admit Type: Outpatient Age: 72 Room: [facility] ENDO ROOM 4 Gender: Female Note Status: Finalized Procedure:            Colonoscopy Indications:          High risk colon cancer surveillance: Personal history                        of colonic polyps Providers:            [name] MD, MD Referring MD:         Bethena Roys. Sowles, MD (Referring MD) Medicines:            Monitored Anesthesia Care Complications:        No immediate complications. Procedure:            Pre-Anesthesia Assessment:                       - Prior to the procedure, a History and Physical was                        performed, and patient medications, allergies and                        sensitivities were reviewed. The patient's tolerance of                        previous anesthesia was reviewed.                       - The risks and benefits of the procedure and the                        sedation options and risks were discussed with the                        patient. All questions were answered and informed                        consent was obtained.                       - ASA Grade Assessment: II - A patient with mild                        systemic disease.                       After obtaining informed consent, the colonoscope was                        passed under direct vision. Throughout the procedure,                        the patient's blood pressure, pulse, and oxygen                        saturations were monitored continuously. The  Colonoscope was introduced through the anus and                        advanced to the the cecum, identified by the                        appendiceal orifice, IC valve and transillumination.                        The colonoscopy was somewhat difficult due to           inadequate bowel prep. The patient tolerated the                        procedure well. The quality of the bowel preparation                        was poor. Findings:      The perianal and digital rectal examinations were normal.      Non-bleeding internal hemorrhoids were found during retroflexion. The       hemorrhoids were moderate and Grade I (internal hemorrhoids that do not       prolapse).      A few small-mouthed diverticula were found in the sigmoid colon.      Two sessile polyps were found in the descending colon and cecum. The       polyps were 7 to 9 mm in size. These polyps were removed with a cold       snare. Resection and retrieval were complete.      A 6 mm polyp was found in the transverse colon. The polyp was sessile.       The polyp was removed with a cold snare. Resection and retrieval were       complete.      A 3 mm polyp was found in the cecum. The polyp was sessile. The polyp       was removed with a cold biopsy forceps. Resection and retrieval were       complete.      A 20 mm polypoid lesion was found at the hepatic flexure. The lesion was       polypoid and ulcerated. No bleeding was present. This was biopsied with       a cold forceps for histology. Area was successfully injected with 20 mL       Spot (carbon black) for tattooing.      The exam was otherwise without abnormality on direct and retroflexion       views. Impression:           - Preparation of the colon was poor.                       - Non-bleeding internal hemorrhoids.                       - Diverticulosis in the sigmoid colon.                       - Two 7 to 9 mm polyps in the descending colon and in                        the cecum, removed with a cold snare. Resected and  retrieved.                       - One 6 mm polyp in the transverse colon, removed with                        a cold snare. Resected and retrieved.                       - One 3 mm polyp  in the cecum, removed with a cold                        biopsy forceps. Resected and retrieved.                       - Rule out malignancy, polypoid lesion at the hepatic                        flexure. Biopsied. Injected.                       - The examination was otherwise normal on direct and                        retroflexion views. Recommendation:       - Discharge patient to home (with escort).                       - Resume previous diet.                       - Continue present medications.                       - Await pathology results.                       - Based on the biopsy results of the polyp in the                        hepatic flexure will decide on next steps.                       I will see her in my office next week to discuss the                        results. Procedure Code(s):    --- Professional ---                       [phone], Colonoscopy, flexible; with removal of tumor(s),                        polyp(s), or other lesion(s) by snare technique                       45380, 59, Colonoscopy, flexible; with biopsy, single                        or multiple                       45381, Colonoscopy, flexible; with directed  submucosal                        injection(s), any substance Diagnosis Code(s):    --- Professional ---                       Z86.010, Personal history of colonic polyps                       K64.0, First degree hemorrhoids                       D12.4, Benign neoplasm of descending colon                       D12.0, Benign neoplasm of cecum                       D12.3, Benign neoplasm of transverse colon (hepatic                        flexure or splenic flexure)                       D49.0, Neoplasm of unspecified behavior of digestive                        system CPT copyright 2017 American Medical Association. All rights reserved. The codes documented in this report are preliminary and upon coder review may  be revised to meet  current compliance requirements. [name], MD [name] MD, MD 03/13/2018 11:34:31 AM This report has been signed electronically. Number of Addenda: 0 Note Initiated On: 03/13/2018 10:34 AM Scope Withdrawal Time: 0 hours 31 minutes 23 seconds  Total Procedure Duration: 0 hours 36 minutes 25 seconds       [facility]

## 2018-03-13 NOTE — Anesthesia Preprocedure Evaluation (Signed)
Anesthesia Evaluation  Patient identified by MRN, date of birth, ID band Patient awake    Reviewed: Allergy & Precautions, NPO status , Patient's Chart, lab work & pertinent test results, reviewed documented beta blocker date and time   Airway Mallampati: III  TM Distance: >3 FB     Dental  (+) Upper Dentures, Lower Dentures   Pulmonary           Cardiovascular hypertension, Pt. on medications      Neuro/Psych  Neuromuscular disease    GI/Hepatic GERD  ,  Endo/Other  diabetes, Type 2  Renal/GU Renal disease     Musculoskeletal   Abdominal   Peds  Hematology   Anesthesia Other Findings Obese.  Reproductive/Obstetrics                             Anesthesia Physical Anesthesia Plan  ASA: III  Anesthesia Plan: General   Post-op Pain Management:    Induction: Intravenous  PONV Risk Score and Plan:   Airway Management Planned:   Additional Equipment:   Intra-op Plan:   Post-operative Plan:   Informed Consent: I have reviewed the patients History and Physical, chart, labs and discussed the procedure including the risks, benefits and alternatives for the proposed anesthesia with the patient or authorized representative who has indicated his/her understanding and acceptance.     Plan Discussed with: CRNA  Anesthesia Plan Comments:         Anesthesia Quick Evaluation

## 2018-03-13 NOTE — Anesthesia Post-op Follow-up Note (Signed)
Anesthesia QCDR form completed.        

## 2018-03-13 NOTE — Anesthesia Postprocedure Evaluation (Signed)
Anesthesia Post Note  Patient: Cindy Griffin  Procedure(s) Performed: COLONOSCOPY WITH PROPOFOL (N/A )  Patient location during evaluation: Endoscopy Anesthesia Type: General Level of consciousness: awake and alert Pain management: pain level controlled Vital Signs Assessment: post-procedure vital signs reviewed and stable Respiratory status: spontaneous breathing, nonlabored ventilation, respiratory function stable and patient connected to nasal cannula oxygen Cardiovascular status: blood pressure returned to baseline and stable Postop Assessment: no apparent nausea or vomiting Anesthetic complications: no     Last Vitals:  Vitals:   03/13/18 1150 03/13/18 1200  BP:  119/79  Pulse: 73 70  Resp: 15 13  Temp:    SpO2: 99% 98%    Last Pain:  Vitals:   03/13/18 1200  TempSrc:   PainSc: 0-No pain                 Biannca Scantlin S

## 2018-03-15 ENCOUNTER — Encounter: Payer: Self-pay | Admitting: Gastroenterology

## 2018-03-15 LAB — SURGICAL PATHOLOGY
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
## 2018-03-20 ENCOUNTER — Telehealth: Payer: Self-pay

## 2018-03-20 NOTE — Telephone Encounter (Signed)
Advised patient of results per Dr. Vicente Males and scheduled office visit.   - Inform patient needs to come in to see me to discuss results of her polyp that was taken out that shows at least high grade dysplasia. I can see early next week

## 2018-03-21 ENCOUNTER — Encounter: Payer: Self-pay | Admitting: Gastroenterology

## 2018-03-21 ENCOUNTER — Ambulatory Visit (INDEPENDENT_AMBULATORY_CARE_PROVIDER_SITE_OTHER): Payer: PPO | Admitting: Gastroenterology

## 2018-03-21 VITALS — BP 132/74 | HR 73 | Ht 64.0 in | Wt 238.6 lb

## 2018-03-21 DIAGNOSIS — Z86010 Personal history of colonic polyps: Secondary | ICD-10-CM

## 2018-03-21 NOTE — Progress Notes (Signed)
132 

## 2018-03-21 NOTE — Progress Notes (Signed)
[name] MD, MRCP(U.K) [address]  [place]  [place], [place] [address]  Main: [phone]  Fax: [phone]   Primary Care Physician: Steele Sizer, MD  Primary Gastroenterologist:  Dr. [name]   Chief Complaint  Patient presents with  . Results    HPI: Cindy Griffin is a 72 y.o. female    I recently performed a colonoscopy on 03/13/18 for surveillance due to prior history of colon polyps. On the colonoscopy I noted diminutive polyps in the transverse colon , descending colon and cecum that I resected . The pathology returned as a tubular adenoma  I also did note a 20 mm polypoid lesion was found at the hepatic flexure. The lesion was polypoid and ulcerated. No bleeding was present. This was biopsied with a cold forceps for histology. Area was successfully injected with 20 mL Spot (carbon black) for tattooing.The polyp appeared very hard and firm . The appearance was suspicious for a high grade polyp . Pathology confirmed  LEAST HIGH GRADE DYSPLASIA ARISING IN A TUBULAR ADENOMA WITH ULCERATION.     Current Outpatient Medications  Medication Sig Dispense Refill  . acetaminophen (TYLENOL) 500 MG tablet Take 1,000 mg by mouth every 6 (six) hours as needed (pain).    [name] albuterol (PROVENTIL HFA;VENTOLIN HFA) 108 (90 Base) MCG/ACT inhaler Inhale 2 puffs into the lungs every 6 (six) hours as needed for wheezing or shortness of breath. 1 Inhaler 0  . aspirin 81 MG tablet Take by mouth.    [name] aspirin EC 81 MG tablet Take 1 tablet (81 mg total) by mouth daily. 90 tablet 1  . atorvastatin (LIPITOR) 80 MG tablet Take 1 tablet (80 mg total) by mouth daily. 90 tablet 1  . Blood Glucose Monitoring Suppl (ACCU-CHEK NANO SMARTVIEW) W/DEVICE KIT 1 Device by Does not apply route 3 (three) times daily. 1 kit 0  . celecoxib (CELEBREX) 200 MG capsule TAKE 1 CAPSULE BY MOUTH ONCE DAILY 90 capsule 1  . cetirizine (ZYRTEC) 10 MG tablet Take 1 tablet by mouth daily.    .  Cholecalciferol (VITAMIN D-3 PO) Take 1,000 Int'l Units by mouth daily.    . ferrous sulfate 325 (65 FE) MG EC tablet Take 1 tablet by mouth daily.    . fluticasone (FLONASE) 50 MCG/ACT nasal spray Place 2 sprays into both nostrils daily. 16 g 6  . GLIPIZIDE XL 10 MG 24 hr tablet TAKE 1 TABLET BY MOUTH ONCE DAILY WITH BREAKFAST 90 tablet 0  . glucose blood (ADVOCATE REDI-CODE) test strip 1 each by Other route QID. 100 each 12  . hydrochlorothiazide (HYDRODIURIL) 25 MG tablet TAKE ONE TABLET BY MOUTH ONCE DAILY 90 tablet 1  . lisinopril (PRINIVIL,ZESTRIL) 40 MG tablet Take 1 tablet (40 mg total) by mouth daily. 90 tablet 1  . metFORMIN (GLUCOPHAGE-XR) 750 MG 24 hr tablet Take 1 tablet (750 mg total) by mouth daily with breakfast. 180 tablet 1  . Multiple Vitamins-Minerals (MULTIVITAMIN PO) Take 1 tablet by mouth daily.    . Omega-3 Fatty Acids (FISH OIL BURP-LESS) 500 MG CAPS Take 1 capsule by mouth daily.     . pioglitazone (ACTOS) 15 MG tablet TAKE 1 TABLET BY MOUTH ONCE DAILY 90 tablet 1  . ranitidine (ZANTAC) 150 MG tablet TAKE 1 TABLET BY MOUTH TWICE DAILY. REPLACES OMEPRAZOLE. 180 tablet 1  . Semaglutide (OZEMPIC) 0.25 or 0.5 MG/DOSE SOPN Inject 0.5 mg into the skin once a week. 2 pen 2  . tiZANidine (ZANAFLEX) 2  MG tablet Take 2 mg by mouth daily as needed for muscle spasms.     . traMADol (ULTRAM) 50 MG tablet Take 1 tablet (50 mg total) by mouth every 6 (six) hours as needed. for pain 90 tablet 0  . traZODone (DESYREL) 50 MG tablet Take 0.5-1 tablets (25-50 mg total) by mouth at bedtime as needed for sleep. 90 tablet 1   No current facility-administered medications for this visit.     Allergies as of 03/21/2018  . (No Known Allergies)    ROS:  General: Negative for anorexia, weight loss, fever, chills, fatigue, weakness. ENT: Negative for hoarseness, difficulty swallowing , nasal congestion. CV: Negative for chest pain, angina, palpitations, dyspnea on exertion, peripheral edema.    Respiratory: Negative for dyspnea at rest, dyspnea on exertion, cough, sputum, wheezing.  GI: See history of present illness. GU:  Negative for dysuria, hematuria, urinary incontinence, urinary frequency, nocturnal urination.  Endo: Negative for unusual weight change.    Physical Examination:   BP 132/74 (BP Location: Left Arm, Patient Position: Sitting, Cuff Size: Large)   Pulse 73   Ht _0  (1.626 m)   Wt 238 lb 9.6 oz (108.2 kg)   BMI 40.96 kg/m   General: Well-nourished, well-developed in no acute distress.  Eyes: No icterus. Conjunctivae pink. Mouth: Oropharyngeal mucosa moist and pink , no lesions erythema or exudate. Lungs: Clear to auscultation bilaterally. Non-labored. Heart: Regular rate and rhythm, no murmurs rubs or gallops.  Abdomen: Bowel sounds are normal, nontender, nondistended, no hepatosplenomegaly or masses, no abdominal bruits or hernia , no rebound or guarding.   Extremities: No lower extremity edema. No clubbing or deformities. Neuro: Alert and oriented x 3.  Grossly intact. Skin: Warm and dry, no jaundice.   Psych: Alert and cooperative, normal mood and affect.   Imaging Studies: No results found.  Assessment and Plan:   Cindy Griffin is a 72 y.o. y/o female here to follow up for her recent colonoscopy . I saw an ulcerated large polyp not amenable to endoscopic resection at the hepatic flexure . Bx showed atleast high grade dysplasia. Area has been tattooed.  Plan  1. Refer to colorectal surgery to excise the segment of the colon with lymph node sampling  2. Repeat colonoscopy in 6 months if has neoplasm in the polyp  3. She is a [employer] witness 4. Advised to have children >40 years screened for colon cancer.    Dr [name]  MD,MRCP [facility]) Follow up in 3 months

## 2018-03-22 NOTE — Addendum Note (Signed)
Addended by: Peggye Ley on: 03/22/2018 11:41 AM   Modules accepted: Orders

## 2018-03-23 DIAGNOSIS — K635 Polyp of colon: Secondary | ICD-10-CM | POA: Diagnosis not present

## 2018-03-29 ENCOUNTER — Ambulatory Visit
Admission: RE | Admit: 2018-03-29 | Discharge: 2018-03-29 | Disposition: A | Discharge status: Home / Self Care | Payer: PPO | Source: Ambulatory Visit | Attending: Family Medicine | Admitting: Family Medicine

## 2018-03-29 ENCOUNTER — Encounter: Payer: Self-pay | Admitting: Family Medicine

## 2018-03-29 ENCOUNTER — Ambulatory Visit (INDEPENDENT_AMBULATORY_CARE_PROVIDER_SITE_OTHER): Payer: PPO | Admitting: Family Medicine

## 2018-03-29 VITALS — BP 130/78 | HR 83 | Temp 98.3°F | Resp 16 | Ht 64.0 in | Wt 237.7 lb

## 2018-03-29 DIAGNOSIS — E1122 Type 2 diabetes mellitus with diabetic chronic kidney disease: Secondary | ICD-10-CM | POA: Diagnosis not present

## 2018-03-29 DIAGNOSIS — K635 Polyp of colon: Secondary | ICD-10-CM | POA: Diagnosis not present

## 2018-03-29 DIAGNOSIS — E785 Hyperlipidemia, unspecified: Secondary | ICD-10-CM

## 2018-03-29 DIAGNOSIS — K219 Gastro-esophageal reflux disease without esophagitis: Secondary | ICD-10-CM | POA: Diagnosis not present

## 2018-03-29 DIAGNOSIS — N182 Chronic kidney disease, stage 2 (mild): Secondary | ICD-10-CM

## 2018-03-29 DIAGNOSIS — IMO0002 Reserved for concepts with insufficient information to code with codable children: Secondary | ICD-10-CM

## 2018-03-29 DIAGNOSIS — I10 Essential (primary) hypertension: Secondary | ICD-10-CM | POA: Diagnosis not present

## 2018-03-29 DIAGNOSIS — R05 Cough: Secondary | ICD-10-CM | POA: Diagnosis not present

## 2018-03-29 DIAGNOSIS — G4709 Other insomnia: Secondary | ICD-10-CM | POA: Diagnosis not present

## 2018-03-29 DIAGNOSIS — E1165 Type 2 diabetes mellitus with hyperglycemia: Secondary | ICD-10-CM

## 2018-03-29 DIAGNOSIS — Z01818 Encounter for other preprocedural examination: Secondary | ICD-10-CM

## 2018-03-29 DIAGNOSIS — J411 Mucopurulent chronic bronchitis: Secondary | ICD-10-CM | POA: Diagnosis not present

## 2018-03-29 DIAGNOSIS — D473 Essential (hemorrhagic) thrombocythemia: Secondary | ICD-10-CM | POA: Diagnosis not present

## 2018-03-29 LAB — CBC WITH DIFFERENTIAL/PLATELET
Basophils Absolute: 71 cells/uL (ref 0–200)
Basophils Relative: 1 %
Eosinophils Absolute: 291 cells/uL (ref 15–500)
Eosinophils Relative: 4.1 %
HCT: 36.9 % (ref 35.0–45.0)
Hemoglobin: 12.3 g/dL (ref 11.7–15.5)
Lymphs Abs: 1945 cells/uL (ref 850–3900)
MCH: 28 pg (ref 27.0–33.0)
MCHC: 33.3 g/dL (ref 32.0–36.0)
MCV: 84.1 fL (ref 80.0–100.0)
MPV: 10.3 fL (ref 7.5–12.5)
Monocytes Relative: 8.7 %
Neutro Abs: 4175 cells/uL (ref 1500–7800)
Neutrophils Relative %: 58.8 %
Platelets: 317 10*3/uL (ref 140–400)
RBC: 4.39 10*6/uL (ref 3.80–5.10)
RDW: 13.6 % (ref 11.0–15.0)
Total Lymphocyte: 27.4 %
WBC mixed population: 618 cells/uL (ref 200–950)
WBC: 7.1 10*3/uL (ref 3.8–10.8)

## 2018-03-29 LAB — POCT GLYCOSYLATED HEMOGLOBIN (HGB A1C): Hemoglobin A1C: 8

## 2018-03-29 MED ORDER — SEMAGLUTIDE (1 MG/DOSE) 2 MG/1.5ML ~~LOC~~ SOPN: 0.5000 mg | PEN_INJECTOR | SUBCUTANEOUS | 2 refills | Status: DC

## 2018-03-29 MED ORDER — BENZONATATE 100 MG PO CAPS: 100.0000 mg | ORAL_CAPSULE | Freq: Three times a day (TID) | ORAL | 0 refills | Status: DC | PRN

## 2018-03-29 MED ORDER — PIOGLITAZONE HCL 15 MG PO TABS: 15.0000 mg | ORAL_TABLET | Freq: Every day | ORAL | 1 refills | Status: DC

## 2018-03-29 MED ORDER — UMECLIDINIUM-VILANTEROL 62.5-25 MCG/INH IN AEPB: 1.0000 | INHALATION_SPRAY | Freq: Every day | RESPIRATORY_TRACT | 5 refills | Status: DC

## 2018-03-29 MED ORDER — RANITIDINE HCL 150 MG PO TABS: ORAL_TABLET | ORAL | 1 refills | Status: DC

## 2018-03-29 MED ORDER — HYDROCHLOROTHIAZIDE 25 MG PO TABS: 25.0000 mg | ORAL_TABLET | Freq: Every day | ORAL | 1 refills | Status: DC

## 2018-03-29 NOTE — Progress Notes (Signed)
Name: Cindy Griffin   MRN: [id number]    DOB: 05-12-1946   Date:03/29/2018       Progress Note  Subjective  Chief Complaint  Chief Complaint  Patient presents with  . Diabetes    checking once daily, Low-88 Average-104  . Hypertension    Edema in ankles-all the time  . Surgical clearance    [facility] to remove polyps and part of her colon-[employer]-does not want a transfusion  . Nasal Congestion    Cough-yellow mucus, tickle in her throat, nasal drainage    HPI  DMII with nephropathy: she is cannot afford brand medications. We tried Synjardi and Victoza but had to stop because of cost. She is currently on Glipizide XL47m and Metformin 1500 mg daily, Actos 15 mg, and glucose is trending up. She is not following diabetic diet and has not been walking in the past couple of months. Her hgbA1C has not been at goal since 07/2015. We changed to OBlancoin Dec 2018 she is tolerating medication well since. She has polyphagia, but denies polydipsia or polyuria. Urine micro normal. HbgA1C from 8.1% to 8.5% ,9.0%  down to 8.0% . She brought her sugar log and glucose before bed is at goal, not checking fasting glucose, explained that glucose could be dropping fasting and we may need to go down on Glipizide level  HTN: taking medication. No chest pain or palpitation, EKG is normal today, bp is at goal   Hyperlipidemia: taking Atorvastatin and denies side effects of medications, reviewed labs and it is at goal.   Cervical and lumbar radiculitis/OA: seeing Dr. CPhyllis Ginger had a Left L4-5 transforaminal epidural injection, she also had PT, and on 07/01/2017 she had a left C3-4 transforaminal epidural steroid injection under fluoroscopic guidance. Had left shoulder surgery, tingling on her hands has improved, and neck pain is stable also. She also has post-traumatic left knee OA , she takes Celebrex. No recent steroid injections.    Insomnia: she has difficulty falling and  staying asleep, she continues to take Trazodone prn and helps with her symptoms, but she does not like taking it every night. Unchanged   Morbid Obesity: she tried Contrave but made her vomit. We started her on Belvig in April 2017 and she lost weight, she was down 22 lbs by June when she had shoulder surgery, but she stopped exercising and taking medication and hadgained 12lbs post-surgery, but stopped Belviq because of cost, she is on Ozempic but gained a few more pounds since last visit, she will try to cut down on sweets and potatoes again.   Chronic Bronchitis: she has a chronic productive cough with multiple recent flares. She states cough has been worse over the past two weeks, sputums varies in color.  She states symptoms improves with albuterol, sob only with coughing spells, she states tessalon has helped her in the past, she would like antibiotics, but explained she needs maintenance.   GERD: symptoms are under control with Omeprazole but discussed potential long term risk, she is now on Ranitidine 150 mg twice daily and off Omeprazole since Summer 2018  Dysplastic polyp: here also for pre-op. She has a long history of surgeries and no complications from anesthesia. No previous history of heart attack or strokes, she wears dentures ( upper and lower) , denies decrease in exercise tolerance, able to go up a flight of stairs slowly.   Patient Active Problem List   Diagnosis Date Noted  . Hypercalcemia 11/23/2015  .  DDD (degenerative disc disease), lumbar 07/28/2015  . Spinal stenosis at L4-L5 level 06/12/2015  . Benign essential HTN 05/25/2015  . Bunion 05/25/2015  . Cardiac enlargement 05/25/2015  . Cervical radicular pain 05/25/2015  . Back pain, chronic 05/25/2015  . Osteoarthritis 05/25/2015  . Dyslipidemia 05/25/2015  . Gastric reflux 05/25/2015  . Insomnia 05/25/2015  . Eczema intertrigo 05/25/2015  . Bronchitis, chronic, mucopurulent ([place]) 05/25/2015  . Numerous moles  05/25/2015  . Morbid obesity ([place]) 05/25/2015  . Perennial allergic rhinitis 05/25/2015  . DM (diabetes mellitus), type 2, uncontrolled, with renal complications ([place]) 92/42/6834  . Snores 05/25/2015  . History of artificial joint 05/25/2015  . Degeneration of intervertebral disc of cervical region 02/06/2015  . Neuritis or radiculitis due to rupture of lumbar intervertebral disc 01/14/2015  . Abnormal presence of protein in urine 05/04/2010  . Vitamin D deficiency 05/04/2010    Past Surgical History:  Procedure Laterality Date  . ABDOMINAL HYSTERECTOMY     due to cancer-partial  . BREAST BIOPSY Right    Benign  . COLONOSCOPY N/A 04/11/2015   Procedure: COLONOSCOPY;  Surgeon: Hulen Luster, MD;  Location: [facility] ENDOSCOPY;  Service: Gastroenterology;  Laterality: N/A;  . COLONOSCOPY WITH PROPOFOL N/A 03/13/2018   Procedure: COLONOSCOPY WITH PROPOFOL;  Surgeon: [name], MD;  Location: [facility] ENDOSCOPY;  Service: Gastroenterology;  Laterality: N/A;  . JOINT REPLACEMENT     left knee 2013 right knee 2005  . SHOULDER ARTHROSCOPY WITH OPEN ROTATOR CUFF REPAIR Left 05/18/2016   Procedure: SHOULDER ARTHROSCOPY WITH OPEN ROTATOR CUFF REPAIR,distal clavicle excision, decompression;  Surgeon: Corky Mull, MD;  Location: [facility] ORS;  Service: Orthopedics;  Laterality: Left;  . SPINAL FUSION     C-Spine  . Transforaminal Epidural  02/20/2015   Injection into cervical spine- C5-6 Dr. Phyllis Ginger    Family History  Problem Relation Age of Onset  . Diabetes Mother   . Kidney disease Mother   . Hypertension Mother   . Healthy Father   . Asthma Daughter   . Diabetes Sister   . Kidney disease Sister   . Diabetes Brother     Social History   Socioeconomic History  . Marital status: Widowed    Spouse name: Jeneen Rinks  . Number of children: 4  . Years of education: some college  . Highest education level: 12th grade  Occupational History  . Occupation: Retired  Scientific laboratory technician  . Financial resource  strain: Not hard at all  . Food insecurity:    Worry: Never true    Inability: Never true  . Transportation needs:    Medical: No    Non-medical: No  Tobacco Use  . Smoking status: Never Smoker  . Smokeless tobacco: Never Used  . Tobacco comment: smoking cessation materials not required  Substance and Sexual Activity  . Alcohol use: Not Currently    Alcohol/week: 0.0 oz  . Drug use: No  . Sexual activity: Not Currently  Lifestyle  . Physical activity:    Days per week: 3 days    Minutes per session: 30 min  . Stress: Only a little  Relationships  . Social connections:    Talks on phone: Patient refused    Gets together: Patient refused    Attends religious service: Patient refused    Active member of club or organization: Patient refused    Attends meetings of clubs or organizations: Patient refused    Relationship status: Widowed  . Intimate partner violence:    Fear  of current or ex partner: No    Emotionally abused: No    Physically abused: No    Forced sexual activity: No  Other Topics Concern  . Not on file  Social History Narrative  . Not on file     Current Outpatient Medications:  .  acetaminophen (TYLENOL) 500 MG tablet, Take 1,000 mg by mouth every 6 (six) hours as needed (pain)., Disp: , Rfl:  .  albuterol (PROVENTIL HFA;VENTOLIN HFA) 108 (90 Base) MCG/ACT inhaler, Inhale 2 puffs into the lungs every 6 (six) hours as needed for wheezing or shortness of breath., Disp: 1 Inhaler, Rfl: 0 .  aspirin EC 81 MG tablet, Take 1 tablet (81 mg total) by mouth daily., Disp: 90 tablet, Rfl: 1 .  atorvastatin (LIPITOR) 80 MG tablet, Take 1 tablet (80 mg total) by mouth daily., Disp: 90 tablet, Rfl: 1 .  Blood Glucose Monitoring Suppl (ACCU-CHEK NANO SMARTVIEW) W/DEVICE KIT, 1 Device by Does not apply route 3 (three) times daily., Disp: 1 kit, Rfl: 0 .  celecoxib (CELEBREX) 200 MG capsule, TAKE 1 CAPSULE BY MOUTH ONCE DAILY, Disp: 90 capsule, Rfl: 1 .  cetirizine (ZYRTEC)  10 MG tablet, Take 1 tablet by mouth daily., Disp: , Rfl:  .  Cholecalciferol (VITAMIN D-3 PO), Take 1,000 Int'l Units by mouth daily., Disp: , Rfl:  .  ferrous sulfate 325 (65 FE) MG EC tablet, Take 1 tablet by mouth daily., Disp: , Rfl:  .  fluticasone (FLONASE) 50 MCG/ACT nasal spray, Place 2 sprays into both nostrils daily., Disp: 16 g, Rfl: 6 .  GLIPIZIDE XL 10 MG 24 hr tablet, TAKE 1 TABLET BY MOUTH ONCE DAILY WITH BREAKFAST, Disp: 90 tablet, Rfl: 0 .  glucose blood (ADVOCATE REDI-CODE) test strip, 1 each by Other route QID., Disp: 100 each, Rfl: 12 .  hydrochlorothiazide (HYDRODIURIL) 25 MG tablet, TAKE ONE TABLET BY MOUTH ONCE DAILY, Disp: 90 tablet, Rfl: 1 .  lisinopril (PRINIVIL,ZESTRIL) 40 MG tablet, Take 1 tablet (40 mg total) by mouth daily., Disp: 90 tablet, Rfl: 1 .  metFORMIN (GLUCOPHAGE-XR) 750 MG 24 hr tablet, Take 1 tablet (750 mg total) by mouth daily with breakfast., Disp: 180 tablet, Rfl: 1 .  Multiple Vitamins-Minerals (MULTIVITAMIN PO), Take 1 tablet by mouth daily., Disp: , Rfl:  .  pioglitazone (ACTOS) 15 MG tablet, TAKE 1 TABLET BY MOUTH ONCE DAILY, Disp: 90 tablet, Rfl: 1 .  ranitidine (ZANTAC) 150 MG tablet, TAKE 1 TABLET BY MOUTH TWICE DAILY. REPLACES OMEPRAZOLE., Disp: 180 tablet, Rfl: 1 .  Semaglutide (OZEMPIC) 0.25 or 0.5 MG/DOSE SOPN, Inject 0.5 mg into the skin once a week., Disp: 2 pen, Rfl: 2 .  tiZANidine (ZANAFLEX) 2 MG tablet, Take 2 mg by mouth daily as needed for muscle spasms. , Disp: , Rfl:  .  traMADol (ULTRAM) 50 MG tablet, Take 1 tablet (50 mg total) by mouth every 6 (six) hours as needed. for pain, Disp: 90 tablet, Rfl: 0 .  traZODone (DESYREL) 50 MG tablet, Take 0.5-1 tablets (25-50 mg total) by mouth at bedtime as needed for sleep., Disp: 90 tablet, Rfl: 1 .  neomycin (MYCIFRADIN) 500 MG tablet, , Disp: , Rfl: 0 .  SUPREP BOWEL PREP KIT 17.5-3.13-1.6 GM/177ML SOLN, , Disp: , Rfl: 0  No Known Allergies   ROS  Constitutional: Negative for fever  or significant weight change.  Respiratory: Positive  for cough but no  shortness of breath.   Cardiovascular: Negative for chest pain or palpitations.  Gastrointestinal:  Negative for abdominal pain, no bowel changes.  Musculoskeletal: Negative  for gait problem and no joint swelling.  Skin: Negative for rash.  Neurological: Negative for dizziness or headache.  No other specific complaints in a complete review of systems (except as listed in HPI above).  Objective  Vitals:   03/29/18 1102  BP: 130/78  Pulse: 83  Resp: 16  Temp: 98.3 F (36.8 C)  TempSrc: Oral  SpO2: 97%  Weight: 237 lb 11.2 oz (107.8 kg)  Height: 5' 4"  (1.626 m)    Body mass index is 40.8 kg/m.  Physical Exam  Constitutional: Patient appears well-developed and well-nourished. Obese No distress.  HEENT: head atraumatic, normocephalic, pupils equal and reactive to light,  neck supple, throat within normal limits Cardiovascular: Normal rate, regular rhythm and normal heart sounds.  No murmur heard. Trace BLE edema. Pulmonary/Chest: Effort normal and breath sounds normal. No respiratory distress. Abdominal: Soft.  There is no tenderness. Psychiatric: Patient has a normal mood and affect. behavior is normal. Judgment and thought content normal.   Recent Results (from the past 2160 hour(s))  Hepatitis C antibody screen     Status: None   Collection Time: 02/24/18 10:43 AM  Result Value Ref Range   Hepatitis C Ab NON-REACTIVE NON-REACTI   SIGNAL TO CUT-OFF 0.02 <1.00    Comment: . HCV antibody was non-reactive. There is no laboratory  evidence of HCV infection. . In most cases, no further action is required. However, if recent HCV exposure is suspected, a test for HCV RNA (test code [phone]) is suggested. . For additional information please refer to http://education.[facility].com/faq/FAQ22v1 (This link is being provided for informational/ educational purposes only.) .   Glucose, capillary      Status: Abnormal   Collection Time: 03/13/18 10:07 AM  Result Value Ref Range   Glucose-Capillary 141 (H) 65 - 99 mg/dL  Surgical pathology     Status: None   Collection Time: 03/13/18 10:58 AM  Result Value Ref Range   SURGICAL PATHOLOGY      Surgical Pathology CASE: [id number] PATIENT: Jamal Maes Surgical Pathology Report     SPECIMEN SUBMITTED: A. Colon polyp x2, cecum; cold snare cbx B. Colon polypoid lesion, hepatic flexure;cbxs C. Colon polyp, transverse;cold snare D. Colon polyp, descending;cold snare  CLINICAL HISTORY: None provided  PRE-OPERATIVE DIAGNOSIS: Screening Colonoscopy  POST-OPERATIVE DIAGNOSIS: Colon polyps, diverticulosis, hemorrhoids     DIAGNOSIS: A. COLON POLYP X2, CECUM; COLD SNARE AND COLD BIOPSY: - TUBULAR ADENOMA (2). - NEGATIVE FOR HIGH-GRADE DYSPLASIA AND MALIGNANCY.  B.  COLON POLYPOID LESION, HEPATIC FLEXURE; COLD BIOPSY: - AT LEAST HIGH GRADE DYSPLASIA ARISING IN A TUBULAR ADENOMA WITH ULCERATION. - SEE COMMENT. - DEEPER SECTIONS WERE EXAMINED.  C.  COLON POLYP, TRANSVERSE; COLD SNARE: - TUBULAR ADENOMA. - DETACHED FRAGMENT OF FIBULA NO PURULENT MATERIAL. - NEGATIVE FOR HIGH-GRADE DYSPLASIA AND MALIGNANCY.  D.  COLON POLYP, DESCENDING; COLD SNARE:  - FECAL DEBRIS ONLY. - DEEPER SECTIONS EXAMINED.  Comment: The findings in the polypoid lesion from the hepatic flexure (specimen B) may represent superficially sampled adenocarcinoma. Excision is advised. These findings were discussed with Dr. Vicente Males on 03/15/2018.  GROSS DESCRIPTION: A. Labeled: Cecum polyp cbx and cold snare (2 polyps) Received: In formalin Tissue fragment(s): 3 Size: 0.3-0.5 cm Description: Pink-Tan fragments Entirely submitted in one cassette.  B. Labeled: Hepatic flexure polypoid lesion cbx Received: In formalin Tissue fragment(s): Multiple Size: Aggregate, 1.1 x 0.2 x 0.1 cm Description: Tan fragments Entirely submitted in one cassette.  C.  Labeled: Transverse colon polyp cold snare Received: In formalin Tissue fragment(s): 2 Size: 0.1 and 0.3 cm Description: Pink-Tan fragments Entirely submitted in one cassette.  D. Labeled: Descending colon polyp cold snare Received: In formalin Tissue fragment(s): Multiple Size: Aggregate, 1.1  x 1.0 x 0.1 cm Description: Yellow to brown fecal material and possible tissue fragment Entirely submitted in one cassette.    Final Diagnosis performed by Quay Burow, MD.   Electronically signed 03/15/2018 1:16:36PM The electronic signature indicates that the named Attending Pathologist has evaluated the specimen  Technical component performed at [facility], [address], [place], [place] [address] Lab: [phone] Dir: Rush Farmer, MD, MMM  Professional component performed at [facility], [facility], [place], [place], [place] [address] Lab: [phone] Dir: Dellia Nims. Rubinas, MD   POCT HgB A1C     Status: Abnormal   Collection Time: 03/29/18 11:38 AM  Result Value Ref Range   Hemoglobin A1C 8.0       PHQ2/9: Depression screen [facility] 2/9 02/24/2018 03/14/2017 11/11/2016 07/15/2016 03/15/2016  Decreased Interest 0 0 0 0 0  Down, Depressed, Hopeless 1 0 0 0 0  PHQ - 2 Score 1 0 0 0 0  Altered sleeping 1 - - - -  Tired, decreased energy 1 - - - -  Change in appetite 1 - - - -  Feeling bad or failure about yourself  0 - - - -  Trouble concentrating 0 - - - -  Moving slowly or fidgety/restless 0 - - - -  Suicidal thoughts 0 - - - -  PHQ-9 Score 4 - - - -  Difficult doing work/chores Not difficult at all - - - -     Fall Risk: Fall Risk  03/29/2018 02/24/2018 11/14/2017 03/14/2017 11/11/2016  Falls in the past year? No No No No Yes  Number falls in past yr: - - - - 1  Injury with Fall? - - - - Yes  Comment - - - - -  Risk for fall due to : - Impaired balance/gait;Impaired vision - - -  Risk for fall due to: Comment - knee pain; arthritis; wears eyeglasses - - -   Follow up - - - - Falls evaluation completed    Functional Status Survey: Is the patient deaf or have difficulty hearing?: No Does the patient have difficulty seeing, even when wearing glasses/contacts?: No Does the patient have difficulty concentrating, remembering, or making decisions?: No Does the patient have difficulty walking or climbing stairs?: No Does the patient have difficulty dressing or bathing?: No Does the patient have difficulty doing errands alone such as visiting a doctor's office or shopping?: No   Assessment & Plan  1. Uncontrolled type 2 diabetes mellitus with stage 2 chronic kidney disease, without long-term current use of insulin (HCC)  - POCT HgB A1C - EKG 12-Lead - hydrochlorothiazide (HYDRODIURIL) 25 MG tablet; Take 1 tablet (25 mg total) by mouth daily.  Dispense: 90 tablet; Refill: 1 - pioglitazone (ACTOS) 15 MG tablet; Take 1 tablet (15 mg total) by mouth daily.  Dispense: 90 tablet; Refill: 1 - Semaglutide (OZEMPIC) 1 MG/DOSE SOPN; Inject 0.5-1 mg into the skin once a week.  Dispense: 9 mL; Refill: 2  2. Benign essential HTN  - EKG 12-Lead - hydrochlorothiazide (HYDRODIURIL) 25 MG tablet; Take 1 tablet (25 mg total) by mouth daily.  Dispense: 90 tablet; Refill: 1  3. Bronchitis, chronic, mucopurulent ([place])  She has chronic bronchitis, unlikely to need antibiotics, explained importance  of daily medication  - umeclidinium-vilanterol (ANORO ELLIPTA) 62.5-25 MCG/INH AEPB; Inhale 1 puff into the lungs daily.  Dispense: 60 each; Refill: 5 - benzonatate (TESSALON) 100 MG capsule; Take 1-2 capsules (100-200 mg total) by mouth 3 (three) times daily as needed.  Dispense: 40 capsule; Refill: 0  4. Morbid obesity due to excess calories [facility])  Discussed with the patient the risk posed by an increased BMI. Discussed importance of portion control, calorie counting and at least 150 minutes of physical activity weekly. Avoid sweet beverages and drink more water. Eat  at least 6 servings of fruit and vegetables daily   5. Dyslipidemia  Continue medication   6. Other insomnia  She still has medication at home  7. Gastric reflux  - ranitidine (ZANTAC) 150 MG tablet; TAKE 1 TABLET BY MOUTH TWICE DAILY. REPLACES OMEPRAZOLE.  Dispense: 180 tablet; Refill: 1  8. Essential hemorrhagic thrombocythemia ([place])  stable  9. Pre-op evaluation  She has a cough, we will get labs and once back we will clear her for surgery, hgbA1C has improved, advised to have a more strict diabetic diet to decrease risk of infections on the perioperative period. Normal EKG today, good exercise tolerance. Needs to hold glipizide when she starts the liquid diet to avoid hypoglycemia - DG Chest 2 View; Future - COMPLETE METABOLIC PANEL WITH GFR - CBC with Differential/Platelet  10. Dysplastic colon polyp  Needs to have cole

## 2018-03-29 NOTE — Patient Instructions (Signed)
Hold glipizide when you start liquid diet for the surgery

## 2018-03-30 LAB — COMPLETE METABOLIC PANEL WITH GFR
AG Ratio: 1.6 (calc) (ref 1.0–2.5)
ALT: 15 U/L (ref 6–29)
AST: 17 U/L (ref 10–35)
Albumin: 4.5 g/dL (ref 3.6–5.1)
Alkaline phosphatase (APISO): 98 U/L (ref 33–130)
BUN: 22 mg/dL (ref 7–25)
CO2: 25 mmol/L (ref 20–32)
Calcium: 10 mg/dL (ref 8.6–10.4)
Chloride: 104 mmol/L (ref 98–110)
Creat: 0.93 mg/dL (ref 0.60–0.93)
GFR, Est African American: 72 mL/min/{1.73_m2} (ref >=60)
GFR, Est Non African American: 62 mL/min/{1.73_m2} (ref >=60)
Globulin: 2.8 g/dL (calc) (ref 1.9–3.7)
Glucose, Bld: 124 mg/dL (ref 65–139)
Potassium: 4.5 mmol/L (ref 3.5–5.3)
Sodium: 139 mmol/L (ref 135–146)
Total Bilirubin: 0.3 mg/dL (ref 0.2–1.2)
Total Protein: 7.3 g/dL (ref 6.1–8.1)

## 2018-04-06 ENCOUNTER — Ambulatory Visit: Payer: Self-pay | Admitting: Surgery

## 2018-04-06 NOTE — H&P (Signed)
KG:[id number] by Dr. Vicente Griffin for evaluation for surgery-endoscopically unresectable polyp at hepatic flexure  HPI: Cindy Griffin is a very pleasant 72 year old female, Jehovah's Witness, with a history of hypertension, diabetes, hyperlipidemia referred to our office for evaluation for surgery. she underwent a colonoscopy 3 years ago with Dr. Candace Griffin - She had approximately 6 polyps removed from the colon - all of which came back as tubular adenomas. She was told she needed to have a repeat colonoscopy in 3 years for which she followed up for. Dr. Vicente Griffin took her for a colonoscopy on 03/13/18 findings significan for: -nonbleeding internal hemorrhoids -Diverticulosis in the sigmoid colon -Polyps: 1. cecum - TA 2. cecum - TA 3. polypoid lesion at hepatic flexure, biopsied, 2 cm in size, left in situ-at least high-grade dysplasia arising in a tubular adenoma with ulceration. Area tattooed 4. Descending colon polyp-fecal debris only  [id number] Witness, hypertension (well controlled on oral antihypertensive), diabetes mellitus (well-controlled on oral hypoglycemics), hyperlipidemia (well-controlled on statin)  PSH: abdominal hysterectomy remotely via Pfannenstiel incision for possible malignancy in her uterus, denies history of radiation. Multiple orthopedic surgeries-cervical spine fusion, rotator cuff, multiple knee surgeries bilaterally, bunion on foot  FHx: maternal side and with pancreatic cancer. She denies any known family history of colon or rectal cancer  Social: Denies use of tobacco/EtOH/drugs  ROS: A comprehensive 10 system review of systems was completed with the patient and pertinent findings as noted above.   Problem List/Past Medical Acid Reflux / GERD  Arthritis  Back Pain  Hemorrhoids  High blood pressure  High Cholesterol  Diabetes   Past Surgical History Breast Biopsy - Right  Foot Surgery - Left  Total Knee Replacement - Both  Arthroscopic Shoulder Surgery - Left   Colon Polyp Removal - Colonoscopy  Neck Surgery   Allergies No Known Drug Allergies [03/23/2018]: Allergies Reconciled   Medication History TraMADol HCl (50MG  Tablet, Oral) Active. Atorvastatin Calcium (80MG  Tablet, Oral) Active. Celecoxib (200MG  Capsule, Oral) Active. GlipiZIDE XL (10MG  Tablet ER 24HR, Oral) Active. HydroCHLOROthiazide (25MG  Tablet, Oral) Active. Lisinopril (40MG  Tablet, Oral) Active. Pioglitazone HCl (15MG  Tablet, Oral) Active. MetFORMIN HCl ER (750MG  Tablet ER 24HR, Oral) Active. ProAir HFA (108 (90 Base)MCG/ACT Aerosol Soln, Inhalation) Active. RaNITidine HCl (150MG  Tablet, Oral) Active. Medications Reconciled  Social History Cindy Griffin, [place]; 03/23/2018 3:28 PM) Alcohol use  Moderate alcohol use. Current tobacco use  Never smoker. Caffeine use  Coffee. No drug use   Family History  Arthritis  Mother. Diabetes Mellitus  Mother. High Blood Pressure / Hypertension  Mother, Brother. Kidney Disease  Mother, Sister.  Pregnancy / Birth History H/O hysterectomy  Pregnancies ([place])  4. Deliveries ([facility])  4. Mammogram  Pap smear  1-5 years ago.    Review of Systems General Not Present- Chills and Fever. Skin Not Present- Bruising and Dryness. HEENT Not Present- Blurred Vision and Headache. Neck Not Present- Neck Swelling and Swollen Glands. Respiratory Present- Chronic Cough. Not Present- Decreased Exercise Tolerance. Cardiovascular Not Present- Chest Pain and Difficulty Breathing Lying Down. Gastrointestinal Not Present- Abdominal Pain, Nausea and Vomiting. Musculoskeletal Not Present- Decreased Range of Motion and Muscle Atrophy. Neurological Not Present- Decreased Memory and Difficulty Speaking. Psychiatric Not Present- Anxiety and Depression. Endocrine Not Present- Appetite Changes and Cold Intolerance. Hematology Not Present- Abnormal Bleeding and Blood Clots.  Vitals  Weight: 237.4 lb Height:  64in Body Surface Area: 2.1 m Body Mass Index: 40.75 kg/m  Temp.: 98.25F  Pulse: 89 (Regular)  BP: 135/82 (Sitting, Left Arm, Standard)   Physical  Exam Constitutional: No acute distress; conversant; no deformities Eyes: Moist conjunctiva; no lid lag; anicteric sclerae; pupils equal round and reactive to light Neck: Trachea midline; no palpable thyromegaly Lungs: Normal respiratory effort; no tactile fremitus CV: Regular rate and rhythm; no palpable thrill; no pitting edema GI: Abdomen obese, soft, nontender, nondistended; no palpable hepatosplenomegaly. Scar c/w prior Pfannenstiel incision. No other abdominal scars noted MSK: Normal gait; no clubbing/cyanosis Psychiatric: Appropriate affect; alert and oriented 3 Lymphatic: No palpable cervical or axillary lymphadenopathy    Assessment & Plan   Impression: Cindy Griffin is a very pleasant 72 year old female with history of hypertension, diabetes, hyperlipidemia referred to our office for evaluation of an endoscopically unresectable polyp that was tattooed at the hepatic flexure. Biopsy showed at least high-grade dysplasia.  -We discussed at length the anatomy and physiology of the GI tract. We discussed the pathophysiology of colon polyps and colon cancer with associated pictures. We discussed her particular biopsy results and the fact that this has been deemed endoscopically unresectable and may contain cancer. Given this, I recommended laparoscopic right hemicolectomy.  -We will obtain medical clearance from her primary care physician prior to scheduling given her history of dyspnea on exertion and chronic bronchitis  -The planned procedure and possibility of conversion to open approach, material risks (including, but not limited to, pain, bleeding, infection, scarring, damage to surrounding structures- blood vessels/nerves/viscus/organs, damage to ureter, urine leak, leak from anastomosis, need for additional procedures, need  for stoma which may be permanent, hernia, recurrence, pneumonia, heart attack, stroke, death) benefits and alternatives to surgery were discussed at length. 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 We discussed that should major bleeding occurr, which certainly is possible with any abdominal operation, a blood transfusion may be necessary to save her life. She said that should this be the scenario occur, she would elect to not receive any blood products and accepts the risk that this could lead to death. The patient's questions were answered to her satisfaction, she voiced understanding and elected to proceed with surgery. Additionally, we discussed typical postoperative expectations and the recovery process

## 2018-04-12 ENCOUNTER — Other Ambulatory Visit: Payer: Self-pay | Admitting: Family Medicine

## 2018-04-12 DIAGNOSIS — J411 Mucopurulent chronic bronchitis: Secondary | ICD-10-CM

## 2018-04-28 NOTE — Patient Instructions (Addendum)
Cindy Griffin  04/28/2018   Your procedure is scheduled on: 05/11/2018   Report to [facility] Main  Entrance  Report to admitting at    1000 AM    Call this number if you have problems the morning of surgery [phone]   Remember: Do not eat food or drink liquids :After Midnight.            FOLLOW BOWEL PREP INSTRUCTIONS PER MD.           DRINK PLENTY OF CLEAR LIQUIDS ON DAY OF BOWEL PRP TO PREVENT HYDRATION.              FOLLOW ERAS CLASS INSTRUCTIONS.       CLEAR LIQUID DIET   Foods Allowed                                                                     Foods Excluded  Coffee and tea, regular and decaf                             liquids that you cannot  Plain Jell-O in any flavor                                             see through such as: Fruit ices (not with fruit pulp)                                     milk, soups, orange juice  Iced Popsicles                                    All solid food Carbonated beverages, regular and diet                                    Cranberry, grape and apple juices Sports drinks like Gatorade Lightly seasoned clear broth or consume(fat free) Sugar, honey syrup  Sample Menu Breakfast                                Lunch                                     Supper Cranberry juice                    Beef broth                            Chicken broth Jell-O  Grape juice                           Apple juice Coffee or tea                        Jell-O                                      Popsicle                                                Coffee or tea                        Coffee or tea  _____________________________________________________________________     Take these medicines the morning of surgery with A SIP OF WATER: Ranitidine   Use inhaler per normal routine   DO NOT TAKE ANY DIABETIC MEDICATIONS DAY OF YOUR SURGERY                               You  may not have any metal on your body including hair pins and              piercings  Do not wear jewelry, make-up, lotions, powders or perfumes, deodorant             Do not wear nail polish.  Do not shave  48 hours prior to surgery.              Do not bring valuables to the hospital. [place].  Contacts, dentures or bridgework may not be worn into surgery.  Leave suitcase in the car. After surgery it may be brought to your room.              Please read over the following fact sheets you were given: _____________________________________________________________________             [facility] - Preparing for Surgery Before surgery, you can play an important role.  Because skin is not sterile, your skin needs to be as free of germs as possible.  You can reduce the number of germs on your skin by washing with CHG (chlorahexidine gluconate) soap before surgery.  CHG is an antiseptic cleaner which kills germs and bonds with the skin to continue killing germs even after washing. Please DO NOT use if you have an allergy to CHG or antibacterial soaps.  If your skin becomes reddened/irritated stop using the CHG and inform your nurse when you arrive at Short Stay. Do not shave (including legs and underarms) for at least 48 hours prior to the first CHG shower.  You may shave your face/neck. Please follow these instructions carefully:  1.  Shower with CHG Soap the night before surgery and the  morning of Surgery.  2.  If you choose to wash your hair, wash your hair first as usual with your  normal  shampoo.  3.  After you shampoo, rinse your hair and body thoroughly to remove the  shampoo.  4.  Use CHG as you would any other liquid soap.  You can apply chg directly  to the skin and wash                       Gently with a scrungie or clean washcloth.  5.  Apply the CHG Soap to your body ONLY FROM THE NECK DOWN.   Do not use on  face/ open                           Wound or open sores. Avoid contact with eyes, ears mouth and genitals (private parts).                       Wash face,  Genitals (private parts) with your normal soap.             6.  Wash thoroughly, paying special attention to the area where your surgery  will be performed.  7.  Thoroughly rinse your body with warm water from the neck down.  8.  DO NOT shower/wash with your normal soap after using and rinsing off  the CHG Soap.                9.  Pat yourself dry with a clean towel.            10.  Wear clean pajamas.            11.  Place clean sheets on your bed the night of your first shower and do not  sleep with pets. Day of Surgery : Do not apply any lotions/deodorants the morning of surgery.  Please wear clean clothes to the hospital/surgery center.  FAILURE TO FOLLOW THESE INSTRUCTIONS MAY RESULT IN THE CANCELLATION OF YOUR SURGERY PATIENT SIGNATURE_________________________________  NURSE SIGNATURE__________________________________  ________________________________________________________________________  WHAT IS A BLOOD TRANSFUSION? Blood Transfusion Information  A transfusion is the replacement of blood or some of its parts. Blood is made up of multiple cells which provide different functions.  Red blood cells carry oxygen and are used for blood loss replacement.  White blood cells fight against infection.  Platelets control bleeding.  Plasma helps clot blood.  Other blood products are available for specialized needs, such as hemophilia or other clotting disorders. BEFORE THE TRANSFUSION  Who gives blood for transfusions?   Healthy volunteers who are fully evaluated to make sure their blood is safe. This is blood bank blood. Transfusion therapy is the safest it has ever been in the practice of medicine. Before blood is taken from a donor, a complete history is taken to make sure that person has no history of diseases nor engages in  risky social behavior (examples are intravenous drug use or sexual activity with multiple partners). The donor's travel history is screened to minimize risk of transmitting infections, such as malaria. The donated blood is tested for signs of infectious diseases, such as HIV and hepatitis. The blood is then tested to be sure it is compatible with you in order to minimize the chance of a transfusion reaction. If you or a relative donates blood, this is often done in anticipation of surgery and is not appropriate for emergency situations. It takes many days to process the donated blood. RISKS AND COMPLICATIONS Although transfusion therapy is very safe and saves many lives, the main dangers of transfusion include:   Getting an infectious disease.  Developing a transfusion reaction.  This is an allergic reaction to something in the blood you were given. Every precaution is taken to prevent this. The decision to have a blood transfusion has been considered carefully by your caregiver before blood is given. Blood is not given unless the benefits outweigh the risks. AFTER THE TRANSFUSION  Right after receiving a blood transfusion, you will usually feel much better and more energetic. This is especially true if your red blood cells have gotten low (anemic). The transfusion raises the level of the red blood cells which carry oxygen, and this usually causes an energy increase.  The nurse administering the transfusion will monitor you carefully for complications. HOME CARE INSTRUCTIONS  No special instructions are needed after a transfusion. You may find your energy is better. Speak with your caregiver about any limitations on activity for underlying diseases you may have. SEEK MEDICAL CARE IF:   Your condition is not improving after your transfusion.  You develop redness or irritation at the intravenous (IV) site. SEEK IMMEDIATE MEDICAL CARE IF:  Any of the following symptoms occur over the next 12  hours:  Shaking chills.  You have a temperature by mouth above 102 F (38.9 C), not controlled by medicine.  Chest, back, or muscle pain.  People around you feel you are not acting correctly or are confused.  Shortness of breath or difficulty breathing.  Dizziness and fainting.  You get a rash or develop hives.  You have a decrease in urine output.  Your urine turns a dark color or changes to pink, red, or brown. Any of the following symptoms occur over the next 10 days:  You have a temperature by mouth above 102 F (38.9 C), not controlled by medicine.  Shortness of breath.  Weakness after normal activity.  The white part of the eye turns yellow (jaundice).  You have a decrease in the amount of urine or are urinating less often.  Your urine turns a dark color or changes to pink, red, or brown. Document Released: 11/12/2000 Document Revised: 02/07/2012 Document Reviewed: 07/01/2008 ExitCare Patient Information 2014 [place].  _______________________________________________________________________  Incentive Spirometer  An incentive spirometer is a tool that can help keep your lungs clear and active. This tool measures how well you are filling your lungs with each breath. Taking long deep breaths may help reverse or decrease the chance of developing breathing (pulmonary) problems (especially infection) following:  A long period of time when you are unable to move or be active. BEFORE THE PROCEDURE   If the spirometer includes an indicator to show your best effort, your nurse or respiratory therapist will set it to a desired goal.  If possible, sit up straight or lean slightly forward. Try not to slouch.  Hold the incentive spirometer in an upright position. INSTRUCTIONS FOR USE  1. Sit on the edge of your bed if possible, or sit up as far as you can in bed or on a chair. 2. Hold the incentive spirometer in an upright position. 3. Breathe out  normally. 4. Place the mouthpiece in your mouth and seal your lips tightly around it. 5. Breathe in slowly and as deeply as possible, raising the piston or the ball toward the top of the column. 6. Hold your breath for 3-5 seconds or for as long as possible. Allow the piston or ball to fall to the bottom of the column. 7. Remove the mouthpiece from your mouth and breathe out normally. 8. Rest for a few seconds and repeat Steps  1 through 7 at least 10 times every 1-2 hours when you are awake. Take your time and take a few normal breaths between deep breaths. 9. The spirometer may include an indicator to show your best effort. Use the indicator as a goal to work toward during each repetition. 10. After each set of 10 deep breaths, practice coughing to be sure your lungs are clear. If you have an incision (the cut made at the time of surgery), support your incision when coughing by placing a pillow or rolled up towels firmly against it. Once you are able to get out of bed, walk around indoors and cough well. You may stop using the incentive spirometer when instructed by your caregiver.  RISKS AND COMPLICATIONS  Take your time so you do not get dizzy or light-headed.  If you are in pain, you may need to take or ask for pain medication before doing incentive spirometry. It is harder to take a deep breath if you are having pain. AFTER USE  Rest and breathe slowly and easily.  It can be helpful to keep track of a log of your progress. Your caregiver can provide you with a simple table to help with this. If you are using the spirometer at home, follow these instructions: [place] IF:   You are having difficultly using the spirometer.  You have trouble using the spirometer as often as instructed.  Your pain medication is not giving enough relief while using the spirometer.  You develop fever of 100.5 F (38.1 C) or higher. SEEK IMMEDIATE MEDICAL CARE IF:   You cough up bloody sputum  that had not been present before.  You develop fever of 102 F (38.9 C) or greater.  You develop worsening pain at or near the incision site. MAKE SURE YOU:   Understand these instructions.  Will watch your condition.  Will get help right away if you are not doing well or get worse. Document Released: 03/28/2007 Document Revised: 02/07/2012 Document Reviewed: 05/29/2007 ExitCare Patient Information 2014 ExitCare, [place].   ________________________________________________________________________ How to Manage Your Diabetes Before and After Surgery  Why is it important to control my blood sugar before and after surgery? . Improving blood sugar levels before and after surgery helps healing and can limit problems. . A way of improving blood sugar control is eating a healthy diet by: o  Eating less sugar and carbohydrates o  Increasing activity/exercise o  Talking with your doctor about reaching your blood sugar goals . High blood sugars (greater than 180 mg/dL) can raise your risk of infections and slow your recovery, so you will need to focus on controlling your diabetes during the weeks before surgery. . Make sure that the doctor who takes care of your diabetes knows about your planned surgery including the date and location.  How do I manage my blood sugar before surgery? . Check your blood sugar at least 4 times a day, starting 2 days before surgery, to make sure that the level is not too high or low. o Check your blood sugar the morning of your surgery when you wake up and every 2 hours until you get to the Short Stay unit. . If your blood sugar is less than 70 mg/dL, you will need to treat for low blood sugar: o Do not take insulin. o Treat a low blood sugar (less than 70 mg/dL) with  cup of clear juice (cranberry or apple), 4 glucose tablets, OR glucose gel. o Recheck blood  sugar in 15 minutes after treatment (to make sure it is greater than 70 mg/dL). If your blood sugar is  not greater than 70 mg/dL on recheck, call [phone] for further instructions. . Report your blood sugar to the short stay nurse when you get to Short Stay.  . If you are admitted to the hospital after surgery: o Your blood sugar will be checked by the staff and you will probably be given insulin after surgery (instead of oral diabetes medicines) to make sure you have good blood sugar levels. o The goal for blood sugar control after surgery is 80-180 mg/dL.   WHAT DO I DO ABOUT MY DIABETES MEDICATION?  [name] Do not take oral diabetes medicines (pills) the morning of surgery.  . THE NIGHT BEFORE SURGERY, take     units of       insulin.       . THE MORNING OF SURGERY, take   units of         insulin.  . The day of surgery, do not take other diabetes injectables, including Byetta (exenatide), Bydureon (exenatide ER), Victoza (liraglutide), or Trulicity (dulaglutide).  . If your CBG is greater than 220 mg/dL, you may take  of your sliding scale  . (correction) dose of insulin.    For patients with insulin pumps: Contact your diabetes doctor for specific instructions before surgery. Decrease basal rates by 20% at midnight the night before your surgery. Note that if your surgery is planned to be longer than 2 hours, your insulin pump will be removed and intravenous (IV) insulin will be started and managed by the nurses and the anesthesiologist. You will be able to restart your insulin pump once you are awake and able to manage it.  Make sure to bring insulin pump supplies to the hospital with you in case the  site needs to be changed.  Patient Signature:  Date:   Nurse Signature:  Date:   Reviewed and Endorsed by [facility] Patient Education Committee, August 2015

## 2018-05-01 ENCOUNTER — Other Ambulatory Visit: Payer: Self-pay | Admitting: Family Medicine

## 2018-05-01 DIAGNOSIS — M153 Secondary multiple arthritis: Secondary | ICD-10-CM

## 2018-05-08 ENCOUNTER — Other Ambulatory Visit: Payer: Self-pay

## 2018-05-08 ENCOUNTER — Encounter (HOSPITAL_COMMUNITY)
Admission: RE | Admit: 2018-05-08 | Discharge: 2018-05-08 | Disposition: A | Discharge status: Home / Self Care | Payer: PPO | Source: Ambulatory Visit | Attending: Surgery | Admitting: Surgery

## 2018-05-08 ENCOUNTER — Encounter (HOSPITAL_COMMUNITY): Payer: Self-pay

## 2018-05-08 DIAGNOSIS — Z7951 Long term (current) use of inhaled steroids: Secondary | ICD-10-CM | POA: Diagnosis not present

## 2018-05-08 DIAGNOSIS — E119 Type 2 diabetes mellitus without complications: Secondary | ICD-10-CM | POA: Diagnosis present

## 2018-05-08 DIAGNOSIS — K660 Peritoneal adhesions (postprocedural) (postinfection): Secondary | ICD-10-CM | POA: Diagnosis present

## 2018-05-08 DIAGNOSIS — Z79899 Other long term (current) drug therapy: Secondary | ICD-10-CM | POA: Diagnosis not present

## 2018-05-08 DIAGNOSIS — Z7982 Long term (current) use of aspirin: Secondary | ICD-10-CM | POA: Diagnosis not present

## 2018-05-08 DIAGNOSIS — K648 Other hemorrhoids: Secondary | ICD-10-CM | POA: Diagnosis present

## 2018-05-08 DIAGNOSIS — C182 Malignant neoplasm of ascending colon: Secondary | ICD-10-CM | POA: Diagnosis present

## 2018-05-08 DIAGNOSIS — M21611 Bunion of right foot: Secondary | ICD-10-CM | POA: Diagnosis present

## 2018-05-08 DIAGNOSIS — Z833 Family history of diabetes mellitus: Secondary | ICD-10-CM | POA: Diagnosis not present

## 2018-05-08 DIAGNOSIS — Z90710 Acquired absence of both cervix and uterus: Secondary | ICD-10-CM | POA: Diagnosis not present

## 2018-05-08 DIAGNOSIS — Z8249 Family history of ischemic heart disease and other diseases of the circulatory system: Secondary | ICD-10-CM | POA: Diagnosis not present

## 2018-05-08 DIAGNOSIS — K635 Polyp of colon: Secondary | ICD-10-CM | POA: Diagnosis present

## 2018-05-08 DIAGNOSIS — M48061 Spinal stenosis, lumbar region without neurogenic claudication: Secondary | ICD-10-CM | POA: Diagnosis not present

## 2018-05-08 DIAGNOSIS — K5730 Diverticulosis of large intestine without perforation or abscess without bleeding: Secondary | ICD-10-CM | POA: Diagnosis present

## 2018-05-08 DIAGNOSIS — Z01812 Encounter for preprocedural laboratory examination: Secondary | ICD-10-CM | POA: Insufficient documentation

## 2018-05-08 DIAGNOSIS — J449 Chronic obstructive pulmonary disease, unspecified: Secondary | ICD-10-CM | POA: Diagnosis present

## 2018-05-08 DIAGNOSIS — E559 Vitamin D deficiency, unspecified: Secondary | ICD-10-CM | POA: Diagnosis present

## 2018-05-08 DIAGNOSIS — Z7984 Long term (current) use of oral hypoglycemic drugs: Secondary | ICD-10-CM | POA: Diagnosis not present

## 2018-05-08 DIAGNOSIS — I10 Essential (primary) hypertension: Secondary | ICD-10-CM | POA: Diagnosis present

## 2018-05-08 DIAGNOSIS — E785 Hyperlipidemia, unspecified: Secondary | ICD-10-CM | POA: Diagnosis present

## 2018-05-08 DIAGNOSIS — Z6841 Body Mass Index (BMI) 40.0 and over, adult: Secondary | ICD-10-CM | POA: Diagnosis not present

## 2018-05-08 DIAGNOSIS — K219 Gastro-esophageal reflux disease without esophagitis: Secondary | ICD-10-CM | POA: Diagnosis present

## 2018-05-08 DIAGNOSIS — C189 Malignant neoplasm of colon, unspecified: Secondary | ICD-10-CM | POA: Diagnosis not present

## 2018-05-08 DIAGNOSIS — M1990 Unspecified osteoarthritis, unspecified site: Secondary | ICD-10-CM | POA: Diagnosis present

## 2018-05-08 DIAGNOSIS — G8929 Other chronic pain: Secondary | ICD-10-CM | POA: Diagnosis present

## 2018-05-08 DIAGNOSIS — Z825 Family history of asthma and other chronic lower respiratory diseases: Secondary | ICD-10-CM | POA: Diagnosis not present

## 2018-05-08 DIAGNOSIS — Z8542 Personal history of malignant neoplasm of other parts of uterus: Secondary | ICD-10-CM | POA: Diagnosis not present

## 2018-05-08 HISTORY — DX: Syncope and collapse: R55

## 2018-05-08 HISTORY — DX: Chronic obstructive pulmonary disease, unspecified: J44.9

## 2018-05-08 HISTORY — DX: Unspecified osteoarthritis, unspecified site: M19.90

## 2018-05-08 HISTORY — DX: Anemia, unspecified: D64.9

## 2018-05-08 LAB — CBC WITH DIFFERENTIAL/PLATELET
Basophils Absolute: 0 10*3/uL (ref 0.0–0.1)
Basophils Relative: 1 %
Eosinophils Absolute: 0.2 10*3/uL (ref 0.0–0.7)
Eosinophils Relative: 3 %
HCT: 37.2 % (ref 36.0–46.0)
Hemoglobin: 12 g/dL (ref 12.0–15.0)
Lymphocytes Relative: 28 %
Lymphs Abs: 2.2 10*3/uL (ref 0.7–4.0)
MCH: 28.5 pg (ref 26.0–34.0)
MCHC: 32.3 g/dL (ref 30.0–36.0)
MCV: 88.4 fL (ref 78.0–100.0)
Monocytes Absolute: 0.5 10*3/uL (ref 0.1–1.0)
Monocytes Relative: 6 %
Neutro Abs: 4.8 10*3/uL (ref 1.7–7.7)
Neutrophils Relative %: 62 %
Platelets: 292 10*3/uL (ref 150–400)
RBC: 4.21 MIL/uL (ref 3.87–5.11)
RDW: 14.9 % (ref 11.5–15.5)
WBC: 7.7 10*3/uL (ref 4.0–10.5)

## 2018-05-08 LAB — COMPREHENSIVE METABOLIC PANEL
ALT: 18 U/L (ref 14–54)
AST: 18 U/L (ref 15–41)
Albumin: 4.2 g/dL (ref 3.5–5.0)
Alkaline Phosphatase: 74 U/L (ref 38–126)
Anion gap: 10 (ref 5–15)
BUN: 27 mg/dL — ABNORMAL HIGH (ref 6–20)
CO2: 25 mmol/L (ref 22–32)
Calcium: 9.7 mg/dL (ref 8.9–10.3)
Chloride: 105 mmol/L (ref 101–111)
Creatinine, Ser: 0.9 mg/dL (ref 0.44–1.00)
GFR calc Af Amer: >60 mL/min (ref >60)
GFR calc non Af Amer: >60 mL/min (ref >60)
Glucose, Bld: 167 mg/dL — ABNORMAL HIGH (ref 65–99)
Potassium: 4.2 mmol/L (ref 3.5–5.1)
Sodium: 140 mmol/L (ref 135–145)
Total Bilirubin: 0.7 mg/dL (ref 0.3–1.2)
Total Protein: 7.4 g/dL (ref 6.5–8.1)

## 2018-05-08 LAB — HEMOGLOBIN A1C
Hgb A1c MFr Bld: 7.9 % — ABNORMAL HIGH (ref 4.8–5.6)
Mean Plasma Glucose: 180.03 mg/dL

## 2018-05-08 LAB — APTT: aPTT: 30 seconds (ref 24–36)

## 2018-05-08 LAB — PROTIME-INR
INR: 0.87
Prothrombin Time: 11.8 seconds (ref 11.4–15.2)

## 2018-05-08 LAB — GLUCOSE, CAPILLARY: Glucose-Capillary: 237 mg/dL — ABNORMAL HIGH (ref 65–99)

## 2018-05-08 NOTE — Pre-Procedure Instructions (Signed)
CMP results 05/08/2018 faxed to Dr. Dema Severin via epic.

## 2018-05-09 NOTE — Pre-Procedure Instructions (Signed)
Hgb A1C  Results 05/08/2018 faxed to Dr. Dema Severin via epic.

## 2018-05-09 NOTE — Pre-Procedure Instructions (Signed)
Dr. Kalman Shan made aware of elevated BUN results 05/08/2018 no new orders received at this time.

## 2018-05-11 ENCOUNTER — Inpatient Hospital Stay (HOSPITAL_COMMUNITY)
Admission: RE | Admit: 2018-05-11 | Discharge: 2018-05-15 | DRG: 330 | Disposition: A | Discharge status: Home / Self Care | Payer: PPO | Attending: Surgery | Admitting: Surgery

## 2018-05-11 ENCOUNTER — Inpatient Hospital Stay (HOSPITAL_COMMUNITY): Payer: PPO | Admitting: Certified Registered"

## 2018-05-11 ENCOUNTER — Encounter (HOSPITAL_COMMUNITY)
Admission: RE | Disposition: A | Discharge status: Home / Self Care | Payer: Self-pay | Source: Ambulatory Visit | Attending: Surgery

## 2018-05-11 ENCOUNTER — Other Ambulatory Visit: Payer: Self-pay

## 2018-05-11 ENCOUNTER — Encounter (HOSPITAL_COMMUNITY): Payer: Self-pay | Admitting: Certified Registered"

## 2018-05-11 DIAGNOSIS — M21611 Bunion of right foot: Secondary | ICD-10-CM | POA: Diagnosis present

## 2018-05-11 DIAGNOSIS — E785 Hyperlipidemia, unspecified: Secondary | ICD-10-CM | POA: Diagnosis present

## 2018-05-11 DIAGNOSIS — G8929 Other chronic pain: Secondary | ICD-10-CM | POA: Diagnosis present

## 2018-05-11 DIAGNOSIS — Z7951 Long term (current) use of inhaled steroids: Secondary | ICD-10-CM

## 2018-05-11 DIAGNOSIS — K635 Polyp of colon: Secondary | ICD-10-CM | POA: Diagnosis present

## 2018-05-11 DIAGNOSIS — M1990 Unspecified osteoarthritis, unspecified site: Secondary | ICD-10-CM | POA: Diagnosis present

## 2018-05-11 DIAGNOSIS — I10 Essential (primary) hypertension: Secondary | ICD-10-CM | POA: Diagnosis present

## 2018-05-11 DIAGNOSIS — E119 Type 2 diabetes mellitus without complications: Secondary | ICD-10-CM | POA: Diagnosis present

## 2018-05-11 DIAGNOSIS — Z7984 Long term (current) use of oral hypoglycemic drugs: Secondary | ICD-10-CM

## 2018-05-11 DIAGNOSIS — Z825 Family history of asthma and other chronic lower respiratory diseases: Secondary | ICD-10-CM | POA: Diagnosis not present

## 2018-05-11 DIAGNOSIS — Z8249 Family history of ischemic heart disease and other diseases of the circulatory system: Secondary | ICD-10-CM

## 2018-05-11 DIAGNOSIS — K648 Other hemorrhoids: Secondary | ICD-10-CM | POA: Diagnosis present

## 2018-05-11 DIAGNOSIS — Z8542 Personal history of malignant neoplasm of other parts of uterus: Secondary | ICD-10-CM | POA: Diagnosis not present

## 2018-05-11 DIAGNOSIS — E559 Vitamin D deficiency, unspecified: Secondary | ICD-10-CM | POA: Diagnosis present

## 2018-05-11 DIAGNOSIS — Z6841 Body Mass Index (BMI) 40.0 and over, adult: Secondary | ICD-10-CM

## 2018-05-11 DIAGNOSIS — J449 Chronic obstructive pulmonary disease, unspecified: Secondary | ICD-10-CM | POA: Diagnosis present

## 2018-05-11 DIAGNOSIS — K5730 Diverticulosis of large intestine without perforation or abscess without bleeding: Secondary | ICD-10-CM | POA: Diagnosis present

## 2018-05-11 DIAGNOSIS — K219 Gastro-esophageal reflux disease without esophagitis: Secondary | ICD-10-CM | POA: Diagnosis present

## 2018-05-11 DIAGNOSIS — Z79899 Other long term (current) drug therapy: Secondary | ICD-10-CM

## 2018-05-11 DIAGNOSIS — K660 Peritoneal adhesions (postprocedural) (postinfection): Secondary | ICD-10-CM | POA: Diagnosis present

## 2018-05-11 DIAGNOSIS — Z833 Family history of diabetes mellitus: Secondary | ICD-10-CM

## 2018-05-11 DIAGNOSIS — Z90710 Acquired absence of both cervix and uterus: Secondary | ICD-10-CM

## 2018-05-11 DIAGNOSIS — C182 Malignant neoplasm of ascending colon: Principal | ICD-10-CM | POA: Diagnosis present

## 2018-05-11 DIAGNOSIS — Z7982 Long term (current) use of aspirin: Secondary | ICD-10-CM | POA: Diagnosis not present

## 2018-05-11 HISTORY — PX: LAPAROSCOPIC RIGHT COLECTOMY: SHX5925

## 2018-05-11 LAB — GLUCOSE, CAPILLARY
Glucose-Capillary: 302 mg/dL — ABNORMAL HIGH (ref 65–99)
Glucose-Capillary: 117 mg/dL — ABNORMAL HIGH (ref 65–99)
Glucose-Capillary: 184 mg/dL — ABNORMAL HIGH (ref 65–99)
Glucose-Capillary: 81 mg/dL (ref 65–99)
Glucose-Capillary: 148 mg/dL — ABNORMAL HIGH (ref 65–99)
Glucose-Capillary: 175 mg/dL — ABNORMAL HIGH (ref 65–99)
Glucose-Capillary: 190 mg/dL — ABNORMAL HIGH (ref 65–99)
Glucose-Capillary: 207 mg/dL — ABNORMAL HIGH (ref 65–99)
Glucose-Capillary: 168 mg/dL — ABNORMAL HIGH (ref 65–99)

## 2018-05-11 SURGERY — COLECTOMY, RIGHT, LAPAROSCOPIC
Anesthesia: General | Site: Abdomen | Laterality: Right

## 2018-05-11 MED ORDER — TRAZODONE HCL 50 MG PO TABS: 25.0000 mg | ORAL_TABLET | Freq: Every evening | ORAL | Status: DC | PRN

## 2018-05-11 MED ORDER — IBUPROFEN 200 MG PO TABS
600.0000 mg | ORAL_TABLET | Freq: Four times a day (QID) | ORAL | Status: DC
  Administered 2018-05-11 – 2018-05-15 (×15): 600 mg via ORAL
  Filled 2018-05-11 (×15): qty 3

## 2018-05-11 MED ORDER — ALBUTEROL SULFATE (2.5 MG/3ML) 0.083% IN NEBU: 3.0000 mL | INHALATION_SOLUTION | Freq: Four times a day (QID) | RESPIRATORY_TRACT | Status: DC | PRN

## 2018-05-11 MED ORDER — HYDROCHLOROTHIAZIDE 25 MG PO TABS
25.0000 mg | ORAL_TABLET | Freq: Every day | ORAL | Status: DC
  Administered 2018-05-11 – 2018-05-14 (×4): 25 mg via ORAL
  Filled 2018-05-11 (×4): qty 1

## 2018-05-11 MED ORDER — ROCURONIUM BROMIDE 10 MG/ML (PF) SYRINGE
PREFILLED_SYRINGE | INTRAVENOUS | Status: DC | PRN
  Administered 2018-05-11: 20 mg via INTRAVENOUS
  Administered 2018-05-11: 5 mg via INTRAVENOUS
  Administered 2018-05-11: 10 mg via INTRAVENOUS
  Administered 2018-05-11: 5 mg via INTRAVENOUS
  Administered 2018-05-11: 50 mg via INTRAVENOUS

## 2018-05-11 MED ORDER — INSULIN ASPART 100 UNIT/ML ~~LOC~~ SOLN
0.0000 [IU] | Freq: Three times a day (TID) | SUBCUTANEOUS | Status: DC
  Administered 2018-05-11: 3 [IU] via SUBCUTANEOUS
  Administered 2018-05-12: 2 [IU] via SUBCUTANEOUS
  Administered 2018-05-12: 1 [IU] via SUBCUTANEOUS
  Administered 2018-05-12: 2 [IU] via SUBCUTANEOUS
  Administered 2018-05-13: 5 [IU] via SUBCUTANEOUS
  Administered 2018-05-13 – 2018-05-14 (×2): 2 [IU] via SUBCUTANEOUS
  Administered 2018-05-14: 1 [IU] via SUBCUTANEOUS
  Administered 2018-05-15 (×2): 2 [IU] via SUBCUTANEOUS

## 2018-05-11 MED ORDER — HYDROMORPHONE HCL 1 MG/ML IJ SOLN
0.5000 mg | INTRAMUSCULAR | Status: DC | PRN
  Administered 2018-05-11: 0.5 mg via INTRAVENOUS

## 2018-05-11 MED ORDER — GABAPENTIN 300 MG PO CAPS
300.0000 mg | ORAL_CAPSULE | ORAL | Status: AC
  Administered 2018-05-11: 300 mg via ORAL
  Filled 2018-05-11: qty 1

## 2018-05-11 MED ORDER — SUGAMMADEX SODIUM 500 MG/5ML IV SOLN
INTRAVENOUS | Status: AC
  Filled 2018-05-11: qty 5

## 2018-05-11 MED ORDER — LIDOCAINE 2% (20 MG/ML) 5 ML SYRINGE
INTRAMUSCULAR | Status: AC
  Filled 2018-05-11: qty 5

## 2018-05-11 MED ORDER — CHLORHEXIDINE GLUCONATE CLOTH 2 % EX PADS: 6.0000 | MEDICATED_PAD | Freq: Once | CUTANEOUS | Status: DC

## 2018-05-11 MED ORDER — PHENYLEPHRINE 40 MCG/ML (10ML) SYRINGE FOR IV PUSH (FOR BLOOD PRESSURE SUPPORT)
PREFILLED_SYRINGE | INTRAVENOUS | Status: DC | PRN
  Administered 2018-05-11 (×4): 80 ug via INTRAVENOUS

## 2018-05-11 MED ORDER — ONDANSETRON HCL 4 MG PO TABS: 4.0000 mg | ORAL_TABLET | Freq: Four times a day (QID) | ORAL | Status: DC | PRN

## 2018-05-11 MED ORDER — HYDROMORPHONE HCL 1 MG/ML IJ SOLN
0.2500 mg | INTRAMUSCULAR | Status: DC | PRN
  Administered 2018-05-11 (×2): 0.25 mg via INTRAVENOUS

## 2018-05-11 MED ORDER — LACTATED RINGERS IV SOLN
INTRAVENOUS | Status: DC
  Administered 2018-05-11: 1000 mL via INTRAVENOUS
  Administered 2018-05-11: 19:00:00 via INTRAVENOUS
  Administered 2018-05-12 (×2): 1000 mL via INTRAVENOUS
  Administered 2018-05-13 – 2018-05-14 (×2): via INTRAVENOUS

## 2018-05-11 MED ORDER — ACETAMINOPHEN 500 MG PO TABS
1000.0000 mg | ORAL_TABLET | Freq: Four times a day (QID) | ORAL | Status: AC
  Administered 2018-05-11 – 2018-05-12 (×4): 1000 mg via ORAL
  Filled 2018-05-11 (×4): qty 2

## 2018-05-11 MED ORDER — GLIPIZIDE ER 5 MG PO TB24
10.0000 mg | ORAL_TABLET | Freq: Every day | ORAL | Status: DC
  Administered 2018-05-12 – 2018-05-15 (×4): 10 mg via ORAL
  Filled 2018-05-11 (×5): qty 2

## 2018-05-11 MED ORDER — NEOMYCIN SULFATE 500 MG PO TABS: 1000.0000 mg | ORAL_TABLET | ORAL | Status: DC

## 2018-05-11 MED ORDER — LIDOCAINE 2% (20 MG/ML) 5 ML SYRINGE
INTRAMUSCULAR | Status: DC | PRN
  Administered 2018-05-11: 100 mg via INTRAVENOUS

## 2018-05-11 MED ORDER — PROPOFOL 10 MG/ML IV BOLUS
INTRAVENOUS | Status: DC | PRN
  Administered 2018-05-11: 180 mg via INTRAVENOUS

## 2018-05-11 MED ORDER — 0.9 % SODIUM CHLORIDE (POUR BTL) OPTIME
TOPICAL | Status: DC | PRN
  Administered 2018-05-11: 2000 mL

## 2018-05-11 MED ORDER — POLYVINYL ALCOHOL 1.4 % OP SOLN: 2.0000 [drp] | Freq: Three times a day (TID) | OPHTHALMIC | Status: DC | PRN

## 2018-05-11 MED ORDER — LIDOCAINE 20MG/ML (2%) 15 ML SYRINGE OPTIME
INTRAMUSCULAR | Status: DC | PRN
  Administered 2018-05-11: 1.5 mg/kg/h via INTRAVENOUS

## 2018-05-11 MED ORDER — ONDANSETRON HCL 4 MG/2ML IJ SOLN: 4.0000 mg | Freq: Four times a day (QID) | INTRAMUSCULAR | Status: DC | PRN

## 2018-05-11 MED ORDER — ONDANSETRON HCL 4 MG/2ML IJ SOLN
INTRAMUSCULAR | Status: DC | PRN
  Administered 2018-05-11: 4 mg via INTRAVENOUS

## 2018-05-11 MED ORDER — HEPARIN SODIUM (PORCINE) 5000 UNIT/ML IJ SOLN
5000.0000 [IU] | Freq: Three times a day (TID) | INTRAMUSCULAR | Status: DC
  Administered 2018-05-12 – 2018-05-15 (×10): 5000 [IU] via SUBCUTANEOUS
  Filled 2018-05-11 (×9): qty 1

## 2018-05-11 MED ORDER — UMECLIDINIUM-VILANTEROL 62.5-25 MCG/INH IN AEPB
1.0000 | INHALATION_SPRAY | Freq: Every day | RESPIRATORY_TRACT | Status: DC
  Administered 2018-05-12 – 2018-05-15 (×4): 1 via RESPIRATORY_TRACT
  Filled 2018-05-11: qty 14

## 2018-05-11 MED ORDER — PHENYLEPHRINE 40 MCG/ML (10ML) SYRINGE FOR IV PUSH (FOR BLOOD PRESSURE SUPPORT)
PREFILLED_SYRINGE | INTRAVENOUS | Status: AC
  Filled 2018-05-11: qty 10

## 2018-05-11 MED ORDER — PIOGLITAZONE HCL 15 MG PO TABS
15.0000 mg | ORAL_TABLET | Freq: Every day | ORAL | Status: DC
  Administered 2018-05-12 – 2018-05-15 (×4): 15 mg via ORAL
  Filled 2018-05-11 (×4): qty 1

## 2018-05-11 MED ORDER — LACTATED RINGERS IR SOLN
Status: DC | PRN
  Administered 2018-05-11: 1000 mL

## 2018-05-11 MED ORDER — METFORMIN HCL ER 750 MG PO TB24
750.0000 mg | ORAL_TABLET | Freq: Every day | ORAL | Status: DC
  Administered 2018-05-12 – 2018-05-15 (×4): 750 mg via ORAL
  Filled 2018-05-11 (×2): qty 1
  Filled 2018-05-11: qty 1.5
  Filled 2018-05-11 (×2): qty 1

## 2018-05-11 MED ORDER — KETAMINE HCL 10 MG/ML IJ SOLN
INTRAMUSCULAR | Status: DC | PRN
  Administered 2018-05-11 (×2): 25 mg via INTRAVENOUS

## 2018-05-11 MED ORDER — HYDROMORPHONE HCL 1 MG/ML IJ SOLN
INTRAMUSCULAR | Status: AC
  Administered 2018-05-11: 0.25 mg via INTRAVENOUS
  Filled 2018-05-11: qty 1

## 2018-05-11 MED ORDER — BUPIVACAINE LIPOSOME 1.3 % IJ SUSP
20.0000 mL | Freq: Once | INTRAMUSCULAR | Status: AC
Start: 2018-05-11 — End: 2018-05-11
  Administered 2018-05-11: 20 mL
  Filled 2018-05-11: qty 20

## 2018-05-11 MED ORDER — ACETAMINOPHEN 500 MG PO TABS
1000.0000 mg | ORAL_TABLET | ORAL | Status: AC
  Administered 2018-05-11: 1000 mg via ORAL
  Filled 2018-05-11: qty 2

## 2018-05-11 MED ORDER — PROPOFOL 10 MG/ML IV BOLUS
INTRAVENOUS | Status: AC
  Filled 2018-05-11: qty 20

## 2018-05-11 MED ORDER — BUPIVACAINE-EPINEPHRINE (PF) 0.25% -1:200000 IJ SOLN
INTRAMUSCULAR | Status: AC
  Filled 2018-05-11: qty 30

## 2018-05-11 MED ORDER — FENTANYL CITRATE (PF) 250 MCG/5ML IJ SOLN
INTRAMUSCULAR | Status: AC
  Filled 2018-05-11: qty 5

## 2018-05-11 MED ORDER — DIPHENHYDRAMINE HCL 50 MG/ML IJ SOLN: 12.5000 mg | Freq: Four times a day (QID) | INTRAMUSCULAR | Status: DC | PRN

## 2018-05-11 MED ORDER — MIDAZOLAM HCL 2 MG/2ML IJ SOLN
INTRAMUSCULAR | Status: AC
  Filled 2018-05-11: qty 2

## 2018-05-11 MED ORDER — ALVIMOPAN 12 MG PO CAPS
12.0000 mg | ORAL_CAPSULE | ORAL | Status: AC
  Administered 2018-05-11: 12 mg via ORAL
  Filled 2018-05-11: qty 1

## 2018-05-11 MED ORDER — FERROUS SULFATE 325 (65 FE) MG PO TABS
325.0000 mg | ORAL_TABLET | Freq: Two times a day (BID) | ORAL | Status: DC
  Administered 2018-05-12 – 2018-05-15 (×7): 325 mg via ORAL
  Filled 2018-05-11 (×7): qty 1

## 2018-05-11 MED ORDER — ONDANSETRON HCL 4 MG/2ML IJ SOLN
INTRAMUSCULAR | Status: AC
  Filled 2018-05-11: qty 2

## 2018-05-11 MED ORDER — DEXAMETHASONE SODIUM PHOSPHATE 10 MG/ML IJ SOLN
INTRAMUSCULAR | Status: DC | PRN
  Administered 2018-05-11: 4 mg via INTRAVENOUS

## 2018-05-11 MED ORDER — HYDROMORPHONE HCL 1 MG/ML IJ SOLN
INTRAMUSCULAR | Status: AC
  Filled 2018-05-11: qty 1

## 2018-05-11 MED ORDER — LISINOPRIL 20 MG PO TABS
40.0000 mg | ORAL_TABLET | Freq: Every day | ORAL | Status: DC
  Administered 2018-05-12 – 2018-05-15 (×4): 40 mg via ORAL
  Filled 2018-05-11 (×5): qty 2

## 2018-05-11 MED ORDER — ROCURONIUM BROMIDE 10 MG/ML (PF) SYRINGE
PREFILLED_SYRINGE | INTRAVENOUS | Status: AC
  Filled 2018-05-11: qty 5

## 2018-05-11 MED ORDER — METRONIDAZOLE 500 MG PO TABS: 1000.0000 mg | ORAL_TABLET | ORAL | Status: DC

## 2018-05-11 MED ORDER — LACTATED RINGERS IV SOLN
INTRAVENOUS | Status: DC
  Administered 2018-05-11 (×2): via INTRAVENOUS

## 2018-05-11 MED ORDER — POLYETHYLENE GLYCOL 3350 17 GM/SCOOP PO POWD: 1.0000 | Freq: Once | ORAL | Status: DC

## 2018-05-11 MED ORDER — ATORVASTATIN CALCIUM 40 MG PO TABS
80.0000 mg | ORAL_TABLET | Freq: Every day | ORAL | Status: DC
  Administered 2018-05-11 – 2018-05-14 (×4): 80 mg via ORAL
  Filled 2018-05-11 (×4): qty 2

## 2018-05-11 MED ORDER — ONDANSETRON HCL 4 MG/2ML IJ SOLN: 4.0000 mg | Freq: Once | INTRAMUSCULAR | Status: DC | PRN

## 2018-05-11 MED ORDER — BUPIVACAINE-EPINEPHRINE (PF) 0.25% -1:200000 IJ SOLN
INTRAMUSCULAR | Status: DC | PRN
  Administered 2018-05-11: 30 mL

## 2018-05-11 MED ORDER — DIPHENHYDRAMINE HCL 12.5 MG/5ML PO ELIX: 12.5000 mg | ORAL_SOLUTION | Freq: Four times a day (QID) | ORAL | Status: DC | PRN

## 2018-05-11 MED ORDER — SUGAMMADEX SODIUM 500 MG/5ML IV SOLN
INTRAVENOUS | Status: DC | PRN
  Administered 2018-05-11: 250 mg via INTRAVENOUS

## 2018-05-11 MED ORDER — INSULIN ASPART 100 UNIT/ML ~~LOC~~ SOLN
SUBCUTANEOUS | Status: AC
  Administered 2018-05-11: 6 [IU] via INTRAVENOUS
  Filled 2018-05-11: qty 1

## 2018-05-11 MED ORDER — TRAMADOL HCL 50 MG PO TABS
50.0000 mg | ORAL_TABLET | Freq: Four times a day (QID) | ORAL | Status: DC | PRN
  Administered 2018-05-12: 50 mg via ORAL
  Filled 2018-05-11: qty 1

## 2018-05-11 MED ORDER — FAMOTIDINE 20 MG PO TABS
20.0000 mg | ORAL_TABLET | Freq: Every day | ORAL | Status: DC
  Administered 2018-05-12 – 2018-05-15 (×4): 20 mg via ORAL
  Filled 2018-05-11 (×4): qty 1

## 2018-05-11 MED ORDER — ALUM & MAG HYDROXIDE-SIMETH 200-200-20 MG/5ML PO SUSP: 30.0000 mL | Freq: Four times a day (QID) | ORAL | Status: DC | PRN

## 2018-05-11 MED ORDER — INSULIN ASPART 100 UNIT/ML ~~LOC~~ SOLN
6.0000 [IU] | Freq: Once | SUBCUTANEOUS | Status: AC
  Administered 2018-05-11: 6 [IU] via INTRAVENOUS
  Filled 2018-05-11: qty 0.06

## 2018-05-11 MED ORDER — FENTANYL CITRATE (PF) 100 MCG/2ML IJ SOLN
INTRAMUSCULAR | Status: DC | PRN
  Administered 2018-05-11: 100 ug via INTRAVENOUS
  Administered 2018-05-11 (×3): 50 ug via INTRAVENOUS

## 2018-05-11 MED ORDER — SODIUM CHLORIDE 0.9 % IV SOLN
2.0000 g | INTRAVENOUS | Status: AC
  Administered 2018-05-11: 2 g via INTRAVENOUS
  Filled 2018-05-11: qty 2

## 2018-05-11 MED ORDER — GABAPENTIN 300 MG PO CAPS
300.0000 mg | ORAL_CAPSULE | Freq: Two times a day (BID) | ORAL | Status: DC
  Administered 2018-05-11 – 2018-05-15 (×8): 300 mg via ORAL
  Filled 2018-05-11 (×8): qty 1

## 2018-05-11 MED ORDER — TIZANIDINE HCL 4 MG PO TABS: 2.0000 mg | ORAL_TABLET | Freq: Every day | ORAL | Status: DC | PRN

## 2018-05-11 MED ORDER — HYDRALAZINE HCL 20 MG/ML IJ SOLN: 10.0000 mg | INTRAMUSCULAR | Status: DC | PRN

## 2018-05-11 MED ORDER — SACCHAROMYCES BOULARDII 250 MG PO CAPS
250.0000 mg | ORAL_CAPSULE | Freq: Two times a day (BID) | ORAL | Status: DC
Start: 2018-05-11 — End: 2018-05-15
  Administered 2018-05-11 – 2018-05-15 (×8): 250 mg via ORAL
  Filled 2018-05-11 (×8): qty 1

## 2018-05-11 MED ORDER — HEPARIN SODIUM (PORCINE) 5000 UNIT/ML IJ SOLN
5000.0000 [IU] | Freq: Once | INTRAMUSCULAR | Status: AC
  Administered 2018-05-11: 5000 [IU] via SUBCUTANEOUS
  Filled 2018-05-11: qty 1

## 2018-05-11 MED ORDER — ALVIMOPAN 12 MG PO CAPS: 12.0000 mg | ORAL_CAPSULE | Freq: Two times a day (BID) | ORAL | Status: DC

## 2018-05-11 SURGICAL SUPPLY — 77 items
ADH SKN CLS APL DERMABOND .7 (GAUZE/BANDAGES/DRESSINGS) ×1
APPLIER CLIP 5 13 M/L LIGAMAX5 (MISCELLANEOUS)
APPLIER CLIP ROT 10 11.4 M/L (STAPLE)
APR CLP MED LRG 11.4X10 (STAPLE)
APR CLP MED LRG 5 ANG JAW (MISCELLANEOUS)
BLADE EXTENDED COATED 6.5IN (ELECTRODE) IMPLANT
CABLE HIGH FREQUENCY MONO STRZ (ELECTRODE) ×1 IMPLANT
CATH MUSHROOM 28FR (CATHETERS) IMPLANT
CATH MUSHROOM 30FR (CATHETERS) IMPLANT
CELLS DAT CNTRL 66122 CELL SVR (MISCELLANEOUS) ×1 IMPLANT
CLIP APPLIE 5 13 M/L LIGAMAX5 (MISCELLANEOUS) IMPLANT
CLIP APPLIE ROT 10 11.4 M/L (STAPLE) IMPLANT
DECANTER SPIKE VIAL GLASS SM (MISCELLANEOUS) ×2 IMPLANT
DERMABOND ADVANCED (GAUZE/BANDAGES/DRESSINGS) ×1
DERMABOND ADVANCED .7 DNX12 (GAUZE/BANDAGES/DRESSINGS) IMPLANT
DISSECTOR BLUNT TIP ENDO 5MM (MISCELLANEOUS) IMPLANT
DRAIN CHANNEL 19F RND (DRAIN) IMPLANT
DRAPE SURG IRRIG POUCH 19X23 (DRAPES) ×2 IMPLANT
DRSG OPSITE POSTOP 3X4 (GAUZE/BANDAGES/DRESSINGS) ×1 IMPLANT
DRSG OPSITE POSTOP 4X10 (GAUZE/BANDAGES/DRESSINGS) IMPLANT
DRSG OPSITE POSTOP 4X6 (GAUZE/BANDAGES/DRESSINGS) IMPLANT
DRSG OPSITE POSTOP 4X8 (GAUZE/BANDAGES/DRESSINGS) IMPLANT
ELECT REM PT RETURN 15FT ADLT (MISCELLANEOUS) ×2 IMPLANT
EVACUATOR SILICONE 100CC (DRAIN) IMPLANT
GAUZE SPONGE 4X4 12PLY STRL (GAUZE/BANDAGES/DRESSINGS) IMPLANT
GLOVE BIO SURGEON STRL SZ7.5 (GLOVE) ×4 IMPLANT
GLOVE INDICATOR 8.0 STRL GRN (GLOVE) ×4 IMPLANT
GOWN STRL REUS W/TWL XL LVL3 (GOWN DISPOSABLE) ×8 IMPLANT
HOLDER FOLEY CATH W/STRAP (MISCELLANEOUS) ×2 IMPLANT
LIGASURE IMPACT 36 18CM CVD LR (INSTRUMENTS) IMPLANT
NDL INSUFFLATION 14GA 120MM (NEEDLE) IMPLANT
NEEDLE INSUFFLATION 14GA 120MM (NEEDLE) ×2 IMPLANT
PACK COLON (CUSTOM PROCEDURE TRAY) ×2 IMPLANT
PAD POSITIONING PINK XL (MISCELLANEOUS) ×2 IMPLANT
PORT LAP GEL ALEXIS MED 5-9CM (MISCELLANEOUS) IMPLANT
RELOAD PROXIMATE 75MM BLUE (ENDOMECHANICALS) ×2 IMPLANT
RELOAD STAPLE 75 3.8 BLU REG (ENDOMECHANICALS) IMPLANT
RETRACTOR WND ALEXIS 18 MED (MISCELLANEOUS) IMPLANT
RTRCTR WOUND ALEXIS 18CM MED (MISCELLANEOUS) ×2
SCISSORS LAP 5X35 DISP (ENDOMECHANICALS) ×2 IMPLANT
SEALER TISSUE G2 STRG ARTC 35C (ENDOMECHANICALS) ×2 IMPLANT
SET IRRIG TUBING LAPAROSCOPIC (IRRIGATION / IRRIGATOR) ×2 IMPLANT
SLEEVE ADV FIXATION 5X100MM (TROCAR) ×4 IMPLANT
SPONGE DRAIN TRACH 4X4 STRL 2S (GAUZE/BANDAGES/DRESSINGS) IMPLANT
STAPLER 90 3.5 STAND SLIM (STAPLE) ×2
STAPLER 90 3.5 STD SLIM (STAPLE) IMPLANT
STAPLER PROXIMATE 75MM BLUE (STAPLE) ×2 IMPLANT
STAPLER VISISTAT 35W (STAPLE) IMPLANT
SUT ETHILON 3 0 PS 1 (SUTURE) IMPLANT
SUT PDS AB 1 CT1 27 (SUTURE) ×3 IMPLANT
SUT PDS AB 1 CTX 36 (SUTURE) IMPLANT
SUT PDS AB 1 TP1 54 (SUTURE) IMPLANT
SUT PDS AB 1 TP1 96 (SUTURE) IMPLANT
SUT PROLENE 2 0 KS (SUTURE) ×2 IMPLANT
SUT PROLENE 2 0 SH DA (SUTURE) ×1 IMPLANT
SUT SILK 2 0 (SUTURE) ×2
SUT SILK 2 0 SH CR/8 (SUTURE) ×2 IMPLANT
SUT SILK 2-0 18XBRD TIE 12 (SUTURE) ×1 IMPLANT
SUT SILK 3 0 (SUTURE) ×2
SUT SILK 3 0 SH CR/8 (SUTURE) ×2 IMPLANT
SUT SILK 3-0 18XBRD TIE 12 (SUTURE) ×1 IMPLANT
SUT VIC AB 2-0 SH 27 (SUTURE)
SUT VIC AB 2-0 SH 27X BRD (SUTURE) IMPLANT
SUT VIC AB 3-0 SH 18 (SUTURE) IMPLANT
SUT VIC AB 3-0 SH 27 (SUTURE)
SUT VIC AB 3-0 SH 27X BRD (SUTURE) IMPLANT
SUT VICRYL 2 0 18  UND BR (SUTURE) ×1
SUT VICRYL 2 0 18 UND BR (SUTURE) ×1 IMPLANT
SYS LAPSCP GELPORT 120MM (MISCELLANEOUS)
SYSTEM LAPSCP GELPORT 120MM (MISCELLANEOUS) IMPLANT
TOWEL OR 17X26 10 PK STRL BLUE (TOWEL DISPOSABLE) IMPLANT
TOWEL OR NON WOVEN STRL DISP B (DISPOSABLE) ×2 IMPLANT
TRAY FOLEY MTR SLVR 14FR STAT (SET/KITS/TRAYS/PACK) ×1 IMPLANT
TRAY IRRIG W/60CC SYR STRL (SET/KITS/TRAYS/PACK) ×1 IMPLANT
TROCAR ADV FIXATION 5X100MM (TROCAR) ×2 IMPLANT
TROCAR XCEL BLUNT TIP 100MML (ENDOMECHANICALS) IMPLANT
TUBING INSUF HEATED (TUBING) ×2 IMPLANT

## 2018-05-11 NOTE — Transfer of Care (Signed)
Immediate Anesthesia Transfer of Care Note  Patient: Cindy Griffin  Procedure(s) Performed: LAPAROSCOPIC RIGHT HEMICOLECTOMY ERAS PATHWAY (Right Abdomen)  Patient Location: PACU  Anesthesia Type:General  Level of Consciousness: awake, alert  and oriented  Airway & Oxygen Therapy: Patient Spontanous Breathing and Patient connected to face mask oxygen  Post-op Assessment: Report given to RN and Post -op Vital signs reviewed and stable  Post vital signs: Reviewed and stable  Last Vitals:  Vitals Value Taken Time  BP 154/79 05/11/2018  4:08 PM  Temp 36.9 C 05/11/2018  4:08 PM  Pulse 91 05/11/2018  4:14 PM  Resp 19 05/11/2018  4:14 PM  SpO2 99 % 05/11/2018  4:14 PM  Vitals shown include unvalidated device data.  Last Pain:  Vitals:   05/11/18 0958  TempSrc: Oral      Patients Stated Pain Goal: 2 (76/22/63 3354)  Complications: No apparent anesthesia complications

## 2018-05-11 NOTE — Anesthesia Preprocedure Evaluation (Addendum)
Anesthesia Evaluation  Patient identified by MRN, date of birth, ID band Patient awake    Reviewed: Allergy & Precautions, NPO status , Patient's Chart, lab work & pertinent test results  Airway Mallampati: II  TM Distance: >3 FB Neck ROM: Full    Dental  (+) Dental Advisory Given   Pulmonary COPD,    breath sounds clear to auscultation       Cardiovascular hypertension, Pt. on medications  Rhythm:Regular Rate:Normal     Neuro/Psych  Neuromuscular disease    GI/Hepatic Neg liver ROS, GERD  ,  Endo/Other  diabetes, Type 2, Oral Hypoglycemic AgentsMorbid obesity  Renal/GU negative Renal ROS     Musculoskeletal   Abdominal   Peds  Hematology negative hematology ROS (+)   Anesthesia Other Findings   Reproductive/Obstetrics                            Lab Results  Component Value Date   WBC 7.7 05/08/2018   HGB 12.0 05/08/2018   HCT 37.2 05/08/2018   MCV 88.4 05/08/2018   PLT 292 05/08/2018   Lab Results  Component Value Date   CREATININE 0.90 05/08/2018   BUN 27 (H) 05/08/2018   NA 140 05/08/2018   K 4.2 05/08/2018   CL 105 05/08/2018   CO2 25 05/08/2018    Anesthesia Physical Anesthesia Plan  ASA: III  Anesthesia Plan: General   Post-op Pain Management:    Induction: Intravenous  PONV Risk Score and Plan: 3 and Ondansetron, Dexamethasone and Treatment may vary due to age or medical condition  Airway Management Planned: Oral ETT  Additional Equipment:   Intra-op Plan:   Post-operative Plan: Extubation in OR  Informed Consent: I have reviewed the patients History and Physical, chart, labs and discussed the procedure including the risks, benefits and alternatives for the proposed anesthesia with the patient or authorized representative who has indicated his/her understanding and acceptance.   Dental advisory given  Plan Discussed with: CRNA  Anesthesia Plan  Comments:         Anesthesia Quick Evaluation

## 2018-05-11 NOTE — Op Note (Signed)
PATIENT: Cindy Griffin  72 y.o. female  Patient Care Team: Cindy Sizer, MD as PCP - General (Family Medicine) Cindy Salina, MD as Consulting Physician (Physical Medicine and Rehabilitation) Cindy Morse, MD as Consulting Physician (Orthopedic Surgery)  PREOP DIAGNOSIS: Colon polyp  POSTOP DIAGNOSIS: Colon polyp  PROCEDURE: Laparoscopic right hemicolectomy  SURGEON: Cindy Mt. Dema Severin, MD  ASSISTANT: Cindy Ruff, MD - An MD assistant was necessary for complex exposure related to obesity and prior operations  ANESTHESIA: General endotracheal  EBL: 50cc Total I/O In: 2000 [I.V.:2000] Out: 450 [Urine:400; Blood:50]  DRAINS: None  SPECIMEN: Terminal ileum + right colon  COUNTS: Sponge, needle and instrument counts were reported correct x2  FINDINGS:  Significant adhesions from prior pelvic surgery. Ultimately freed. Right hemicolectomy with stapled side to side functional end to end ileocolic anastomosis - included right branch of middle colic vessel in resection.  STATEMENT OF MEDICAL NECESSITY: Cindy Griffin is a very pleasant 72 year old female, Jehovah's Witness, with a history of hypertension, diabetes, hyperlipidemia referred to our office for evaluation for surgery. she underwent a colonoscopy 3 years ago with Dr. Candace Griffin - She had approximately 6 polyps removed from the colon - all of which came back as tubular adenomas. She was told she needed to have a repeat colonoscopy in 3 years for which she followed up for. Dr. Vicente Griffin took her for a colonoscopy on 03/13/18 findings significant for: -nonbleeding internal hemorrhoids -Diverticulosis in the sigmoid colon -Polyps: 1. cecum - TA 2. cecum - TA 3. polypoid lesion at hepatic flexure, biopsied, 2 cm in size, left in situ-at least high-grade dysplasia arising in a tubular adenoma with ulceration. Area tattooed 4. Descending colon polyp-fecal debris only  This polyp was deemed to be endoscopically unresectable  by Dr. Vicente Griffin.   We discussed at length again today the anatomy and physiology of the GI tract. We discussed the pathophysiology of colon polyps and colon cancer with associated pictures. We discussed her particular biopsy results and the fact that this has been deemed endoscopically unresectable and may contain cancer. Given this, I had recommended laparoscopic right hemicolectomy.  The planned procedure and possibility of conversion to open approach, material risks (including, but not limited to, pain, bleeding, infection, scarring, damage to surrounding structures- blood vessels/nerves/viscus/organs, damage to ureter, urine leak, leak from anastomosis, need for additional procedures, need for stoma which may be permanent, hernia, recurrence, pneumonia, heart attack, stroke, death) benefits and alternatives to surgery were discussed at length. 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 We discussed that should major bleeding occurr, which certainly is possible with any abdominal operation, a blood transfusion may be necessary to save her life. She said that should this be the scenario occur, she would elect to not receive any blood products and accepts the risk that this could lead to death. The patient's questions were answered to her satisfaction, she voiced understanding and elected to proceed with surgery. Additionally, we discussed typical postoperative expectations and the recovery process  NARRATIVE:  The patient was identified & brought into the operating room, placed supine on the operating table and SCDs were applied to the lower extremities. General endotracheal anesthesia was induced. The patient was positioned in lithotomy with left arm tucked. All pressure points were examined and appropriately padded. Antibiotics were administered. A foley catheter was placed under sterile conditions. The abdomen was prepped and  draped in a sterile fashion. A timeout was performed confirming our  patient and plan.   Access to the abdomen was gained in the left upper quadrant at Palmer's point using a Veress needle.  Intraperitoneal location was confirmed using the aspiration and saline drop test.  The abdomen was then insufflated with CO2 to a pressure of 15 mmHg.  An Optiview trocar was then placed at this location under direct visualization.  Intraperitoneal inspection revealed no evidence of Veress needle or trocar site complication.  The tattoo was readily identified at the hepatic flexure there was copious amounts of black ink at this location.  She had significant amount of omental adhesions to her midline abdomen as well as down to her pelvis.  2 additional 5 mm trochars were placed one in the right abdomen and another in the left abdomen under direct visualization.  The omental adhesions were carefully taken down sharply ensuring hemostasis throughout.  Bilateral TAP blocks were then performed using a dilute mixture of Exparel with Marcaine.  A supraumbilical midline incision was made which would be the subsequent extraction site.  The midline fascia was incised.  The perineal cavity was again entered at this location.  An Alexis wound protector was placed and a yellow Placed on the wound protector.  A 12 mm port was placed through this Alexis port.  The patient was then placed in trendelenburg with left side down.  The cecum and appendix were identified and reflected cephalad.  Adhesions of the terminal ileum into the pelvis were carefully taken bluntly, taking care to preserve all retro-peritoneal structures free of injury.  After the terminal ileum was freed from the pelvis and additional omental adhesions were freed, a lateral to medial approach was employed.  The Cindy Griffin line of Toldt was incised from the cecum up to the hepatic flexure, reflecting the colon medially.  As we approached the hepatic flexure, the duodenum was  readily identified and preserved free of injury.  The colon mesentery was swept free from the duodenum.  The patient was then positioned in reverse Trendelenburg and the omentum was retracted caudad.  The omentum was then elevated and an avascular plane identified. This was opened and the lesser sac was entered. The omentum was taken off the colon at this location - overlying the mid transverse colon.  This was freed all the way over to the hepatic flexure and again the duodenum was identified and preserved free of injury.  Attention was then turned to division of the ileocolic pedicle.  The patient was repositioned in Trendelenburg and the cecum was elevated.  The ileocolic pedicle was readily identified.  The overlying peritoneum was incised and the ileocolic pedicle was gently grasped and circumferentially dissected.  The duodenum was again identified and confirmed to be down.  At this point, the ileocolic pedicle was divided using the Enseal.  The pedicle was then observed and noted to be hemostatic.   A grasper was placed on the appendix, the abdomen was desufflated, and the plan specimen was extracted through the [facility] wound protector.  This included, the terminal ileum and right colon.  These easily reached to the midline without any tension on the mesentery.  The terminal ileum was transected using a GIA blue load stapler.  The intervening mesentery between this and the middle branch of the middle colic vessel was then ligated using the Enseal device.  One venous bleeder was identified in the mesentery that was controlled using a 2-0 silk suture ligature.  The middle colic pedicle was identified in the right branch of the  middle colic was additionally divided and taken with the specimen.  At the location of planned transection, the middle colic and marginals clearly perfused the segment of colon.  The transverse colon dislocation was then transected using a GIA blue load stapler. The specimen was passed  off.  The ileum and transverse colon were then aligned with one another ensuring that the mesentery was not twisted.  The antimesenteric corners of the staple lines were then trimmed and a side-to-side, functional end-to-end anastomosis was created using a GIA 75 mm blue load stapler.  The staple line was then carefully observed and noted to be hemostatic.  The common enterotomy was then aligned using Allis clamps ensuring the entire enterotomy was up within the clamps.  The common enterotomy was then closed after distracting staple lines using a TA 90 blue load.  The corners were then dunked with 3-0 silk suture.  A 3-0 silk anti-tension/crotch suture was then placed.  The anastomosis was palpated and noted to be widely patent. This was then placed back into the abdomen. The omentum was then brought down over the anastomosis. The Alexis wound protector was removed, and we switched to clean instruments, gowns and drapes.  The fascia was then closed using running #1 PDS sutures.  Skin of all incision sites with an approximate 4-0 Monocryl suture. Dermabond was placed on the port sites and a sterile dressing was placed over the abdominal incision. All counts were correct per operating room staff. The patient was then awakened from anesthesia, extubated and transferred to a stretcher for transport to PACU in satisfactory condition.  DISPOSITION: PACU in satisfactory condition  Note: This dictation was prepared with Dragon/digital dictation along with [employer]. Any transcriptional errors that result from this process are unintentional.
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
## 2018-05-11 NOTE — Anesthesia Procedure Notes (Signed)
Procedure Name: Intubation Date/Time: 05/11/2018 12:32 PM Performed by: Soha Thorup D, CRNA Pre-anesthesia Checklist: Patient identified, Emergency Drugs available, Suction available and Patient being monitored Patient Re-evaluated:Patient Re-evaluated prior to induction Oxygen Delivery Method: Circle system utilized Preoxygenation: Pre-oxygenation with 100% oxygen Induction Type: IV induction Ventilation: Mask ventilation without difficulty Laryngoscope Size: Mac and 4 Grade View: Grade I Tube type: Oral Tube size: 7.5 mm Number of attempts: 1 Airway Equipment and Method: Stylet Placement Confirmation: ETT inserted through vocal cords under direct vision,  positive ETCO2 and breath sounds checked- equal and bilateral Secured at: 22 cm Tube secured with: Tape Dental Injury: Teeth and Oropharynx as per pre-operative assessment

## 2018-05-12 ENCOUNTER — Encounter (HOSPITAL_COMMUNITY): Payer: Self-pay | Admitting: Surgery

## 2018-05-12 LAB — CBC
HCT: 32.4 % — ABNORMAL LOW (ref 36.0–46.0)
Hemoglobin: 10.6 g/dL — ABNORMAL LOW (ref 12.0–15.0)
MCH: 28.6 pg (ref 26.0–34.0)
MCHC: 32.7 g/dL (ref 30.0–36.0)
MCV: 87.6 fL (ref 78.0–100.0)
Platelets: 269 10*3/uL (ref 150–400)
RBC: 3.7 MIL/uL — ABNORMAL LOW (ref 3.87–5.11)
RDW: 14.7 % (ref 11.5–15.5)
WBC: 12.2 10*3/uL — ABNORMAL HIGH (ref 4.0–10.5)

## 2018-05-12 LAB — GLUCOSE, CAPILLARY
Glucose-Capillary: 137 mg/dL — ABNORMAL HIGH (ref 65–99)
Glucose-Capillary: 195 mg/dL — ABNORMAL HIGH (ref 65–99)
Glucose-Capillary: 155 mg/dL — ABNORMAL HIGH (ref 65–99)
Glucose-Capillary: 104 mg/dL — ABNORMAL HIGH (ref 65–99)

## 2018-05-12 LAB — BASIC METABOLIC PANEL
Anion gap: 8 (ref 5–15)
BUN: 17 mg/dL (ref 6–20)
CO2: 25 mmol/L (ref 22–32)
Calcium: 8.6 mg/dL — ABNORMAL LOW (ref 8.9–10.3)
Chloride: 105 mmol/L (ref 101–111)
Creatinine, Ser: 1.02 mg/dL — ABNORMAL HIGH (ref 0.44–1.00)
GFR calc Af Amer: >60 mL/min (ref >60)
GFR calc non Af Amer: 54 mL/min — ABNORMAL LOW (ref >60)
Glucose, Bld: 148 mg/dL — ABNORMAL HIGH (ref 65–99)
Potassium: 3.8 mmol/L (ref 3.5–5.1)
Sodium: 138 mmol/L (ref 135–145)

## 2018-05-12 LAB — MAGNESIUM: Magnesium: 1.3 mg/dL — ABNORMAL LOW (ref 1.7–2.4)

## 2018-05-12 LAB — PHOSPHORUS: Phosphorus: 3.9 mg/dL (ref 2.5–4.6)

## 2018-05-12 MED ORDER — TRAMADOL HCL 50 MG PO TABS: 50.0000 mg | ORAL_TABLET | Freq: Four times a day (QID) | ORAL | 0 refills | Status: AC | PRN

## 2018-05-12 MED ORDER — TRAMADOL HCL 50 MG PO TABS
50.0000 mg | ORAL_TABLET | Freq: Four times a day (QID) | ORAL | Status: DC | PRN
  Administered 2018-05-13 – 2018-05-14 (×2): 50 mg via ORAL
  Filled 2018-05-12 (×2): qty 1

## 2018-05-12 MED ORDER — MAGNESIUM SULFATE 4 GM/100ML IV SOLN
4.0000 g | Freq: Once | INTRAVENOUS | Status: AC
  Administered 2018-05-12: 4 g via INTRAVENOUS
  Filled 2018-05-12: qty 100

## 2018-05-12 MED ORDER — GLUCERNA SHAKE PO LIQD
237.0000 mL | Freq: Three times a day (TID) | ORAL | Status: DC
  Administered 2018-05-12 – 2018-05-14 (×6): 237 mL via ORAL
  Filled 2018-05-12 (×12): qty 237

## 2018-05-12 NOTE — Evaluation (Signed)
Physical Therapy Evaluation Patient Details Name: Cindy Griffin MRN: [id number] DOB: 1946/08/03 Today's Date: 05/12/2018   History of Present Illness  72 year old female, Jehovah's Witness, with a history of hypertension, diabetes, hyperlipidemia. s/p R hemicolectomy 05/11/18.  Clinical Impression  Pt admitted with above diagnosis. Pt currently with functional limitations due to the deficits listed below (see PT Problem List). Pt ambulated 400' with IV pole and min hand held assist, no loss of balance. Encouraged pt to ambulate TID in halls. Pt's eyes are very bloodshot, R worse than L, she reports this is new since surgery and denies itching/drainage. RN notified.  Pt will benefit from skilled PT to increase their independence and safety with mobility to allow discharge to the venue listed below.       Follow Up Recommendations No PT follow up    Equipment Recommendations  Other (comment);Rolling walker with 5" wheels(4 wheeled RW with seat)    Recommendations for Other Services       Precautions / Restrictions Precautions Precautions: Fall Precaution Comments: abdominal incision, denies falls in past 1 year Restrictions Weight Bearing Restrictions: No      Mobility  Bed Mobility               General bed mobility comments: pt up in recliner, verbally instructed her in log roll technique  Transfers Overall transfer level: Modified independent               General transfer comment: used armrests to push up  Ambulation/Gait Ambulation/Gait assistance: Min guard Gait Distance (Feet): 400 Feet Assistive device: IV Pole;1 person hand held assist Gait Pattern/deviations: WFL(Within Functional Limits) Gait velocity: WFL   General Gait Details: min HHA and IV pole for balance, encouraged pt to try RW next time she walks, no loss of balance, SaO2 94% on room air  Stairs            Wheelchair Mobility    Modified Rankin (Stroke Patients Only)        Balance Overall balance assessment: Modified Independent                                           Pertinent Vitals/Pain Pain Assessment: 0-10 Pain Score: 4  Pain Location: abdominal incision Pain Descriptors / Indicators: Tightness Pain Intervention(s): Limited activity within patient's tolerance;Monitored during session;Ice applied;Premedicated before session    [place] expects to be discharged to:: Private residence Living Arrangements: Alone Available Help at Discharge: Family;Available 24 hours/day   Home Access: Level entry     Home Layout: One level Home Equipment: None Additional Comments: lives alone, will DC to daughter's home    Prior Function Level of Independence: Independent         Comments: drives, independent with ADLs     Hand Dominance        Extremity/Trunk Assessment   Upper Extremity Assessment Upper Extremity Assessment: Overall WFL for tasks assessed    Lower Extremity Assessment Lower Extremity Assessment: Overall WFL for tasks assessed    Cervical / Trunk Assessment Cervical / Trunk Assessment: Normal  Communication   Communication: No difficulties  Cognition Arousal/Alertness: Awake/alert Behavior During Therapy: WFL for tasks assessed/performed Overall Cognitive Status: Within Functional Limits for tasks assessed  General Comments      Exercises     Assessment/Plan    PT Assessment Patient needs continued PT services  PT Problem List         PT Treatment Interventions DME instruction;Gait training;Therapeutic activities;Therapeutic exercise    PT Goals (Current goals can be found in the Care Plan section)  Acute Rehab PT Goals Patient Stated Goal: [place] work PT Goal Formulation: With patient Time For Goal Achievement: 05/26/18 Potential to Achieve Goals: Good    Frequency Min 3X/week    Barriers to discharge        Co-evaluation               AM-PAC PT "6 Clicks" Daily Activity  Outcome Measure Difficulty turning over in bed (including adjusting bedclothes, sheets and blankets)?: A Lot Difficulty moving from lying on back to sitting on the side of the bed? : A Lot Difficulty sitting down on and standing up from a chair with arms (e.g., wheelchair, bedside commode, etc,.)?: A Little Help needed moving to and from a bed to chair (including a wheelchair)?: A Little Help needed walking in hospital room?: A Little Help needed climbing 3-5 steps with a railing? : A Lot 6 Click Score: 15    End of Session   Activity Tolerance: Patient tolerated treatment well Patient left: in chair;with call bell/phone within reach Nurse Communication: Mobility status PT Visit Diagnosis: Difficulty in walking, not elsewhere classified (R26.2);Pain    Time: 6767-2094 PT Time Calculation (min) (ACUTE ONLY): 23 min   Charges:   PT Evaluation $PT Eval Low Complexity: 1 Low PT Treatments $Gait Training: 8-22 mins   PT G Codes:          Philomena Doheny 05/12/2018, 11:29 AM [phone]

## 2018-05-12 NOTE — Progress Notes (Addendum)
Subjective No acute events. Feeling well. She has been up walking 3x since surgery. She has been having flatus and bowel movements. She denies nausea or vomiting. She is tolerating liquids. Reports her pain is well controlled with tylenol and motrin.  Objective: Vital signs in last 24 hours: Temp:  [97.9 F (36.6 C)-99 F (37.2 C)] 98.5 F (36.9 C) (06/14 0631) Pulse Rate:  [80-92] 80 (06/14 0631) Resp:  [14-20] 18 (06/14 0631) BP: (128-159)/(59-79) 129/59 (06/14 0631) SpO2:  [95 %-100 %] 98 % (06/14 0726) Weight:  [108 kg (238 lb)-111.1 kg (245 lb)] 111.1 kg (245 lb) (06/14 0500)    Intake/Output from previous day: 06/13 0701 - 06/14 0700 In: 4296.7 [P.O.:700; I.V.:3496.7; IV Piggyback:100] Out: 1880 [Urine:1830; Blood:50] Intake/Output this shift: No intake/output data recorded.  Gen: NAD, comfortable CV: RRR Pulm: Normal work of breathing Abd: soft, expected tenderness around incisions, none elsewhere. Not significantly distended. Incisions c/d/i without erythema Ext: SCDs in place  Lab Results: CBC  Recent Labs    05/12/18 0449  WBC 12.2*  HGB 10.6*  HCT 32.4*  PLT 269   BMET Recent Labs    05/12/18 0449  NA 138  K 3.8  CL 105  CO2 25  GLUCOSE 148*  BUN 17  CREATININE 1.02*  CALCIUM 8.6*   PT/INR No results for input(s): LABPROT, INR in the last 72 hours. ABG No results for input(s): PHART, HCO3 in the last 72 hours.  Invalid input(s): PCO2, PO2  Studies/Results:  Anti-infectives: Anti-infectives (From admission, onward)   Start     Dose/Rate Route Frequency Ordered Stop   05/11/18 1400  neomycin (MYCIFRADIN) tablet 1,000 mg  Status:  Discontinued     1,000 mg Oral 3 times per day 05/11/18 0955 05/11/18 1000   05/11/18 1400  metroNIDAZOLE (FLAGYL) tablet 1,000 mg  Status:  Discontinued     1,000 mg Oral 3 times per day 05/11/18 0955 05/11/18 1000   05/11/18 1000  cefoTEtan (CEFOTAN) 2 g in sodium chloride 0.9 % 100 mL IVPB     2 g 200 mL/hr  over 30 Minutes Intravenous On call to O.R. 05/11/18 0955 05/11/18 1234       Assessment/Plan: Patient Active Problem List   Diagnosis Date Noted  . Colon polyp 05/11/2018  . Essential hemorrhagic thrombocythemia ([place]) 03/29/2018  . Hypercalcemia 11/23/2015  . DDD (degenerative disc disease), lumbar 07/28/2015  . Spinal stenosis at L4-L5 level 06/12/2015  . Benign essential HTN 05/25/2015  . Bunion 05/25/2015  . Cardiac enlargement 05/25/2015  . Cervical radicular pain 05/25/2015  . Back pain, chronic 05/25/2015  . Osteoarthritis 05/25/2015  . Dyslipidemia 05/25/2015  . Gastric reflux 05/25/2015  . Insomnia 05/25/2015  . Eczema intertrigo 05/25/2015  . Bronchitis, chronic, mucopurulent ([place]) 05/25/2015  . Numerous moles 05/25/2015  . Morbid obesity ([place]) 05/25/2015  . Perennial allergic rhinitis 05/25/2015  . DM (diabetes mellitus), type 2, uncontrolled, with renal complications ([place]) 67/10/4579  . Snores 05/25/2015  . History of artificial joint 05/25/2015  . Degeneration of intervertebral disc of cervical region 02/06/2015  . Neuritis or radiculitis due to rupture of lumbar intervertebral disc 01/14/2015  . Abnormal presence of protein in urine 05/04/2010  . Vitamin D deficiency 05/04/2010   Ms. Beyersdorf is a 71yoF with hx of HTN, HLD, DM, GERD with endoscopically unresetable polypoid lesion at hepatic flexure - s/p laparoscopic right hemicolectomy with stapled ileocolic anastomosis 9/98/33 - POD#1  -Diabetic full liquid diet; restarted home DM medications; monitor CBGs. If tolerates,  will advance to soft diet tomorrow -Hypomagnesemia - replace with 4g IV mag -D/C Entereg, D/C foley -She is a Jehovah's witness - will plan to repeat hgb tomorrow - 10 from 12 preop.   -Ambulate 5x/day -IS 10x/hr while awake -PPx: SQH, SCDs, H2B -Dispo: await toleration of diet   LOS: 1 day   Sharon Mt. Dema Severin, M.D. [facility]

## 2018-05-12 NOTE — Discharge Instructions (Signed)
POST OP INSTRUCTIONS  1. DIET: As tolerated. Be sure to include lots of fluids daily.  Avoid fast food or heavy meals for the first 1-2 weeks as your are more likely to get nauseated.  Eat a sensible diet but for the first 4 weeks, avoid raw fruits/vegetables. If you have fruits/vegetables, just make sure they are very well chewed and/or cooked until soft enough to mash on the roof of your mouth.  2. Take your usually prescribed home medications unless otherwise directed.  3. PAIN CONTROL: a. Pain is best controlled by a usual combination of three different methods TOGETHER: i. Ice/Heat ii. Over the counter pain medication iii. Prescription pain medication b. Most patients will experience some swelling and bruising around the surgical site.  Ice packs or heating pads (30-60 minutes up to 6 times a day) will help. Some people prefer to use ice alone, heat alone, alternating between ice & heat.  Experiment to what works for you.  Swelling and bruising can take several weeks to resolve.   c. It is helpful to take an over-the-counter pain medication regularly for the first few weeks: i. Ibuprofen (Motrin/Advil) - 200mg  tabs - take 3 tabs (600mg ) every 6 hours as needed for pain ii. Acetaminophen (Tylenol) - you may take 650mg  every 6 hours as needed. You can take this with motrin as they act differently on the body. If you are taking a narcotic pain medication that has acetaminophen in it, do not take over the counter tylenol at the same time.  Iii. NOTE: You may take both of these medications together - most patients  find it most helpful when alternating between the two (i.e. Ibuprofen at 6am,  tylenol at 9am, ibuprofen at 12pm ...) d. A  prescription for pain medication should be given to you upon discharge.  Take your pain medication as prescribed if your pain is not adequatly controlled with the over-the-counter pain reliefs mentioned above.  4. Avoid getting constipated.  Between the surgery and  the pain medications, it is common to experience some constipation.  Increasing fluid intake and taking a fiber supplement (such as Metamucil, Citrucel, FiberCon, MiraLax, etc) 1-2 times a day regularly will usually help prevent this problem from occurring.  A mild laxative (prune juice, Milk of Magnesia, MiraLax, etc) should be taken according to package directions if there are no bowel movements after 48 hours.    5. Dressing: Your incision is covered in Dermabond which is like sterile superglue for the skin. This will come off on it's own in a couple weeks. It is waterproof and you may bathe normally starting the day after your surgery in a shower. Avoid baths/pools/lakes/oceans until your wounds have fully healed.  6. ACTIVITIES as tolerated:   a. Avoid heavy lifting (>10lbs or 1 gallon of milk) for the next 6 weeks. b. You may resume regular (light) daily activities beginning the next day--such as daily self-care, walking, climbing stairs--gradually increasing activities as tolerated.  If you can walk 30 minutes without difficulty, it is safe to try more intense activity such as jogging, treadmill, bicycling, low-impact aerobics.  c. DO NOT PUSH THROUGH PAIN.  Let pain be your guide: If it hurts to do something, don't do it. d. Dennis Bast may drive when you are no longer taking prescription pain medication, you can comfortably wear a seatbelt, and you can safely maneuver your car and apply brakes.  7. FOLLOW UP in our office a. Please call [facility] at [phone] to set  up an appointment to see your surgeon in the office for a follow-up appointment approximately 2 weeks after your surgery. b. Make sure that you call for this appointment the day you arrive home to insure a convenient appointment time.  9. If you have disability or family leave forms that need to be completed, you may have them completed by your primary care physician's office; for return to work instructions, please ask our office staff and  they will be happy to assist you in obtaining this documentation   When to call us [phone]: 1. Poor pain control 2. Reactions / problems with new medications (rash/itching, etc)  3. Fever over 101.5 F (38.5 C) 4. Inability to urinate 5. Nausea/vomiting 6. Worsening swelling or bruising 7. Continued bleeding from incision. 8. Increased pain, redness, or drainage from the incision  The clinic staff is available to answer your questions during regular business hours (8:30am-5pm).  Please dont hesitate to call and ask to speak to one of our nurses for clinical concerns.   A surgeon from [facility] is always on call at the hospitals   If you have a medical emergency, go to the nearest emergency room or call 911.  [facility], [place] [address], [place], [place], [place]  [address] MAIN: [phone] FAX: [phone] [email or web address]

## 2018-05-12 NOTE — Anesthesia Postprocedure Evaluation (Signed)
Anesthesia Post Note  Patient: [name]  Procedure(s) Performed: LAPAROSCOPIC RIGHT HEMICOLECTOMY ERAS PATHWAY (Right Abdomen)     Patient location during evaluation: PACU Anesthesia Type: General Level of consciousness: awake and alert Pain management: pain level controlled Vital Signs Assessment: post-procedure vital signs reviewed and stable Respiratory status: spontaneous breathing, nonlabored ventilation, respiratory function stable and patient connected to nasal cannula oxygen Cardiovascular status: blood pressure returned to baseline and stable Postop Assessment: no apparent nausea or vomiting Anesthetic complications: no    Last Vitals:  Vitals:   05/12/18 0631 05/12/18 0726  BP: (!) 129/59   Pulse: 80   Resp: 18   Temp: 36.9 C   SpO2: 99% 98%    Last Pain:  Vitals:   05/12/18 0631  TempSrc: Oral  PainSc:                  Tiajuana Amass

## 2018-05-13 LAB — GLUCOSE, CAPILLARY
Glucose-Capillary: 100 mg/dL — ABNORMAL HIGH (ref 65–99)
Glucose-Capillary: 177 mg/dL — ABNORMAL HIGH (ref 65–99)

## 2018-05-13 NOTE — Progress Notes (Signed)
Patient ID: Cindy Griffin, female   DOB: 1946-02-22, 72 y.o.   MRN: [id number] [facility] Progress Note:   2 Days Post-Op  Subjective: Mental status is clear and pleasant Objective: Vital signs in last 24 hours: Temp:  [97.5 F (36.4 C)-98.4 F (36.9 C)] 98.4 F (36.9 C) (06/15 0505) Pulse Rate:  [70-83] 83 (06/15 0959) Resp:  [13-17] 16 (06/15 0959) BP: (116-147)/(63-81) 133/75 (06/15 0959) SpO2:  [94 %-100 %] 97 % (06/15 0959) Weight:  [108.3 kg (238 lb 11.2 oz)] 108.3 kg (238 lb 11.2 oz) (06/15 0500)  Intake/Output from previous day: 06/14 0701 - 06/15 0700 In: 2287.5 [P.O.:820; I.V.:1397.5; IV Piggyback:70] Out: 450 [Urine:450] Intake/Output this shift: No intake/output data recorded.  Physical Exam: Work of breathing is not labored;  Up walking about unit without problems.  Tolerating diet-advance to reg carb modified  Lab Results:  Results for orders placed or performed during the hospital encounter of 05/11/18 (from the past 48 hour(s))  Glucose, capillary     Status: Abnormal   Collection Time: 05/11/18 11:54 AM  Result Value Ref Range   Glucose-Capillary 184 (H) 65 - 99 mg/dL  Glucose, capillary     Status: Abnormal   Collection Time: 05/11/18 12:15 PM  Result Value Ref Range   Glucose-Capillary 117 (H) 65 - 99 mg/dL   Comment 1 Notify RN    Comment 2 Document in Chart   Glucose, capillary     Status: None   Collection Time: 05/11/18 12:58 PM  Result Value Ref Range   Glucose-Capillary 81 65 - 99 mg/dL  Glucose, capillary     Status: Abnormal   Collection Time: 05/11/18  1:45 PM  Result Value Ref Range   Glucose-Capillary 148 (H) 65 - 99 mg/dL   Comment 1 Call MD NNP PA CNM   Glucose, capillary     Status: Abnormal   Collection Time: 05/11/18  2:48 PM  Result Value Ref Range   Glucose-Capillary 175 (H) 65 - 99 mg/dL  Glucose, capillary     Status: Abnormal   Collection Time: 05/11/18  4:15 PM  Result Value Ref Range   Glucose-Capillary 190 (H)  65 - 99 mg/dL  Glucose, capillary     Status: Abnormal   Collection Time: 05/11/18  6:51 PM  Result Value Ref Range   Glucose-Capillary 207 (H) 65 - 99 mg/dL  Glucose, capillary     Status: Abnormal   Collection Time: 05/11/18 10:11 PM  Result Value Ref Range   Glucose-Capillary 168 (H) 65 - 99 mg/dL  Basic metabolic panel     Status: Abnormal   Collection Time: 05/12/18  4:49 AM  Result Value Ref Range   Sodium 138 135 - 145 mmol/L   Potassium 3.8 3.5 - 5.1 mmol/L   Chloride 105 101 - 111 mmol/L   CO2 25 22 - 32 mmol/L   Glucose, Bld 148 (H) 65 - 99 mg/dL   BUN 17 6 - 20 mg/dL   Creatinine, Ser 1.02 (H) 0.44 - 1.00 mg/dL   Calcium 8.6 (L) 8.9 - 10.3 mg/dL   GFR calc non Af Amer 54 (L) >60 mL/min   GFR calc Af Amer >60 >60 mL/min    Comment: (NOTE) The eGFR has been calculated using the CKD EPI equation. This calculation has not been validated in all clinical situations. eGFR's persistently <60 mL/min signify possible Chronic Kidney Disease.    Anion gap 8 5 - 15    Comment: Performed at [facility]  [place] [address], [place], [place] [address]  CBC     Status: Abnormal   Collection Time: 05/12/18  4:49 AM  Result Value Ref Range   WBC 12.2 (H) 4.0 - 10.5 K/uL   RBC 3.70 (L) 3.87 - 5.11 MIL/uL   Hemoglobin 10.6 (L) 12.0 - 15.0 g/dL   HCT 32.4 (L) 36.0 - 46.0 %   MCV 87.6 78.0 - 100.0 fL   MCH 28.6 26.0 - 34.0 pg   MCHC 32.7 30.0 - 36.0 g/dL   RDW 14.7 11.5 - 15.5 %   Platelets 269 150 - 400 K/uL    Comment: Performed at [facility], [place] [address] [place], [place] [address]  Magnesium     Status: Abnormal   Collection Time: 05/12/18  4:49 AM  Result Value Ref Range   Magnesium 1.3 (L) 1.7 - 2.4 mg/dL    Comment: Performed at [facility], [place] [address] [place], [place] [address]  Phosphorus     Status: None   Collection Time: 05/12/18  4:49 AM  Result Value Ref Range   Phosphorus 3.9 2.5 - 4.6 mg/dL     Comment: Performed at [facility], [place] [address] [place], [place] [address]  Glucose, capillary     Status: Abnormal   Collection Time: 05/12/18  7:20 AM  Result Value Ref Range   Glucose-Capillary 137 (H) 65 - 99 mg/dL  Glucose, capillary     Status: Abnormal   Collection Time: 05/12/18 12:09 PM  Result Value Ref Range   Glucose-Capillary 195 (H) 65 - 99 mg/dL  Glucose, capillary     Status: Abnormal   Collection Time: 05/12/18  5:21 PM  Result Value Ref Range   Glucose-Capillary 155 (H) 65 - 99 mg/dL  Glucose, capillary     Status: Abnormal   Collection Time: 05/12/18  9:43 PM  Result Value Ref Range   Glucose-Capillary 104 (H) 65 - 99 mg/dL  Glucose, capillary     Status: Abnormal   Collection Time: 05/13/18  7:58 AM  Result Value Ref Range   Glucose-Capillary 100 (H) 65 - 99 mg/dL    Radiology/Results: No results found.  Anti-infectives: Anti-infectives (From admission, onward)   Start     Dose/Rate Route Frequency Ordered Stop   05/11/18 1400  neomycin (MYCIFRADIN) tablet 1,000 mg  Status:  Discontinued     1,000 mg Oral 3 times per day 05/11/18 0955 05/11/18 1000   05/11/18 1400  metroNIDAZOLE (FLAGYL) tablet 1,000 mg  Status:  Discontinued     1,000 mg Oral 3 times per day 05/11/18 0955 05/11/18 1000   05/11/18 1000  cefoTEtan (CEFOTAN) 2 g in sodium chloride 0.9 % 100 mL IVPB     2 g 200 mL/hr over 30 Minutes Intravenous On call to O.R. 05/11/18 0955 05/11/18 1234      Assessment/Plan: Problem List: Patient Active Problem List   Diagnosis Date Noted  . Colon polyp 05/11/2018  . Essential hemorrhagic thrombocythemia ([place]) 03/29/2018  . Hypercalcemia 11/23/2015  . DDD (degenerative disc disease), lumbar 07/28/2015  . Spinal stenosis at L4-L5 level 06/12/2015  . Benign essential HTN 05/25/2015  . Bunion 05/25/2015  . Cardiac enlargement 05/25/2015  . Cervical radicular pain 05/25/2015  . Back pain, chronic 05/25/2015  . Osteoarthritis  05/25/2015  . Dyslipidemia 05/25/2015  . Gastric reflux 05/25/2015  . Insomnia 05/25/2015  . Eczema intertrigo 05/25/2015  . Bronchitis, chronic, mucopurulent ([place]) 05/25/2015  .  Numerous moles 05/25/2015  . Morbid obesity ([place]) 05/25/2015  . Perennial allergic rhinitis 05/25/2015  . DM (diabetes mellitus), type 2, uncontrolled, with renal complications ([place]) 24/58/0998  . Snores 05/25/2015  . History of artificial joint 05/25/2015  . Degeneration of intervertebral disc of cervical region 02/06/2015  . Neuritis or radiculitis due to rupture of lumbar intervertebral disc 01/14/2015  . Abnormal presence of protein in urine 05/04/2010  . Vitamin D deficiency 05/04/2010    Doing well post op.   2 Days Post-Op    LOS: 2 days   Matt B. Hassell Done, MD, [facility] [phone] beeper [phone]  05/13/2018 10:03 AM

## 2018-05-13 NOTE — Evaluation (Signed)
Occupational Therapy Evaluation Patient Details Name: Cindy Griffin MRN: [id number] DOB: 1946-10-12 Today's Date: 05/13/2018    History of Present Illness 72 year old female, Jehovah's Witness, with a history of hypertension, diabetes, hyperlipidemia. s/p R hemicolectomy 05/11/18.   Clinical Impression   Pt is at Mod I level with ADLs and ADL mobility. Pt will d/c to her daughter's home when medically ready. All education completed and no further acute OT indicated at this time    Follow Up Recommendations  No OT follow up;Supervision - Intermittent    Equipment Recommendations  Other (comment)(reacher)    Recommendations for Other Services       Precautions / Restrictions Precautions Precautions: Fall Precaution Comments: abdominal incision, denies falls in past 1 year Restrictions Weight Bearing Restrictions: No      Mobility Bed Mobility               General bed mobility comments: pt up in recliner upon arrival  Transfers Overall transfer level: Modified independent Equipment used: Rolling walker (2 wheeled)             General transfer comment: used armrests to push up    Balance Overall balance assessment: Modified Independent                                         ADL either performed or assessed with clinical judgement   ADL Overall ADL's : At baseline;Independent                                       General ADL Comments: no physical assist needed     Vision Baseline Vision/History: Wears glasses Patient Visual Report: No change from baseline       Perception     Praxis      Pertinent Vitals/Pain Pain Assessment: No/denies pain Pain Location: abdominal incision Pain Descriptors / Indicators: Tightness Pain Intervention(s): Monitored during session;Repositioned     Hand Dominance Right   Extremity/Trunk Assessment Upper Extremity Assessment Upper Extremity Assessment: Overall WFL for tasks  assessed   Lower Extremity Assessment Lower Extremity Assessment: Defer to PT evaluation   Cervical / Trunk Assessment Cervical / Trunk Assessment: Normal   Communication Communication Communication: No difficulties   Cognition Arousal/Alertness: Awake/alert Behavior During Therapy: WFL for tasks assessed/performed Overall Cognitive Status: Within Functional Limits for tasks assessed                                     General Comments       Exercises     Shoulder Instructions      Home Living Family/patient expects to be discharged to:: Private residence Living Arrangements: Alone Available Help at Discharge: Family;Available 24 hours/day Type of Home: House Home Access: Level entry     Home Layout: One level     Bathroom Shower/Tub: Teacher, early years/pre: Standard         Additional Comments: lives alone, will DC to daughter's home      Prior Functioning/Environment Level of Independence: Independent        Comments: drives, independent with ADLs        OT Problem List: Decreased activity tolerance;Pain      OT Treatment/Interventions:  OT Goals(Current goals can be found in the care plan section) Acute Rehab OT Goals Patient Stated Goal: [place] work OT Goal Formulation: With patient  OT Frequency:     Barriers to D/C:    no barriers, will d/c to her daughter's home       Co-evaluation              AM-PAC PT "6 Clicks" Daily Activity     Outcome Measure Help from another person eating meals?: None Help from another person taking care of personal grooming?: None Help from another person toileting, which includes using toliet, bedpan, or urinal?: None Help from another person bathing (including washing, rinsing, drying)?: None Help from another person to put on and taking off regular upper body clothing?: None Help from another person to put on and taking off regular lower body  clothing?: None 6 Click Score: 24   End of Session Equipment Utilized During Treatment: Rolling walker  Activity Tolerance: Patient tolerated treatment well Patient left: in chair;with call bell/phone within reach;with family/visitor present  OT Visit Diagnosis: Other abnormalities of gait and mobility (R26.89);Pain Pain - part of body: (abdominal incision)                Time: 5015-8682 OT Time Calculation (min): 27 min Charges:  OT General Charges $OT Visit: 1 Visit OT Evaluation $OT Eval Low Complexity: 1 Low OT Treatments $Therapeutic Activity: 8-22 mins G-Codes: OT G-codes **NOT FOR INPATIENT CLASS** Functional Assessment Tool Used: AM-PAC 6 Clicks Daily Activity     Britt Bottom 05/13/2018, 1:08 PM

## 2018-05-14 LAB — CBC WITH DIFFERENTIAL/PLATELET
Basophils Absolute: 0 10*3/uL (ref 0.0–0.1)
Basophils Relative: 0 %
Eosinophils Absolute: 0.3 10*3/uL (ref 0.0–0.7)
Eosinophils Relative: 3 %
HCT: 31.1 % — ABNORMAL LOW (ref 36.0–46.0)
Hemoglobin: 10.2 g/dL — ABNORMAL LOW (ref 12.0–15.0)
Lymphocytes Relative: 23 %
Lymphs Abs: 2.1 10*3/uL (ref 0.7–4.0)
MCH: 28.9 pg (ref 26.0–34.0)
MCHC: 32.8 g/dL (ref 30.0–36.0)
MCV: 88.1 fL (ref 78.0–100.0)
Monocytes Absolute: 0.8 10*3/uL (ref 0.1–1.0)
Monocytes Relative: 8 %
Neutro Abs: 6 10*3/uL (ref 1.7–7.7)
Neutrophils Relative %: 66 %
Platelets: 251 10*3/uL (ref 150–400)
RBC: 3.53 MIL/uL — ABNORMAL LOW (ref 3.87–5.11)
RDW: 14.9 % (ref 11.5–15.5)
WBC: 9.2 10*3/uL (ref 4.0–10.5)

## 2018-05-14 LAB — GLUCOSE, CAPILLARY
Glucose-Capillary: 156 mg/dL — ABNORMAL HIGH (ref 65–99)
Glucose-Capillary: 148 mg/dL — ABNORMAL HIGH (ref 65–99)
Glucose-Capillary: 100 mg/dL — ABNORMAL HIGH (ref 65–99)
Glucose-Capillary: 145 mg/dL — ABNORMAL HIGH (ref 65–99)

## 2018-05-14 NOTE — Progress Notes (Signed)
Patient ID: Cindy Griffin, female   DOB: Dec 10, 1945, 72 y.o.   MRN: [id number] [facility] Surgery Progress Note:   3 Days Post-Op  Subjective: Mental status is clear;  She is up walking the floor with a walker and the nurse aid.  She is feeling a bit gaseous and sore in the incisions.   Objective: Vital signs in last 24 hours: Temp:  [98.3 F (36.8 C)-99.2 F (37.3 C)] 98.5 F (36.9 C) (06/16 0417) Pulse Rate:  [72-83] 80 (06/16 0417) Resp:  [16-17] 16 (06/16 0417) BP: (110-156)/(58-104) 110/70 (06/16 0417) SpO2:  [93 %-99 %] 97 % (06/16 0812) Weight:  [113.4 kg (250 lb 0.2 oz)] 113.4 kg (250 lb 0.2 oz) (06/16 0417)  Intake/Output from previous day: 06/15 0701 - 06/16 0700 In: 1566.7 [P.O.:360; I.V.:1206.7] Out: -  Intake/Output this shift: No intake/output data recorded.  Physical Exam: Work of breathing is not labored.  Bowels functioning.    Lab Results:  Results for orders placed or performed during the hospital encounter of 05/11/18 (from the past 48 hour(s))  Glucose, capillary     Status: Abnormal   Collection Time: 05/12/18 12:09 PM  Result Value Ref Range   Glucose-Capillary 195 (H) 65 - 99 mg/dL  Glucose, capillary     Status: Abnormal   Collection Time: 05/12/18  5:21 PM  Result Value Ref Range   Glucose-Capillary 155 (H) 65 - 99 mg/dL  Glucose, capillary     Status: Abnormal   Collection Time: 05/12/18  9:43 PM  Result Value Ref Range   Glucose-Capillary 104 (H) 65 - 99 mg/dL  Glucose, capillary     Status: Abnormal   Collection Time: 05/13/18  7:58 AM  Result Value Ref Range   Glucose-Capillary 100 (H) 65 - 99 mg/dL  Glucose, capillary     Status: Abnormal   Collection Time: 05/13/18  5:16 PM  Result Value Ref Range   Glucose-Capillary 177 (H) 65 - 99 mg/dL  CBC with Differential/Platelet     Status: Abnormal   Collection Time: 05/14/18  3:59 AM  Result Value Ref Range   WBC 9.2 4.0 - 10.5 K/uL   RBC 3.53 (L) 3.87 - 5.11 MIL/uL   Hemoglobin 10.2  (L) 12.0 - 15.0 g/dL   HCT 31.1 (L) 36.0 - 46.0 %   MCV 88.1 78.0 - 100.0 fL   MCH 28.9 26.0 - 34.0 pg   MCHC 32.8 30.0 - 36.0 g/dL   RDW 14.9 11.5 - 15.5 %   Platelets 251 150 - 400 K/uL   Neutrophils Relative % 66 %   Neutro Abs 6.0 1.7 - 7.7 K/uL   Lymphocytes Relative 23 %   Lymphs Abs 2.1 0.7 - 4.0 K/uL   Monocytes Relative 8 %   Monocytes Absolute 0.8 0.1 - 1.0 K/uL   Eosinophils Relative 3 %   Eosinophils Absolute 0.3 0.0 - 0.7 K/uL   Basophils Relative 0 %   Basophils Absolute 0.0 0.0 - 0.1 K/uL    Comment: Performed at [facility], [place] [address] [place], [place] [address]  Glucose, capillary     Status: Abnormal   Collection Time: 05/14/18  8:04 AM  Result Value Ref Range   Glucose-Capillary 156 (H) 65 - 99 mg/dL    Radiology/Results: No results found.  Anti-infectives: Anti-infectives (From admission, onward)   Start     Dose/Rate Route Frequency Ordered Stop   05/11/18 1400  neomycin (MYCIFRADIN) tablet 1,000 mg  Status:  Discontinued  1,000 mg Oral 3 times per day 05/11/18 0955 05/11/18 1000   05/11/18 1400  metroNIDAZOLE (FLAGYL) tablet 1,000 mg  Status:  Discontinued     1,000 mg Oral 3 times per day 05/11/18 0955 05/11/18 1000   05/11/18 1000  cefoTEtan (CEFOTAN) 2 g in sodium chloride 0.9 % 100 mL IVPB     2 g 200 mL/hr over 30 Minutes Intravenous On call to O.R. 05/11/18 0955 05/11/18 1234      Assessment/Plan: Problem List: Patient Active Problem List   Diagnosis Date Noted  . Colon polyp 05/11/2018  . Essential hemorrhagic thrombocythemia ([place]) 03/29/2018  . Hypercalcemia 11/23/2015  . DDD (degenerative disc disease), lumbar 07/28/2015  . Spinal stenosis at L4-L5 level 06/12/2015  . Benign essential HTN 05/25/2015  . Bunion 05/25/2015  . Cardiac enlargement 05/25/2015  . Cervical radicular pain 05/25/2015  . Back pain, chronic 05/25/2015  . Osteoarthritis 05/25/2015  . Dyslipidemia 05/25/2015  . Gastric reflux  05/25/2015  . Insomnia 05/25/2015  . Eczema intertrigo 05/25/2015  . Bronchitis, chronic, mucopurulent ([place]) 05/25/2015  . Numerous moles 05/25/2015  . Morbid obesity ([place]) 05/25/2015  . Perennial allergic rhinitis 05/25/2015  . DM (diabetes mellitus), type 2, uncontrolled, with renal complications ([place]) 68/15/9470  . Snores 05/25/2015  . History of artificial joint 05/25/2015  . Degeneration of intervertebral disc of cervical region 02/06/2015  . Neuritis or radiculitis due to rupture of lumbar intervertebral disc 01/14/2015  . Abnormal presence of protein in urine 05/04/2010  . Vitamin D deficiency 05/04/2010    She is getting along well but is not quite ready for discharge today.  Iron supplement in place.  Hg 10.6.  Hopeful discharge tomorrow.   3 Days Post-Op    LOS: 3 days   Matt B. Hassell Done, MD, [facility] [phone] beeper [phone]  05/14/2018 9:01 AM

## 2018-05-15 LAB — GLUCOSE, CAPILLARY
Glucose-Capillary: 152 mg/dL — ABNORMAL HIGH (ref 65–99)
Glucose-Capillary: 149 mg/dL — ABNORMAL HIGH (ref 65–99)
Glucose-Capillary: 157 mg/dL — ABNORMAL HIGH (ref 65–99)
Glucose-Capillary: 166 mg/dL — ABNORMAL HIGH (ref 65–99)

## 2018-05-15 NOTE — Discharge Summary (Signed)
Patient ID: Cindy Griffin MRN: [id number] DOB/AGE: [birth year] 72 y.o.  Admit date: 05/11/2018 Discharge date: 05/15/2018  Discharge Diagnoses Patient Active Problem List   Diagnosis Date Noted  . Colon polyp 05/11/2018  . Essential hemorrhagic thrombocythemia ([place]) 03/29/2018  . Hypercalcemia 11/23/2015  . DDD (degenerative disc disease), lumbar 07/28/2015  . Spinal stenosis at L4-L5 level 06/12/2015  . Benign essential HTN 05/25/2015  . Bunion 05/25/2015  . Cardiac enlargement 05/25/2015  . Cervical radicular pain 05/25/2015  . Back pain, chronic 05/25/2015  . Osteoarthritis 05/25/2015  . Dyslipidemia 05/25/2015  . Gastric reflux 05/25/2015  . Insomnia 05/25/2015  . Eczema intertrigo 05/25/2015  . Bronchitis, chronic, mucopurulent ([place]) 05/25/2015  . Numerous moles 05/25/2015  . Morbid obesity ([place]) 05/25/2015  . Perennial allergic rhinitis 05/25/2015  . DM (diabetes mellitus), type 2, uncontrolled, with renal complications ([place]) 64/68/0321  . Snores 05/25/2015  . History of artificial joint 05/25/2015  . Degeneration of intervertebral disc of cervical region 02/06/2015  . Neuritis or radiculitis due to rupture of lumbar intervertebral disc 01/14/2015  . Abnormal presence of protein in urine 05/04/2010  . Vitamin D deficiency 05/04/2010    Consultants None  Procedures Laparoscopic right hemicolectomy (05/11/18)  Hospital Course: Patient was taken to the OR 05/11/18 for the above mentioned procedure. She recovered uneventfully from this. She began having bowel function on POD#1 but was having gas discomfort and some bloating. Her diet was gradually advanced. On POD#4, she was tolerating a regular diet without nausea/vomiting, having bowel movements which were non-bloody, pain was controlled on oral analgesics and she cleared PT/OT therapies. Her gas pains were improving quite a bit and she was deemed stable for discharge home.  Exam AF, VS normal RRR GI: Abdomen soft,  nontender, nondistended, incisions c/d/i without erythema. Ext: No pitting edema in any ext    Allergies as of 05/15/2018   No Known Allergies     Medication List    TAKE these medications   ACCU-CHEK NANO SMARTVIEW w/Device Kit 1 Device by Does not apply route 3 (three) times daily.   acetaminophen 500 MG tablet Commonly known as:  TYLENOL Take 1,000 mg by mouth every 6 (six) hours as needed for moderate pain or headache.   albuterol 108 (90 Base) MCG/ACT inhaler Commonly known as:  PROVENTIL HFA;VENTOLIN HFA INHALE 2 PUFFS INTO LUNGS EVERY 6 HOURS AS NEEDED FOR WHEEZING OR SHORTNESS OF BREATH   aspirin EC 81 MG tablet Take 1 tablet (81 mg total) by mouth daily.   atorvastatin 80 MG tablet Commonly known as:  LIPITOR Take 1 tablet (80 mg total) by mouth daily. What changed:  when to take this   benzonatate 100 MG capsule Commonly known as:  TESSALON Take 1-2 capsules (100-200 mg total) by mouth 3 (three) times daily as needed. What changed:  reasons to take this   celecoxib 200 MG capsule Commonly known as:  CELEBREX TAKE 1 CAPSULE BY MOUTH ONCE DAILY   cetirizine 10 MG tablet Commonly known as:  ZYRTEC Take 10 mg by mouth daily as needed for allergies.   ferrous sulfate 325 (65 FE) MG EC tablet Take 325 mg by mouth daily.   fluticasone 50 MCG/ACT nasal spray Commonly known as:  FLONASE Place 2 sprays into both nostrils daily. What changed:    when to take this  reasons to take this   GLIPIZIDE XL 10 MG 24 hr tablet Generic drug:  glipiZIDE TAKE 1 TABLET BY MOUTH ONCE DAILY WITH BREAKFAST  glucose blood test strip Commonly known as:  ADVOCATE REDI-CODE 1 each by Other route QID.   hydrochlorothiazide 25 MG tablet Commonly known as:  HYDRODIURIL Take 1 tablet (25 mg total) by mouth daily. What changed:  when to take this   lisinopril 40 MG tablet Commonly known as:  PRINIVIL,ZESTRIL Take 1 tablet (40 mg total) by mouth daily.   LUBRICATING EYE  DROPS OP Place 2 drops into both eyes 2 (two) times daily as needed (for dry eyes).   metFORMIN 750 MG 24 hr tablet Commonly known as:  GLUCOPHAGE-XR Take 1 tablet (750 mg total) by mouth daily with breakfast.   MULTIVITAMIN PO Take 1 tablet by mouth daily.   omega-3 acid ethyl esters 1 g capsule Commonly known as:  LOVAZA Take 660 mg by mouth daily.   pioglitazone 15 MG tablet Commonly known as:  ACTOS Take 1 tablet (15 mg total) by mouth daily.   ranitidine 150 MG tablet Commonly known as:  ZANTAC TAKE 1 TABLET BY MOUTH TWICE DAILY. REPLACES OMEPRAZOLE. What changed:    how much to take  how to take this  when to take this  additional instructions   Semaglutide 1 MG/DOSE Sopn Commonly known as:  OZEMPIC Inject 0.5-1 mg into the skin once a week. What changed:    how much to take  when to take this   tiZANidine 2 MG tablet Commonly known as:  ZANAFLEX Take 2 mg by mouth daily as needed for muscle spasms.   traMADol 50 MG tablet Commonly known as:  ULTRAM Take 1 tablet (50 mg total) by mouth every 6 (six) hours as needed for up to 7 days (postop pain not controlled with tylenol). What changed:    reasons to take this  additional instructions   traZODone 50 MG tablet Commonly known as:  DESYREL Take 0.5-1 tablets (25-50 mg total) by mouth at bedtime as needed for sleep.   umeclidinium-vilanterol 62.5-25 MCG/INH Aepb Commonly known as:  ANORO ELLIPTA Inhale 1 puff into the lungs daily.   vitamin C 1000 MG tablet Take 1,000 mg by mouth daily.   Vitamin D-3 1000 units Caps Take 1,000 Units by mouth daily.       Sharon Mt. Dema Severin, M.D. [facility]

## 2018-05-15 NOTE — Progress Notes (Addendum)
Physical Therapy Treatment Patient Details Name: Cindy Griffin MRN: [id number] DOB: 28-Sep-1946 Today's Date: 05/15/2018    History of Present Illness 72 year old female, Jehov[employer]'s Witness, with a history of hypertension, diabetes, hyperlipidemia. s/p R hemicolectomy 05/11/18.    PT Comments    Pt is progressing well with mobility, she ambulated 400' holding IV pole, she had mild loss of balance when attempting to walk without holding IV pole. Encouraged pt to use cane or RW at home until she regains her strength and balance.  She is ready to DC home from PT standpoint.   Follow Up Recommendations  No PT follow up     Equipment Recommendations  Other (comment)(4 wheeled RW with seat)    Recommendations for Other Services       Precautions / Restrictions Precautions Precautions: Fall Precaution Comments: abdominal incision, denies falls in past 1 year Restrictions Weight Bearing Restrictions: No    Mobility  Bed Mobility               General bed mobility comments: pt up in recliner  Transfers Overall transfer level: Modified independent Equipment used: None             General transfer comment: used armrests  Ambulation/Gait Ambulation/Gait assistance: Modified independent (Device/Increase time) Gait Distance (Feet): 400 Feet Assistive device: IV Pole Gait Pattern/deviations: WFL(Within Functional Limits) Gait velocity: WFL   General Gait Details: attempted walking without IV pole and pt had mild loss of balance so then used IV pole with no loss of balance   Stairs             Wheelchair Mobility    Modified Rankin (Stroke Patients Only)       Balance Overall balance assessment: Modified Independent                                          Cognition Arousal/Alertness: Awake/alert Behavior During Therapy: WFL for tasks assessed/performed Overall Cognitive Status: Within Functional Limits for tasks assessed                                         Exercises      General Comments        Pertinent Vitals/Pain Pain Score: 4  Pain Location: abdominal incision Pain Descriptors / Indicators: Spasm Pain Intervention(s): Limited activity within patient's tolerance;Monitored during session;Repositioned(instructed pt to brace abdomen with pillow when coughing/sneezing)    Home Living                      Prior Function            PT Goals (current goals can now be found in the care plan section) Acute Rehab PT Goals Patient Stated Goal: [employer] work PT Goal Formulation: With patient Time For Goal Achievement: 05/26/18 Potential to Achieve Goals: Good Progress towards PT goals: Progressing toward goals    Frequency    Min 3X/week      PT Plan Current plan remains appropriate    Co-evaluation              AM-PAC PT "6 Clicks" Daily Activity  Outcome Measure  Difficulty turning over in bed (including adjusting bedclothes, sheets and blankets)?: A Little Difficulty moving from lying on back to sitting  on the side of the bed? : A Little Difficulty sitting down on and standing up from a chair with arms (e.g., wheelchair, bedside commode, etc,.)?: None Help needed moving to and from a bed to chair (including a wheelchair)?: None Help needed walking in hospital room?: A Little Help needed climbing 3-5 steps with a railing? : A Little 6 Click Score: 20    End of Session Equipment Utilized During Treatment: Gait belt Activity Tolerance: Patient tolerated treatment well Patient left: in chair;with call bell/phone within reach Nurse Communication: Mobility status PT Visit Diagnosis: Difficulty in walking, not elsewhere classified (R26.2);Pain     Time: 9030-0923 PT Time Calculation (min) (ACUTE ONLY): 11 min  Charges:  $Gait Training: 8-22 mins                    G Codes:          Philomena Doheny 05/15/2018, 10:36  AM [phone]

## 2018-05-15 NOTE — Progress Notes (Signed)
Discharge instructions reviewed with patient. All questions answered. Patient wheeled to vehicle with belongings by nurse tech. 

## 2018-05-18 ENCOUNTER — Other Ambulatory Visit: Payer: Self-pay

## 2018-05-18 NOTE — Patient Outreach (Signed)
[facility]) Care Management  [place]   05/18/2018  Cindy Griffin [birth year] [id number]   72 year old female referred to [facility] services for 30 day post discharge medication review. PMHx includes, but not limited to, hypertension, Type 2 diabetes mellitus, GERD, dyslipidemia, colon cancer, s/p right hemicolectomy on 05/11/18  Subjective: Cindy Griffin states that she is experiencing some post op pain in which she is taking her tramadol and acetaminophen.  She states that she was not able to "keep anything down" yesterday, but that has not been an issue today.  She states that she is trying to drink water and eat chicken broth to keep from getting dehydrated.  She reports that manages her own medicaitons and fills a weekly pill box.  She states that she does have trouble affording her Anoro Ellipta and that she has been receiving Ozempic samples.  She states that she would not be able to afford the Ozempic.  She states that she has not checked her CBGs in the last couple of days because she "didn't feel good."  Objective:  HgA1c 7.9% on 05/08/18 SCr 1.02 mg/dL on 05/12/18  Current Medications: Current Outpatient Medications  Medication Sig Dispense Refill  . acetaminophen (TYLENOL) 500 MG tablet Take 1,000 mg by mouth every 6 (six) hours as needed for moderate pain or headache.     . albuterol (PROVENTIL HFA;VENTOLIN HFA) 108 (90 Base) MCG/ACT inhaler INHALE 2 PUFFS INTO LUNGS EVERY 6 HOURS AS NEEDED FOR WHEEZING OR SHORTNESS OF BREATH 9 each 0  . Ascorbic Acid (VITAMIN C) 1000 MG tablet Take 1,000 mg by mouth daily.    [name] aspirin EC 81 MG tablet Take 1 tablet (81 mg total) by mouth daily. 90 tablet 1  . atorvastatin (LIPITOR) 80 MG tablet Take 1 tablet (80 mg total) by mouth daily. (Patient taking differently: Take 80 mg by mouth at bedtime. ) 90 tablet 1  . Blood Glucose Monitoring Suppl (ACCU-CHEK NANO SMARTVIEW) W/DEVICE KIT 1 Device by Does not apply route 3  (three) times daily. 1 kit 0  . Carboxymethylcellul-Glycerin (LUBRICATING EYE DROPS OP) Place 2 drops into both eyes 2 (two) times daily as needed (for dry eyes).    . celecoxib (CELEBREX) 200 MG capsule TAKE 1 CAPSULE BY MOUTH ONCE DAILY 90 capsule 1  . cetirizine (ZYRTEC) 10 MG tablet Take 10 mg by mouth daily as needed for allergies.     . Cholecalciferol (VITAMIN D-3) 1000 units CAPS Take 1,000 Units by mouth daily.     . ferrous sulfate 325 (65 FE) MG EC tablet Take 325 mg by mouth daily.     . fluticasone (FLONASE) 50 MCG/ACT nasal spray Place 2 sprays into both nostrils daily. (Patient taking differently: Place 2 sprays into both nostrils daily as needed for allergies. ) 16 g 6  . GLIPIZIDE XL 10 MG 24 hr tablet TAKE 1 TABLET BY MOUTH ONCE DAILY WITH BREAKFAST 90 tablet 0  . glucose blood (ADVOCATE REDI-CODE) test strip 1 each by Other route QID. 100 each 12  . hydrochlorothiazide (HYDRODIURIL) 25 MG tablet Take 1 tablet (25 mg total) by mouth daily. 90 tablet 1  . lisinopril (PRINIVIL,ZESTRIL) 40 MG tablet Take 1 tablet (40 mg total) by mouth daily. (Patient taking differently: Take 40 mg by mouth at bedtime. ) 90 tablet 1  . metFORMIN (GLUCOPHAGE-XR) 750 MG 24 hr tablet Take 1 tablet (750 mg total) by mouth daily with breakfast. 180 tablet 1  .  Multiple Vitamins-Minerals (MULTIVITAMIN PO) Take 1 tablet by mouth daily.    . Omega-3 Fatty Acids (OMEGA-3 CF PO) Take 660 mg by mouth daily. Over the counter product    . pioglitazone (ACTOS) 15 MG tablet Take 1 tablet (15 mg total) by mouth daily. 90 tablet 1  . ranitidine (ZANTAC) 150 MG tablet TAKE 1 TABLET BY MOUTH TWICE DAILY. REPLACES OMEPRAZOLE. (Patient taking differently: Take 150 mg by mouth 2 (two) times daily. ) 180 tablet 1  . Semaglutide (OZEMPIC) 1 MG/DOSE SOPN Inject 0.5-1 mg into the skin once a week. (Patient taking differently: Inject 0.5 mg into the skin every Monday. ) 9 mL 2  . traMADol (ULTRAM) 50 MG tablet Take 1 tablet (50  mg total) by mouth every 6 (six) hours as needed for up to 7 days (postop pain not controlled with tylenol). 30 tablet 0  . umeclidinium-vilanterol (ANORO ELLIPTA) 62.5-25 MCG/INH AEPB Inhale 1 puff into the lungs daily. 60 each 5   No current facility-administered medications for this visit.     Functional Status: In your present state of health, do you have any difficulty performing the following activities: 05/11/2018 05/08/2018  Hearing? N N  Comment - -  Vision? N N  Comment - -  Difficulty concentrating or making decisions? N N  Walking or climbing stairs? Y Y  Comment - knee pain, SOB  Dressing or bathing? N N  Doing errands, shopping? N N  Preparing Food and eating ? - -  Comment - -  Using the Toilet? - -  In the past six months, have you accidently leaked urine? - -  Do you have problems with loss of bowel control? - -  Managing your Medications? - -  Managing your Finances? - -  Housekeeping or managing your Housekeeping? - -  Some recent data might be hidden    Fall/Depression Screening: Fall Risk  03/29/2018 02/24/2018 11/14/2017  Falls in the past year? No No No  Number falls in past yr: - - -  Injury with Fall? - - -  Comment - - -  Risk for fall due to : - Impaired balance/gait;Impaired vision -  Risk for fall due to: Comment - knee pain; arthritis; wears eyeglasses -  Follow up - - -   PHQ 2/9 Scores 02/24/2018 03/14/2017 11/11/2016 07/15/2016 03/15/2016 11/21/2015 08/29/2015  PHQ - 2 Score 1 0 0 0 0 0 1  PHQ- 9 Score 4 - - - - - -   ASSESSMENT: Date Discharged from Hospital: 05/15/18 Date Medication Reconciliation Performed: 05/18/2018  New Medications at Discharge:   tramadol  Patient was recently discharged from hospital and all medications have been reviewed  Drugs sorted by system:  Cardiovascular: aspirin, atorvastatin, hydrochlorothiazide, lisinopril  Pulmonary/Allergy: albuterol, cetirizine, fluticasone, umeclidinium/vilanterol  Gastrointestinal:  ranitidine  Endocrine: glipizide, metformin, pioglitazone, semaglutide   Topical: lubricating eye drop  Pain: acetaminophen, celecoxib, tramadol   Vitamins/Minerals: ascorbic acid, cholecalciferol, ferrous sulfate, MVI, omega 3.  Medication Assistance: Per financial discussion, Ms. Bresee does not qualify for Extra Help LIS.  [employer] reports an out of pocket (OOP) expenditure of $516.25.   [employer] requires $600 OOP to apply for the [employer]   The manufacturer of Ozempic require a $1000 OOP to qualify for their assistance program.  Patient will need to continue with samples for Ozempic.    Plan: Reach out to CPhT, Etter Sjogren to see if we can start application process for Anoro Ellipta from [place].  Joetta Manners, PharmD Clinical Pharmacist [place] [phone]

## 2018-05-19 ENCOUNTER — Encounter: Payer: Self-pay | Admitting: Family Medicine

## 2018-05-19 DIAGNOSIS — Z85038 Personal history of other malignant neoplasm of large intestine: Secondary | ICD-10-CM | POA: Insufficient documentation

## 2018-05-20 ENCOUNTER — Encounter (HOSPITAL_COMMUNITY): Payer: Self-pay

## 2018-05-20 ENCOUNTER — Inpatient Hospital Stay (HOSPITAL_COMMUNITY)
Admission: EM | Admit: 2018-05-20 | Discharge: 2018-05-31 | DRG: 394 | Disposition: A | Discharge status: Home / Self Care | Payer: PPO | Attending: Surgery | Admitting: Surgery

## 2018-05-20 ENCOUNTER — Other Ambulatory Visit: Payer: Self-pay

## 2018-05-20 ENCOUNTER — Emergency Department (HOSPITAL_COMMUNITY): Payer: PPO

## 2018-05-20 DIAGNOSIS — Z90710 Acquired absence of both cervix and uterus: Secondary | ICD-10-CM

## 2018-05-20 DIAGNOSIS — K567 Ileus, unspecified: Secondary | ICD-10-CM | POA: Diagnosis not present

## 2018-05-20 DIAGNOSIS — IMO0002 Reserved for concepts with insufficient information to code with codable children: Secondary | ICD-10-CM | POA: Diagnosis present

## 2018-05-20 DIAGNOSIS — K56609 Unspecified intestinal obstruction, unspecified as to partial versus complete obstruction: Secondary | ICD-10-CM

## 2018-05-20 DIAGNOSIS — Z8542 Personal history of malignant neoplasm of other parts of uterus: Secondary | ICD-10-CM

## 2018-05-20 DIAGNOSIS — E1129 Type 2 diabetes mellitus with other diabetic kidney complication: Secondary | ICD-10-CM | POA: Diagnosis present

## 2018-05-20 DIAGNOSIS — K219 Gastro-esophageal reflux disease without esophagitis: Secondary | ICD-10-CM | POA: Diagnosis present

## 2018-05-20 DIAGNOSIS — M5136 Other intervertebral disc degeneration, lumbar region: Secondary | ICD-10-CM | POA: Diagnosis not present

## 2018-05-20 DIAGNOSIS — Z825 Family history of asthma and other chronic lower respiratory diseases: Secondary | ICD-10-CM

## 2018-05-20 DIAGNOSIS — M1990 Unspecified osteoarthritis, unspecified site: Secondary | ICD-10-CM | POA: Diagnosis present

## 2018-05-20 DIAGNOSIS — Z7982 Long term (current) use of aspirin: Secondary | ICD-10-CM

## 2018-05-20 DIAGNOSIS — C182 Malignant neoplasm of ascending colon: Secondary | ICD-10-CM | POA: Diagnosis present

## 2018-05-20 DIAGNOSIS — Z7984 Long term (current) use of oral hypoglycemic drugs: Secondary | ICD-10-CM

## 2018-05-20 DIAGNOSIS — R112 Nausea with vomiting, unspecified: Secondary | ICD-10-CM | POA: Diagnosis not present

## 2018-05-20 DIAGNOSIS — R6881 Early satiety: Secondary | ICD-10-CM | POA: Diagnosis not present

## 2018-05-20 DIAGNOSIS — Z79899 Other long term (current) drug therapy: Secondary | ICD-10-CM

## 2018-05-20 DIAGNOSIS — Z4659 Encounter for fitting and adjustment of other gastrointestinal appliance and device: Secondary | ICD-10-CM

## 2018-05-20 DIAGNOSIS — R1110 Vomiting, unspecified: Secondary | ICD-10-CM | POA: Diagnosis not present

## 2018-05-20 DIAGNOSIS — Z4682 Encounter for fitting and adjustment of non-vascular catheter: Secondary | ICD-10-CM | POA: Diagnosis not present

## 2018-05-20 DIAGNOSIS — J9811 Atelectasis: Secondary | ICD-10-CM | POA: Diagnosis not present

## 2018-05-20 DIAGNOSIS — M51369 Other intervertebral disc degeneration, lumbar region without mention of lumbar back pain or lower extremity pain: Secondary | ICD-10-CM | POA: Diagnosis present

## 2018-05-20 DIAGNOSIS — K9189 Other postprocedural complications and disorders of digestive system: Secondary | ICD-10-CM | POA: Diagnosis not present

## 2018-05-20 DIAGNOSIS — E785 Hyperlipidemia, unspecified: Secondary | ICD-10-CM | POA: Diagnosis present

## 2018-05-20 DIAGNOSIS — Y836 Removal of other organ (partial) (total) as the cause of abnormal reaction of the patient, or of later complication, without mention of misadventure at the time of the procedure: Secondary | ICD-10-CM | POA: Diagnosis present

## 2018-05-20 DIAGNOSIS — K5900 Constipation, unspecified: Secondary | ICD-10-CM | POA: Diagnosis not present

## 2018-05-20 DIAGNOSIS — G8929 Other chronic pain: Secondary | ICD-10-CM | POA: Diagnosis not present

## 2018-05-20 DIAGNOSIS — G4700 Insomnia, unspecified: Secondary | ICD-10-CM | POA: Diagnosis not present

## 2018-05-20 DIAGNOSIS — Z6841 Body Mass Index (BMI) 40.0 and over, adult: Secondary | ICD-10-CM

## 2018-05-20 DIAGNOSIS — Z8249 Family history of ischemic heart disease and other diseases of the circulatory system: Secondary | ICD-10-CM

## 2018-05-20 DIAGNOSIS — M25551 Pain in right hip: Secondary | ICD-10-CM | POA: Diagnosis not present

## 2018-05-20 DIAGNOSIS — E1165 Type 2 diabetes mellitus with hyperglycemia: Secondary | ICD-10-CM | POA: Diagnosis not present

## 2018-05-20 DIAGNOSIS — R109 Unspecified abdominal pain: Secondary | ICD-10-CM | POA: Diagnosis not present

## 2018-05-20 DIAGNOSIS — K56600 Partial intestinal obstruction, unspecified as to cause: Secondary | ICD-10-CM | POA: Diagnosis present

## 2018-05-20 DIAGNOSIS — Z833 Family history of diabetes mellitus: Secondary | ICD-10-CM | POA: Diagnosis not present

## 2018-05-20 DIAGNOSIS — J449 Chronic obstructive pulmonary disease, unspecified: Secondary | ICD-10-CM | POA: Diagnosis not present

## 2018-05-20 DIAGNOSIS — I10 Essential (primary) hypertension: Secondary | ICD-10-CM | POA: Diagnosis present

## 2018-05-20 DIAGNOSIS — R140 Abdominal distension (gaseous): Secondary | ICD-10-CM | POA: Diagnosis not present

## 2018-05-20 DIAGNOSIS — R52 Pain, unspecified: Secondary | ICD-10-CM

## 2018-05-20 LAB — COMPREHENSIVE METABOLIC PANEL
ALT: 45 U/L (ref 14–54)
AST: 33 U/L (ref 15–41)
Albumin: 3.7 g/dL (ref 3.5–5.0)
Alkaline Phosphatase: 82 U/L (ref 38–126)
Anion gap: 11 (ref 5–15)
BUN: 21 mg/dL — ABNORMAL HIGH (ref 6–20)
CO2: 28 mmol/L (ref 22–32)
Calcium: 9.7 mg/dL (ref 8.9–10.3)
Chloride: 101 mmol/L (ref 101–111)
Creatinine, Ser: 1 mg/dL (ref 0.44–1.00)
GFR calc Af Amer: >60 mL/min (ref >60)
GFR calc non Af Amer: 55 mL/min — ABNORMAL LOW (ref >60)
Glucose, Bld: 176 mg/dL — ABNORMAL HIGH (ref 65–99)
Potassium: 3.9 mmol/L (ref 3.5–5.1)
Sodium: 140 mmol/L (ref 135–145)
Total Bilirubin: 0.8 mg/dL (ref 0.3–1.2)
Total Protein: 7.3 g/dL (ref 6.5–8.1)

## 2018-05-20 LAB — CBC
HCT: 33.9 % — ABNORMAL LOW (ref 36.0–46.0)
Hemoglobin: 11.1 g/dL — ABNORMAL LOW (ref 12.0–15.0)
MCH: 28.9 pg (ref 26.0–34.0)
MCHC: 32.7 g/dL (ref 30.0–36.0)
MCV: 88.3 fL (ref 78.0–100.0)
Platelets: 412 10*3/uL — ABNORMAL HIGH (ref 150–400)
RBC: 3.84 MIL/uL — ABNORMAL LOW (ref 3.87–5.11)
RDW: 14.7 % (ref 11.5–15.5)
WBC: 9.1 10*3/uL (ref 4.0–10.5)

## 2018-05-20 LAB — URINALYSIS, ROUTINE W REFLEX MICROSCOPIC
Bilirubin Urine: NEGATIVE
Glucose, UA: NEGATIVE mg/dL
Hgb urine dipstick: NEGATIVE
Ketones, ur: NEGATIVE mg/dL
Leukocytes, UA: NEGATIVE
Nitrite: NEGATIVE
Protein, ur: NEGATIVE mg/dL
Specific Gravity, Urine: 1.014 (ref 1.005–1.030)
pH: 7 (ref 5.0–8.0)

## 2018-05-20 LAB — GLUCOSE, CAPILLARY
Glucose-Capillary: 137 mg/dL — ABNORMAL HIGH (ref 65–99)
Glucose-Capillary: 134 mg/dL — ABNORMAL HIGH (ref 65–99)

## 2018-05-20 LAB — LIPASE, BLOOD: Lipase: 22 U/L (ref 11–51)

## 2018-05-20 MED ORDER — ENOXAPARIN SODIUM 40 MG/0.4ML ~~LOC~~ SOLN
40.0000 mg | SUBCUTANEOUS | Status: DC
  Administered 2018-05-20 – 2018-05-30 (×11): 40 mg via SUBCUTANEOUS
  Filled 2018-05-20 (×11): qty 0.4

## 2018-05-20 MED ORDER — METHOCARBAMOL 500 MG PO TABS: 500.0000 mg | ORAL_TABLET | Freq: Four times a day (QID) | ORAL | Status: DC | PRN

## 2018-05-20 MED ORDER — IOPAMIDOL (ISOVUE-300) INJECTION 61%
100.0000 mL | Freq: Once | INTRAVENOUS | Status: AC | PRN
  Administered 2018-05-20: 100 mL via INTRAVENOUS

## 2018-05-20 MED ORDER — MORPHINE SULFATE (PF) 4 MG/ML IV SOLN
4.0000 mg | Freq: Once | INTRAVENOUS | Status: AC
  Administered 2018-05-20: 4 mg via INTRAVENOUS
  Filled 2018-05-20: qty 1

## 2018-05-20 MED ORDER — LORATADINE 10 MG PO TABS
10.0000 mg | ORAL_TABLET | Freq: Every day | ORAL | Status: DC
  Administered 2018-05-20 – 2018-05-31 (×12): 10 mg via ORAL
  Filled 2018-05-20 (×12): qty 1

## 2018-05-20 MED ORDER — CELECOXIB 200 MG PO CAPS
200.0000 mg | ORAL_CAPSULE | Freq: Every day | ORAL | Status: DC
  Filled 2018-05-20 (×2): qty 1

## 2018-05-20 MED ORDER — TRAMADOL HCL 50 MG PO TABS: 50.0000 mg | ORAL_TABLET | Freq: Four times a day (QID) | ORAL | Status: DC | PRN

## 2018-05-20 MED ORDER — SODIUM CHLORIDE 0.9 % IV BOLUS
1000.0000 mL | Freq: Once | INTRAVENOUS | Status: AC
  Administered 2018-05-20: 1000 mL via INTRAVENOUS

## 2018-05-20 MED ORDER — ONDANSETRON HCL 4 MG/2ML IJ SOLN
4.0000 mg | Freq: Once | INTRAMUSCULAR | Status: AC
  Administered 2018-05-20: 4 mg via INTRAVENOUS
  Filled 2018-05-20: qty 2

## 2018-05-20 MED ORDER — FLUTICASONE PROPIONATE 50 MCG/ACT NA SUSP
2.0000 | Freq: Every day | NASAL | Status: DC | PRN
  Filled 2018-05-20: qty 16

## 2018-05-20 MED ORDER — UMECLIDINIUM-VILANTEROL 62.5-25 MCG/INH IN AEPB
1.0000 | INHALATION_SPRAY | Freq: Every day | RESPIRATORY_TRACT | Status: DC
  Administered 2018-05-21 – 2018-05-30 (×10): 1 via RESPIRATORY_TRACT
  Filled 2018-05-20 (×2): qty 14

## 2018-05-20 MED ORDER — SIMETHICONE 80 MG PO CHEW
80.0000 mg | CHEWABLE_TABLET | Freq: Four times a day (QID) | ORAL | Status: DC | PRN
  Administered 2018-05-21: 80 mg via ORAL
  Filled 2018-05-20: qty 1

## 2018-05-20 MED ORDER — BISACODYL 10 MG RE SUPP
10.0000 mg | Freq: Every day | RECTAL | Status: DC
  Administered 2018-05-20 – 2018-05-27 (×6): 10 mg via RECTAL
  Filled 2018-05-20 (×8): qty 1

## 2018-05-20 MED ORDER — INSULIN ASPART 100 UNIT/ML ~~LOC~~ SOLN
0.0000 [IU] | SUBCUTANEOUS | Status: DC
  Administered 2018-05-20 – 2018-05-21 (×3): 2 [IU] via SUBCUTANEOUS
  Administered 2018-05-21: 3 [IU] via SUBCUTANEOUS
  Administered 2018-05-21: 2 [IU] via SUBCUTANEOUS
  Administered 2018-05-21: 3 [IU] via SUBCUTANEOUS
  Administered 2018-05-21: 2 [IU] via SUBCUTANEOUS
  Administered 2018-05-21: 3 [IU] via SUBCUTANEOUS
  Administered 2018-05-22: 2 [IU] via SUBCUTANEOUS
  Administered 2018-05-22: 1 [IU] via SUBCUTANEOUS
  Administered 2018-05-23 (×2): 3 [IU] via SUBCUTANEOUS
  Administered 2018-05-23: 8 [IU] via SUBCUTANEOUS
  Administered 2018-05-23: 3 [IU] via SUBCUTANEOUS
  Administered 2018-05-23: 2 [IU] via SUBCUTANEOUS
  Administered 2018-05-23 – 2018-05-24 (×2): 3 [IU] via SUBCUTANEOUS
  Administered 2018-05-24: 5 [IU] via SUBCUTANEOUS
  Administered 2018-05-24 (×2): 3 [IU] via SUBCUTANEOUS

## 2018-05-20 MED ORDER — ALBUTEROL SULFATE (2.5 MG/3ML) 0.083% IN NEBU: 2.5000 mg | INHALATION_SOLUTION | RESPIRATORY_TRACT | Status: DC | PRN

## 2018-05-20 MED ORDER — ACETAMINOPHEN 500 MG PO TABS
1000.0000 mg | ORAL_TABLET | Freq: Four times a day (QID) | ORAL | Status: DC | PRN
  Administered 2018-05-20: 1000 mg via ORAL
  Filled 2018-05-20: qty 2

## 2018-05-20 MED ORDER — OXYCODONE HCL 5 MG PO TABS: 5.0000 mg | ORAL_TABLET | ORAL | Status: DC | PRN

## 2018-05-20 MED ORDER — PROMETHAZINE HCL 25 MG/ML IJ SOLN: 6.2500 mg | Freq: Four times a day (QID) | INTRAMUSCULAR | Status: DC | PRN

## 2018-05-20 MED ORDER — PROCHLORPERAZINE EDISYLATE 10 MG/2ML IJ SOLN
5.0000 mg | Freq: Four times a day (QID) | INTRAMUSCULAR | Status: DC | PRN
  Administered 2018-05-21: 10 mg via INTRAVENOUS
  Filled 2018-05-20: qty 2

## 2018-05-20 MED ORDER — MORPHINE SULFATE (PF) 2 MG/ML IV SOLN
1.0000 mg | INTRAVENOUS | Status: DC | PRN
  Administered 2018-05-20: 1 mg via INTRAVENOUS
  Filled 2018-05-20: qty 1

## 2018-05-20 MED ORDER — PANTOPRAZOLE SODIUM 40 MG PO TBEC
40.0000 mg | DELAYED_RELEASE_TABLET | Freq: Every day | ORAL | Status: DC
  Administered 2018-05-20 – 2018-05-31 (×12): 40 mg via ORAL
  Filled 2018-05-20 (×13): qty 1

## 2018-05-20 MED ORDER — HYDRALAZINE HCL 20 MG/ML IJ SOLN: 10.0000 mg | INTRAMUSCULAR | Status: DC | PRN

## 2018-05-20 MED ORDER — PROCHLORPERAZINE MALEATE 10 MG PO TABS: 10.0000 mg | ORAL_TABLET | Freq: Four times a day (QID) | ORAL | Status: DC | PRN

## 2018-05-20 MED ORDER — POLYVINYL ALCOHOL 1.4 % OP SOLN: 1.0000 [drp] | Freq: Two times a day (BID) | OPHTHALMIC | Status: DC | PRN

## 2018-05-20 MED ORDER — DEXTROSE IN LACTATED RINGERS 5 % IV SOLN
INTRAVENOUS | Status: DC
  Administered 2018-05-20 – 2018-05-21 (×3): via INTRAVENOUS

## 2018-05-20 NOTE — ED Notes (Signed)
Report given to Glori Luis on [facility] for Room 1617.
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
## 2018-05-20 NOTE — Progress Notes (Signed)
Patient had a small amount of bowel movement, after dulcolax supp,

## 2018-05-20 NOTE — ED Triage Notes (Signed)
Patient reports surgery on 05/11/18. Patient c/o vomiting x 2 days. Patient reports that she has been taking Zofran with no relief.

## 2018-05-21 ENCOUNTER — Inpatient Hospital Stay (HOSPITAL_COMMUNITY): Payer: PPO

## 2018-05-21 DIAGNOSIS — C182 Malignant neoplasm of ascending colon: Secondary | ICD-10-CM

## 2018-05-21 LAB — CBC
HCT: 30.6 % — ABNORMAL LOW (ref 36.0–46.0)
Hemoglobin: 9.8 g/dL — ABNORMAL LOW (ref 12.0–15.0)
MCH: 28.3 pg (ref 26.0–34.0)
MCHC: 32 g/dL (ref 30.0–36.0)
MCV: 88.4 fL (ref 78.0–100.0)
Platelets: 360 10*3/uL (ref 150–400)
RBC: 3.46 MIL/uL — ABNORMAL LOW (ref 3.87–5.11)
RDW: 14.9 % (ref 11.5–15.5)
WBC: 7 10*3/uL (ref 4.0–10.5)

## 2018-05-21 LAB — BASIC METABOLIC PANEL
Anion gap: 8 (ref 5–15)
BUN: 11 mg/dL (ref 6–20)
CO2: 27 mmol/L (ref 22–32)
Calcium: 8.9 mg/dL (ref 8.9–10.3)
Chloride: 107 mmol/L (ref 101–111)
Creatinine, Ser: 0.76 mg/dL (ref 0.44–1.00)
GFR calc Af Amer: >60 mL/min (ref >60)
GFR calc non Af Amer: >60 mL/min (ref >60)
Glucose, Bld: 162 mg/dL — ABNORMAL HIGH (ref 65–99)
Potassium: 3.8 mmol/L (ref 3.5–5.1)
Sodium: 142 mmol/L (ref 135–145)

## 2018-05-21 LAB — MAGNESIUM: Magnesium: 1.4 mg/dL — ABNORMAL LOW (ref 1.7–2.4)

## 2018-05-21 LAB — GLUCOSE, CAPILLARY
Glucose-Capillary: 139 mg/dL — ABNORMAL HIGH (ref 65–99)
Glucose-Capillary: 145 mg/dL — ABNORMAL HIGH (ref 65–99)
Glucose-Capillary: 153 mg/dL — ABNORMAL HIGH (ref 65–99)
Glucose-Capillary: 159 mg/dL — ABNORMAL HIGH (ref 65–99)

## 2018-05-21 MED ORDER — HYDRALAZINE HCL 20 MG/ML IJ SOLN: 5.0000 mg | INTRAMUSCULAR | Status: DC | PRN

## 2018-05-21 MED ORDER — DIPHENHYDRAMINE HCL 50 MG/ML IJ SOLN: 12.5000 mg | Freq: Four times a day (QID) | INTRAMUSCULAR | Status: DC | PRN

## 2018-05-21 MED ORDER — LACTATED RINGERS IV SOLN
INTRAVENOUS | Status: AC
Start: 2018-05-21 — End: 2018-05-22
  Administered 2018-05-21 – 2018-05-22 (×3): via INTRAVENOUS

## 2018-05-21 MED ORDER — LIP MEDEX EX OINT
1.0000 "application " | TOPICAL_OINTMENT | Freq: Two times a day (BID) | CUTANEOUS | Status: DC
  Administered 2018-05-21 – 2018-05-31 (×19): 1 via TOPICAL
  Filled 2018-05-21 (×8): qty 7

## 2018-05-21 MED ORDER — GUAIFENESIN-DM 100-10 MG/5ML PO SYRP: 10.0000 mL | ORAL_SOLUTION | ORAL | Status: DC | PRN

## 2018-05-21 MED ORDER — ENSURE SURGERY PO LIQD: 237.0000 mL | Freq: Two times a day (BID) | ORAL | Status: DC

## 2018-05-21 MED ORDER — METOCLOPRAMIDE HCL 5 MG/ML IJ SOLN: 10.0000 mg | Freq: Four times a day (QID) | INTRAMUSCULAR | Status: DC | PRN

## 2018-05-21 MED ORDER — HYDROCORTISONE 2.5 % RE CREA: 1.0000 "application " | TOPICAL_CREAM | Freq: Four times a day (QID) | RECTAL | Status: DC | PRN

## 2018-05-21 MED ORDER — ONDANSETRON HCL 4 MG/2ML IJ SOLN: 4.0000 mg | Freq: Once | INTRAMUSCULAR | Status: DC

## 2018-05-21 MED ORDER — SODIUM CHLORIDE 0.9 % IV SOLN
25.0000 mg | Freq: Four times a day (QID) | INTRAVENOUS | Status: DC | PRN
  Filled 2018-05-21: qty 1

## 2018-05-21 MED ORDER — ACETAMINOPHEN 650 MG RE SUPP
650.0000 mg | Freq: Four times a day (QID) | RECTAL | Status: DC | PRN
  Administered 2018-05-24: 650 mg via RECTAL
  Filled 2018-05-21: qty 1

## 2018-05-21 MED ORDER — ONDANSETRON 4 MG PO TBDP: 4.0000 mg | ORAL_TABLET | Freq: Four times a day (QID) | ORAL | Status: DC | PRN

## 2018-05-21 MED ORDER — ALUM & MAG HYDROXIDE-SIMETH 200-200-20 MG/5ML PO SUSP: 30.0000 mL | Freq: Four times a day (QID) | ORAL | Status: DC | PRN

## 2018-05-21 MED ORDER — PHENOL 1.4 % MT LIQD: 1.0000 | OROMUCOSAL | Status: DC | PRN

## 2018-05-21 MED ORDER — HYDROCORTISONE 1 % EX CREA: 1.0000 "application " | TOPICAL_CREAM | Freq: Three times a day (TID) | CUTANEOUS | Status: DC | PRN

## 2018-05-21 MED ORDER — MENTHOL 3 MG MT LOZG
1.0000 | LOZENGE | OROMUCOSAL | Status: DC | PRN
  Administered 2018-05-22: 3 mg via ORAL
  Filled 2018-05-21 (×2): qty 9

## 2018-05-21 MED ORDER — SODIUM CHLORIDE 0.9 % IV SOLN
8.0000 mg | Freq: Four times a day (QID) | INTRAVENOUS | Status: DC | PRN
  Filled 2018-05-21: qty 4

## 2018-05-21 MED ORDER — MORPHINE SULFATE (PF) 4 MG/ML IV SOLN: 4.0000 mg | Freq: Once | INTRAVENOUS | Status: DC

## 2018-05-21 MED ORDER — SIMETHICONE 80 MG PO CHEW
80.0000 mg | CHEWABLE_TABLET | Freq: Four times a day (QID) | ORAL | Status: AC
  Administered 2018-05-21 – 2018-05-22 (×8): 80 mg via ORAL
  Filled 2018-05-21 (×9): qty 1

## 2018-05-21 MED ORDER — MORPHINE SULFATE (PF) 2 MG/ML IV SOLN
2.0000 mg | INTRAVENOUS | Status: DC | PRN
  Administered 2018-05-21: 2 mg via INTRAVENOUS
  Administered 2018-05-21: 4 mg via INTRAVENOUS
  Filled 2018-05-21 (×2): qty 1
  Filled 2018-05-21: qty 2

## 2018-05-21 MED ORDER — PROCHLORPERAZINE EDISYLATE 10 MG/2ML IJ SOLN: 5.0000 mg | INTRAMUSCULAR | Status: DC | PRN

## 2018-05-21 MED ORDER — METHOCARBAMOL 1000 MG/10ML IJ SOLN
1000.0000 mg | Freq: Four times a day (QID) | INTRAVENOUS | Status: DC | PRN
  Administered 2018-05-25 (×2): 1000 mg via INTRAVENOUS
  Filled 2018-05-21 (×4): qty 10

## 2018-05-21 MED ORDER — ONDANSETRON 4 MG PO TBDP
4.0000 mg | ORAL_TABLET | Freq: Two times a day (BID) | ORAL | Status: AC
  Administered 2018-05-21 – 2018-05-22 (×4): 4 mg via ORAL
  Filled 2018-05-21 (×4): qty 1

## 2018-05-21 MED ORDER — ONDANSETRON HCL 4 MG/2ML IJ SOLN
4.0000 mg | Freq: Four times a day (QID) | INTRAMUSCULAR | Status: DC | PRN
  Administered 2018-05-21 – 2018-05-27 (×4): 4 mg via INTRAVENOUS
  Filled 2018-05-21 (×4): qty 2

## 2018-05-21 MED ORDER — MAGIC MOUTHWASH
15.0000 mL | Freq: Four times a day (QID) | ORAL | Status: DC | PRN
  Filled 2018-05-21: qty 15

## 2018-05-21 MED ORDER — MAGNESIUM SULFATE 4 GM/100ML IV SOLN
4.0000 g | Freq: Once | INTRAVENOUS | Status: AC
  Administered 2018-05-21: 4 g via INTRAVENOUS
  Filled 2018-05-21: qty 100

## 2018-05-21 MED ORDER — PSYLLIUM 95 % PO PACK
1.0000 | PACK | Freq: Every day | ORAL | Status: DC
  Filled 2018-05-21: qty 1

## 2018-05-21 MED ORDER — LACTATED RINGERS IV BOLUS: 1000.0000 mL | Freq: Three times a day (TID) | INTRAVENOUS | Status: AC | PRN

## 2018-05-21 MED ORDER — SODIUM CHLORIDE 0.9 % IV SOLN: 25.0000 mg | Freq: Four times a day (QID) | INTRAVENOUS | Status: DC | PRN

## 2018-05-21 NOTE — Progress Notes (Addendum)
Cindy Griffin [id number] 06/18/1946  CARE TEAM:  PCP: Steele Sizer, MD  Outpatient Care Team: Patient Care Team: Steele Sizer, MD as PCP - General (Family Medicine) Sharlet Salina, MD as Consulting Physician (Physical Medicine and Rehabilitation) Merlene Morse, MD as Consulting Physician (Orthopedic Surgery) Dionne Milo, [facility] as Pharmacist (Pharmacist) Ileana Roup, MD as Consulting Physician (General Surgery)  Inpatient Treatment Team: Treatment Team: Attending Provider: Ileana Roup, MD; Technician: Dayton Scrape, NT; Registered Nurse: Charlesetta Ivory, RN; Technician: Lucas Mallow, NT; Technician: Ardelia Mems, NT; Registered Nurse: Rueben Bash, RN   Problem List:   Principal Problem:   Ileus following gastrointestinal surgery [facility]) Active Problems:   Cancer of ascending colon - pT1N0 (0/21) s/p lap right colectomy 05/11/2018   Benign essential HTN   Gastric reflux   Insomnia   Morbid obesity ([place])   DM (diabetes mellitus), type 2, uncontrolled, with renal complications ([place])   DDD (degenerative disc disease), lumbar   Hypomagnesemia     05/11/2018   POSTOP DIAGNOSIS: Colon polyp  PROCEDURE: Laparoscopic right hemicolectomy  SURGEON: Sharon Mt. White, MD   Hospital Stay = 1 days  Assessment  Ileus after colectomy in pt w many health issues  Plan:  -sips - NGT if worse (she refuses today) -Improve nausea control - standing q12h Zofran.  Additional PRN Zofran.  Compazine and Reglan for breakthrough pain.  Thorazine if more severe with her hiccups.   -improve pain control - no evid perf/leak/abscess/SSI -IVF.  Switch to LR w DM -DM control -HTN control -low Mg - replace -VTE prophylaxis- SCDs, etc -mobilize as tolerated to help recovery  30 minutes spent in review, evaluation, examination, counseling, and coordination of care.  More than 50% of that time was spent in counseling.  Adin Hector, MD, FACS, MASCRS Gastrointestinal and Minimally Invasive Surgery    [address] [place], [place] [address] [phone] Main / Paging [phone] Fax   05/21/2018    Subjective: (Chief complaint)  C/o nausea & retching DOES NOT WANT NGT  Morphine helps w pain  Objective:  Vital signs:  Vitals:   05/20/18 1631 05/20/18 2256 05/21/18 0304 05/21/18 0524  BP: 114/72 (!) 147/68 123/69 104/65  Pulse: 84 92 80 78  Resp: _0 Temp: 98.5 F (36.9 C) 98.4 F (36.9 C) 98.4 F (36.9 C) 98.6 F (37 C)  TempSrc: Oral Oral Oral Oral  SpO2: 100% 96% 97% 97%  Weight:      Height:        Last BM Date: 05/20/18(very small)  Intake/Output   Yesterday:  06/22 0701 - 06/23 0700 In: 2945.8 [I.V.:1945.8; IV Piggyback:1000] Out: 1500 [Urine:1500] This shift:  Total I/O In: -  Out: 700 [Urine:700]  Bowel function:  Flatus: YES - scant  BM:  YES  - small w supp  Drain: (No drain)   Physical Exam:  General: Pt awake/alert/oriented x4 in moderate acute distress Eyes: PERRL, normal EOM.  Sclera clear.  No icterus Neuro: CN II-XII intact w/o focal sensory/motor deficits. Lymph: No head/neck/groin lymphadenopathy Psych:  No delerium/psychosis/paranoia HENT: Normocephalic, Mucus membranes moist.  No thrush Neck: Supple, No tracheal deviation Chest: No chest wall pain w good excursion CV:  Pulses intact.  Regular rhythm MS: Normal AROM mjr joints.  No obvious deformity  Abdomen: Somewhat firm.  Moderately distended.  Diffuse discomfort but no peritonotis.  No evidence of peritonitis.  No incarcerated hernias.  Ext:  No deformity.  No mjr edema.  No cyanosis Skin: No petechiae / purpura  Results:   Labs: Results for orders placed or performed during the hospital encounter of 05/20/18 (from the past 48 hour(s))  Lipase, blood     Status: None   Collection Time: 05/20/18  8:09 AM  Result Value Ref Range   Lipase 22 11 - 51 U/L     Comment: Performed at [facility], [place] [address], [place], [place] [address]  Comprehensive metabolic panel     Status: Abnormal   Collection Time: 05/20/18  8:09 AM  Result Value Ref Range   Sodium 140 135 - 145 mmol/L   Potassium 3.9 3.5 - 5.1 mmol/L   Chloride 101 101 - 111 mmol/L   CO2 28 22 - 32 mmol/L   Glucose, Bld 176 (H) 65 - 99 mg/dL   BUN 21 (H) 6 - 20 mg/dL   Creatinine, Ser 1.00 0.44 - 1.00 mg/dL   Calcium 9.7 8.9 - 10.3 mg/dL   Total Protein 7.3 6.5 - 8.1 g/dL   Albumin 3.7 3.5 - 5.0 g/dL   AST 33 15 - 41 U/L   ALT 45 14 - 54 U/L   Alkaline Phosphatase 82 38 - 126 U/L   Total Bilirubin 0.8 0.3 - 1.2 mg/dL   GFR calc non Af Amer 55 (L) >60 mL/min   GFR calc Af Amer >60 >60 mL/min    Comment: (NOTE) The eGFR has been calculated using the CKD EPI equation. This calculation has not been validated in all clinical situations. eGFR's persistently <60 mL/min signify possible Chronic Kidney Disease.    Anion gap 11 5 - 15    Comment: Performed at [facility], [place] [address] [place], [place] [address]  CBC     Status: Abnormal   Collection Time: 05/20/18  8:09 AM  Result Value Ref Range   WBC 9.1 4.0 - 10.5 K/uL   RBC 3.84 (L) 3.87 - 5.11 MIL/uL   Hemoglobin 11.1 (L) 12.0 - 15.0 g/dL   HCT 33.9 (L) 36.0 - 46.0 %   MCV 88.3 78.0 - 100.0 fL   MCH 28.9 26.0 - 34.0 pg   MCHC 32.7 30.0 - 36.0 g/dL   RDW 14.7 11.5 - 15.5 %   Platelets 412 (H) 150 - 400 K/uL    Comment: Performed at [facility], [place] [address], [place], [place] [address]  Urinalysis, Routine w reflex microscopic     Status: None   Collection Time: 05/20/18 11:56 AM  Result Value Ref Range   Color, Urine YELLOW YELLOW   APPearance CLEAR CLEAR   Specific Gravity, Urine 1.014 1.005 - 1.030   pH 7.0 5.0 - 8.0   Glucose, UA NEGATIVE NEGATIVE mg/dL   Hgb urine dipstick NEGATIVE NEGATIVE   Bilirubin Urine NEGATIVE NEGATIVE   Ketones, ur NEGATIVE  NEGATIVE mg/dL   Protein, ur NEGATIVE NEGATIVE mg/dL   Nitrite NEGATIVE NEGATIVE   Leukocytes, UA NEGATIVE NEGATIVE    Comment: Performed at [facility] [address] [place], [place] [address]  Glucose, capillary     Status: Abnormal   Collection Time: 05/20/18  4:25 PM  Result Value Ref Range   Glucose-Capillary 137 (H) 65 - 99 mg/dL   Comment 1 Notify RN    Comment 2 Document in Chart   Glucose, capillary     Status: Abnormal   Collection Time: 05/20/18  8:06 PM  Result Value Ref Range   Glucose-Capillary 134 (H) 65 - 99 mg/dL   Comment 1 Notify RN    Comment 2 Document in Chart   Glucose, capillary     Status: Abnormal   Collection Time: 05/21/18 12:02 AM  Result Value Ref Range   Glucose-Capillary 139 (H) 65 - 99 mg/dL   Comment 1 Notify RN    Comment 2 Document in Chart   Glucose, capillary     Status: Abnormal   Collection Time: 05/21/18  4:24 AM  Result Value Ref Range   Glucose-Capillary 145 (H) 65 - 99 mg/dL   Comment 1 Notify RN    Comment 2 Document in Chart   Basic metabolic panel     Status: Abnormal   Collection Time: 05/21/18  4:40 AM  Result Value Ref Range   Sodium 142 135 - 145 mmol/L   Potassium 3.8 3.5 - 5.1 mmol/L   Chloride 107 101 - 111 mmol/L   CO2 27 22 - 32 mmol/L   Glucose, Bld 162 (H) 65 - 99 mg/dL   BUN 11 6 - 20 mg/dL   Creatinine, Ser 0.76 0.44 - 1.00 mg/dL   Calcium 8.9 8.9 - 10.3 mg/dL   GFR calc non Af Amer >60 >60 mL/min   GFR calc Af Amer >60 >60 mL/min    Comment: (NOTE) The eGFR has been calculated using the CKD EPI equation. This calculation has not been validated in all clinical situations. eGFR's persistently <60 mL/min signify possible Chronic Kidney Disease.    Anion gap 8 5 - 15    Comment: Performed at [facility], [place], [place] [address] [place], [place] [address]  Magnesium     Status: Abnormal   Collection Time: 05/21/18  4:40 AM  Result Value Ref Range   Magnesium 1.4 (L) 1.7 -  2.4 mg/dL    Comment: Performed at [facility], [place] [address] [place], [place] [address]  CBC     Status: Abnormal   Collection Time: 05/21/18  4:40 AM  Result Value Ref Range   WBC 7.0 4.0 - 10.5 K/uL   RBC 3.46 (L) 3.87 - 5.11 MIL/uL   Hemoglobin 9.8 (L) 12.0 - 15.0 g/dL   HCT 30.6 (L) 36.0 - 46.0 %   MCV 88.4 78.0 - 100.0 fL   MCH 28.3 26.0 - 34.0 pg   MCHC 32.0 30.0 - 36.0 g/dL   RDW 14.9 11.5 - 15.5 %   Platelets 360 150 - 400 K/uL    Comment: Performed at [facility], [place] [address], [place], [place] [address]    Imaging / Studies: Ct Abdomen Pelvis W Contrast  Result Date: 05/20/2018 CLINICAL DATA:  Abdominal pain and vomiting. Laparoscopic right hemicolectomy 9 days ago. EXAM: CT ABDOMEN AND PELVIS WITH CONTRAST TECHNIQUE: Multidetector CT imaging of the abdomen and pelvis was performed using the standard protocol following bolus administration of intravenous contrast. CONTRAST:  189m ISOVUE-300 IOPAMIDOL (ISOVUE-300) INJECTION 61% COMPARISON:  None. FINDINGS: Lower chest: Bilateral lower lobe subsegmental atelectasis. Hepatobiliary: No focal liver abnormality is seen. No gallstones, gallbladder wall thickening, or biliary dilatation. Pancreas: Unremarkable. No pancreatic ductal dilatation or surrounding inflammatory changes. Spleen: Normal in size without focal abnormality. Adrenals/Urinary Tract: Adrenal glands are unremarkable. Kidneys are normal, without renal calculi, focal lesion, or hydronephrosis. Bladder is unremarkable. Stomach/Bowel: Postsurgical changes related to right hemicolectomy. Small bowel dilatation to the level of the ileocolic anastomosis. The stomach is within normal limits. Mild sigmoid  diverticulosis. Vascular/Lymphatic: No significant vascular findings are present. No enlarged abdominal or pelvic lymph nodes. Reproductive: Status post hysterectomy. No adnexal masses. Other: Small amount of free fluid in the pelvis, right  paracolic gutter, and adjacent to the spleen. No pneumoperitoneum. Musculoskeletal: No acute or significant osseous findings. Postsurgical changes in the anterior abdominal wall. IMPRESSION: 1. Postsurgical changes related to right hemicolectomy with diffuse small bowel dilatation to the level of the ileocolic anastomosis. Findings may reflect ileus or partial small bowel obstruction at the anastomosis. 2. Small ascites. Electronically Signed   By: Titus Dubin M.D.   On: 05/20/2018 10:23   Dg Abd Portable 2v  Result Date: 05/21/2018 CLINICAL DATA:  Post right colectomy 05/11/2018 with ileus versus obstruction. EXAM: PORTABLE ABDOMEN - 2 VIEW COMPARISON:  CT 05/20/2018 FINDINGS: Examination demonstrates multiple air-filled dilated central small bowel loops. These measure up to 4.5 cm in diameter. No evidence of free peritoneal air. Air is present within the colon. Mild degenerate change of the spine and hips. IMPRESSION: Continued air-filled dilated small bowel loops in the central abdomen. Findings may be due to ileus versus early/partial mechanical small-bowel obstruction. Electronically Signed   By: Marin Olp M.D.   On: 05/21/2018 07:21    Medications / Allergies: per chart  Antibiotics: Anti-infectives (From admission, onward)   None        Note: Portions of this report may have been transcribed using voice recognition software. Every effort was made to ensure accuracy; however, inadvertent computerized transcription errors may be present.   Any transcriptional errors that result from this process are unintentional.     Adin Hector, MD, FACS, MASCRS Gastrointestinal and Minimally Invasive Surgery    [address], [place], [place] [address] [phone] Main / Paging [phone] Fax

## 2018-05-22 ENCOUNTER — Inpatient Hospital Stay: Payer: Self-pay

## 2018-05-22 ENCOUNTER — Inpatient Hospital Stay (HOSPITAL_COMMUNITY): Payer: PPO

## 2018-05-22 DIAGNOSIS — K56609 Unspecified intestinal obstruction, unspecified as to partial versus complete obstruction: Secondary | ICD-10-CM

## 2018-05-22 LAB — CBC
HCT: 30.6 % — ABNORMAL LOW (ref 36.0–46.0)
Hemoglobin: 9.9 g/dL — ABNORMAL LOW (ref 12.0–15.0)
MCH: 28.6 pg (ref 26.0–34.0)
MCHC: 32.4 g/dL (ref 30.0–36.0)
MCV: 88.4 fL (ref 78.0–100.0)
Platelets: 369 10*3/uL (ref 150–400)
RBC: 3.46 MIL/uL — ABNORMAL LOW (ref 3.87–5.11)
RDW: 14.7 % (ref 11.5–15.5)
WBC: 8.7 10*3/uL (ref 4.0–10.5)

## 2018-05-22 LAB — GLUCOSE, CAPILLARY
Glucose-Capillary: 101 mg/dL — ABNORMAL HIGH (ref 65–99)
Glucose-Capillary: 126 mg/dL — ABNORMAL HIGH (ref 65–99)
Glucose-Capillary: 141 mg/dL — ABNORMAL HIGH (ref 65–99)
Glucose-Capillary: 113 mg/dL — ABNORMAL HIGH (ref 65–99)
Glucose-Capillary: 121 mg/dL — ABNORMAL HIGH (ref 65–99)
Glucose-Capillary: 109 mg/dL — ABNORMAL HIGH (ref 65–99)

## 2018-05-22 LAB — BASIC METABOLIC PANEL
Anion gap: 10 (ref 5–15)
BUN: 8 mg/dL (ref 6–20)
CO2: 24 mmol/L (ref 22–32)
Calcium: 8.7 mg/dL — ABNORMAL LOW (ref 8.9–10.3)
Chloride: 106 mmol/L (ref 101–111)
Creatinine, Ser: 0.78 mg/dL (ref 0.44–1.00)
GFR calc Af Amer: >60 mL/min (ref >60)
GFR calc non Af Amer: >60 mL/min (ref >60)
Glucose, Bld: 130 mg/dL — ABNORMAL HIGH (ref 65–99)
Potassium: 3.8 mmol/L (ref 3.5–5.1)
Sodium: 140 mmol/L (ref 135–145)

## 2018-05-22 LAB — MAGNESIUM: Magnesium: 1.7 mg/dL (ref 1.7–2.4)

## 2018-05-22 LAB — PREALBUMIN: Prealbumin: 18.6 mg/dL (ref 18–38)

## 2018-05-22 LAB — TRIGLYCERIDES: Triglycerides: 59 mg/dL (ref <150)

## 2018-05-22 LAB — PHOSPHORUS: Phosphorus: 3.6 mg/dL (ref 2.5–4.6)

## 2018-05-22 MED ORDER — SIMETHICONE 40 MG/0.6ML PO SUSP: 40.0000 mg | Freq: Four times a day (QID) | ORAL | Status: DC | PRN

## 2018-05-22 MED ORDER — LORAZEPAM 2 MG/ML IJ SOLN
0.5000 mg | Freq: Three times a day (TID) | INTRAMUSCULAR | Status: DC | PRN
  Administered 2018-05-22: 0.5 mg via INTRAVENOUS

## 2018-05-22 MED ORDER — HYDROMORPHONE HCL 1 MG/ML IJ SOLN
0.5000 mg | INTRAMUSCULAR | Status: DC | PRN
  Administered 2018-05-22 (×2): 1 mg via INTRAVENOUS
  Administered 2018-05-22: 2 mg via INTRAVENOUS
  Administered 2018-05-22 – 2018-05-23 (×3): 1 mg via INTRAVENOUS
  Administered 2018-05-23 (×2): 2 mg via INTRAVENOUS
  Administered 2018-05-23 – 2018-05-24 (×4): 1 mg via INTRAVENOUS
  Administered 2018-05-24: 2 mg via INTRAVENOUS
  Administered 2018-05-24 – 2018-05-26 (×9): 1 mg via INTRAVENOUS
  Filled 2018-05-22 (×6): qty 1
  Filled 2018-05-22: qty 2
  Filled 2018-05-22 (×3): qty 1
  Filled 2018-05-22: qty 2
  Filled 2018-05-22 (×3): qty 1
  Filled 2018-05-22: qty 2
  Filled 2018-05-22 (×2): qty 1
  Filled 2018-05-22 (×2): qty 2
  Filled 2018-05-22 (×4): qty 1

## 2018-05-22 MED ORDER — LACTATED RINGERS IV SOLN: INTRAVENOUS | Status: AC

## 2018-05-22 MED ORDER — SODIUM CHLORIDE 0.9% FLUSH
10.0000 mL | Freq: Two times a day (BID) | INTRAVENOUS | Status: DC
  Administered 2018-05-22 – 2018-05-24 (×5): 10 mL
  Administered 2018-05-25: 20 mL
  Administered 2018-05-28 – 2018-05-30 (×4): 10 mL

## 2018-05-22 MED ORDER — LORAZEPAM 2 MG/ML IJ SOLN
INTRAMUSCULAR | Status: AC
  Filled 2018-05-22: qty 1

## 2018-05-22 MED ORDER — MAGNESIUM SULFATE 2 GM/50ML IV SOLN
2.0000 g | Freq: Once | INTRAVENOUS | Status: AC
  Administered 2018-05-22: 2 g via INTRAVENOUS
  Filled 2018-05-22 (×2): qty 50

## 2018-05-22 MED ORDER — TRACE MINERALS CR-CU-MN-SE-ZN 10-1000-500-60 MCG/ML IV SOLN
INTRAVENOUS | Status: AC
  Administered 2018-05-22: 18:00:00 via INTRAVENOUS
  Filled 2018-05-22: qty 960

## 2018-05-22 MED ORDER — FAT EMULSION PLANT BASED 20 % IV EMUL
240.0000 mL | INTRAVENOUS | Status: AC
  Administered 2018-05-22: 240 mL via INTRAVENOUS
  Filled 2018-05-22: qty 250

## 2018-05-22 MED ORDER — SODIUM CHLORIDE 0.9% FLUSH
10.0000 mL | INTRAVENOUS | Status: DC | PRN
  Administered 2018-05-22: 10 mL
  Filled 2018-05-22: qty 40

## 2018-05-22 NOTE — Progress Notes (Signed)
Initial Nutrition Assessment  DOCUMENTATION CODES:   Morbid obesity  INTERVENTION:    Monitor for diet advancement/toleration  TPN per pharmacy  NUTRITION DIAGNOSIS:   Inadequate oral intake related to altered GI function, nausea, vomiting as evidenced by per patient/family report.  GOAL:   Patient will meet greater than or equal to 90% of their needs  MONITOR:   Diet advancement, Labs, Weight trends, I & O's  REASON FOR ASSESSMENT:   Consult New TPN/TNA  ASSESSMENT:   Patient with PMH significant for COPD, DM, HLD, and HTN. Recently discharged 6/17 after a laparscopic right hemicolectomy on 6/13.  At the time of discharge, she was passing gas, having bowel movements, eating a soft diet, and not having n/v. Presents this admission with nausea/vomiting. Found to have an ileus vs partial obstruction at anastomosis.    Pt reports appetite has not increased s/p surgery. States she would force herself to eat small bites of foods like jello, broth, and mashed potatoes at home. She began vomiting four days ago and did not consume anything during that time period. She has an NGT to suction and is NPO. RD observed 200 ml of brownish yellow output in NG canister. RD consulted for recommendations for TPN. Pt does not have a PICC in place at this time. Discussed possible supplements options to use simultaneously with TPN. Will check back once nausea has subsided and diet is advanced.   Pt endorses a UBW of 240 lb and an unknown amount of wt loss. Records indicate pt has maintained her weight of 233-236 lb since the beginning of the year. Nutrition-Focused physical exam completed.   RD TPN Recommendations: Clinimix 5/15 @ 70 ml/hr + 20% ILE @ 20 ml/hr x 12 hrs to provide 1673 kcal and 84 g protein. Meets 100% of needs.   Medications reviewed and include: 2 g Mg sulfate Labs reviewed.   NUTRITION - FOCUSED PHYSICAL EXAM:    Most Recent Value  Orbital Region  No depletion  Upper Arm  Region  No depletion  Thoracic and Lumbar Region  Unable to assess  Buccal Region  No depletion  Temple Region  No depletion  Clavicle Bone Region  No depletion  Clavicle and Acromion Bone Region  No depletion  Scapular Bone Region  Unable to assess  Dorsal Hand  No depletion  Patellar Region  No depletion  Anterior Thigh Region  No depletion  Posterior Calf Region  No depletion  Edema (RD Assessment)  Mild  Hair  Reviewed  Eyes  Reviewed  Mouth  Reviewed  Skin  Reviewed  Nails  Reviewed     Diet Order:   Diet Order           Diet NPO time specified Except for: Ice Chips  Diet effective now          EDUCATION NEEDS:   Education needs have been addressed  Skin:  Skin Assessment: Skin Integrity Issues: Skin Integrity Issues:: Incisions Incisions: abdomen  Last BM:  05/20/18  Height:   Ht Readings from Last 1 Encounters:  05/20/18 5\' 4"  (1.626 m)    Weight:   Wt Readings from Last 1 Encounters:  05/20/18 236 lb 8.9 oz (107.3 kg)    Ideal Body Weight:  54.5 kg  BMI:  Body mass index is 40.6 kg/m.  Estimated Nutritional Needs:   Kcal:  1600-1800 kcal  Protein:  80-90 g  Fluid:  >1.6 L/day    Mariana Single RD, LDN Clinical Nutrition Pager # -  [phone]  

## 2018-05-22 NOTE — Progress Notes (Signed)
Peripherally Inserted Central Catheter/Midline Placement  The IV Nurse has discussed with the patient and/or persons authorized to consent for the patient, the purpose of this procedure and the potential benefits and risks involved with this procedure.  The benefits include less needle sticks, lab draws from the catheter, and the patient may be discharged home with the catheter. Risks include, but not limited to, infection, bleeding, blood clot (thrombus formation), and puncture of an artery; nerve damage and irregular heartbeat and possibility to perform a PICC exchange if needed/ordered by physician.  Alternatives to this procedure were also discussed.  Bard Power PICC patient education guide, fact sheet on infection prevention and patient information card has been provided to patient /or left at bedside.    PICC/Midline Placement Documentation        Darlyn Read 05/22/2018, 12:46 PM

## 2018-05-22 NOTE — Progress Notes (Signed)
Subjective Doing reasonably well today. Had NGT placed overnight for n/v. Denies abdominal pain. No flatus/BM since admission  Objective: Vital signs in last 24 hours: Temp:  [98.1 F (36.7 C)-99 F (37.2 C)] 98.1 F (36.7 C) (06/24 6384) Pulse Rate:  [80-100] 80 (06/24 0613) Resp:  [16] 16 (06/24 0613) BP: (125-147)/(61-114) 136/69 (06/24 0613) SpO2:  [92 %-97 %] 92 % (06/24 0613) Last BM Date: 05/20/18(very small)  Intake/Output from previous day: 06/23 0701 - 06/24 0700 In: 1432.5 [P.O.:960; I.V.:472.5] Out: 1900 [Urine:1900] Intake/Output this shift: No intake/output data recorded.  Gen: NAD, comfortable with NGT in place CV: RRR Pulm: Normal work of breathing Abd: Soft, nontender, not significantly distended at this time Ext: SCDs in place  Lab Results: CBC  Recent Labs    05/21/18 0440 05/22/18 0539  WBC 7.0 8.7  HGB 9.8* 9.9*  HCT 30.6* 30.6*  PLT 360 369   BMET Recent Labs    05/21/18 0440 05/22/18 0539  NA 142 140  K 3.8 3.8  CL 107 106  CO2 27 24  GLUCOSE 162* 130*  BUN 11 8  CREATININE 0.76 0.78  CALCIUM 8.9 8.7*   PT/INR No results for input(s): LABPROT, INR in the last 72 hours. ABG No results for input(s): PHART, HCO3 in the last 72 hours.  Invalid input(s): PCO2, PO2  Studies/Results:  Anti-infectives: Anti-infectives (From admission, onward)   None       Assessment/Plan: Patient Active Problem List   Diagnosis Date Noted  . SBO (small bowel obstruction) and ileocolonic anastomosis from recent colectomy 05/22/2018  . Cancer of ascending colon - pT1N0 (0/21) s/p lap right colectomy 05/11/2018 05/21/2018  . Hypomagnesemia 05/21/2018  . Ileus following gastrointestinal surgery (Cindy Griffin) 05/20/2018  . Essential hemorrhagic thrombocythemia (Cindy Griffin) 03/29/2018  . Hypercalcemia 11/23/2015  . DDD (degenerative disc disease), lumbar 07/28/2015  . Spinal stenosis at L4-L5 level 06/12/2015  . Benign essential HTN 05/25/2015  . Bunion  05/25/2015  . Cardiac enlargement 05/25/2015  . Cervical radicular pain 05/25/2015  . Back pain, chronic 05/25/2015  . Osteoarthritis 05/25/2015  . Dyslipidemia 05/25/2015  . Gastric reflux 05/25/2015  . Insomnia 05/25/2015  . Eczema intertrigo 05/25/2015  . Bronchitis, chronic, mucopurulent (Cindy Griffin) 05/25/2015  . Numerous moles 05/25/2015  . Morbid obesity (Cindy Griffin) 05/25/2015  . Perennial allergic rhinitis 05/25/2015  . DM (diabetes mellitus), type 2, uncontrolled, with renal complications (Cindy Griffin) 66/59/9357  . Snores 05/25/2015  . History of artificial joint 05/25/2015  . Degeneration of intervertebral disc of cervical region 02/06/2015  . Neuritis or radiculitis due to rupture of lumbar intervertebral disc 01/14/2015  . Abnormal presence of protein in urine 05/04/2010  . Vitamin D deficiency 05/04/2010   Cindy Griffin is a pleasant 38yoF with hx of HTN, DM, HLD s/p laparoscopic right hemicolectomy 05/11/18 for colon cancer - T1N0 (0/21 LNs) readmitted with most likely prolonged postoperative ileus POD#11  -NPO, NGT, MIVF -PICC/TPN today -Awaiting resolution and return of bowel function -DM - insulin -Hypomagnesemia - replace today with 2g IV mag -Ambulate 5x/day -PPx: Lov, SCDs, PPI   LOS: 2 days   Cindy Griffin, M.D. General and Colorectal Surgery [facility]

## 2018-05-22 NOTE — Progress Notes (Signed)
PHARMACY - ADULT TOTAL PARENTERAL NUTRITION CONSULT NOTE   Pharmacy Consult for TPN Indication: prolonged ileus  Patient Measurements: Body mass index is 40.6 kg/m. Filed Weights   05/20/18 0806 05/20/18 1327  Weight: 250 lb (113.4 kg) 236 lb 8.9 oz (107.3 kg)  Usual weight:  240 lbs  HPI: 88 yoF re-admitted on 6/22 s/p laparoscopic right hemicolectomy 05/11/18 for colon cancer.  Re-admitted with likely prolonged postoperative ileus, vs partial obstruction at anastamosis.  Pharmacy is consulted for TPN.  Significant events:  6/13 lap right colectomy 05/11/2018  Insulin requirements past 24 hours: used 13 units / 24 hrs moderate SSI (hx DM on PTA Glipizide ER 10mg  daily,  Metformin XR 750 mg daily, Actos 15 mg daily, Semaglutide 0.5 mg qMondays)  Current Nutrition: NPO NG tube to suction  IVF: LR at 75 ml/hr  Central access:   PICC line ordered TPN start date:  6/24 pending PICC line placment  ASSESSMENT                                                                                                          Today:   Glucose:  Most within goal (100-150), with one high at 159.  Range 101-159.  Electrolytes:  WNL including Na, K, Cl, Mag, Phos  Renal:  SCr 0.78, BUN 8  LFTs:  Last WNL on 6/22  TGs:  59 (6/24)  Prealbumin:  18.6 (6/24)   NUTRITIONAL GOALS                                                                                             RD recs (6/24):  80-90 g Protein, 1600-1800 Kcal Clinimix 5/15 at a goal rate of 75 ml/hr + 20% fat emulsion 240 ml/day to provide: 90 g of protein and 1758 kCals per day meeting 100 % of protein and 100 % of kCal needs   PLAN                                                                                                                         Now: Magnesium 2g IV per MD   At 1800 today:  Start Clinimix E 5/15  at 40 ml/hr.  20% fat emulsion at 20 ml/hr x12 hours/day  Plan to advance as tolerated to the goal rate.  TPN to  contain standard multivitamins and trace element daily.  Reduce IVF to 35 ml/hr.  Continue q4h CBGs and moderate SSI q4h.   TPN lab panels on Mondays & Thursdays.  Additional labs: Bmet, Mag, Phos daily x3 days  Gretta Arab PharmD, BCPS Pager [phone] 05/22/2018 8:34 AM

## 2018-05-23 ENCOUNTER — Inpatient Hospital Stay (HOSPITAL_COMMUNITY): Payer: PPO

## 2018-05-23 ENCOUNTER — Other Ambulatory Visit: Payer: Self-pay | Admitting: Pharmacy Technician

## 2018-05-23 LAB — COMPREHENSIVE METABOLIC PANEL
ALT: 22 U/L (ref 0–44)
AST: 14 U/L — ABNORMAL LOW (ref 15–41)
Albumin: 2.9 g/dL — ABNORMAL LOW (ref 3.5–5.0)
Alkaline Phosphatase: 69 U/L (ref 38–126)
Anion gap: 6 (ref 5–15)
BUN: 10 mg/dL (ref 8–23)
CO2: 28 mmol/L (ref 22–32)
Calcium: 8.6 mg/dL — ABNORMAL LOW (ref 8.9–10.3)
Chloride: 106 mmol/L (ref 98–111)
Creatinine, Ser: 0.7 mg/dL (ref 0.44–1.00)
GFR calc Af Amer: >60 mL/min (ref >60)
GFR calc non Af Amer: >60 mL/min (ref >60)
Glucose, Bld: 168 mg/dL — ABNORMAL HIGH (ref 70–99)
Potassium: 3.7 mmol/L (ref 3.5–5.1)
Sodium: 140 mmol/L (ref 135–145)
Total Bilirubin: 0.5 mg/dL (ref 0.3–1.2)
Total Protein: 6 g/dL — ABNORMAL LOW (ref 6.5–8.1)

## 2018-05-23 LAB — CBC
HCT: 29.3 % — ABNORMAL LOW (ref 36.0–46.0)
Hemoglobin: 9.3 g/dL — ABNORMAL LOW (ref 12.0–15.0)
MCH: 27.8 pg (ref 26.0–34.0)
MCHC: 31.7 g/dL (ref 30.0–36.0)
MCV: 87.7 fL (ref 78.0–100.0)
Platelets: 380 10*3/uL (ref 150–400)
RBC: 3.34 MIL/uL — ABNORMAL LOW (ref 3.87–5.11)
RDW: 14.6 % (ref 11.5–15.5)
WBC: 10 10*3/uL (ref 4.0–10.5)

## 2018-05-23 LAB — GLUCOSE, CAPILLARY
Glucose-Capillary: 142 mg/dL — ABNORMAL HIGH (ref 70–99)
Glucose-Capillary: 151 mg/dL — ABNORMAL HIGH (ref 70–99)
Glucose-Capillary: 171 mg/dL — ABNORMAL HIGH (ref 70–99)
Glucose-Capillary: 179 mg/dL — ABNORMAL HIGH (ref 70–99)
Glucose-Capillary: 180 mg/dL — ABNORMAL HIGH (ref 70–99)
Glucose-Capillary: 185 mg/dL — ABNORMAL HIGH (ref 70–99)

## 2018-05-23 LAB — MAGNESIUM: Magnesium: 1.7 mg/dL (ref 1.7–2.4)

## 2018-05-23 LAB — DIFFERENTIAL
Basophils Absolute: 0 10*3/uL (ref 0.0–0.1)
Basophils Relative: 0 %
Eosinophils Absolute: 0.2 10*3/uL (ref 0.0–0.7)
Eosinophils Relative: 2 %
Lymphocytes Relative: 19 %
Lymphs Abs: 1.9 10*3/uL (ref 0.7–4.0)
Monocytes Absolute: 0.7 10*3/uL (ref 0.1–1.0)
Monocytes Relative: 7 %
Neutro Abs: 7.2 10*3/uL (ref 1.7–7.7)
Neutrophils Relative %: 72 %

## 2018-05-23 LAB — PHOSPHORUS: Phosphorus: 3.3 mg/dL (ref 2.5–4.6)

## 2018-05-23 MED ORDER — FAT EMULSION PLANT BASED 20 % IV EMUL
240.0000 mL | INTRAVENOUS | Status: AC
  Administered 2018-05-23: 240 mL via INTRAVENOUS
  Filled 2018-05-23: qty 250

## 2018-05-23 MED ORDER — TRACE MINERALS CR-CU-MN-SE-ZN 10-1000-500-60 MCG/ML IV SOLN
INTRAVENOUS | Status: AC
  Administered 2018-05-23: 19:00:00 via INTRAVENOUS
  Filled 2018-05-23: qty 1800

## 2018-05-23 MED ORDER — LACTATED RINGERS IV SOLN
INTRAVENOUS | Status: DC
  Administered 2018-05-24: 20:00:00 via INTRAVENOUS

## 2018-05-23 MED ORDER — CYCLOBENZAPRINE HCL 10 MG PO TABS
10.0000 mg | ORAL_TABLET | Freq: Once | ORAL | Status: AC
  Administered 2018-05-23: 10 mg via ORAL
  Filled 2018-05-23: qty 1

## 2018-05-23 MED ORDER — MAGNESIUM SULFATE 2 GM/50ML IV SOLN
2.0000 g | Freq: Once | INTRAVENOUS | Status: AC
  Administered 2018-05-23: 2 g via INTRAVENOUS
  Filled 2018-05-23: qty 50

## 2018-05-23 MED ORDER — POTASSIUM CHLORIDE 10 MEQ/100ML IV SOLN
10.0000 meq | INTRAVENOUS | Status: AC
  Administered 2018-05-23 (×4): 10 meq via INTRAVENOUS
  Filled 2018-05-23 (×2): qty 100

## 2018-05-23 NOTE — Care Management Important Message (Signed)
Important Message  Patient Details  Name: Cindy Griffin MRN: [id number] Date of Birth: 28-Mar-1946   Medicare Important Message Given:  Yes    Kerin Salen 05/23/2018, 12:22 [place] Message  Patient Details  Name: Cindy Griffin MRN: [id number] Date of Birth: Jan 03, 1946   Medicare Important Message Given:  Yes    Kerin Salen 05/23/2018, 12:22 PM

## 2018-05-23 NOTE — Progress Notes (Signed)
PHARMACY - ADULT TOTAL PARENTERAL NUTRITION CONSULT NOTE   Pharmacy Consult for TPN Indication: prolonged ileus  Patient Measurements: Body mass index is 40.6 kg/m. Filed Weights   05/20/18 0806 05/20/18 1327  Weight: 250 lb (113.4 kg) 236 lb 8.9 oz (107.3 kg)  Usual weight:  240 lbs  HPI: 56 yoF re-admitted on 6/22 s/p laparoscopic right hemicolectomy 05/11/18 for colon cancer.  Re-admitted with likely prolonged postoperative ileus, vs partial obstruction at anastamosis.  Pharmacy is consulted for TPN.  Significant events:  6/13 lap right colectomy 05/11/2018  Insulin requirements past 24 hours: used 8 units / 24 hrs moderate SSI (hx DM on PTA Glipizide ER 10mg  daily,  Metformin XR 750 mg daily, Actos 15 mg daily, Semaglutide 0.5 mg qMondays)  Current Nutrition: NPO NG tube to suction with low output.  Clamped on 6/25 AM and will be removed if tolerating.  IVF: LR at 35 ml/hr  Central access:   PICC line 6/24 TPN start date:  6/24  ASSESSMENT                                                                                                          Today:   Glucose:  Most within goal (100-150) range, with one high at 180.  Range 109-180.  Electrolytes:  WNL including Na, K (not at goal >4), Cl, Mag (not at goal >2), Phos  Renal:  SCr 0.7, BUN 10  LFTs:  WNL (6/25)  TGs:  59 (6/24)  Prealbumin:  18.6 (6/24)   NUTRITIONAL GOALS                                                                                             RD recs (6/24):  80-90 g Protein, 1600-1800 Kcal Clinimix 5/15 at a goal rate of 75 ml/hr + 20% fat emulsion 240 ml/day to provide: 90 g of protein and 1758 kCals per day meeting 100 % of protein and 100 % of kCal needs   PLAN  Now: Magnesium 2g IV x1 dose KCl 10 mEq IV x 4 runs  At 1800 today:  Increase to Clinimix E 5/15 at 75  ml/hr.  20% fat emulsion at 20 ml/hr x12 hours/day  TPN to contain standard multivitamins and trace element daily.  Reduce IVF to 10 ml/hr.  Continue q4h CBGs and moderate SSI q4h.   TPN lab panels on Mondays & Thursdays.  Additional labs: Bmet, Mag, Phos daily x3 days  Gretta Arab PharmD, BCPS Pager [phone] 05/23/2018 7:41 AM

## 2018-05-23 NOTE — Patient Outreach (Signed)
[facility]) Care Management  05/23/2018  Cindy Griffin 1946/09/14 [id number]   Received [place] patient assistance referral from [place] for [employer]. Prepared patient portion to be mailed, faxed provider portion to Dr. Bayard Males.  Will follow up with patient in 5-7 business days to confirm application has been received.  [name] [place], [place] [facility] [phone]

## 2018-05-23 NOTE — Progress Notes (Signed)
I notified Dr Georgiann Cocker of the pain c/o on Cindy Griffin "excruciating  Pain" in her  Low back, rt postior abdominal side and rt hip. Pt stated the pain " was just awful.I gave her dilaudid 1 mg IV.Dr Kae Heller gave me a VO for abd xray 1 view and reconnect NG through the night Her NG has been clamped since 0900 this am with no nausea or vomiting

## 2018-05-23 NOTE — Progress Notes (Signed)
Late entry from 0800 this morning  Subjective Doing reasonably well today. Had PICC placed yesterday and TPN started. Denies abdominal pain. Is now passing gas and had a small BM yesterday. Denies n/v with NG tube. Tube has put out 300cc/24hrs. Ambulated once yesterday.  Objective: Vital signs in last 24 hours: Temp:  [98.2 F (36.8 C)-98.6 F (37 C)] 98.6 F (37 C) (06/24 2320) Pulse Rate:  [72-81] 81 (06/24 2320) Resp:  [16] 16 (06/24 2320) BP: (144-145)/(74-83) 145/83 (06/24 2320) SpO2:  [92 %-95 %] 92 % (06/25 0831) Last BM Date: 05/22/18  Intake/Output from previous day: 06/24 0701 - 06/25 0700 In: 730.7 [I.V.:730.7] Out: 301 [Urine:1; Emesis/NG output:300] Intake/Output this shift: No intake/output data recorded.  Gen: NAD, comfortable with NGT in place CV: RRR Pulm: Normal work of breathing Abd: Soft, nontender, not significantly distended at this time. Incisions c/d/i without erythema Ext: SCDs in place  Lab Results: CBC  Recent Labs    05/22/18 0539 05/23/18 0442  WBC 8.7 10.0  HGB 9.9* 9.3*  HCT 30.6* 29.3*  PLT 369 380   BMET Recent Labs    05/22/18 0539 05/23/18 0442  NA 140 140  K 3.8 3.7  CL 106 106  CO2 24 28  GLUCOSE 130* 168*  BUN 8 10  CREATININE 0.78 0.70  CALCIUM 8.7* 8.6*   PT/INR No results for input(s): LABPROT, INR in the last 72 hours. ABG No results for input(s): PHART, HCO3 in the last 72 hours.  Invalid input(s): PCO2, PO2  Studies/Results:  Anti-infectives: Anti-infectives (From admission, onward)   None       Assessment/Plan: Patient Active Problem List   Diagnosis Date Noted  . SBO (small bowel obstruction) and ileocolonic anastomosis from recent colectomy 05/22/2018  . Cancer of ascending colon - pT1N0 (0/21) s/p lap right colectomy 05/11/2018 05/21/2018  . Hypomagnesemia 05/21/2018  . Ileus following gastrointestinal surgery ([place]) 05/20/2018  . Essential hemorrhagic thrombocythemia ([place]) 03/29/2018  .  Hypercalcemia 11/23/2015  . DDD (degenerative disc disease), lumbar 07/28/2015  . Spinal stenosis at L4-L5 level 06/12/2015  . Benign essential HTN 05/25/2015  . Bunion 05/25/2015  . Cardiac enlargement 05/25/2015  . Cervical radicular pain 05/25/2015  . Back pain, chronic 05/25/2015  . Osteoarthritis 05/25/2015  . Dyslipidemia 05/25/2015  . Gastric reflux 05/25/2015  . Insomnia 05/25/2015  . Eczema intertrigo 05/25/2015  . Bronchitis, chronic, mucopurulent ([place]) 05/25/2015  . Numerous moles 05/25/2015  . Morbid obesity ([place]) 05/25/2015  . Perennial allergic rhinitis 05/25/2015  . DM (diabetes mellitus), type 2, uncontrolled, with renal complications ([place]) 48/88/9169  . Snores 05/25/2015  . History of artificial joint 05/25/2015  . Degeneration of intervertebral disc of cervical region 02/06/2015  . Neuritis or radiculitis due to rupture of lumbar intervertebral disc 01/14/2015  . Abnormal presence of protein in urine 05/04/2010  . Vitamin D deficiency 05/04/2010   Ms. Paolillo is a pleasant 53yoF with hx of HTN, DM, HLD s/p laparoscopic right hemicolectomy 05/11/18 for colon cancer - T1N0 (0/21 LNs) readmitted with most likely prolonged postoperative ileus POD#12  -NPO, MIVF/TPN -Clamp NG tube today and monitor - if no issues with n/v after 4-6hrs, will plan removal -DM - insulin -Hypomagnesemia - TPN to be adjusted accordingly -Ambulate 5x/day. Discussed with nursing. -PPx: Lov, SCDs, PPI   LOS: 3 days   Sharon Mt. Dema Severin, M.D. General and Colorectal Surgery [facility]

## 2018-05-24 LAB — GLUCOSE, CAPILLARY
Glucose-Capillary: 159 mg/dL — ABNORMAL HIGH (ref 70–99)
Glucose-Capillary: 190 mg/dL — ABNORMAL HIGH (ref 70–99)
Glucose-Capillary: 191 mg/dL — ABNORMAL HIGH (ref 70–99)
Glucose-Capillary: 191 mg/dL — ABNORMAL HIGH (ref 70–99)
Glucose-Capillary: 199 mg/dL — ABNORMAL HIGH (ref 70–99)
Glucose-Capillary: 207 mg/dL — ABNORMAL HIGH (ref 70–99)
Glucose-Capillary: 229 mg/dL — ABNORMAL HIGH (ref 70–99)

## 2018-05-24 LAB — BASIC METABOLIC PANEL
Anion gap: 6 (ref 5–15)
BUN: 15 mg/dL (ref 8–23)
CO2: 26 mmol/L (ref 22–32)
Calcium: 8.9 mg/dL (ref 8.9–10.3)
Chloride: 104 mmol/L (ref 98–111)
Creatinine, Ser: 0.65 mg/dL (ref 0.44–1.00)
GFR calc Af Amer: >60 mL/min (ref >60)
GFR calc non Af Amer: >60 mL/min (ref >60)
Glucose, Bld: 214 mg/dL — ABNORMAL HIGH (ref 70–99)
Potassium: 4.2 mmol/L (ref 3.5–5.1)
Sodium: 136 mmol/L (ref 135–145)

## 2018-05-24 LAB — URINALYSIS, ROUTINE W REFLEX MICROSCOPIC
Bilirubin Urine: NEGATIVE
Glucose, UA: NEGATIVE mg/dL
Hgb urine dipstick: NEGATIVE
Ketones, ur: NEGATIVE mg/dL
Leukocytes, UA: NEGATIVE
Nitrite: NEGATIVE
Protein, ur: NEGATIVE mg/dL
Specific Gravity, Urine: 1.009 (ref 1.005–1.030)
pH: 7 (ref 5.0–8.0)

## 2018-05-24 LAB — PHOSPHORUS: Phosphorus: 3.3 mg/dL (ref 2.5–4.6)

## 2018-05-24 LAB — MAGNESIUM: Magnesium: 1.6 mg/dL — ABNORMAL LOW (ref 1.7–2.4)

## 2018-05-24 MED ORDER — MAGNESIUM SULFATE 4 GM/100ML IV SOLN
4.0000 g | Freq: Once | INTRAVENOUS | Status: DC
  Filled 2018-05-24: qty 100

## 2018-05-24 MED ORDER — INSULIN ASPART 100 UNIT/ML ~~LOC~~ SOLN
0.0000 [IU] | SUBCUTANEOUS | Status: DC
  Administered 2018-05-24: 7 [IU] via SUBCUTANEOUS
  Administered 2018-05-24: 4 [IU] via SUBCUTANEOUS
  Administered 2018-05-25 (×3): 7 [IU] via SUBCUTANEOUS
  Administered 2018-05-25: 4 [IU] via SUBCUTANEOUS
  Administered 2018-05-25: 11 [IU] via SUBCUTANEOUS
  Administered 2018-05-25 – 2018-05-26 (×3): 7 [IU] via SUBCUTANEOUS
  Administered 2018-05-26: 11 [IU] via SUBCUTANEOUS
  Administered 2018-05-26 (×2): 7 [IU] via SUBCUTANEOUS
  Administered 2018-05-26: 11 [IU] via SUBCUTANEOUS
  Administered 2018-05-27: 4 [IU] via SUBCUTANEOUS
  Administered 2018-05-27 (×2): 7 [IU] via SUBCUTANEOUS
  Administered 2018-05-27: 11 [IU] via SUBCUTANEOUS
  Administered 2018-05-27: 7 [IU] via SUBCUTANEOUS
  Administered 2018-05-27: 11 [IU] via SUBCUTANEOUS
  Administered 2018-05-28: 7 [IU] via SUBCUTANEOUS
  Administered 2018-05-28 (×2): 4 [IU] via SUBCUTANEOUS
  Administered 2018-05-28: 3 [IU] via SUBCUTANEOUS
  Administered 2018-05-28: 7 [IU] via SUBCUTANEOUS
  Administered 2018-05-28 (×2): 3 [IU] via SUBCUTANEOUS
  Administered 2018-05-29: 4 [IU] via SUBCUTANEOUS

## 2018-05-24 MED ORDER — TRACE MINERALS CR-CU-MN-SE-ZN 10-1000-500-60 MCG/ML IV SOLN
INTRAVENOUS | Status: AC
  Administered 2018-05-24: 18:00:00 via INTRAVENOUS
  Filled 2018-05-24: qty 1800

## 2018-05-24 MED ORDER — MAGNESIUM SULFATE 4 GM/100ML IV SOLN
4.0000 g | Freq: Once | INTRAVENOUS | Status: AC
  Administered 2018-05-24: 4 g via INTRAVENOUS
  Filled 2018-05-24: qty 100

## 2018-05-24 MED ORDER — FAT EMULSION PLANT BASED 20 % IV EMUL
240.0000 mL | INTRAVENOUS | Status: AC
  Administered 2018-05-24: 240 mL via INTRAVENOUS
  Filled 2018-05-24: qty 250

## 2018-05-24 MED ORDER — POTASSIUM CHLORIDE 10 MEQ/100ML IV SOLN: 10.0000 meq | INTRAVENOUS | Status: DC

## 2018-05-24 NOTE — Progress Notes (Addendum)
Late entry from 0800 this morning  Subjective Doing reasonably well today. Denies any abdominal pain. Continues to pass gas this yesterday and this morning. Had another small BM yesterday. Denies n/v with NG tube. Had right flank and hip pain yesterday and was put back to suction. Thinks her pain is from her arthritis and lying in bed. Tube has put out 200cc overnight. NG appears in duodenum on XR so have pulled back. XR showed some SB dilation. Ambulated once yesterday.  Objective: Vital signs in last 24 hours: Temp:  [98.8 F (37.1 C)-99.8 F (37.7 C)] 99.7 F (37.6 C) (06/26 0511) Pulse Rate:  [97-101] 100 (06/26 0511) Resp:  [14-18] 14 (06/26 0511) BP: (141-160)/(74-77) 148/77 (06/26 0511) SpO2:  [94 %-98 %] 96 % (06/26 0859) Last BM Date: 05/23/18  Intake/Output from previous day: 06/25 0701 - 06/26 0700 In: 1083.8 [I.V.:1083.8] Out: 700 [Urine:700] Intake/Output this shift: Total I/O In: -  Out: 700 [Urine:700]  Gen: NAD, comfortable with NGT in place CV: RRR Pulm: Normal work of breathing Abd: Soft, nontender, not significantly distended at this time. Incisions c/d/i without erythema Ext: SCDs in place  Lab Results: CBC  Recent Labs    05/22/18 0539 05/23/18 0442  WBC 8.7 10.0  HGB 9.9* 9.3*  HCT 30.6* 29.3*  PLT 369 380   BMET Recent Labs    05/22/18 0539 05/23/18 0442  NA 140 140  K 3.8 3.7  CL 106 106  CO2 24 28  GLUCOSE 130* 168*  BUN 8 10  CREATININE 0.78 0.70  CALCIUM 8.7* 8.6*   PT/INR No results for input(s): LABPROT, INR in the last 72 hours. ABG No results for input(s): PHART, HCO3 in the last 72 hours.  Invalid input(s): PCO2, PO2  Studies/Results:  Anti-infectives: Anti-infectives (From admission, onward)   None       Assessment/Plan: Patient Active Problem List   Diagnosis Date Noted  . SBO (small bowel obstruction) and ileocolonic anastomosis from recent colectomy 05/22/2018  . Cancer of ascending colon - pT1N0 (0/21)  s/p lap right colectomy 05/11/2018 05/21/2018  . Hypomagnesemia 05/21/2018  . Ileus following gastrointestinal surgery ([place]) 05/20/2018  . Essential hemorrhagic thrombocythemia ([place]) 03/29/2018  . Hypercalcemia 11/23/2015  . DDD (degenerative disc disease), lumbar 07/28/2015  . Spinal stenosis at L4-L5 level 06/12/2015  . Benign essential HTN 05/25/2015  . Bunion 05/25/2015  . Cardiac enlargement 05/25/2015  . Cervical radicular pain 05/25/2015  . Back pain, chronic 05/25/2015  . Osteoarthritis 05/25/2015  . Dyslipidemia 05/25/2015  . Gastric reflux 05/25/2015  . Insomnia 05/25/2015  . Eczema intertrigo 05/25/2015  . Bronchitis, chronic, mucopurulent ([place]) 05/25/2015  . Numerous moles 05/25/2015  . Morbid obesity ([place]) 05/25/2015  . Perennial allergic rhinitis 05/25/2015  . DM (diabetes mellitus), type 2, uncontrolled, with renal complications ([place]) 19/50/9326  . Snores 05/25/2015  . History of artificial joint 05/25/2015  . Degeneration of intervertebral disc of cervical region 02/06/2015  . Neuritis or radiculitis due to rupture of lumbar intervertebral disc 01/14/2015  . Abnormal presence of protein in urine 05/04/2010  . Vitamin D deficiency 05/04/2010   [name] is a pleasant 26yoF with hx of HTN, DM, HLD s/p laparoscopic right hemicolectomy 05/11/18 for colon cancer - T1N0 (0/21 LNs) readmitted with most likely prolonged postoperative ileus POD#13  -NPO, MIVF/TPN -Given flank/hip pain, will check UA to ensure that a UTI is not the cause of her pain -Pull back NG 5cm, if no significant output, will reclamp NG tube today and  monitor - if no issues with n/v after 4-6hrs, will plan removal -DM - insulin -Ambulate 5x/day. -PPx: Lov, SCDs, PPI   LOS: 4 days   Sharon Mt. Dema Severin, M.D. General and Colorectal Surgery [facility]

## 2018-05-24 NOTE — Progress Notes (Signed)
PHARMACY - ADULT TOTAL PARENTERAL NUTRITION CONSULT NOTE   Pharmacy Consult for TPN Indication: prolonged ileus  Patient Measurements: Body mass index is 40.6 kg/m. Filed Weights   05/20/18 0806 05/20/18 1327  Weight: 250 lb (113.4 kg) 236 lb 8.9 oz (107.3 kg)  Usual weight:  240 lbs  HPI: 42 yoF re-admitted on 6/22 s/p laparoscopic right hemicolectomy 05/11/18 for colon cancer.  Re-admitted with likely prolonged postoperative ileus, vs partial obstruction at anastamosis.  Pharmacy is consulted for TPN.  Significant events:  6/13 lap right colectomy 05/11/2018  Insulin requirements past 24 hours: used 25 units / 24 hrs moderate SSI (hx DM on PTA Glipizide ER 10mg  daily,  Metformin XR 750 mg daily, Actos 15 mg daily, Semaglutide 0.5 mg qMondays)  Current Nutrition: NPO NG tube to suction with low output.  Clamped on 6/25 AM for several hours, then returned to suction.  Clamped again on 6/26 AM and will be removed if tolerating.  Pt has had flatus and small BM.  IVF: LR at 10 ml/hr  Central access:   PICC line 6/24 TPN start date:  6/24  ASSESSMENT                                                                                                          Today:   Glucose:  CBGs elevated above goal (100-150) range.  Range 151-207.  Managed with SSI  Electrolytes:  WNL including Na, K (at goal >4), Cl, Ca, and Phos.  Mag is low (and not at goal >2) despite previous replacement.  Renal:  WNL  LFTs:  WNL (6/25)  TGs:  59 (6/24)  Prealbumin:  18.6 (6/24)   NUTRITIONAL GOALS                                                                                             RD recs (6/24):  80-90 g Protein, 1600-1800 Kcal Clinimix 5/15 at a goal rate of 75 ml/hr + 20% fat emulsion 240 ml/day to provide: 90 g of protein and 1758 kCals per day meeting 100 % of protein and 100 % of kCal needs   PLAN  Now: Magnesium 4g IV x1 dose   At 1800 today:  Continue Clinimix E 5/15 at 75 ml/hr.  20% fat emulsion at 20 ml/hr x12 hours/day  TPN to contain standard multivitamins and trace element daily.  Continue IVF at 10 ml/hr.  Continue q4h CBGs and increase to resistant SSI q4h.  TPN lab panels on Mondays & Thursdays.  Follow up plans for NG tube clamp/removal and return of bowel function.  Gretta Arab PharmD, BCPS Pager [phone] 05/24/2018 9:21 AM

## 2018-05-25 ENCOUNTER — Inpatient Hospital Stay (HOSPITAL_COMMUNITY): Payer: PPO

## 2018-05-25 LAB — CBC WITH DIFFERENTIAL/PLATELET
Basophils Absolute: 0 10*3/uL (ref 0.0–0.1)
Basophils Relative: 0 %
Eosinophils Absolute: 0.1 10*3/uL (ref 0.0–0.7)
Eosinophils Relative: 1 %
HCT: 31.4 % — ABNORMAL LOW (ref 36.0–46.0)
Hemoglobin: 10.3 g/dL — ABNORMAL LOW (ref 12.0–15.0)
Lymphocytes Relative: 9 %
Lymphs Abs: 1.1 10*3/uL (ref 0.7–4.0)
MCH: 28.6 pg (ref 26.0–34.0)
MCHC: 32.8 g/dL (ref 30.0–36.0)
MCV: 87.2 fL (ref 78.0–100.0)
Monocytes Absolute: 0.9 10*3/uL (ref 0.1–1.0)
Monocytes Relative: 7 %
Neutro Abs: 10.2 10*3/uL — ABNORMAL HIGH (ref 1.7–7.7)
Neutrophils Relative %: 83 %
Platelets: 416 10*3/uL — ABNORMAL HIGH (ref 150–400)
RBC: 3.6 MIL/uL — ABNORMAL LOW (ref 3.87–5.11)
RDW: 14.3 % (ref 11.5–15.5)
WBC: 12.4 10*3/uL — ABNORMAL HIGH (ref 4.0–10.5)

## 2018-05-25 LAB — COMPREHENSIVE METABOLIC PANEL
ALT: 15 U/L (ref 0–44)
AST: 13 U/L — ABNORMAL LOW (ref 15–41)
Albumin: 2.8 g/dL — ABNORMAL LOW (ref 3.5–5.0)
Alkaline Phosphatase: 63 U/L (ref 38–126)
Anion gap: 7 (ref 5–15)
BUN: 16 mg/dL (ref 8–23)
CO2: 26 mmol/L (ref 22–32)
Calcium: 8.8 mg/dL — ABNORMAL LOW (ref 8.9–10.3)
Chloride: 106 mmol/L (ref 98–111)
Creatinine, Ser: 0.6 mg/dL (ref 0.44–1.00)
GFR calc Af Amer: >60 mL/min (ref >60)
GFR calc non Af Amer: >60 mL/min (ref >60)
Glucose, Bld: 215 mg/dL — ABNORMAL HIGH (ref 70–99)
Potassium: 4 mmol/L (ref 3.5–5.1)
Sodium: 139 mmol/L (ref 135–145)
Total Bilirubin: 0.6 mg/dL (ref 0.3–1.2)
Total Protein: 6.4 g/dL — ABNORMAL LOW (ref 6.5–8.1)

## 2018-05-25 LAB — GLUCOSE, CAPILLARY
Glucose-Capillary: 191 mg/dL — ABNORMAL HIGH (ref 70–99)
Glucose-Capillary: 213 mg/dL — ABNORMAL HIGH (ref 70–99)
Glucose-Capillary: 219 mg/dL — ABNORMAL HIGH (ref 70–99)
Glucose-Capillary: 227 mg/dL — ABNORMAL HIGH (ref 70–99)
Glucose-Capillary: 242 mg/dL — ABNORMAL HIGH (ref 70–99)
Glucose-Capillary: 251 mg/dL — ABNORMAL HIGH (ref 70–99)
Glucose-Capillary: 260 mg/dL — ABNORMAL HIGH (ref 70–99)

## 2018-05-25 LAB — MAGNESIUM: Magnesium: 1.9 mg/dL (ref 1.7–2.4)

## 2018-05-25 LAB — PHOSPHORUS: Phosphorus: 4.1 mg/dL (ref 2.5–4.6)

## 2018-05-25 MED ORDER — IOPAMIDOL (ISOVUE-300) INJECTION 61%
30.0000 mL | Freq: Once | INTRAVENOUS | Status: AC
  Administered 2018-05-25: 30 mL via ORAL

## 2018-05-25 MED ORDER — BOOST / RESOURCE BREEZE PO LIQD CUSTOM
1.0000 | Freq: Two times a day (BID) | ORAL | Status: DC
  Administered 2018-05-25: 1 via ORAL

## 2018-05-25 MED ORDER — IOPAMIDOL (ISOVUE-300) INJECTION 61%
INTRAVENOUS | Status: AC
  Administered 2018-05-25: 12:00:00
  Filled 2018-05-25: qty 30

## 2018-05-25 MED ORDER — IOPAMIDOL (ISOVUE-300) INJECTION 61%
100.0000 mL | Freq: Once | INTRAVENOUS | Status: AC | PRN
  Administered 2018-05-25: 100 mL via INTRAVENOUS

## 2018-05-25 MED ORDER — TRACE MINERALS CR-CU-MN-SE-ZN 10-1000-500-60 MCG/ML IV SOLN
INTRAVENOUS | Status: AC
  Administered 2018-05-25: 18:00:00 via INTRAVENOUS
  Filled 2018-05-25: qty 1800

## 2018-05-25 MED ORDER — LACTATED RINGERS IV BOLUS
500.0000 mL | Freq: Once | INTRAVENOUS | Status: AC
  Administered 2018-05-25: 500 mL via INTRAVENOUS

## 2018-05-25 MED ORDER — FAT EMULSION PLANT BASED 20 % IV EMUL
240.0000 mL | INTRAVENOUS | Status: AC
  Administered 2018-05-25: 240 mL via INTRAVENOUS
  Filled 2018-05-25: qty 250

## 2018-05-25 MED ORDER — MAGNESIUM SULFATE IN D5W 1-5 GM/100ML-% IV SOLN
1.0000 g | Freq: Once | INTRAVENOUS | Status: AC
  Administered 2018-05-25: 1 g via INTRAVENOUS
  Filled 2018-05-25: qty 100

## 2018-05-25 MED ORDER — IOPAMIDOL (ISOVUE-300) INJECTION 61%
INTRAVENOUS | Status: AC
  Administered 2018-05-25: 12:00:00
  Filled 2018-05-25: qty 100

## 2018-05-25 MED ORDER — POLYETHYLENE GLYCOL 3350 17 G PO PACK
17.0000 g | PACK | Freq: Two times a day (BID) | ORAL | Status: DC
  Administered 2018-05-25 – 2018-05-27 (×5): 17 g via ORAL
  Filled 2018-05-25 (×6): qty 1

## 2018-05-25 NOTE — Progress Notes (Signed)
PHARMACY - ADULT TOTAL PARENTERAL NUTRITION CONSULT NOTE   Pharmacy Consult for TPN Indication: prolonged ileus  Patient Measurements: Body mass index is 40.6 kg/m. Filed Weights   05/20/18 0806 05/20/18 1327  Weight: 250 lb (113.4 kg) 236 lb 8.9 oz (107.3 kg)  Usual weight:  240 lbs  HPI: 76 yoF re-admitted on 6/22 s/p laparoscopic right hemicolectomy 05/11/18 for colon cancer.  Re-admitted with likely prolonged postoperative ileus. Pharmacy is consulted for TPN.  Significant events:  6/13 lap right colectomy 05/11/2018  Insulin requirements past 24 hours: used 25 units / 24 hrs moderate SSI (hx DM on PTA Glipizide ER 10mg  daily,  Metformin XR 750 mg daily, Actos 15 mg daily, Semaglutide 0.5 mg qMondays)  Current Nutrition: NPO  IVF: LR at 10 ml/hr  Central access:   PICC line 6/24 TPN start date:  6/24  Significant events: - failed clamping 6/25 - tolerated clamping 6/26 - NGT removed 6/27; repeating CT d/t low grade temps and tachycardia. Lots of flatus, 1x BM  ASSESSMENT                                                                                                          Today:   Glucose: CBGs elevated above goal (100-150) on resistant SSI. Range 191-229.   Electrolytes:  WNL; Mg slightly below goal for ileus  Renal: WNL  LFTs: WNL (6/25)  TGs: 59 (6/24)  Prealbumin: 18.6 (6/24)   NUTRITIONAL GOALS                                                                                             RD recs (6/24):  80-90 g Protein, 1600-1800 Kcal Clinimix 5/15 at a goal rate of 75 ml/hr + 20% fat emulsion 240 ml/day to provide: 90 g of protein and 1758 kCals per day meeting 100 % of protein and 100 % of kCal needs   Keep Mg >/= 2.0 and K >/= 4.0 with suspected ileus  PLAN                                                                                                                         Magnesium  1g IV x1  At 1800 today:  Continue Clinimix E 5/15 at 75  ml/hr.  20% fat emulsion at 20 ml/hr x12 hours/day  TPN to contain standard multivitamins and trace element daily.  Continue IVF at 10 ml/hr.  Continue q4h CBGs and increase to resistant SSI q4h; will add 15 units regular insulin to TPN  TPN lab panels on Mondays & Thursdays; repeat BMP, Mg tomorrow AM  F/u diet advancement and ability to wean TPN   Reuel Boom, PharmD, BCPS [phone] 05/25/2018, 10:18 AM

## 2018-05-25 NOTE — Consult Note (Signed)
   [facility] Inpatient Consult   05/25/2018  Cindy Griffin [birth year] [id number]   Spoke with Cindy Griffin at bedside about [place] Management program. She has been outreached by [place] team for [facility] assistance. Cindy Griffin is familiar with the [place] Management program and is agreeable to [facility] follow up as well. [facility] Care Management written consent obtained.   Cindy Griffin lives alone. However, she reports she may stay with her daughter in [place] post discharge.  Confirmed Primary Care MD is Dr. Ancil Boozer ( [place] listed as doing transition of care calls).  Confirmed best contact numbers for Cindy Griffin as cell [phone].  Will continue to follow and assign to [place] closer to hospital discharge.  Will make inpatient RNCM aware [place] Management will follow.    Marthenia Rolling, MSN-Ed, RN,BSN [facility] Liaison [phone]

## 2018-05-25 NOTE — Progress Notes (Addendum)
Subjective Abdominal soreness around midline incision. Low grade temp last night and some nausea. No emesis. NG tube removed yesterday. Has been passing "lots of flatus." 1 BM. Says she feels hungry intermittently. Had right flank and hip pain yesterday and again today. Robaxin helps. She thinks her pain is from her arthritis and lying in bed. Low grade temp overnight and HR 100-117.  Objective: Vital signs in last 24 hours: Temp:  [98.2 F (36.8 C)-100.5 F (38.1 C)] 98.4 F (36.9 C) (06/27 0436) Pulse Rate:  [100-117] 100 (06/27 0436) Resp:  [18-24] 20 (06/27 0436) BP: (132-151)/(70-88) 132/70 (06/27 0436) SpO2:  [95 %-98 %] 98 % (06/27 0436) Last BM Date: 05/23/18  Intake/Output from previous day: 06/26 0701 - 06/27 0700 In: 830 [I.V.:730; IV Piggyback:100] Out: 1350 [Urine:1300; Emesis/NG output:50] Intake/Output this shift: No intake/output data recorded.  Gen: NAD, comfortable with NGT in place CV: RRR Pulm: Normal work of breathing Abd: Soft, obese, nontender, not significantly distended at this time. Incisions c/d/i without erythema Ext: SCDs in place  Lab Results: CBC  Recent Labs    05/23/18 0442  WBC 10.0  HGB 9.3*  HCT 29.3*  PLT 380   BMET Recent Labs    05/24/18 1330 05/25/18 0448  NA 136 139  K 4.2 4.0  CL 104 106  CO2 26 26  GLUCOSE 214* 215*  BUN 15 16  CREATININE 0.65 0.60  CALCIUM 8.9 8.8*   PT/INR No results for input(s): LABPROT, INR in the last 72 hours. ABG No results for input(s): PHART, HCO3 in the last 72 hours.  Invalid input(s): PCO2, PO2  Studies/Results:  Anti-infectives: Anti-infectives (From admission, onward)   None       Assessment/Plan: Patient Active Problem List   Diagnosis Date Noted  . SBO (small bowel obstruction) and ileocolonic anastomosis from recent colectomy 05/22/2018  . Cancer of ascending colon - pT1N0 (0/21) s/p lap right colectomy 05/11/2018 05/21/2018  . Hypomagnesemia 05/21/2018  . Ileus  following gastrointestinal surgery ([place]) 05/20/2018  . Essential hemorrhagic thrombocythemia ([place]) 03/29/2018  . Hypercalcemia 11/23/2015  . DDD (degenerative disc disease), lumbar 07/28/2015  . Spinal stenosis at L4-L5 level 06/12/2015  . Benign essential HTN 05/25/2015  . Bunion 05/25/2015  . Cardiac enlargement 05/25/2015  . Cervical radicular pain 05/25/2015  . Back pain, chronic 05/25/2015  . Osteoarthritis 05/25/2015  . Dyslipidemia 05/25/2015  . Gastric reflux 05/25/2015  . Insomnia 05/25/2015  . Eczema intertrigo 05/25/2015  . Bronchitis, chronic, mucopurulent ([place]) 05/25/2015  . Numerous moles 05/25/2015  . Morbid obesity ([place]) 05/25/2015  . Perennial allergic rhinitis 05/25/2015  . DM (diabetes mellitus), type 2, uncontrolled, with renal complications ([place]) 53/61/4431  . Snores 05/25/2015  . History of artificial joint 05/25/2015  . Degeneration of intervertebral disc of cervical region 02/06/2015  . Neuritis or radiculitis due to rupture of lumbar intervertebral disc 01/14/2015  . Abnormal presence of protein in urine 05/04/2010  . Vitamin D deficiency 05/04/2010   Cindy Griffin is a pleasant 50yoF with hx of HTN, DM, HLD s/p laparoscopic right hemicolectomy 05/11/18 for colon cancer - T1N0 (0/21 LNs) readmitted with most likely prolonged postoperative ileus POD#14  -NPO, MIVF/TPN -UA negative -CBC, EKG -Will obtain CT A/P today to evaluate for any infectious complications that may have appeared later than expected. Will also obtain CTA chest for dyspnea/tachycardia and this will also serve as baseline for her colon cancer dx -DM - insulin -Ambulate 5x/day -PPx: Lov, SCDs, PPI   LOS: 5 days  Sharon Mt. Dema Severin, M.D. General and Colorectal Surgery [facility]

## 2018-05-25 NOTE — Progress Notes (Signed)
Nutrition Follow-up  DOCUMENTATION CODES:   Morbid obesity  INTERVENTION:  - Continue TPN per Pharmacy. - Continue diet advancement as medically feasible. - Continue to encourage PO intakes.  - Will order Boost Breeze BID, each supplement provides 250 kcal and 9 grams of protein.  NUTRITION DIAGNOSIS:   Inadequate oral intake related to altered GI function, nausea, vomiting as evidenced by per patient/family report. -ongoing  GOAL:   Patient will meet greater than or equal to 90% of their needs -met with TPN regimen.   MONITOR:   PO intake, Supplement acceptance, Diet advancement, Weight trends, Labs, Other (Comment)(TPN regimen)  ASSESSMENT:   Patient with PMH significant for COPD, DM, HLD, and HTN. Recently discharged 6/17 after a laparscopic right hemicolectomy on 6/13.  At the time of discharge, she was passing gas, having bowel movements, eating a soft diet, and not having n/v. Presents this admission with nausea/vomiting. Found to have an ileus vs partial obstruction at anastomosis.   No new weight since admission on 6/22. Patient had NGT removed 6/26 and diet advanced from NPO to CLD today at 1:00 PM; no intakes from that time to RD visit earlier this afternoon. Patient with some ongoing abdominal pressure/discomfort but much improved.   She has a R brachial double lumen PICC and is receiving Clinimix E 5/15 @ 75 mL/hr with 20% ILE @ 20 mL/hr x12 hours. This regimen is providing 90 grams of protein and 1758 kcal. This regimen is providing 100% estimated protein and kcal need.   Medications reviewed; sliding scale Novolog, 1 g IV Mg sulfate x1 run today, 4 g IV Mg sulfate x1 run yesterday, 40 mg oral Protonix/day, 1 packet Miralax BID. Labs reviewed; CBGs: 191-227 mg/dL today, Ca: 8.8 mg/dL.     Diet Order:   Diet Order           Diet clear liquid Room service appropriate? Yes; Fluid consistency: Thin  Diet effective now          EDUCATION NEEDS:   Education needs  have been addressed  Skin:  Skin Assessment: Skin Integrity Issues: Skin Integrity Issues:: Incisions Incisions: abdomen (6/13) and L shoulder (6/20)  Last BM:  6/25  Height:   Ht Readings from Last 1 Encounters:  05/20/18 _0  (1.626 m)    Weight:   Wt Readings from Last 1 Encounters:  05/20/18 236 lb 8.9 oz (107.3 kg)    Ideal Body Weight:  54.5 kg  BMI:  Body mass index is 40.6 kg/m.  Estimated Nutritional Needs:   Kcal:  1600-1800 kcal  Protein:  80-90 g  Fluid:  >1.6 L/day     Jarome Matin, MS, RD, LDN, [facility] Inpatient Clinical Dietitian Pager # [phone] After hours/weekend pager # [phone]

## 2018-05-26 LAB — BASIC METABOLIC PANEL
Anion gap: 7 (ref 5–15)
BUN: 16 mg/dL (ref 8–23)
CO2: 26 mmol/L (ref 22–32)
Calcium: 8.9 mg/dL (ref 8.9–10.3)
Chloride: 104 mmol/L (ref 98–111)
Creatinine, Ser: 0.71 mg/dL (ref 0.44–1.00)
GFR calc Af Amer: >60 mL/min (ref >60)
GFR calc non Af Amer: >60 mL/min (ref >60)
Glucose, Bld: 253 mg/dL — ABNORMAL HIGH (ref 70–99)
Potassium: 4.3 mmol/L (ref 3.5–5.1)
Sodium: 137 mmol/L (ref 135–145)

## 2018-05-26 LAB — GLUCOSE, CAPILLARY
Glucose-Capillary: 242 mg/dL — ABNORMAL HIGH (ref 70–99)
Glucose-Capillary: 228 mg/dL — ABNORMAL HIGH (ref 70–99)
Glucose-Capillary: 277 mg/dL — ABNORMAL HIGH (ref 70–99)
Glucose-Capillary: 220 mg/dL — ABNORMAL HIGH (ref 70–99)
Glucose-Capillary: 243 mg/dL — ABNORMAL HIGH (ref 70–99)

## 2018-05-26 LAB — MAGNESIUM: Magnesium: 1.7 mg/dL (ref 1.7–2.4)

## 2018-05-26 MED ORDER — GLUCERNA SHAKE PO LIQD
237.0000 mL | Freq: Three times a day (TID) | ORAL | Status: DC
  Administered 2018-05-26 – 2018-05-31 (×7): 237 mL via ORAL
  Filled 2018-05-26 (×17): qty 237

## 2018-05-26 MED ORDER — MAGNESIUM SULFATE 4 GM/100ML IV SOLN
4.0000 g | Freq: Once | INTRAVENOUS | Status: AC
  Administered 2018-05-26: 4 g via INTRAVENOUS
  Filled 2018-05-26: qty 100

## 2018-05-26 MED ORDER — FAT EMULSION PLANT BASED 20 % IV EMUL
240.0000 mL | INTRAVENOUS | Status: AC
  Administered 2018-05-26: 240 mL via INTRAVENOUS
  Filled 2018-05-26: qty 250

## 2018-05-26 MED ORDER — TRACE MINERALS CR-CU-MN-SE-ZN 10-1000-500-60 MCG/ML IV SOLN
INTRAVENOUS | Status: AC
  Administered 2018-05-26: 18:00:00 via INTRAVENOUS
  Filled 2018-05-26: qty 1800

## 2018-05-26 MED ORDER — GABAPENTIN 300 MG PO CAPS
300.0000 mg | ORAL_CAPSULE | Freq: Three times a day (TID) | ORAL | Status: DC
  Administered 2018-05-26 – 2018-05-31 (×16): 300 mg via ORAL
  Filled 2018-05-26 (×16): qty 1

## 2018-05-26 MED ORDER — IBUPROFEN 200 MG PO TABS
600.0000 mg | ORAL_TABLET | Freq: Four times a day (QID) | ORAL | Status: DC | PRN
  Administered 2018-05-26 – 2018-05-31 (×6): 600 mg via ORAL
  Filled 2018-05-26 (×6): qty 3

## 2018-05-26 NOTE — Progress Notes (Signed)
PHARMACY - ADULT TOTAL PARENTERAL NUTRITION CONSULT NOTE   Pharmacy Consult for TPN Indication: prolonged ileus  Patient Measurements: Body mass index is 40.6 kg/m. Filed Weights   05/20/18 0806 05/20/18 1327  Weight: 250 lb (113.4 kg) 236 lb 8.9 oz (107.3 kg)  Usual weight:  240 lbs  HPI: 22 yoF re-admitted on 6/22 s/p laparoscopic right hemicolectomy 05/11/18 for colon cancer. Re-admitted with likely prolonged postoperative ileus. Pharmacy is consulted for TPN.  Insulin requirements past 24 hours: 43 units aspart yesterday, started insulin in TPN last night (< 5 units received yesterday)  Current Nutrition: Full liquids  IVF: LR at 10 ml/hr  Central access:   PICC line 6/24 TPN start date:  6/24  Significant events:  - 6/13 lap right colectomy - failed clamping 6/25 - tolerated clamping 6/26 - NGT removed 6/27; repeating CT d/t low grade temps and tachycardia. Lots of flatus, 1x BM - 6/28: fevers noted although CT chest/pelvis clear; advancing to full liquids, Glucerna ordered  ASSESSMENT                                                                                                          Today:   Glucose: CBGs remain elevated above goal (100-150) despite resistant SSI; range 228-251 (note patient given Breeze while on CLD, this is not a DM-modified supplement)  Electrolytes:  WNL except Mg low again  Renal: WNL  LFTs: WNL (6/25)  TGs: 59 (6/24)  Prealbumin: 18.6 (6/24)   NUTRITIONAL GOALS                                                                                             RD recs (6/28):  80-90 g Protein, 1600-1800 Kcal  Clinimix 5/15 at a goal rate of 75 ml/hr + 20% fat emulsion 240 ml/day to provide: 90 g of protein and 1758 kCals per day meeting 100 % of protein and 100 % of kCal needs   Keep Mg >/= 2.0 and K >/= 4.0 with suspected ileus  PLAN  Magnesium 4g IV x1  At 1800 today:  Continue Clinimix E 5/15 at 75 ml/hr - will plan to wean tomorrow if patient tolerates Glucerna well today  20% fat emulsion at 20 ml/hr x12 hours/day  TPN to contain standard multivitamins and trace element daily.  Continue IVF at 10 ml/hr.  Continue q4h CBGs and resistant SSI q4h; will increase regular insulin in TPN to 30 units (hoping CBGs will not be as reactive on Glucerna as on Breeze)  TPN lab panels on Mondays & Thursdays; repeat BMP, Mg tomorrow AM   Reuel Boom, PharmD, BCPS [phone] 05/26/2018, 10:04 AM

## 2018-05-26 NOTE — Progress Notes (Signed)
Subjective CT C/A/P yesterday was only remarkable for mildly dilated SB, somewhat improved. Denies n/v. Continues to pass lots of gas, last BM 2d ago. Continues to have intermittent (chronic) right hip pain she relates to sciatica.  Objective: Vital signs in last 24 hours: Temp:  [99.5 F (37.5 C)-100.8 F (38.2 C)] 99.8 F (37.7 C) (06/28 0400) Pulse Rate:  [97-123] 97 (06/28 0400) Resp:  [14-16] 16 (06/28 0400) BP: (126-136)/(74-76) 128/76 (06/28 0400) SpO2:  [93 %-99 %] 93 % (06/28 0744) Last BM Date: 05/23/18  Intake/Output from previous day: 06/27 0701 - 06/28 0700 In: 2986.1 [P.O.:480; I.V.:1906.1; IV Piggyback:600] Out: -  Intake/Output this shift: No intake/output data recorded.  Gen: NAD, comfortable CV: RRR Pulm: Normal work of breathing Abd: Soft, obese, nontender, nondistended. Incisions c/d/i without erythema Ext: SCDs in place  Lab Results: CBC  Recent Labs    05/25/18 1015  WBC 12.4*  HGB 10.3*  HCT 31.4*  PLT 416*   BMET Recent Labs    05/25/18 0448 05/26/18 0500  NA 139 137  K 4.0 4.3  CL 106 104  CO2 26 26  GLUCOSE 215* 253*  BUN 16 16  CREATININE 0.60 0.71  CALCIUM 8.8* 8.9   PT/INR No results for input(s): LABPROT, INR in the last 72 hours. ABG No results for input(s): PHART, HCO3 in the last 72 hours.  Invalid input(s): PCO2, PO2  Studies/Results:  Anti-infectives: Anti-infectives (From admission, onward)   None       Assessment/Plan: Patient Active Problem List   Diagnosis Date Noted  . SBO (small bowel obstruction) and ileocolonic anastomosis from recent colectomy 05/22/2018  . Cancer of ascending colon - pT1N0 (0/21) s/p lap right colectomy 05/11/2018 05/21/2018  . Hypomagnesemia 05/21/2018  . Ileus following gastrointestinal surgery ([place]) 05/20/2018  . Essential hemorrhagic thrombocythemia ([place]) 03/29/2018  . Hypercalcemia 11/23/2015  . DDD (degenerative disc disease), lumbar 07/28/2015  . Spinal stenosis at L4-L5  level 06/12/2015  . Benign essential HTN 05/25/2015  . Bunion 05/25/2015  . Cardiac enlargement 05/25/2015  . Cervical radicular pain 05/25/2015  . Back pain, chronic 05/25/2015  . Osteoarthritis 05/25/2015  . Dyslipidemia 05/25/2015  . Gastric reflux 05/25/2015  . Insomnia 05/25/2015  . Eczema intertrigo 05/25/2015  . Bronchitis, chronic, mucopurulent ([place]) 05/25/2015  . Numerous moles 05/25/2015  . Morbid obesity ([place]) 05/25/2015  . Perennial allergic rhinitis 05/25/2015  . DM (diabetes mellitus), type 2, uncontrolled, with renal complications ([place]) 91/79/1505  . Snores 05/25/2015  . History of artificial joint 05/25/2015  . Degeneration of intervertebral disc of cervical region 02/06/2015  . Neuritis or radiculitis due to rupture of lumbar intervertebral disc 01/14/2015  . Abnormal presence of protein in urine 05/04/2010  . Vitamin D deficiency 05/04/2010   Cindy Griffin is a pleasant 37yoF with hx of HTN, DM, HLD s/p laparoscopic right hemicolectomy 05/11/18 for colon cancer - T1N0 (0/21 LNs) readmitted with most likely prolonged postoperative ileus POD#14  -Advance to full liquids with glucerna given DM -D/C all narcotics, robaxin, reglan. Continue tylenol; added motrin and gabapentin. -DM - insulin with TPN -Hypomagnesemia - magnesium being adjusted in TPN -Ambulate 5x/day -PPx: Lov, SCDs, PPI   LOS: 6 days   Sharon Mt. Dema Severin, M.D. General and Colorectal Surgery [facility]

## 2018-05-26 NOTE — Care Management Important Message (Signed)
Important Message  Patient Details  Name: Cindy Griffin MRN: [id number] Date of Birth: 1946/03/24   Medicare Important Message Given:  Yes    Kerin Salen 05/26/2018, 9:49 AMImportant Message  Patient Details  Name: Cindy Griffin MRN: [id number] Date of Birth: [birth year]   Medicare Important Message Given:  Yes    Kerin Salen 05/26/2018, 9:49 AM

## 2018-05-27 LAB — GLUCOSE, CAPILLARY
Glucose-Capillary: 168 mg/dL — ABNORMAL HIGH (ref 70–99)
Glucose-Capillary: 201 mg/dL — ABNORMAL HIGH (ref 70–99)
Glucose-Capillary: 212 mg/dL — ABNORMAL HIGH (ref 70–99)
Glucose-Capillary: 240 mg/dL — ABNORMAL HIGH (ref 70–99)
Glucose-Capillary: 272 mg/dL — ABNORMAL HIGH (ref 70–99)
Glucose-Capillary: 273 mg/dL — ABNORMAL HIGH (ref 70–99)

## 2018-05-27 LAB — BASIC METABOLIC PANEL
Anion gap: 7 (ref 5–15)
BUN: 20 mg/dL (ref 8–23)
CO2: 25 mmol/L (ref 22–32)
Calcium: 8.8 mg/dL — ABNORMAL LOW (ref 8.9–10.3)
Chloride: 106 mmol/L (ref 98–111)
Creatinine, Ser: 0.77 mg/dL (ref 0.44–1.00)
GFR calc Af Amer: >60 mL/min (ref >60)
GFR calc non Af Amer: >60 mL/min (ref >60)
Glucose, Bld: 282 mg/dL — ABNORMAL HIGH (ref 70–99)
Potassium: 4.3 mmol/L (ref 3.5–5.1)
Sodium: 138 mmol/L (ref 135–145)

## 2018-05-27 LAB — MAGNESIUM: Magnesium: 1.9 mg/dL (ref 1.7–2.4)

## 2018-05-27 MED ORDER — FAT EMULSION PLANT BASED 20 % IV EMUL
180.0000 mL | INTRAVENOUS | Status: AC
  Administered 2018-05-27: 180 mL via INTRAVENOUS
  Filled 2018-05-27: qty 250

## 2018-05-27 MED ORDER — TRACE MINERALS CR-CU-MN-SE-ZN 10-1000-500-60 MCG/ML IV SOLN
INTRAVENOUS | Status: AC
  Administered 2018-05-27: 17:00:00 via INTRAVENOUS
  Filled 2018-05-27: qty 1200

## 2018-05-27 NOTE — Progress Notes (Signed)
Subjective  Continues to pass lots of gas, had a BM last night but nauseated earlier this am.   Objective: Vital signs in last 24 hours: Temp:  [98.4 F (36.9 C)-99 F (37.2 C)] 98.4 F (36.9 C) (06/29 0433) Pulse Rate:  [93-100] 100 (06/29 0433) Resp:  [14-18] 18 (06/29 0433) BP: (118-135)/(69-89) 118/69 (06/29 0433) SpO2:  [93 %-99 %] 97 % (06/29 0433) Last BM Date: 05/26/18  Intake/Output from previous day: 06/28 0701 - 06/29 0700 In: 3485.4 [P.O.:480; I.V.:2905.4; IV Piggyback:100] Out: -  Intake/Output this shift: No intake/output data recorded.  Gen: NAD, comfortable CV: RRR Pulm: Normal work of breathing Abd: Soft, obese, nontender, nondistended. Incisions c/d/i without erythema Ext: SCDs in place  Lab Results: CBC  Recent Labs    05/25/18 1015  WBC 12.4*  HGB 10.3*  HCT 31.4*  PLT 416*   BMET Recent Labs    05/26/18 0500 05/27/18 0500  NA 137 138  K 4.3 4.3  CL 104 106  CO2 26 25  GLUCOSE 253* 282*  BUN 16 20  CREATININE 0.71 0.77  CALCIUM 8.9 8.8*   PT/INR No results for input(s): LABPROT, INR in the last 72 hours. ABG No results for input(s): PHART, HCO3 in the last 72 hours.  Invalid input(s): PCO2, PO2  Studies/Results:  Anti-infectives: Anti-infectives (From admission, onward)   None       Assessment/Plan: Patient Active Problem List   Diagnosis Date Noted  . SBO (small bowel obstruction) and ileocolonic anastomosis from recent colectomy 05/22/2018  . Cancer of ascending colon - pT1N0 (0/21) s/p lap right colectomy 05/11/2018 05/21/2018  . Hypomagnesemia 05/21/2018  . Ileus following gastrointestinal surgery ([place]) 05/20/2018  . Essential hemorrhagic thrombocythemia ([place]) 03/29/2018  . Hypercalcemia 11/23/2015  . DDD (degenerative disc disease), lumbar 07/28/2015  . Spinal stenosis at L4-L5 level 06/12/2015  . Benign essential HTN 05/25/2015  . Bunion 05/25/2015  . Cardiac enlargement 05/25/2015  . Cervical radicular pain  05/25/2015  . Back pain, chronic 05/25/2015  . Osteoarthritis 05/25/2015  . Dyslipidemia 05/25/2015  . Gastric reflux 05/25/2015  . Insomnia 05/25/2015  . Eczema intertrigo 05/25/2015  . Bronchitis, chronic, mucopurulent ([place]) 05/25/2015  . Numerous moles 05/25/2015  . Morbid obesity ([place]) 05/25/2015  . Perennial allergic rhinitis 05/25/2015  . DM (diabetes mellitus), type 2, uncontrolled, with renal complications ([place]) 88/28/0034  . Snores 05/25/2015  . History of artificial joint 05/25/2015  . Degeneration of intervertebral disc of cervical region 02/06/2015  . Neuritis or radiculitis due to rupture of lumbar intervertebral disc 01/14/2015  . Abnormal presence of protein in urine 05/04/2010  . Vitamin D deficiency 05/04/2010   Ms. Curfman is a pleasant 28yoF with hx of HTN, DM, HLD s/p laparoscopic right hemicolectomy 05/11/18 for colon cancer - T1N0 (0/21 LNs) readmitted with most likely prolonged postoperative ileus POD#15  -Advance to soft foods as tolerated with glucerna given DM -Avoid narcotics. Continue tylenol; added motrin and gabapentin. -DM - insulin with TPN -Hypomagnesemia - magnesium being adjusted in TPN -Ambulate 5x/day -PPx: Lov, SCDs, PPI   LOS: 7 days   Rosario Adie, MD  Colorectal and [facility]

## 2018-05-27 NOTE — Progress Notes (Signed)
PHARMACY - ADULT TOTAL PARENTERAL NUTRITION CONSULT NOTE   Pharmacy Consult for TPN Indication: prolonged ileus  Patient Measurements: Body mass index is 40.6 kg/m. Filed Weights   05/20/18 0806 05/20/18 1327  Weight: 250 lb (113.4 kg) 236 lb 8.9 oz (107.3 kg)  Usual weight:  240 lbs  HPI: 51 yoF re-admitted on 6/22 s/p laparoscopic right hemicolectomy 05/11/18 for colon cancer. Re-admitted with likely prolonged postoperative ileus. Pharmacy is consulted for TPN.  Insulin requirements past 24 hours: 54units SSI Novolog last 24h, insulin in TPN  Current Nutrition: Full liquids  IVF: LR at 10 ml/hr  Central access:   PICC line 6/24 TPN start date:  6/24  Significant events:  - 6/13 lap right colectomy - failed clamping 6/25 - tolerated clamping 6/26 - NGT removed 6/27; repeating CT d/t low grade temps and tachycardia. Lots of flatus, 1x BM - 6/28: fevers noted although CT chest/pelvis clear; advancing to full liquids, Glucerna ordered - 6/29 Diet to soft, lots of gas, + BM 6/28 PM. Patient reports one episode of vomiting this am.  Trying to eat.  Plans on ordering lunch with soft diet  ASSESSMENT                                                                                                          Today:   Glucose: CBGs remain elevated above goal (100-150) despite resistant SSI with 30 units regular insulin in TPN; Resource breeze changed to Glucerna  Electrolytes:  All WNL this am  Renal: WNL  LFTs: WNL (6/25)  TGs: 59 (6/24)  Prealbumin: 18.6 (6/24)   NUTRITIONAL GOALS                                                                                             RD recs (6/28):  80-90 g Protein, 1600-1800 Kcal  Clinimix 5/15 at a goal rate of 75 ml/hr + 20% fat emulsion 240 ml/day to provide: 90 g of protein and 1758 kCals per day meeting 100 % of protein and 100 % of kCal needs   Keep Mg >/= 2.0 and K >/= 4.0 with suspected ileus  PLAN  At 1800 today:  Based on diet advancement will wean Clinimix E5/15 to 91ml/hr  Reduce 20% fat emulsion at 15 ml/hr x12 hours/day   New rates with provide 60gm protein (75% lower end goal) and 1212 Kcals (75% lower Kcal goal)  Reduce insulin in TPN to 25 units for reduce Dextrose in TPN  TPN to contain standard multivitamins and trace element daily.  Continue IVF at 10 ml/hr.  Continue q4h CBGs and resistant SSI q4h;  TPN lab panels on Mondays & Thursdays   Doreene Eland, PharmD, BCPS.   Pager: [phone] 05/27/2018 9:24 AM

## 2018-05-28 LAB — GLUCOSE, CAPILLARY
Glucose-Capillary: 138 mg/dL — ABNORMAL HIGH (ref 70–99)
Glucose-Capillary: 143 mg/dL — ABNORMAL HIGH (ref 70–99)
Glucose-Capillary: 144 mg/dL — ABNORMAL HIGH (ref 70–99)
Glucose-Capillary: 159 mg/dL — ABNORMAL HIGH (ref 70–99)
Glucose-Capillary: 192 mg/dL — ABNORMAL HIGH (ref 70–99)
Glucose-Capillary: 204 mg/dL — ABNORMAL HIGH (ref 70–99)
Glucose-Capillary: 206 mg/dL — ABNORMAL HIGH (ref 70–99)

## 2018-05-28 MED ORDER — HYDROCHLOROTHIAZIDE 25 MG PO TABS
25.0000 mg | ORAL_TABLET | Freq: Every day | ORAL | Status: DC
  Administered 2018-05-28 – 2018-05-31 (×4): 25 mg via ORAL
  Filled 2018-05-28 (×4): qty 1

## 2018-05-28 MED ORDER — TRACE MINERALS CR-CU-MN-SE-ZN 10-1000-500-60 MCG/ML IV SOLN
INTRAVENOUS | Status: AC
  Administered 2018-05-28: 18:00:00 via INTRAVENOUS
  Filled 2018-05-28: qty 1200

## 2018-05-28 MED ORDER — FAT EMULSION PLANT BASED 20 % IV EMUL
180.0000 mL | INTRAVENOUS | Status: AC
  Administered 2018-05-28: 180 mL via INTRAVENOUS
  Filled 2018-05-28: qty 250

## 2018-05-28 NOTE — Progress Notes (Signed)
PHARMACY - ADULT TOTAL PARENTERAL NUTRITION CONSULT NOTE   Pharmacy Consult for TPN Indication: prolonged ileus  Patient Measurements: Body mass index is 40.6 kg/m. Filed Weights   05/20/18 0806 05/20/18 1327  Weight: 250 lb (113.4 kg) 236 lb 8.9 oz (107.3 kg)  Usual weight:  240 lbs  HPI: 49 yoF re-admitted on 6/22 s/p laparoscopic right hemicolectomy 05/11/18 for colon cancer. Re-admitted with likely prolonged postoperative ileus. Pharmacy is consulted for TPN.  Insulin requirements past 24 hours: 31units SSI Novolog last 24h, 25 units regular insulin in TPN  Current Nutrition: Full liquids  IVF: LR at 10 ml/hr  Central access:   PICC line 6/24 TPN start date:  6/24  Significant events:  - 6/13 lap right colectomy - failed clamping 6/25 - tolerated clamping 6/26 - NGT removed 6/27; repeating CT d/t low grade temps and tachycardia. Lots of flatus, 1x BM - 6/28: fevers noted although CT chest/pelvis clear; advancing to full liquids, Glucerna ordered - 6/29 Diet to soft, lots of gas, + BM 6/28 PM. Patient reports one episode of vomiting this am.  Trying to eat.  Plans on ordering lunch now on soft diet - 6/30 only taking a couple bites of food.  States only can tolerate a couple bite as not much of an appetite, also has complaints about how some of the food is cooked.    ASSESSMENT                                                                                                          Today:   Glucose: CBGs much improved with decreasing TPN for PO intake 6/29PM.  remains on resistant SSI with 25 units regular insulin in TPN; Resource breeze changed to Glucerna  Electrolytes:  Follow-up labs in am  Renal: WNL  LFTs: WNL (6/25)  TGs: 59 (6/24)  Prealbumin: 18.6 (6/24)   NUTRITIONAL GOALS                                                                                             RD recs (6/28):  80-90 g Protein, 1600-1800 Kcal  Clinimix 5/15 at a goal rate of 75 ml/hr +  20% fat emulsion 240 ml/day to provide: 90 g of protein and 1758 kCals per day meeting 100 % of protein and 100 % of kCal needs   Keep Mg >/= 2.0 and K >/= 4.0 with suspected ileus  PLAN  At 1800 today:  Continue reduced rate TPN (to encourage PO intake) at Clinimix E5/15 at 12ml/hr  Continued reduce rate  20% fat emulsion at 15 ml/hr x12 hours/day   Adjusted rates with provide 60gm protein (75% lower end goal) and 1212 Kcals (75% lower Kcal goal)  Continue insulin in TPN to 25 units for reduce Dextrose in TPN  Hopefully can reduce TPN to ~50% needs on 7/1  TPN to contain standard multivitamins and trace element daily.  Continue IVF at 10 ml/hr.  Continue q4h CBGs and resistant SSI q4h;  TPN lab panels on Mondays & Thursdays   Doreene Eland, PharmD, BCPS.   Pager: [phone] 05/28/2018 10:05 AM

## 2018-05-28 NOTE — Progress Notes (Signed)
Subjective  Continues to pass lots of gas, had several BM's yesterday and is requesting we cut back on her bowel regimen.  Eating a few bites of food each meal.   Objective: Vital signs in last 24 hours: Temp:  [98.2 F (36.8 C)-99.6 F (37.6 C)] 98.2 F (36.8 C) (06/30 0454) Pulse Rate:  [96-115] 96 (06/30 0454) Resp:  [14-18] 14 (06/30 0454) BP: (98-136)/(64-84) 127/64 (06/30 0454) SpO2:  [97 %-98 %] 98 % (06/30 0454) Last BM Date: 05/27/18  Intake/Output from previous day: 06/29 0701 - 06/30 0700 In: 1609.2 [I.V.:1609.2] Out: -  Intake/Output this shift: No intake/output data recorded.  Gen: NAD, comfortable CV: RRR Pulm: Normal work of breathing Abd: Soft, obese, nontender, nondistended. Incisions c/d/i without erythema Ext: SCDs in place  Lab Results: CBC  Recent Labs    05/25/18 1015  WBC 12.4*  HGB 10.3*  HCT 31.4*  PLT 416*   BMET Recent Labs    05/26/18 0500 05/27/18 0500  NA 137 138  K 4.3 4.3  CL 104 106  CO2 26 25  GLUCOSE 253* 282*  BUN 16 20  CREATININE 0.71 0.77  CALCIUM 8.9 8.8*   PT/INR No results for input(s): LABPROT, INR in the last 72 hours. ABG No results for input(s): PHART, HCO3 in the last 72 hours.  Invalid input(s): PCO2, PO2  Studies/Results:  Anti-infectives: Anti-infectives (From admission, onward)   None       Assessment/Plan: Patient Active Problem List   Diagnosis Date Noted  . SBO (small bowel obstruction) and ileocolonic anastomosis from recent colectomy 05/22/2018  . Cancer of ascending colon - pT1N0 (0/21) s/p lap right colectomy 05/11/2018 05/21/2018  . Hypomagnesemia 05/21/2018  . Ileus following gastrointestinal surgery ([place]) 05/20/2018  . Essential hemorrhagic thrombocythemia ([place]) 03/29/2018  . Hypercalcemia 11/23/2015  . DDD (degenerative disc disease), lumbar 07/28/2015  . Spinal stenosis at L4-L5 level 06/12/2015  . Benign essential HTN 05/25/2015  . Bunion 05/25/2015  . Cardiac enlargement  05/25/2015  . Cervical radicular pain 05/25/2015  . Back pain, chronic 05/25/2015  . Osteoarthritis 05/25/2015  . Dyslipidemia 05/25/2015  . Gastric reflux 05/25/2015  . Insomnia 05/25/2015  . Eczema intertrigo 05/25/2015  . Bronchitis, chronic, mucopurulent ([place]) 05/25/2015  . Numerous moles 05/25/2015  . Morbid obesity ([place]) 05/25/2015  . Perennial allergic rhinitis 05/25/2015  . DM (diabetes mellitus), type 2, uncontrolled, with renal complications ([place]) 87/86/7672  . Snores 05/25/2015  . History of artificial joint 05/25/2015  . Degeneration of intervertebral disc of cervical region 02/06/2015  . Neuritis or radiculitis due to rupture of lumbar intervertebral disc 01/14/2015  . Abnormal presence of protein in urine 05/04/2010  . Vitamin D deficiency 05/04/2010   Cindy Griffin is a pleasant 17yoF with hx of HTN, DM, HLD s/p laparoscopic right hemicolectomy 05/11/18 for colon cancer - T1N0 (0/21 LNs) readmitted with most likely prolonged postoperative ileus POD#15  -Cont soft foods as tolerated with glucerna given DM -Avoid narcotics. Continue tylenol; added motrin and gabapentin. -DM - insulin with TPN -Hypomagnesemia - magnesium being adjusted in TPN -Ambulate 5x/day -PPx: Lov, SCDs, PPI   LOS: 8 days   Cindy Adie, MD  Colorectal and [facility]

## 2018-05-29 LAB — GLUCOSE, CAPILLARY
Glucose-Capillary: 117 mg/dL — ABNORMAL HIGH (ref 70–99)
Glucose-Capillary: 189 mg/dL — ABNORMAL HIGH (ref 70–99)
Glucose-Capillary: 218 mg/dL — ABNORMAL HIGH (ref 70–99)
Glucose-Capillary: 177 mg/dL — ABNORMAL HIGH (ref 70–99)
Glucose-Capillary: 136 mg/dL — ABNORMAL HIGH (ref 70–99)
Glucose-Capillary: 136 mg/dL — ABNORMAL HIGH (ref 70–99)

## 2018-05-29 LAB — COMPREHENSIVE METABOLIC PANEL
ALT: 57 U/L — ABNORMAL HIGH (ref 0–44)
AST: 34 U/L (ref 15–41)
Albumin: 2.8 g/dL — ABNORMAL LOW (ref 3.5–5.0)
Alkaline Phosphatase: 89 U/L (ref 38–126)
Anion gap: 9 (ref 5–15)
BUN: 16 mg/dL (ref 8–23)
CO2: 25 mmol/L (ref 22–32)
Calcium: 8.8 mg/dL — ABNORMAL LOW (ref 8.9–10.3)
Chloride: 105 mmol/L (ref 98–111)
Creatinine, Ser: 0.71 mg/dL (ref 0.44–1.00)
GFR calc Af Amer: >60 mL/min (ref >60)
GFR calc non Af Amer: >60 mL/min (ref >60)
Glucose, Bld: 150 mg/dL — ABNORMAL HIGH (ref 70–99)
Potassium: 4.1 mmol/L (ref 3.5–5.1)
Sodium: 139 mmol/L (ref 135–145)
Total Bilirubin: 0.3 mg/dL (ref 0.3–1.2)
Total Protein: 6.3 g/dL — ABNORMAL LOW (ref 6.5–8.1)

## 2018-05-29 LAB — DIFFERENTIAL
Basophils Absolute: 0 10*3/uL (ref 0.0–0.1)
Basophils Relative: 0 %
Eosinophils Absolute: 0.4 10*3/uL (ref 0.0–0.7)
Eosinophils Relative: 4 %
Lymphocytes Relative: 20 %
Lymphs Abs: 1.8 10*3/uL (ref 0.7–4.0)
Monocytes Absolute: 0.7 10*3/uL (ref 0.1–1.0)
Monocytes Relative: 8 %
Neutro Abs: 6 10*3/uL (ref 1.7–7.7)
Neutrophils Relative %: 68 %

## 2018-05-29 LAB — MAGNESIUM: Magnesium: 1.5 mg/dL — ABNORMAL LOW (ref 1.7–2.4)

## 2018-05-29 LAB — CBC
HCT: 28.2 % — ABNORMAL LOW (ref 36.0–46.0)
Hemoglobin: 9.3 g/dL — ABNORMAL LOW (ref 12.0–15.0)
MCH: 28.9 pg (ref 26.0–34.0)
MCHC: 33 g/dL (ref 30.0–36.0)
MCV: 87.6 fL (ref 78.0–100.0)
Platelets: 460 10*3/uL — ABNORMAL HIGH (ref 150–400)
RBC: 3.22 MIL/uL — ABNORMAL LOW (ref 3.87–5.11)
RDW: 14.4 % (ref 11.5–15.5)
WBC: 8.9 10*3/uL (ref 4.0–10.5)

## 2018-05-29 LAB — TRIGLYCERIDES: Triglycerides: 68 mg/dL (ref <150)

## 2018-05-29 LAB — PHOSPHORUS: Phosphorus: 4.3 mg/dL (ref 2.5–4.6)

## 2018-05-29 LAB — PREALBUMIN: Prealbumin: 16.8 mg/dL — ABNORMAL LOW (ref 18–38)

## 2018-05-29 MED ORDER — MAGNESIUM OXIDE 400 (241.3 MG) MG PO TABS
400.0000 mg | ORAL_TABLET | Freq: Once | ORAL | Status: AC
  Administered 2018-05-29: 400 mg via ORAL
  Filled 2018-05-29: qty 1

## 2018-05-29 MED ORDER — PIOGLITAZONE HCL 15 MG PO TABS
15.0000 mg | ORAL_TABLET | Freq: Every day | ORAL | Status: DC
  Administered 2018-05-29 – 2018-05-31 (×3): 15 mg via ORAL
  Filled 2018-05-29 (×3): qty 1

## 2018-05-29 MED ORDER — TRACE MINERALS CR-CU-MN-SE-ZN 10-1000-500-60 MCG/ML IV SOLN
INTRAVENOUS | Status: AC
  Administered 2018-05-29: 18:00:00 via INTRAVENOUS
  Filled 2018-05-29: qty 960

## 2018-05-29 MED ORDER — LOPERAMIDE HCL 2 MG PO CAPS
2.0000 mg | ORAL_CAPSULE | Freq: Three times a day (TID) | ORAL | Status: DC | PRN
Start: 2018-05-29 — End: 2018-05-30
  Administered 2018-05-29: 2 mg via ORAL
  Filled 2018-05-29: qty 1

## 2018-05-29 MED ORDER — MAGNESIUM SULFATE 4 GM/100ML IV SOLN
4.0000 g | Freq: Once | INTRAVENOUS | Status: AC
  Administered 2018-05-29: 4 g via INTRAVENOUS
  Filled 2018-05-29: qty 100

## 2018-05-29 MED ORDER — METFORMIN HCL ER 750 MG PO TB24
750.0000 mg | ORAL_TABLET | Freq: Every day | ORAL | Status: DC
  Administered 2018-05-29 – 2018-05-31 (×3): 750 mg via ORAL
  Filled 2018-05-29 (×3): qty 1

## 2018-05-29 MED ORDER — INSULIN ASPART 100 UNIT/ML ~~LOC~~ SOLN
0.0000 [IU] | SUBCUTANEOUS | Status: DC
  Administered 2018-05-29 (×2): 2 [IU] via SUBCUTANEOUS
  Administered 2018-05-30 (×3): 3 [IU] via SUBCUTANEOUS

## 2018-05-29 MED ORDER — GLIPIZIDE ER 5 MG PO TB24
10.0000 mg | ORAL_TABLET | Freq: Every day | ORAL | Status: DC
  Administered 2018-05-29 – 2018-05-31 (×3): 10 mg via ORAL
  Filled 2018-05-29 (×3): qty 2

## 2018-05-29 MED ORDER — FAT EMULSION PLANT BASED 20 % IV EMUL
120.0000 mL | INTRAVENOUS | Status: AC
  Administered 2018-05-29: 120 mL via INTRAVENOUS
  Filled 2018-05-29: qty 250

## 2018-05-29 NOTE — Progress Notes (Signed)
Pt walked to the bathroom had a small BM. When she came out she stated she didn't feel good. Stomach was cramping and she felt light headed. VS-98.1,132/74,p-98 and O2 sat 96%. She states she felt light headed. CBG-177. Cindy Griffin went back to bed to rest. She had been sitting up since 0700.

## 2018-05-29 NOTE — Progress Notes (Signed)
Subjective Continues to pass lots of gas, had several BMs Saturday, 1-2 yesterday and another this morning. Tolerating glucerna and what she estimates to be 40% of her trays. Early satiety but no emesis.   Objective: Vital signs in last 24 hours: Temp:  [97.8 F (36.6 C)-98.7 F (37.1 C)] 98.7 F (37.1 C) (07/01 0539) Pulse Rate:  [92-104] 101 (07/01 0539) Resp:  [14-20] 14 (07/01 0539) BP: (128-134)/(67-82) 133/67 (07/01 0539) SpO2:  [95 %-99 %] 95 % (07/01 0539) Last BM Date: 05/28/18  Intake/Output from previous day: 06/30 0701 - 07/01 0700 In: 2249.2 [P.O.:600; I.V.:1649.2] Out: -  Intake/Output this shift: No intake/output data recorded.  Gen: NAD, comfortable CV: RRR Pulm: Normal work of breathing Abd: Soft, obese, nontender, nondistended. Incisions c/d/i without erythema Ext: SCDs in place  Lab Results: CBC  Recent Labs    05/29/18 0525  WBC 8.9  HGB 9.3*  HCT 28.2*  PLT 460*   BMET Recent Labs    05/27/18 0500 05/29/18 0525  NA 138 139  K 4.3 4.1  CL 106 105  CO2 25 25  GLUCOSE 282* 150*  BUN 20 16  CREATININE 0.77 0.71  CALCIUM 8.8* 8.8*   PT/INR No results for input(s): LABPROT, INR in the last 72 hours. ABG No results for input(s): PHART, HCO3 in the last 72 hours.  Invalid input(s): PCO2, PO2  Studies/Results:  Anti-infectives: Anti-infectives (From admission, onward)   None       Assessment/Plan: Patient Active Problem List   Diagnosis Date Noted  . SBO (small bowel obstruction) and ileocolonic anastomosis from recent colectomy 05/22/2018  . Cancer of ascending colon - pT1N0 (0/21) s/p lap right colectomy 05/11/2018 05/21/2018  . Hypomagnesemia 05/21/2018  . Ileus following gastrointestinal surgery ([place]) 05/20/2018  . Essential hemorrhagic thrombocythemia ([place]) 03/29/2018  . Hypercalcemia 11/23/2015  . DDD (degenerative disc disease), lumbar 07/28/2015  . Spinal stenosis at L4-L5 level 06/12/2015  . Benign essential HTN  05/25/2015  . Bunion 05/25/2015  . Cardiac enlargement 05/25/2015  . Cervical radicular pain 05/25/2015  . Back pain, chronic 05/25/2015  . Osteoarthritis 05/25/2015  . Dyslipidemia 05/25/2015  . Gastric reflux 05/25/2015  . Insomnia 05/25/2015  . Eczema intertrigo 05/25/2015  . Bronchitis, chronic, mucopurulent ([place]) 05/25/2015  . Numerous moles 05/25/2015  . Morbid obesity ([place]) 05/25/2015  . Perennial allergic rhinitis 05/25/2015  . DM (diabetes mellitus), type 2, uncontrolled, with renal complications ([place]) 07/12/4817  . Snores 05/25/2015  . History of artificial joint 05/25/2015  . Degeneration of intervertebral disc of cervical region 02/06/2015  . Neuritis or radiculitis due to rupture of lumbar intervertebral disc 01/14/2015  . Abnormal presence of protein in urine 05/04/2010  . Vitamin D deficiency 05/04/2010   Cindy Griffin is a pleasant 77yoF with hx of HTN, DM, HLD s/p laparoscopic right hemicolectomy 05/11/18 for colon cancer - T1N0 (0/21 LNs) readmitted with most likely prolonged postoperative ileus  -Cont soft foods as tolerated with glucerna given DM - will work on weaning TPN today -Avoid narcotics. Continue tylenol, motrin and gabapentin. -DM - insulin with TPN -Hypomagnesemia - will give some by mouth today -Ambulate 5x/day -PPx: Lov, SCDs, PPI   LOS: 9 days   Sharon Mt. Dema Severin, M.D. [facility]

## 2018-05-29 NOTE — Progress Notes (Signed)
Nutrition Follow-up  DOCUMENTATION CODES:   Morbid obesity  INTERVENTION:   -TPN per Pharmacy  -Continue Glucerna Shake po TID, each supplement provides 220 kcal and 10 grams of protein  NUTRITION DIAGNOSIS:   Inadequate oral intake related to altered GI function, nausea, vomiting as evidenced by per patient/family report.  Improving.  GOAL:   Patient will meet greater than or equal to 90% of their needs  Progressing.  MONITOR:   PO intake, Supplement acceptance, Diet advancement, Weight trends, Labs, Other (Comment)(TPN regimen)   ASSESSMENT:   Patient with PMH significant for COPD, DM, HLD, and HTN. Recently discharged 6/17 after a laparscopic right hemicolectomy on 6/13.  At the time of discharge, she was passing gas, having bowel movements, eating a soft diet, and not having n/v. Presents this admission with nausea/vomiting. Found to have an ileus vs partial obstruction at anastomosis.   Patient consumed 25-50% of meals 6/30. Today tolerating meals better and drinking Glucerna shakes. Surgery to wean TPN today. Currently receiving Clinimix E5/15 at 73ml/hr with 20% ILE @ 15 ml/hr x 12 hours.  Weight is stable.  Medications: IV Zofran PRN Labs reviewed: CBGs: 189-218 Low Mg Phos WNL  Diet Order:   Diet Order           DIET SOFT Room service appropriate? Yes; Fluid consistency: Thin  Diet effective now          EDUCATION NEEDS:   Education needs have been addressed  Skin:  Skin Assessment: Skin Integrity Issues: Skin Integrity Issues:: Incisions Incisions: abdomen (6/13) and L shoulder (6/20)  Last BM:  6/25  Height:   Ht Readings from Last 1 Encounters:  05/20/18 5\' 4"  (1.626 m)    Weight:   Wt Readings from Last 1 Encounters:  05/29/18 240 lb 1.6 oz (108.9 kg)    Ideal Body Weight:  54.5 kg  BMI:  Body mass index is 41.21 kg/m.  Estimated Nutritional Needs:   Kcal:  1600-1800 kcal  Protein:  80-90 g  Fluid:  >1.6 L/day  Clayton Bibles, MS, RD, LDN [facility] Pager: [phone] After Hours Pager: [phone]

## 2018-05-29 NOTE — Progress Notes (Signed)
PHARMACY - ADULT TOTAL PARENTERAL NUTRITION CONSULT NOTE   Pharmacy Consult for TPN Indication: prolonged ileus  Patient Measurements: Body mass index is 40.6 kg/m. Filed Weights   05/20/18 0806 05/20/18 1327  Weight: 250 lb (113.4 kg) 236 lb 8.9 oz (107.3 kg)  Usual weight:  240 lbs  HPI: 9 yoF re-admitted on 6/22 s/p laparoscopic right hemicolectomy 05/11/18 for colon cancer. Re-admitted with likely prolonged postoperative ileus. Pharmacy is consulted for TPN.  Insulin requirements past 24 hours: 25units SSI Novolog last 24h, 25 units regular insulin in TPN  Current Nutrition: soft diet + glucerna  IVF: LR at 10 ml/hr  Central access:   PICC line 6/24 TPN start date:  6/24  Significant events:  - 6/13 lap right colectomy - failed clamping 6/25 - tolerated clamping 6/26 - NGT removed 6/27; repeating CT d/t low grade temps and tachycardia. Lots of flatus, 1x BM - 6/28: fevers noted although CT chest/pelvis clear; advancing to full liquids, Glucerna ordered - 6/29 Diet to soft, lots of gas, + BM 6/28 PM. Patient reports one episode of vomiting this am.  Trying to eat.  Plans on ordering lunch now on soft diet - 6/30 only taking a couple bites of food.  States only can tolerate a couple bite as not much of an appetite, also has complaints about how some of the food is cooked.   - 7/1 Ok to begin weaning TPN per [facility]. Resuming home metformin, glipizide and pioglitazone.  ASSESSMENT                                                                                                          Today:   Glucose: CBGs much improved with decreasing TPN for PO intake 6/29PM.  remains on resistant SSI with 25 units regular insulin in TPN; Resource breeze changed to Glucerna  Electrolytes:  All WNL except Mg low (1.5)  Renal: WNL  LFTs: ALT increased 15 >> 57, AST, AlkPhos and Tbili WNL  TGs: 59 (6/24), pending this AM  Prealbumin: 18.6 (6/24), pending this AM   NUTRITIONAL GOALS                                                                                              RD recs (6/28):  80-90 g Protein, 1600-1800 Kcal  Clinimix 5/15 at a goal rate of 75 ml/hr + 20% fat emulsion 240 ml/day to provide: 90 g of protein and 1758 kCals per day meeting 100 % of protein and 100 % of kCal needs   Keep Mg >/= 2.0 and K >/= 4.0 with suspected ileus  PLAN  Now: Magnesium sulfate 4g IV x 1  At 1800 today:  Decrease Clinimix E5/15 to 69ml/hr - Ok to wean per CCS  Decrease  20% fat emulsion to 10 ml/hr x12 hours/day   Adjusted rates with provide 48gm protein (60% lower end goal) and 802 Kcals (50% lower Kcal goal)  Decrease insulin in TPN to 10 units for reduced TPN rate and resumption of oral hypoglycemics  TPN to contain standard multivitamins and trace element daily.  Continue IVF at 10 ml/hr.  Continue q4h CBGs and resistant SSI q4h;  TPN lab panels on Mondays & Thursdays  F/u ability to stop TPN soon   Peggyann Juba, PharmD, BCPS Pager: [phone] 05/29/2018 6:55 AM

## 2018-05-29 NOTE — Progress Notes (Signed)
Pt ambulated in the hall with standby assist, tolerated well

## 2018-05-30 LAB — GLUCOSE, CAPILLARY
Glucose-Capillary: 114 mg/dL — ABNORMAL HIGH (ref 70–99)
Glucose-Capillary: 125 mg/dL — ABNORMAL HIGH (ref 70–99)
Glucose-Capillary: 157 mg/dL — ABNORMAL HIGH (ref 70–99)
Glucose-Capillary: 158 mg/dL — ABNORMAL HIGH (ref 70–99)
Glucose-Capillary: 171 mg/dL — ABNORMAL HIGH (ref 70–99)
Glucose-Capillary: 246 mg/dL — ABNORMAL HIGH (ref 70–99)

## 2018-05-30 LAB — BASIC METABOLIC PANEL
Anion gap: 8 (ref 5–15)
BUN: 15 mg/dL (ref 8–23)
CO2: 26 mmol/L (ref 22–32)
Calcium: 8.9 mg/dL (ref 8.9–10.3)
Chloride: 108 mmol/L (ref 98–111)
Creatinine, Ser: 0.68 mg/dL (ref 0.44–1.00)
GFR calc Af Amer: >60 mL/min (ref >60)
GFR calc non Af Amer: >60 mL/min (ref >60)
Glucose, Bld: 156 mg/dL — ABNORMAL HIGH (ref 70–99)
Potassium: 4 mmol/L (ref 3.5–5.1)
Sodium: 142 mmol/L (ref 135–145)

## 2018-05-30 LAB — MAGNESIUM: Magnesium: 1.8 mg/dL (ref 1.7–2.4)

## 2018-05-30 LAB — PHOSPHORUS: Phosphorus: 3.9 mg/dL (ref 2.5–4.6)

## 2018-05-30 MED ORDER — INSULIN ASPART 100 UNIT/ML ~~LOC~~ SOLN
0.0000 [IU] | Freq: Every day | SUBCUTANEOUS | Status: DC
  Administered 2018-05-30: 0 [IU] via SUBCUTANEOUS

## 2018-05-30 MED ORDER — MAGNESIUM SULFATE 2 GM/50ML IV SOLN
2.0000 g | Freq: Once | INTRAVENOUS | Status: AC
  Administered 2018-05-30: 2 g via INTRAVENOUS
  Filled 2018-05-30: qty 50

## 2018-05-30 MED ORDER — METOCLOPRAMIDE HCL 10 MG PO TABS
10.0000 mg | ORAL_TABLET | Freq: Three times a day (TID) | ORAL | Status: DC
  Administered 2018-05-30 – 2018-05-31 (×4): 10 mg via ORAL
  Filled 2018-05-30 (×4): qty 1

## 2018-05-30 MED ORDER — INSULIN ASPART 100 UNIT/ML ~~LOC~~ SOLN
0.0000 [IU] | Freq: Three times a day (TID) | SUBCUTANEOUS | Status: DC
  Administered 2018-05-30: 5 [IU] via SUBCUTANEOUS
  Administered 2018-05-30: 2 [IU] via SUBCUTANEOUS
  Administered 2018-05-31: 3 [IU] via SUBCUTANEOUS

## 2018-05-30 NOTE — Consult Note (Signed)
   [facility] Inpatient Consult   05/30/2018  Cindy Griffin March 18, 1946 [id number]   [facility] Liaison follow up.  Spoke with Cindy Griffin at bedside to confirm whether she is going to her daughter's house at discharge or home. States she still is not sure. However, she states she and her daughter live about 5 miles apart. Discussed that writer will go ahead and make referral to [facility] as well. Cindy Griffin is already being assisted by [facility] team.   Please see initial hospital liaison consult note on 05/25/18 for further details.   Confirmed best contact number for Cindy Griffin as [phone].   Will make referral to [facility] for follow up as well post discharge.   Cindy Griffin has unplanned readmission risk score of 22% (high) and medical history of COD, DM, HLD. She is currently on TPN.    Marthenia Rolling, MSN-Ed, RN,BSN [facility] Liaison [phone]

## 2018-05-30 NOTE — Progress Notes (Signed)
Subjective Continues to pass lots of gas, having multiple BMs. Was given dose of imodium last night, but that was discontinued this morning. Tolerating glucerna and what she estimates to be 40% of her trays. Denies n/v. Doesn't like the food here which she describes as being the issue with her intake.  Objective: Vital signs in last 24 hours: Temp:  [98.1 F (36.7 C)-99.1 F (37.3 C)] 99 F (37.2 C) (07/02 0450) Pulse Rate:  [98-101] 99 (07/02 0450) Resp:  [18] 18 (07/02 0450) BP: (132-146)/(74-76) 146/76 (07/02 0450) SpO2:  [96 %-99 %] 97 % (07/02 0450) Weight:  [108.9 kg (240 lb 1.6 oz)] 108.9 kg (240 lb 1.6 oz) (07/01 0945) Last BM Date: 05/29/18  Intake/Output from previous day: 07/01 0701 - 07/02 0700 In: 1542.2 [P.O.:837; I.V.:705.2] Out: -  Intake/Output this shift: No intake/output data recorded.  Gen: NAD, comfortableExt: SCDs in place CV: RRR Pulm: Normal work of breathing Abd: Soft, obese, nontender, nondistended. Incisions c/d/i without erythema   Lab Results: CBC  Recent Labs    05/29/18 0525  WBC 8.9  HGB 9.3*  HCT 28.2*  PLT 460*   BMET Recent Labs    05/29/18 0525 05/30/18 0355  NA 139 142  K 4.1 4.0  CL 105 108  CO2 25 26  GLUCOSE 150* 156*  BUN 16 15  CREATININE 0.71 0.68  CALCIUM 8.8* 8.9   PT/INR No results for input(s): LABPROT, INR in the last 72 hours. ABG No results for input(s): PHART, HCO3 in the last 72 hours.  Invalid input(s): PCO2, PO2  Studies/Results:  Anti-infectives: Anti-infectives (From admission, onward)   None       Assessment/Plan: Patient Active Problem List   Diagnosis Date Noted  . SBO (small bowel obstruction) and ileocolonic anastomosis from recent colectomy 05/22/2018  . Cancer of ascending colon - pT1N0 (0/21) s/p lap right colectomy 05/11/2018 05/21/2018  . Hypomagnesemia 05/21/2018  . Ileus following gastrointestinal surgery ([place]) 05/20/2018  . Essential hemorrhagic thrombocythemia ([place])  03/29/2018  . Hypercalcemia 11/23/2015  . DDD (degenerative disc disease), lumbar 07/28/2015  . Spinal stenosis at L4-L5 level 06/12/2015  . Benign essential HTN 05/25/2015  . Bunion 05/25/2015  . Cardiac enlargement 05/25/2015  . Cervical radicular pain 05/25/2015  . Back pain, chronic 05/25/2015  . Osteoarthritis 05/25/2015  . Dyslipidemia 05/25/2015  . Gastric reflux 05/25/2015  . Insomnia 05/25/2015  . Eczema intertrigo 05/25/2015  . Bronchitis, chronic, mucopurulent ([place]) 05/25/2015  . Numerous moles 05/25/2015  . Morbid obesity ([place]) 05/25/2015  . Perennial allergic rhinitis 05/25/2015  . DM (diabetes mellitus), type 2, uncontrolled, with renal complications ([place]) 38/25/0539  . Snores 05/25/2015  . History of artificial joint 05/25/2015  . Degeneration of intervertebral disc of cervical region 02/06/2015  . Neuritis or radiculitis due to rupture of lumbar intervertebral disc 01/14/2015  . Abnormal presence of protein in urine 05/04/2010  . Vitamin D deficiency 05/04/2010   Ms. Magnussen is a pleasant 53yoF with hx of HTN, DM, HLD s/p laparoscopic right hemicolectomy 05/11/18 for colon cancer - T1N0 (0/21 LNs) readmitted with most likely prolonged postoperative ileus  -Cont soft foods as tolerated with glucerna given DM - tpn wean underway. Doesn't like the food here. Adding reglan 10mg  PO QID - AC/HS for possible underlying gastroparesis as well -Avoid narcotics. Continue tylenol, motrin and gabapentin. -DM - insulin with TPN -Hypomagnesemia - replace today with 2g IV -Ambulate 5x/day -PPx: Lov, SCDs, PPI   LOS: 10 days   Sharon Mt. Dema Severin, M.D.  [facility]

## 2018-05-30 NOTE — Progress Notes (Signed)
PHARMACY - ADULT TOTAL PARENTERAL NUTRITION CONSULT NOTE   Pharmacy Consult for TPN Indication: prolonged ileus  Patient Measurements: Body mass index is 41.21 kg/m. Filed Weights   05/20/18 0806 05/20/18 1327 05/29/18 0945  Weight: 250 lb (113.4 kg) 236 lb 8.9 oz (107.3 kg) 240 lb 1.6 oz (108.9 kg)  Usual weight:  240 lbs  HPI: 20 yoF re-admitted on 6/22 s/p laparoscopic right hemicolectomy 05/11/18 for colon cancer. Re-admitted with likely prolonged postoperative ileus. Pharmacy is consulted for TPN.  Insulin requirements past 24 hours: 17units SSI Novolog last 24h, 10 units regular insulin in TPN  Current Nutrition: soft diet + glucerna  IVF: LR at 10 ml/hr  Central access:   PICC line 6/24 TPN start date:  6/24  Significant events:  - 6/13 lap right colectomy - failed clamping 6/25 - tolerated clamping 6/26 - NGT removed 6/27; repeating CT d/t low grade temps and tachycardia. Lots of flatus, 1x BM - 6/28: fevers noted although CT chest/pelvis clear; advancing to full liquids, Glucerna ordered - 6/29 Diet to soft, lots of gas, + BM 6/28 PM. Patient reports one episode of vomiting this am.  Trying to eat.  Plans on ordering lunch now on soft diet - 6/30 only taking a couple bites of food.  States only can tolerate a couple bite as not much of an appetite, also has complaints about how some of the food is cooked.   - 7/1 Ok to begin weaning TPN per [facility]. Resuming home metformin, glipizide and pioglitazone. - 7/2: Discussed with Dr. Dema Severin, ok to stop TPN/lipids after today's bag at [facility].  ASSESSMENT                                                                                                          Today:   Glucose: CBGs improved with decreasing TPN for PO intake 6/29PM.   Electrolytes:  All WNL, Mg improved after replacement 7/1, MD replacing again today.  Renal: WNL  LFTs: ALT increased 15 >> 57, AST, AlkPhos and Tbili WNL  TGs: 59 (6/24), 68 (7/1)  Prealbumin: 18.6  (6/24), 16.8 (7/1)   NUTRITIONAL GOALS                                                                                             RD recs (6/28):  80-90 g Protein, 1600-1800 Kcal  Clinimix 5/15 at a goal rate of 75 ml/hr + 20% fat emulsion 240 ml/day to provide: 90 g of protein and 1758 kCals per day meeting 100 % of protein and 100 % of kCal needs   Keep Mg >/= 2.0 and K >/= 4.0 with suspected ileus  PLAN  Now: Magnesium sulfate 2g IV x 1 per MD  At 1800 today:  Decrease Clinimix E5/15 to 61ml/hr and stop at 6PM tonight  Continue IVF at 10 ml/hr per [facility]  Change CBGs/SSI to ACHS moderate scale.  TPN lab panels on Mondays & Thursdays  Peggyann Juba, PharmD, BCPS Pager: [phone] 05/30/2018 8:45 AM

## 2018-05-31 LAB — GLUCOSE, CAPILLARY: Glucose-Capillary: 154 mg/dL — ABNORMAL HIGH (ref 70–99)

## 2018-05-31 MED ORDER — METOCLOPRAMIDE HCL 10 MG PO TABS: 10.0000 mg | ORAL_TABLET | Freq: Three times a day (TID) | ORAL | 1 refills | Status: DC

## 2018-05-31 NOTE — Progress Notes (Addendum)
Subjective Feels like shes doing much better overall. Tolerating glucerna. Ate pancakes/syrup for breakfast, most of beef pot roast and mashed potatoes for lunch, hamburger for dinner and tolerated well. Denies n/v. Passing flatus, having BMs. Reglan she believes has helped a lot.  Objective: Vital signs in last 24 hours: Temp:  [98 F (36.7 C)-99.2 F (37.3 C)] 98.5 F (36.9 C) (07/03 0527) Pulse Rate:  [98-106] 106 (07/03 0527) Resp:  [16-20] 20 (07/03 0527) BP: (127-145)/(68-86) 145/72 (07/03 0527) SpO2:  [95 %-100 %] 96 % (07/03 0527) Last BM Date: 05/29/18  Intake/Output from previous day: 07/02 0701 - 07/03 0700 In: 840 [P.O.:840] Out: -  Intake/Output this shift: No intake/output data recorded.  Gen: NAD, comfortableExt: SCDs in place CV: RRR Pulm: Normal work of breathing Abd: Soft, obese, nontender, nondistended. Incisions c/d/i without erythema  Lab Results: CBC  Recent Labs    05/29/18 0525  WBC 8.9  HGB 9.3*  HCT 28.2*  PLT 460*   BMET Recent Labs    05/29/18 0525 05/30/18 0355  NA 139 142  K 4.1 4.0  CL 105 108  CO2 25 26  GLUCOSE 150* 156*  BUN 16 15  CREATININE 0.71 0.68  CALCIUM 8.8* 8.9   PT/INR No results for input(s): LABPROT, INR in the last 72 hours. ABG No results for input(s): PHART, HCO3 in the last 72 hours.  Invalid input(s): PCO2, PO2  Studies/Results:  Anti-infectives: Anti-infectives (From admission, onward)   None       Assessment/Plan: Patient Active Problem List   Diagnosis Date Noted  . SBO (small bowel obstruction) and ileocolonic anastomosis from recent colectomy 05/22/2018  . Cancer of ascending colon - pT1N0 (0/21) s/p lap right colectomy 05/11/2018 05/21/2018  . Hypomagnesemia 05/21/2018  . Ileus following gastrointestinal surgery ([place]) 05/20/2018  . Essential hemorrhagic thrombocythemia ([place]) 03/29/2018  . Hypercalcemia 11/23/2015  . DDD (degenerative disc disease), lumbar 07/28/2015  . Spinal stenosis  at L4-L5 level 06/12/2015  . Benign essential HTN 05/25/2015  . Bunion 05/25/2015  . Cardiac enlargement 05/25/2015  . Cervical radicular pain 05/25/2015  . Back pain, chronic 05/25/2015  . Osteoarthritis 05/25/2015  . Dyslipidemia 05/25/2015  . Gastric reflux 05/25/2015  . Insomnia 05/25/2015  . Eczema intertrigo 05/25/2015  . Bronchitis, chronic, mucopurulent ([place]) 05/25/2015  . Numerous moles 05/25/2015  . Morbid obesity ([place]) 05/25/2015  . Perennial allergic rhinitis 05/25/2015  . DM (diabetes mellitus), type 2, uncontrolled, with renal complications ([place]) 66/29/4765  . Snores 05/25/2015  . History of artificial joint 05/25/2015  . Degeneration of intervertebral disc of cervical region 02/06/2015  . Neuritis or radiculitis due to rupture of lumbar intervertebral disc 01/14/2015  . Abnormal presence of protein in urine 05/04/2010  . Vitamin D deficiency 05/04/2010   Ms. Cotterman is a pleasant 82yoF with hx of HTN, DM, HLD s/p laparoscopic right hemicolectomy 05/11/18 for colon cancer - T1N0 (0/21 LNs) readmitted with most likely prolonged postoperative ileus  -Cont soft foods as tolerated with glucerna given DM - tpn wean underway. Doesn't like the food here. Adding reglan 10mg  PO QID - AC/HS for possible underlying gastroparesis as well -Avoid narcotics. Continue tylenol, motrin and gabapentin. -DM - restarted home meds -Ambulate 5x/day -PPx: Lov, SCDs, PPI -Possible discharge later today   LOS: 11 days   Helia Haese M. Dema Severin, M.D. [facility]

## 2018-05-31 NOTE — Discharge Instructions (Signed)
POST OP INSTRUCTIONS  1. DIET: As tolerated - avoid raw fruits and vegetables for the next 3-4 weeks. Be sure to include lots of fluids daily.  Avoid fast food or heavy meals as your are more likely to get nauseated.  2. Take your usually prescribed home medications unless otherwise directed.  3. PAIN CONTROL: a. Pain is best controlled by a usual combination of three different methods TOGETHER: i. Ice/Heat ii. Over the counter pain medication b. Most patients will experience some swelling and bruising around the surgical site.  Ice packs or heating pads (30-60 minutes up to 6 times a day) will help. Some people prefer to use ice alone, heat alone, alternating between ice & heat.  Experiment to what works for you.  Swelling and bruising can take several weeks to resolve.   c. It is helpful to take an over-the-counter pain medication regularly for the first few weeks: i. Ibuprofen (Motrin/Advil) - 200mg  tabs - take 3 tabs (600mg ) every 6 hours as needed for pain ii. Acetaminophen (Tylenol) - you may take 650mg  every 6 hours as needed. You can take this with motrin as they act differently on the body.  Iii. NOTE: You may take both of these medications together - most patients  find it most helpful when alternating between the two (i.e. Ibuprofen at 6am,  tylenol at 9am, ibuprofen at 12pm ..[name])  Avoid getting constipated. Increasing fluid intake and continue once daily Miralax.   4. Dressing: Your incision is covered in Dermabond which is like sterile superglue for the skin. This will come off on it's own in a couple weeks. It is waterproof and you may bathe normally starting the day after your surgery in a shower. Avoid baths/pools/lakes/oceans until your wounds have fully healed.  5. ACTIVITIES as tolerated:   a. Avoid heavy lifting (>10lbs or 1 gallon of milk) for the next 6 weeks. b. You may resume regular (light) daily activities beginning the next day--such as daily self-care, walking,  climbing stairs--gradually increasing activities as tolerated.  If you can walk 30 minutes without difficulty, it is safe to try more intense activity such as jogging, treadmill, bicycling, low-impact aerobics.  c. DO NOT PUSH THROUGH PAIN.  Let pain be your guide: If it hurts to do something, don't do it. d. Dennis Bast may drive when you are no longer taking prescription pain medication, you can comfortably wear a seatbelt, and you can safely maneuver your car and apply brakes.  6. FOLLOW UP in our office a. Please call [facility] at [phone] to set up an appointment to see your surgeon in the office for a follow-up appointment approximately 2 weeks after your surgery. b. Make sure that you call for this appointment the day you arrive home to insure a convenient appointment time.  9. If you have disability or family leave forms that need to be completed, you may have them completed by your primary care physician's office; for return to work instructions, please ask our office staff and they will be happy to assist you in obtaining this documentation   When to call us [phone]: 1. Poor pain control 2. Reactions / problems with new medications (rash/itching, etc)  3. Fever over 101.5 F (38.5 C) 4. Inability to urinate 5. Nausea/vomiting 6. Worsening swelling or bruising 7. Continued bleeding from incision. 8. Increased pain, redness, or drainage from the incision  The clinic staff is available to answer your questions during regular business hours (8:30am-5pm).  Please dont hesitate  to call and ask to speak to one of our nurses for clinical concerns.   A surgeon from [facility] is always on call at the hospitals   If you have a medical emergency, go to the nearest emergency room or call 911.  [facility], [place] [address] [place], [place], [place]  [address] MAIN: [phone] FAX: [phone] [email or web address]

## 2018-05-31 NOTE — Progress Notes (Signed)
Discharge instructions reviewed with patient. Patient verbalized understanding. 

## 2018-05-31 NOTE — Discharge Summary (Signed)
Patient ID: Cindy Griffin MRN: [id number] DOB/AGE: 72-Mar-1947 72 y.o.  Admit date: 05/20/2018 Discharge date: 05/31/2018  Discharge Diagnoses Patient Active Problem List   Diagnosis Date Noted  . SBO (small bowel obstruction) and ileocolonic anastomosis from recent colectomy 05/22/2018  . Cancer of ascending colon - pT1N0 (0/21) s/p lap right colectomy 05/11/2018 05/21/2018  . Hypomagnesemia 05/21/2018  . Ileus following gastrointestinal surgery ([place]) 05/20/2018  . Essential hemorrhagic thrombocythemia ([place]) 03/29/2018  . Hypercalcemia 11/23/2015  . DDD (degenerative disc disease), lumbar 07/28/2015  . Spinal stenosis at L4-L5 level 06/12/2015  . Benign essential HTN 05/25/2015  . Bunion 05/25/2015  . Cardiac enlargement 05/25/2015  . Cervical radicular pain 05/25/2015  . Back pain, chronic 05/25/2015  . Osteoarthritis 05/25/2015  . Dyslipidemia 05/25/2015  . Gastric reflux 05/25/2015  . Insomnia 05/25/2015  . Eczema intertrigo 05/25/2015  . Bronchitis, chronic, mucopurulent ([place]) 05/25/2015  . Numerous moles 05/25/2015  . Morbid obesity ([place]) 05/25/2015  . Perennial allergic rhinitis 05/25/2015  . DM (diabetes mellitus), type 2, uncontrolled, with renal complications ([place]) 22/29/7989  . Snores 05/25/2015  . History of artificial joint 05/25/2015  . Degeneration of intervertebral disc of cervical region 02/06/2015  . Neuritis or radiculitis due to rupture of lumbar intervertebral disc 01/14/2015  . Abnormal presence of protein in urine 05/04/2010  . Vitamin D deficiency 05/04/2010   Hospital Course: She was readmitted to the hospital 05/20/18 with prolonged ileus vs pSBO. Ultimately she had a NG tube placed on HD#2 for decompression. She progressed appropriately although slowly. PICC line was placed shortly after admission for initiation of TPN. She was passing flatus throughout the entire admission and began having bowel function. Her NG tube was clamped and successfully removed.  Her diet was gently advanced. She was tolerating liquids and advanced to solids. Initially her intake was poor but she continued to have good bowel function. Reglan was started and her appetite improved dramatically. Her TPN was weaned and PICC removed. She was then discharged 05/31/18 after having good oral intake and bowel function.  Allergies as of 05/31/2018   No Known Allergies     Medication List    TAKE these medications   ACCU-CHEK NANO SMARTVIEW w/Device Kit 1 Device by Does not apply route 3 (three) times daily.   acetaminophen 500 MG tablet Commonly known as:  TYLENOL Take 1,000 mg by mouth every 6 (six) hours as needed for moderate pain or headache.   albuterol 108 (90 Base) MCG/ACT inhaler Commonly known as:  PROVENTIL HFA;VENTOLIN HFA INHALE 2 PUFFS INTO LUNGS EVERY 6 HOURS AS NEEDED FOR WHEEZING OR SHORTNESS OF BREATH   aspirin EC 81 MG tablet Take 1 tablet (81 mg total) by mouth daily.   atorvastatin 80 MG tablet Commonly known as:  LIPITOR Take 1 tablet (80 mg total) by mouth daily. What changed:  when to take this   celecoxib 200 MG capsule Commonly known as:  CELEBREX TAKE 1 CAPSULE BY MOUTH ONCE DAILY   cetirizine 10 MG tablet Commonly known as:  ZYRTEC Take 10 mg by mouth daily as needed for allergies.   ferrous sulfate 325 (65 FE) MG EC tablet Take 325 mg by mouth daily.   fluticasone 50 MCG/ACT nasal spray Commonly known as:  FLONASE Place 2 sprays into both nostrils daily. What changed:    when to take this  reasons to take this   GLIPIZIDE XL 10 MG 24 hr tablet Generic drug:  glipiZIDE TAKE 1 TABLET BY MOUTH ONCE DAILY  WITH BREAKFAST   glucose blood test strip Commonly known as:  ADVOCATE REDI-CODE 1 each by Other route QID.   hydrochlorothiazide 25 MG tablet Commonly known as:  HYDRODIURIL Take 1 tablet (25 mg total) by mouth daily.   lisinopril 40 MG tablet Commonly known as:  PRINIVIL,ZESTRIL Take 1 tablet (40 mg total) by mouth  daily. What changed:  when to take this   LUBRICATING EYE DROPS OP Place 2 drops into both eyes 2 (two) times daily as needed (for dry eyes).   metFORMIN 750 MG 24 hr tablet Commonly known as:  GLUCOPHAGE-XR Take 1 tablet (750 mg total) by mouth daily with breakfast.   metoCLOPramide 10 MG tablet Commonly known as:  REGLAN Take 1 tablet (10 mg total) by mouth 4 (four) times daily -  before meals and at bedtime.   MULTIVITAMIN PO Take 1 tablet by mouth daily.   OMEGA-3 CF PO Take 660 mg by mouth daily. Over the counter product   ondansetron 4 MG disintegrating tablet Commonly known as:  ZOFRAN-ODT DISSOLVE 1 TABLET IN MOUTH EVERY 8 HOURS AS NEEDED FOR NAUSEA AND VOMITING   pioglitazone 15 MG tablet Commonly known as:  ACTOS Take 1 tablet (15 mg total) by mouth daily.   ranitidine 150 MG tablet Commonly known as:  ZANTAC TAKE 1 TABLET BY MOUTH TWICE DAILY. REPLACES OMEPRAZOLE. What changed:    how much to take  how to take this  when to take this  reasons to take this  additional instructions   Semaglutide 1 MG/DOSE Sopn Commonly known as:  OZEMPIC Inject 0.5-1 mg into the skin once a week. What changed:    how much to take  when to take this   umeclidinium-vilanterol 62.5-25 MCG/INH Aepb Commonly known as:  ANORO ELLIPTA Inhale 1 puff into the lungs daily.   vitamin C 1000 MG tablet Take 1,000 mg by mouth daily.   Vitamin D-3 1000 units Caps Take 1,000 Units by mouth daily.          Sharon Mt. Dema Severin, M.D. [facility]

## 2018-06-02 ENCOUNTER — Other Ambulatory Visit: Payer: Self-pay

## 2018-06-02 NOTE — Patient Outreach (Signed)
Cindy Griffin) Care Management  06/02/2018  Cindy Griffin September 15, 1946 [id number]   TOC will be completed by PCP and office will refer to [facility] as needed.  Susscssful initial telephone outreach encounter to the above patient, referred to [facility] Case Management for DM, hGB 7.9 after recent hospitalization 05/20/18-73/19 for illius/partial bowel obstruction related to recent surgery for colon CA. Patient was discharged to home for self-care without [place]. HIPAA identifiers verified by patient. [facility] Case Management services were discussed with patient and patient gave verbal consent for participation in the [facility] Case Management program. Written consent obtained 05/25/18 while hospitalized.  Patient reports  doing well. She continues to tolerated oral intake without N/V and had a small BM this morning. She acknowledges to passing gas frequently.  Medications: --Patient states has all medications and takes as prescribed. --Patient verbalizes ability to self manage medications. --Medication review not competed related to time constraints. Patient on the way out the door to return to her daughters home where she is residing intermittently during recovery.  Follow-up Appointments: --Appointments reviewed in EMR. --All upcomming appointments reviewed with patient during this encounter.  --Patient reports follow-up appointments as follows.    [facility] Monday 7/8 19 at 1pm  Safety/Mobility/Falls: --Patient denies new or recent falls --Assistive devices used by patient (None)  Social/Community Resources Needed: --Patient currently denies needs for additional resources. She owns her own home, drives herself to all appointments. Her daughter and son are providing transportation until patient recovers from recent surgery.  Advanced Directives: --Patient states she currently has Advanced Directives in place. RN CM  confirmed AD are in EMR. HCPOAs are patients daughter, Curly Rim and patient's sister.  Patient denied additional issues or concerns other than stated above. RN Case Managers contact information was provided verbally along with the main [facility] CM office number and the 24 hour nurse advice line.  Plan: -- Initial Home visit scheduled for Tuesday, July 9 at 10:00 --Will send Psychologist, forensic to PCP stating [facility] Case Management is involved in patients care.  Darria Corvera E. Rollene Rotunda RN, BSN [facility] Coordinator [phone]

## 2018-06-06 ENCOUNTER — Other Ambulatory Visit: Payer: Self-pay

## 2018-06-06 NOTE — Patient Outreach (Signed)
[facility]) Care Management   06/06/2018  Cindy Griffin [birth year] [id number]  Cindy Griffin is an 72 y.o. female  Subjective:  Patient states she is doing well since her hospitalization. States she is "a little sore". States she had a follow-up yesterday with surgeon who said she was doing well. No other follow-up until December. "I cannot have any nuts, seeds, fresh fruit, or grains for 2-4 weeks". "I have not checked my blood sugar since my surgery in June". Objective:   BP (!) 118/58 (BP Location: Left Arm, Patient Position: Sitting, Cuff Size: Large)   Pulse 95   Resp 18   Wt 226 lb (102.5 kg) Comment: reported from Md appt yesterday  SpO2 98%   BMI 38.79 kg/m   Physical Exam  Constitutional: She is oriented to person, place, and time. She appears well-developed and well-nourished.  Cardiovascular: Normal rate, regular rhythm, normal heart sounds and intact distal pulses.  Respiratory: Effort normal and breath sounds normal.  GI: Soft. Bowel sounds are normal.  Mild distension, tenderness at laproscopic incision sites. Sites well approximated, dry and intact without any drainage. Patient admits to passing gas, burping, and tolerating solid foods. Small solid BM this morning. Denies N/V/D  Musculoskeletal: Normal range of motion.  Neurological: She is alert and oriented to person, place, and time.  Skin: Skin is warm and dry.  Psychiatric: She has a normal mood and affect. Her behavior is normal. Judgment and thought content normal.    Encounter Medications:   Outpatient Encounter Medications as of 06/06/2018  Medication Sig  . acetaminophen (TYLENOL) 500 MG tablet Take 1,000 mg by mouth every 6 (six) hours as needed for moderate pain or headache.   . Ascorbic Acid (VITAMIN C) 1000 MG tablet Take 1,000 mg by mouth daily.  [name] aspirin EC 81 MG tablet Take 1 tablet (81 mg total) by mouth daily.  [name] atorvastatin (LIPITOR) 80 MG tablet Take 1 tablet (80 mg total) by  mouth daily. (Patient taking differently: Take 80 mg by mouth at bedtime. )  . Carboxymethylcellul-Glycerin (LUBRICATING EYE DROPS OP) Place 2 drops into both eyes 2 (two) times daily as needed (for dry eyes).  . celecoxib (CELEBREX) 200 MG capsule TAKE 1 CAPSULE BY MOUTH ONCE DAILY  . cetirizine (ZYRTEC) 10 MG tablet Take 10 mg by mouth daily as needed for allergies.   . Cholecalciferol (VITAMIN D-3) 1000 units CAPS Take 1,000 Units by mouth daily.   . ferrous sulfate 325 (65 FE) MG EC tablet Take 325 mg by mouth daily.   [name] GLIPIZIDE XL 10 MG 24 hr tablet TAKE 1 TABLET BY MOUTH ONCE DAILY WITH BREAKFAST  . hydrochlorothiazide (HYDRODIURIL) 25 MG tablet Take 1 tablet (25 mg total) by mouth daily.  [name] lisinopril (PRINIVIL,ZESTRIL) 40 MG tablet Take 1 tablet (40 mg total) by mouth daily. (Patient taking differently: Take 40 mg by mouth at bedtime. )  . metFORMIN (GLUCOPHAGE-XR) 750 MG 24 hr tablet Take 1 tablet (750 mg total) by mouth daily with breakfast.  . metoCLOPramide (REGLAN) 10 MG tablet Take 1 tablet (10 mg total) by mouth 4 (four) times daily -  before meals and at bedtime.  . Multiple Vitamins-Minerals (MULTIVITAMIN PO) Take 1 tablet by mouth daily.  . Omega-3 Fatty Acids (OMEGA-3 CF PO) Take 660 mg by mouth daily. Over the counter product  . pioglitazone (ACTOS) 15 MG tablet Take 1 tablet (15 mg total) by mouth daily.  . ranitidine (ZANTAC) 150 MG tablet  TAKE 1 TABLET BY MOUTH TWICE DAILY. REPLACES OMEPRAZOLE.  . Semaglutide (OZEMPIC) 1 MG/DOSE SOPN Inject 0.5-1 mg into the skin once a week. (Patient taking differently: Inject 0.5 mg into the skin every Monday. )  . umeclidinium-vilanterol (ANORO ELLIPTA) 62.5-25 MCG/INH AEPB Inhale 1 puff into the lungs daily.  [name] albuterol (PROVENTIL HFA;VENTOLIN HFA) 108 (90 Base) MCG/ACT inhaler INHALE 2 PUFFS INTO LUNGS EVERY 6 HOURS AS NEEDED FOR WHEEZING OR SHORTNESS OF BREATH (Patient not taking: Reported on 06/06/2018)  . Blood Glucose Monitoring  Suppl (ACCU-CHEK NANO SMARTVIEW) W/DEVICE KIT 1 Device by Does not apply route 3 (three) times daily. (Patient not taking: Reported on 06/06/2018)  . fluticasone (FLONASE) 50 MCG/ACT nasal spray Place 2 sprays into both nostrils daily. (Patient not taking: Reported on 06/06/2018)  . glucose blood (ADVOCATE REDI-CODE) test strip 1 each by Other route QID. (Patient not taking: Reported on 06/06/2018)  . ondansetron (ZOFRAN-ODT) 4 MG disintegrating tablet DISSOLVE 1 TABLET IN MOUTH EVERY 8 HOURS AS NEEDED FOR NAUSEA AND VOMITING   No facility-administered encounter medications on file as of 06/06/2018.     Functional Status:   In your present state of health, do you have any difficulty performing the following activities: 06/06/2018 05/20/2018  Hearing? N N  Comment - -  Vision? N N  Comment - -  Difficulty concentrating or making decisions? N N  Walking or climbing stairs? N N  Comment - -  Dressing or bathing? N N  Doing errands, shopping? N N  Preparing Food and eating ? N -  Comment - -  Using the Toilet? N -  In the past six months, have you accidently leaked urine? N -  Do you have problems with loss of bowel control? N -  Managing your Medications? N -  Managing your Finances? N -  Housekeeping or managing your Housekeeping? N -  Some recent data might be hidden    Fall/Depression Screening:    Fall Risk  06/06/2018 03/29/2018 02/24/2018  Falls in the past year? No No No  Number falls in past yr: - - -  Injury with Fall? - - -  Comment - - -  Risk for fall due to : - - Impaired balance/gait;Impaired vision  Risk for fall due to: Comment - - knee pain; arthritis; wears eyeglasses  Follow up - - -   PHQ 2/9 Scores 06/06/2018 02/24/2018 03/14/2017 11/11/2016 07/15/2016 03/15/2016 11/21/2015  PHQ - 2 Score 2 1 0 0 0 0 0  PHQ- 9 Score 5 4 - - - - -    Assessment:  Patient appears to be doing well since her discharge from [facility] for small bowel obstruction. Physical Assessment as documented  above. She is moving around well, using her Rolator when she is tired. Able to walk independently with steady gait. Daughter Nena Jordan went back to work today. Son who lives in [place] will come by and check on patient later today. Patient denies any needs for community services. She is independent in all ADLS and IALDs. She will to drive to all appointments once released by surgeon. Family will take her to all appointments until she is able to drive herself.  Patient admits to self neglect as it relates to her DM. Last A1C 7.9 on 05/08/18. She has not checked her blood sugar since her abdominal surgery on 05/11/18. States she was not consistently checking them before. RN CM verified patient had a working CBG machine. Takes all medications as prescribed. Acknowledges  poor appetite since surgery related to "not having a taste for foods". She hopes this will benefit her as "sweets are my weakness". Patient states cakes were brought to her home during her recovery however she only consumed 2 "thin slices of cake" instead of what she would normally consume. Commended patient for her success however encouraged her to continue to eat healthy small meals including protein as this was needed for her body to heal post surgery. Patient is committed to lowering her A1C, decreasing the amount of simple carbs she consumes, and more frequent monitoring of her blood glucose.   Plan: Follow up home visit in 1 month   [facility] CM Care Plan Problem One     Most Recent Value  Care Plan Problem One  DM-self care  Role Documenting the Problem One  Care Management [place] for Problem One  Active  [facility] Long Term Goal   Patients A1C will decrease by 1 point within the next 90 days  [facility] Long Term Goal Start Date  06/06/18  Interventions for Problem One Long Term Goal  reviewed living well with DM booklet with patient, encouraged patient to check CBG as ordered BID, gave patient [facility] calendar to record CBG, EMMI handout  reviewing how DM effects whole body, reviewed proper diet choices/exchanges for DM healthy living  [facility] CM Short Term Goal #1   patient will consistantly check CBG twice daily and record within the next 30 days  [facility] CM Short Term Goal #1 Start Date  06/06/18  Interventions for Short Term Goal #1  reviewed importance of monitoring CBG, have patient [facility] calendar and showed patient how to correctly document  [facility] CM Short Term Goal #2   patient will review EMMI handout within the next 30 days  [facility] CM Short Term Goal #2 Start Date  06/06/18  Interventions for Short Term Goal #2  gave patient EMMI education and discussed importance or reviewing  [facility] CM Short Term Goal #3  patient will verbalize target fasting CBG range as establishd by ADA within the next 30 days  [facility] CM Short Term Goal #3 Start Date  06/06/18  Interventions for Short Tern Goal #3  RN CM filled out target fasting CBG range as established by the ADA in patients "Living well with Diabetes Booklet" and requested patient committ it to memory in order for her to know abnormals.     Lynnel Zanetti E. Rollene Rotunda RN, BSN [facility] Care Management Coordinator [phone]

## 2018-06-08 ENCOUNTER — Other Ambulatory Visit: Payer: Self-pay | Admitting: Family Medicine

## 2018-06-08 ENCOUNTER — Encounter (HOSPITAL_COMMUNITY): Payer: Self-pay

## 2018-06-08 ENCOUNTER — Other Ambulatory Visit: Payer: Self-pay | Admitting: Pharmacy Technician

## 2018-06-08 DIAGNOSIS — IMO0002 Reserved for concepts with insufficient information to code with codable children: Secondary | ICD-10-CM

## 2018-06-08 DIAGNOSIS — E1165 Type 2 diabetes mellitus with hyperglycemia: Principal | ICD-10-CM

## 2018-06-08 DIAGNOSIS — E1122 Type 2 diabetes mellitus with diabetic chronic kidney disease: Secondary | ICD-10-CM

## 2018-06-08 NOTE — Patient Outreach (Signed)
[facility]) Care Management  06/08/2018  KRIYA WESTRA 1946-02-16 [id number]   Successful outreach call to [name], HIPAA identifiers verified. Ms. Manzella confirmed that she has received the [place] patient assistance application and stated that she "hasn't felt like filling them out", she stated that she would fill them out. When asked if she had any questions about the applications, she stated she would call if she did.  Will follow up with patient in 94-49 days if application has not been returned.  [name] [place], [place] Management [phone]

## 2018-06-20 ENCOUNTER — Other Ambulatory Visit: Payer: Self-pay | Admitting: Pharmacy Technician

## 2018-06-20 ENCOUNTER — Encounter: Payer: Self-pay | Admitting: Gastroenterology

## 2018-06-20 ENCOUNTER — Ambulatory Visit (INDEPENDENT_AMBULATORY_CARE_PROVIDER_SITE_OTHER): Payer: PPO | Admitting: Gastroenterology

## 2018-06-20 VITALS — BP 104/66 | HR 80 | Ht 64.0 in | Wt 226.8 lb

## 2018-06-20 DIAGNOSIS — C183 Malignant neoplasm of hepatic flexure: Secondary | ICD-10-CM | POA: Diagnosis not present

## 2018-06-20 NOTE — Patient Outreach (Signed)
[facility]) Care Management  06/21/2018  TINEKA URIEGAS [birth year] [id number]   Received patient portion of [place] patient assistance application for Anora Ellipta. Refaxed provider requesting a script for medication.  Will follow up with provider in 2-3 business days is script not received.  [name] [place], [place] Management [phone]

## 2018-06-20 NOTE — Progress Notes (Signed)
[name] MD, MRCP(U.K) [address]  [place]  [place], [place] [address]  Main: [phone]  Fax: [phone]   Primary Care Physician: Steele Sizer, MD  Primary Gastroenterologist:  Dr. [name]   Chief Complaint  Patient presents with  . Follow up ulcerated polyps    HPI: Cindy Griffin is a 72 y.o. female   Summary of history :  Recently performed a colonoscopy on 03/13/18 for surveillance due to prior history of colon polyps. On the colonoscopy I noted diminutive polyps in the transverse colon , descending colon and cecum that I resected . The pathology returned as a tubular adenoma  I also did note a 20 mm polypoid lesion was found at the hepatic flexure. The lesion was polypoid and ulcerated. No bleeding was present. This was biopsied with a cold forceps for histology. Area was successfully injected with 20 mL Spot (carbon black) for tattooing.The polyp appeared very hard and firm . The appearance was suspicious for a high grade polyp . Pathology confirmed  LEAST HIGH GRADE DYSPLASIA ARISING IN A TUBULAR ADENOMA WITH ULCERATION.   Interval history   03/21/2018-  06/20/2018  She underwent Rt hemicolectomy on 05/11/18 : pathology showed INVASIVE ADENOCARCINOMA, WELL-DIFFERENTIATED, SPANNING 1.1 CM. TUMOR INVADES INTO SUPERFICIAL SUBMUCOSA. RESECTION MARGINS ARE NEGATIVE. TWENTY-ONE OF TWENTY-ONE LYMPH NODES NEGATIVE FOR CARCINOMA (0/21).  Post op course complicated by ileus. CT chest/abdomen showed no metastasis.  Doing well no issues- she has informed her kids to get a colonoscopy   Current Outpatient Medications  Medication Sig Dispense Refill  . Ascorbic Acid (VITAMIN C) 1000 MG tablet Take 1,000 mg by mouth daily.    [name] aspirin EC 81 MG tablet Take 1 tablet (81 mg total) by mouth daily. 90 tablet 1  . atorvastatin (LIPITOR) 80 MG tablet Take 1 tablet (80 mg total) by mouth daily. (Patient taking differently: Take 80 mg by mouth at bedtime. ) 90 tablet 1    . Carboxymethylcellul-Glycerin (LUBRICATING EYE DROPS OP) Place 2 drops into both eyes 2 (two) times daily as needed (for dry eyes).    . celecoxib (CELEBREX) 200 MG capsule TAKE 1 CAPSULE BY MOUTH ONCE DAILY 90 capsule 1  . cetirizine (ZYRTEC) 10 MG tablet Take 10 mg by mouth daily as needed for allergies.     . Cholecalciferol (VITAMIN D-3) 1000 units CAPS Take 1,000 Units by mouth daily.     . ferrous sulfate 325 (65 FE) MG EC tablet Take 325 mg by mouth daily.     [name] glipiZIDE (GLUCOTROL XL) 10 MG 24 hr tablet TAKE 1 TABLET BY MOUTH ONCE DAILY WITH BREAKFAST 90 tablet 0  . hydrochlorothiazide (HYDRODIURIL) 25 MG tablet Take 1 tablet (25 mg total) by mouth daily. 90 tablet 1  . lisinopril (PRINIVIL,ZESTRIL) 40 MG tablet Take 1 tablet (40 mg total) by mouth daily. (Patient taking differently: Take 40 mg by mouth at bedtime. ) 90 tablet 1  . metFORMIN (GLUCOPHAGE-XR) 750 MG 24 hr tablet Take 1 tablet (750 mg total) by mouth daily with breakfast. 180 tablet 1  . metoCLOPramide (REGLAN) 10 MG tablet Take 1 tablet (10 mg total) by mouth 4 (four) times daily -  before meals and at bedtime. 90 tablet 1  . Multiple Vitamins-Minerals (MULTIVITAMIN PO) Take 1 tablet by mouth daily.    . Omega-3 Fatty Acids (OMEGA-3 CF PO) Take 660 mg by mouth daily. Over the counter product    . ondansetron (ZOFRAN-ODT) 4 MG disintegrating tablet DISSOLVE  1 TABLET IN MOUTH EVERY 8 HOURS AS NEEDED FOR NAUSEA AND VOMITING  0  . pioglitazone (ACTOS) 15 MG tablet Take 1 tablet (15 mg total) by mouth daily. 90 tablet 1  . ranitidine (ZANTAC) 150 MG tablet TAKE 1 TABLET BY MOUTH TWICE DAILY. REPLACES OMEPRAZOLE. 180 tablet 1  . Semaglutide (OZEMPIC) 1 MG/DOSE SOPN Inject 0.5-1 mg into the skin once a week. (Patient taking differently: Inject 0.5 mg into the skin every Monday. ) 9 mL 2  . umeclidinium-vilanterol (ANORO ELLIPTA) 62.5-25 MCG/INH AEPB Inhale 1 puff into the lungs daily. 60 each 5  . acetaminophen (TYLENOL) 500 MG  tablet Take 1,000 mg by mouth every 6 (six) hours as needed for moderate pain or headache.     . albuterol (PROVENTIL HFA;VENTOLIN HFA) 108 (90 Base) MCG/ACT inhaler INHALE 2 PUFFS INTO LUNGS EVERY 6 HOURS AS NEEDED FOR WHEEZING OR SHORTNESS OF BREATH (Patient not taking: Reported on 06/06/2018) 9 each 0  . Blood Glucose Monitoring Suppl (ACCU-CHEK NANO SMARTVIEW) W/DEVICE KIT 1 Device by Does not apply route 3 (three) times daily. (Patient not taking: Reported on 06/06/2018) 1 kit 0  . fluticasone (FLONASE) 50 MCG/ACT nasal spray Place 2 sprays into both nostrils daily. (Patient not taking: Reported on 06/06/2018) 16 g 6  . glucose blood (ADVOCATE REDI-CODE) test strip 1 each by Other route QID. (Patient not taking: Reported on 06/06/2018) 100 each 12   No current facility-administered medications for this visit.     Allergies as of 06/20/2018  . (No Known Allergies)    ROS:  General: Negative for anorexia, weight loss, fever, chills, fatigue, weakness. ENT: Negative for hoarseness, difficulty swallowing , nasal congestion. CV: Negative for chest pain, angina, palpitations, dyspnea on exertion, peripheral edema.  Respiratory: Negative for dyspnea at rest, dyspnea on exertion, cough, sputum, wheezing.  GI: See history of present illness. GU:  Negative for dysuria, hematuria, urinary incontinence, urinary frequency, nocturnal urination.  Endo: Negative for unusual weight change.    Physical Examination:   BP 104/66   Pulse 80   Ht 5' 4"  (1.626 m)   Wt 226 lb 12.8 oz (102.9 kg)   BMI 38.93 kg/m   General: Well-nourished, well-developed in no acute distress.  Eyes: No icterus. Conjunctivae pink. Mouth: Oropharyngeal mucosa moist and pink , no lesions erythema or exudate. Lungs: Clear to auscultation bilaterally. Non-labored. Heart: Regular rate and rhythm, no murmurs rubs or gallops.  Abdomen: Bowel sounds are normal, nontender, nondistended, no hepatosplenomegaly or masses, no abdominal  bruits or hernia , no rebound or guarding.   Extremities: No lower extremity edema. No clubbing or deformities. Neuro: Alert and oriented x 3.  Grossly intact. Skin: Warm and dry, no jaundice.   Psych: Alert and cooperative, normal mood and affect.   Imaging Studies: Ct Chest W Contrast  Result Date: 05/25/2018 CLINICAL DATA:  Chest pain, shortness of breath. EXAM: CT CHEST, ABDOMEN, AND PELVIS WITH CONTRAST TECHNIQUE: Multidetector CT imaging of the chest, abdomen and pelvis was performed following the standard protocol during bolus administration of intravenous contrast. CONTRAST:  12m ISOVUE-300 IOPAMIDOL (ISOVUE-300) INJECTION 61% COMPARISON:  CT scan of May 20, 2018. FINDINGS: CT CHEST FINDINGS Cardiovascular: No significant vascular findings. Normal heart size. No pericardial effusion. Mediastinum/Nodes: No enlarged mediastinal, hilar, or axillary lymph nodes. Thyroid gland, trachea, and esophagus demonstrate no significant findings. Lungs/Pleura: No pneumothorax or pleural effusion is noted. Minimal bilateral posterior basilar subsegmental atelectasis is noted. Musculoskeletal: No chest wall mass or suspicious bone lesions  identified. CT ABDOMEN PELVIS FINDINGS Hepatobiliary: No focal liver abnormality is seen. No gallstones, gallbladder wall thickening, or biliary dilatation. Pancreas: Unremarkable. No pancreatic ductal dilatation or surrounding inflammatory changes. Spleen: Normal in size without focal abnormality. Adrenals/Urinary Tract: Adrenal glands appear normal. Stable small right renal cysts are noted. No hydronephrosis or renal obstruction is noted. No renal or ureteral calculi are noted. Urinary bladder is decompressed. Stomach/Bowel: The stomach appears normal. Status post right colectomy. Mild distal small bowel dilatation is noted concerning for ileus or distal small bowel obstruction. This is mildly improved compared to prior exam. Vascular/Lymphatic: No significant vascular  findings are present. No enlarged abdominal or pelvic lymph nodes. Reproductive: Status post hysterectomy. No adnexal masses. Other: Small amount of free fluid is noted in the pelvis. No hernia is noted. Musculoskeletal: No acute or significant osseous findings. IMPRESSION: No significant abnormality seen in the chest. Status post right colectomy. Mild distal small bowel dilatation is noted which may represent ileus or partial distal small bowel obstruction. This is mildly improved compared to prior exam. Small amount of ascites is noted in the pelvis. Electronically Signed   By: Marijo Conception, M.D.   On: 05/25/2018 12:15   Ct Abdomen Pelvis W Contrast  Result Date: 05/25/2018 CLINICAL DATA:  Chest pain, shortness of breath. EXAM: CT CHEST, ABDOMEN, AND PELVIS WITH CONTRAST TECHNIQUE: Multidetector CT imaging of the chest, abdomen and pelvis was performed following the standard protocol during bolus administration of intravenous contrast. CONTRAST:  121m ISOVUE-300 IOPAMIDOL (ISOVUE-300) INJECTION 61% COMPARISON:  CT scan of May 20, 2018. FINDINGS: CT CHEST FINDINGS Cardiovascular: No significant vascular findings. Normal heart size. No pericardial effusion. Mediastinum/Nodes: No enlarged mediastinal, hilar, or axillary lymph nodes. Thyroid gland, trachea, and esophagus demonstrate no significant findings. Lungs/Pleura: No pneumothorax or pleural effusion is noted. Minimal bilateral posterior basilar subsegmental atelectasis is noted. Musculoskeletal: No chest wall mass or suspicious bone lesions identified. CT ABDOMEN PELVIS FINDINGS Hepatobiliary: No focal liver abnormality is seen. No gallstones, gallbladder wall thickening, or biliary dilatation. Pancreas: Unremarkable. No pancreatic ductal dilatation or surrounding inflammatory changes. Spleen: Normal in size without focal abnormality. Adrenals/Urinary Tract: Adrenal glands appear normal. Stable small right renal cysts are noted. No hydronephrosis or  renal obstruction is noted. No renal or ureteral calculi are noted. Urinary bladder is decompressed. Stomach/Bowel: The stomach appears normal. Status post right colectomy. Mild distal small bowel dilatation is noted concerning for ileus or distal small bowel obstruction. This is mildly improved compared to prior exam. Vascular/Lymphatic: No significant vascular findings are present. No enlarged abdominal or pelvic lymph nodes. Reproductive: Status post hysterectomy. No adnexal masses. Other: Small amount of free fluid is noted in the pelvis. No hernia is noted. Musculoskeletal: No acute or significant osseous findings. IMPRESSION: No significant abnormality seen in the chest. Status post right colectomy. Mild distal small bowel dilatation is noted which may represent ileus or partial distal small bowel obstruction. This is mildly improved compared to prior exam. Small amount of ascites is noted in the pelvis. Electronically Signed   By: JMarijo Conception M.D.   On: 05/25/2018 12:15   Dg Abd Portable 1v  Result Date: 05/23/2018 CLINICAL DATA:  Laparoscopic right hemicolectomy. At the time discharge, patient was passing gas and having bowel movements. However 2-3 days later she started having bloating and decreased flatus. EXAM: PORTABLE ABDOMEN - 1 VIEW COMPARISON:  None. FINDINGS: The tip of the gastric tube projects over the right lateral hemiabdomen, in the expected location of the  proximal duodenum. Dilated small bowel loops persist compatible with persistent small bowel ileus or an early SBO, possibly partial given gas and stool in remaining left colon. IMPRESSION: Tip of gastric tube is seen in the right hemiabdomen likely in the proximal duodenum. Persistent gas-filled dilatation of small bowel as before query small-bowel ileus versus partial or early SBO. Electronically Signed   By: Ashley Royalty M.D.   On: 05/23/2018 21:32   Dg Abd Portable 1v  Result Date: 05/22/2018 CLINICAL DATA:  Initial evaluation  for NG tube placement EXAM: PORTABLE ABDOMEN - 1 VIEW COMPARISON:  Prior radiograph from 05/21/2018 FINDINGS: Enteric tube in place with tip in side hole positioned well beyond the GE junction. Side hole likely in the distal stomach, tip overlies the proximal duodenum. Multiple dilated gas-filled loops of bowel seen within the mid and left abdomen, suggesting ongoing obstruction. Scattered patchy left basilar opacity with suspected small effusion. IMPRESSION: Enteric tube in place with tip overlying the proximal duodenum, side hole in the distal stomach. Electronically Signed   By: Jeannine Boga M.D.   On: 05/22/2018 02:20   [place] Ekg Site Rite  Result Date: 05/22/2018 If Site Rite image not attached, placement could not be confirmed due to current cardiac rhythm.   Assessment and Plan:   Cindy Griffin is a 72 y.o. y/o female here to follow up large ulcerated polyp seen on index colonoscopy , referred for surgery and found to have invasive adenocarcinoma, node negative, margins clear.  T1N0.   Plan  1. Repeat colonoscopy in 10/2018 for surveillance  3. She is a [employer] witness 4. Advised to have children >40 years screened for colon cancer.    I have discussed alternative options, risks & benefits,  which include, but are not limited to, bleeding, infection, perforation,respiratory complication & drug reaction.  The patient agrees with this plan & written consent will be obtained.    Dr [name]  MD,MRCP [facility]) Follow up in 8 months

## 2018-06-26 ENCOUNTER — Telehealth: Payer: Self-pay | Admitting: Family Medicine

## 2018-06-26 DIAGNOSIS — J411 Mucopurulent chronic bronchitis: Secondary | ICD-10-CM

## 2018-06-26 MED ORDER — UMECLIDINIUM-VILANTEROL 62.5-25 MCG/INH IN AEPB: 1.0000 | INHALATION_SPRAY | Freq: Every day | RESPIRATORY_TRACT | 5 refills | Status: DC

## 2018-06-26 NOTE — Telephone Encounter (Signed)
Refill request was sent to Dr. Krichna Sowles for approval and submission.  

## 2018-06-26 NOTE — Telephone Encounter (Signed)
Can you ask if 90 days or 30 days?  [facility] will need to [facility]. I printed for 30 days supply with 5 refills.

## 2018-06-26 NOTE — Telephone Encounter (Signed)
Copied from [place] [id number]. Topic: General - Other >> Jun 26, 2018 12:21 PM Yvette Rack wrote: Reason for CRM: Bradford from [facility] has sent in two faxes for Cindy Griffin she sent it on 05-23-18 and 06-20-18 she would like a RX sent to her for pt at [facility]) [phone]

## 2018-06-27 ENCOUNTER — Other Ambulatory Visit: Payer: Self-pay | Admitting: Pharmacy Technician

## 2018-06-27 NOTE — Patient Outreach (Signed)
[place] [facility]) Care Management  06/27/2018  Cindy Griffin May 02, 1946 [id number]   Received provider portion of [place] patient assistance application for Anora Ellipta. Faxed completed application and required documents into [employer].   Will follow up with company in 2-3 business days to check status of application.  [name], [name] Management [phone]

## 2018-06-27 NOTE — Telephone Encounter (Signed)
rx has been faxed as requested

## 2018-06-29 ENCOUNTER — Other Ambulatory Visit: Payer: Self-pay | Admitting: Pharmacy Technician

## 2018-06-29 NOTE — Patient Outreach (Signed)
[place] [facility]) Care Management  06/29/2018  Cindy Griffin 19-Sep-1946 [id number]   Follow up call to [place] to check status of application for Anora Ellipta. Devedie stated that patient has been denied to not have spent the $600 out of pocket requirement. Patient needs to spend $68.26.  Successful outreach to patient, HIPAA identifiers verified. Informed patient of the requirement to be approved for program. She stated that she has some refills that are coming close to being due. I requested that she contact me after she has picked the medications up.  Will follow up with patient in 2-3 weeks if she has not contacted me.  [name] [place], [place] Management [phone]

## 2018-07-11 ENCOUNTER — Ambulatory Visit: Payer: PPO

## 2018-07-12 ENCOUNTER — Other Ambulatory Visit: Payer: Self-pay

## 2018-07-12 NOTE — Patient Outreach (Signed)
[facility] [facility]) Care Management   07/12/2018  Cindy Griffin 12-Jul-1946 [id number]  Cindy Griffin is an 72 y.o. female  Subjective: Patient states she is doing very well. She denies complications from recent abdominal surgery/illius. She states she is driving independently. Has resumed fellowship and church involvement. She has joined a gym and exercising 30-45 minutes 3 times a week. This includes walking on the treadmill and using the NuStep. States she is monitoring her diet and eating "healthier". Patient denies need for community resources however she is requesting financial assistance with her Ozempic.  Objective:   BP 120/64 (BP Location: Left Arm, Patient Position: Sitting, Cuff Size: Large)   Pulse 78   Resp 17   SpO2 97%   Physical Exam  Constitutional: She is oriented to person, place, and time. She appears well-developed and well-nourished.  Cardiovascular: Normal rate, regular rhythm, normal heart sounds and intact distal pulses.  Respiratory: Effort normal and breath sounds normal.  GI: Soft. Bowel sounds are normal.  Surgical incision approximated and healed  Musculoskeletal:  Decreased ROM to knees bilat  Neurological: She is alert and oriented to person, place, and time. She has normal reflexes.  Skin: Skin is warm and dry.  Psychiatric: She has a normal mood and affect. Her behavior is normal. Judgment and thought content normal.    Encounter Medications:   Outpatient Encounter Medications as of 07/12/2018  Medication Sig  . acetaminophen (TYLENOL) 500 MG tablet Take 1,000 mg by mouth every 6 (six) hours as needed for moderate pain or headache.   . albuterol (PROVENTIL HFA;VENTOLIN HFA) 108 (90 Base) MCG/ACT inhaler INHALE 2 PUFFS INTO LUNGS EVERY 6 HOURS AS NEEDED FOR WHEEZING OR SHORTNESS OF BREATH (Patient not taking: Reported on 06/06/2018)  . Ascorbic Acid (VITAMIN C) 1000 MG tablet Take 1,000 mg by mouth daily.  [name] aspirin EC 81 MG tablet Take 1  tablet (81 mg total) by mouth daily.  [name] atorvastatin (LIPITOR) 80 MG tablet Take 1 tablet (80 mg total) by mouth daily. (Patient taking differently: Take 80 mg by mouth at bedtime. )  . Blood Glucose Monitoring Suppl (ACCU-CHEK NANO SMARTVIEW) W/DEVICE KIT 1 Device by Does not apply route 3 (three) times daily. (Patient not taking: Reported on 06/06/2018)  . Carboxymethylcellul-Glycerin (LUBRICATING EYE DROPS OP) Place 2 drops into both eyes 2 (two) times daily as needed (for dry eyes).  . celecoxib (CELEBREX) 200 MG capsule TAKE 1 CAPSULE BY MOUTH ONCE DAILY  . cetirizine (ZYRTEC) 10 MG tablet Take 10 mg by mouth daily as needed for allergies.   . Cholecalciferol (VITAMIN D-3) 1000 units CAPS Take 1,000 Units by mouth daily.   . ferrous sulfate 325 (65 FE) MG EC tablet Take 325 mg by mouth daily.   . fluticasone (FLONASE) 50 MCG/ACT nasal spray Place 2 sprays into both nostrils daily. (Patient not taking: Reported on 06/06/2018)  . glipiZIDE (GLUCOTROL XL) 10 MG 24 hr tablet TAKE 1 TABLET BY MOUTH ONCE DAILY WITH BREAKFAST  . glucose blood (ADVOCATE REDI-CODE) test strip 1 each by Other route QID. (Patient not taking: Reported on 06/06/2018)  . hydrochlorothiazide (HYDRODIURIL) 25 MG tablet Take 1 tablet (25 mg total) by mouth daily.  [name] lisinopril (PRINIVIL,ZESTRIL) 40 MG tablet Take 1 tablet (40 mg total) by mouth daily. (Patient taking differently: Take 40 mg by mouth at bedtime. )  . metFORMIN (GLUCOPHAGE-XR) 750 MG 24 hr tablet Take 1 tablet (750 mg total) by mouth daily with breakfast.  .  metoCLOPramide (REGLAN) 10 MG tablet Take 1 tablet (10 mg total) by mouth 4 (four) times daily -  before meals and at bedtime.  . Multiple Vitamins-Minerals (MULTIVITAMIN PO) Take 1 tablet by mouth daily.  . Omega-3 Fatty Acids (OMEGA-3 CF PO) Take 660 mg by mouth daily. Over the counter product  . ondansetron (ZOFRAN-ODT) 4 MG disintegrating tablet DISSOLVE 1 TABLET IN MOUTH EVERY 8 HOURS AS NEEDED FOR NAUSEA AND  VOMITING  . pioglitazone (ACTOS) 15 MG tablet Take 1 tablet (15 mg total) by mouth daily.  . ranitidine (ZANTAC) 150 MG tablet TAKE 1 TABLET BY MOUTH TWICE DAILY. REPLACES OMEPRAZOLE.  . Semaglutide (OZEMPIC) 1 MG/DOSE SOPN Inject 0.5-1 mg into the skin once a week. (Patient taking differently: Inject 0.5 mg into the skin every Monday. )  . umeclidinium-vilanterol (ANORO ELLIPTA) 62.5-25 MCG/INH AEPB Inhale 1 puff into the lungs daily.   No facility-administered encounter medications on file as of 07/12/2018.     Functional Status:   In your present state of health, do you have any difficulty performing the following activities: 06/06/2018 05/20/2018  Hearing? N N  Comment - -  Vision? N N  Comment - -  Difficulty concentrating or making decisions? N N  Walking or climbing stairs? N N  Comment - -  Dressing or bathing? N N  Doing errands, shopping? N N  Preparing Food and eating ? N -  Comment - -  Using the Toilet? N -  In the past six months, have you accidently leaked urine? N -  Do you have problems with loss of bowel control? N -  Managing your Medications? N -  Managing your Finances? N -  Housekeeping or managing your Housekeeping? N -  Some recent data might be hidden    Fall/Depression Screening:    Fall Risk  06/06/2018 03/29/2018 02/24/2018  Falls in the past year? No No No  Number falls in past yr: - - -  Injury with Fall? - - -  Comment - - -  Risk for fall due to : - - Impaired balance/gait;Impaired vision  Risk for fall due to: Comment - - knee pain; arthritis; wears eyeglasses  Follow up - - -   PHQ 2/9 Scores 06/06/2018 02/24/2018 03/14/2017 11/11/2016 07/15/2016 03/15/2016 11/21/2015  PHQ - 2 Score 2 1 0 0 0 0 0  PHQ- 9 Score 5 4 - - - - -    Assessment: Patient out on her front porch cleaning her glass door when RN CM arrived. Pleasant affect. Ambulating without Rolator. Steady gait noted. Consitantly checking fasting am CBG over the last 30 days and recording  however she did not check this am related to rushing around preparing for RN CM visit. Range 80-159. Checks and records HS CBG sporadically. Range 85-185. Patient took it upon herself to also intermittently document meal log. RN CM able to use log to correlate high fasting am sugars with high pm carb intake. Patient appreciative of the education. RN CM encouraged patient to be more consistent with checking PM blood sugars. Patient states she will take log with her to her PCP appointment on September 3.  RN CM spoke with Joetta Manners, [facility] regarding patients request for assistance with Ozempic. [name] has been working with patient however patient has 1000.00 out of pocket before assistance can be received. RN CM left message for patient to return call to discuss conversation with [facility] as RN CM has already left home.  Plan: In  home follow up visit in 1 month. Will inform patient of conversation with Joetta Manners, [facility]  [facility] CM Care Plan Problem One     Most Recent Value  Care Plan Problem One  DM-self care  Role Documenting the Problem One  Care Management [place] for Problem One  Active  [facility]N Long Term Goal   Patients A1C will decrease by 1 point within the next 90 days  THN Long Term Goal Start Date  06/06/18  Interventions for Problem One Long Term Goal  reinforced diabetic education discussed at initial encounter, discussed how additional evening carbohydrated affect morning CBG, discussed medication adherence  [facility] CM Short Term Goal #1   patient will consistantly check CBG twice daily and record within the next 30 days  [facility] CM Short Term Goal #1 Start Date  07/12/18 [date recet]  Interventions for Short Term Goal #1  reinforced importance of checking HS blood sugar and recording daily.  TH[facility] CM Short Term Goal #2   patient will review EMMI handout within the next 30 days  THN CM Short Term Goal #2 Start Date  06/06/18  [facility] CM Short Term Goal #2 Met Date  07/12/18   THN CM Short Term Goal #3  patient will verbalize target fasting CBG range as establishd by ADA within the next 30 days  THN CM Short Term Goal #3 Start Date  06/06/18  [facility] CM Short Term Goal #3 Met Date  07/12/18     Joshual Terrio E. Rollene Rotunda RN, BSN [facility] Care Management Coordinator [phone]

## 2018-07-13 ENCOUNTER — Other Ambulatory Visit: Payer: Self-pay

## 2018-07-13 NOTE — Patient Outreach (Signed)
[place] [facility]) Care Management  07/13/2018  Cindy Griffin [birth year] [id number]  Care Coordination: Successful telephone encounter returning patients call as requested in VM message last evening. RN CM discussed with patient information received from Joetta Manners, [facility] regarding Ozempic. Reinforced to patient that Ozempic requires a 1000.00 OOP to qualify for their assistance program. Patient has only spent approximately 600.00 as of date. RN CM suggested patient continue to request samples from PCP or discuss other options if samples not available. Patient verbalized understanding. Patient states she has 2 more doses in hand. Next PCP visit September 3.  Plan: Will discuss solution/resolution with patient at next monthly encounter.  Jonel Sick E. Rollene Rotunda RN, BSN [facility] Care Management Coordinator [phone]

## 2018-07-17 ENCOUNTER — Other Ambulatory Visit: Payer: Self-pay | Admitting: Pharmacy Technician

## 2018-07-17 NOTE — Patient Outreach (Signed)
[place] [facility]) Care Management  07/17/2018  Cindy Griffin 06/10/1946 [id number]   Follow up call to [name] to check application status for Arnuity Ellipta. [name] stated that patient has been approved as of 07/17/18 until 11/28/18. She also stated that patient should receive medication in the next 14 business days.  Will follow up with patient in 10-14 business days to confirm medication has been received.  [name] [place], [place] Management [phone]

## 2018-07-19 ENCOUNTER — Other Ambulatory Visit: Payer: Self-pay | Admitting: Family Medicine

## 2018-07-19 ENCOUNTER — Other Ambulatory Visit: Payer: Self-pay | Admitting: Nurse Practitioner

## 2018-07-19 DIAGNOSIS — J411 Mucopurulent chronic bronchitis: Secondary | ICD-10-CM

## 2018-07-19 DIAGNOSIS — E785 Hyperlipidemia, unspecified: Secondary | ICD-10-CM

## 2018-07-20 ENCOUNTER — Other Ambulatory Visit: Payer: Self-pay | Admitting: Family Medicine

## 2018-07-20 DIAGNOSIS — Z1231 Encounter for screening mammogram for malignant neoplasm of breast: Secondary | ICD-10-CM

## 2018-07-20 NOTE — Telephone Encounter (Signed)
Refill Request for Cholesterol medication. Atorvastatin   Last visit: 03/29/2018   Lab Results  Component Value Date   CHOL 118 07/15/2017   HDL 56 07/15/2017   LDLCALC 37 07/15/2017   TRIG 68 05/29/2018   CHOLHDL 2.1 07/15/2017    Follow up on 08/01/2018

## 2018-07-27 ENCOUNTER — Other Ambulatory Visit: Payer: Self-pay | Admitting: Pharmacy Technician

## 2018-07-27 NOTE — Patient Outreach (Signed)
[place] [facility]) Care Management  07/27/2018  Cindy Griffin 1946-06-07 [id number]   Unsuccessful outreach call to patient, HIPAA appropriate voicemail left.   Will reach out to patient in 2-3 business days if call is not returned.  [name] [place], [place] Management [phone]

## 2018-08-01 ENCOUNTER — Encounter: Payer: Self-pay | Admitting: Family Medicine

## 2018-08-01 ENCOUNTER — Ambulatory Visit (INDEPENDENT_AMBULATORY_CARE_PROVIDER_SITE_OTHER): Payer: PPO | Admitting: Family Medicine

## 2018-08-01 VITALS — BP 118/60 | HR 81 | Temp 98.3°F | Ht 64.0 in | Wt 227.3 lb

## 2018-08-01 DIAGNOSIS — E1165 Type 2 diabetes mellitus with hyperglycemia: Secondary | ICD-10-CM | POA: Diagnosis not present

## 2018-08-01 DIAGNOSIS — N182 Chronic kidney disease, stage 2 (mild): Secondary | ICD-10-CM

## 2018-08-01 DIAGNOSIS — I10 Essential (primary) hypertension: Secondary | ICD-10-CM

## 2018-08-01 DIAGNOSIS — E785 Hyperlipidemia, unspecified: Secondary | ICD-10-CM

## 2018-08-01 DIAGNOSIS — F330 Major depressive disorder, recurrent, mild: Secondary | ICD-10-CM | POA: Insufficient documentation

## 2018-08-01 DIAGNOSIS — R42 Dizziness and giddiness: Secondary | ICD-10-CM | POA: Diagnosis not present

## 2018-08-01 DIAGNOSIS — B001 Herpesviral vesicular dermatitis: Secondary | ICD-10-CM | POA: Diagnosis not present

## 2018-08-01 DIAGNOSIS — E1122 Type 2 diabetes mellitus with diabetic chronic kidney disease: Secondary | ICD-10-CM | POA: Diagnosis not present

## 2018-08-01 DIAGNOSIS — IMO0002 Reserved for concepts with insufficient information to code with codable children: Secondary | ICD-10-CM

## 2018-08-01 DIAGNOSIS — Z23 Encounter for immunization: Secondary | ICD-10-CM

## 2018-08-01 MED ORDER — MECLIZINE HCL 25 MG PO TABS: 25.0000 mg | ORAL_TABLET | Freq: Four times a day (QID) | ORAL | 0 refills | Status: DC

## 2018-08-01 MED ORDER — VALACYCLOVIR HCL 1 G PO TABS: 1000.0000 mg | ORAL_TABLET | Freq: Two times a day (BID) | ORAL | 0 refills | Status: DC

## 2018-08-01 NOTE — Progress Notes (Signed)
Name: ARIENNA BENEGAS   MRN: [id number]    DOB: 07-06-1946   Date:08/01/2018       Progress Note  Subjective  Chief Complaint  Chief Complaint  Patient presents with  . Medication Refill  . Diabetes  . Hypertension  . Hyperlipidemia  . Insomnia  . Gastroesophageal Reflux  . Obesity    HPI  DMII with nephropathy: she is cannot afford brand medications. We tried Synjardi and Victoza but had to stop because of cost. She is currently on Glipizide XL34m and Metformin 1500 mg daily, Actos 15 mg, and glucose is trending up. We changed to OLeggettin Dec 2018 she is tolerating medication well since..HbgA1C from 8.1% to 8.5% ,9.0%down to 8.0% and during recent hospital stay it was 7.9% . She had parenteral nutrition for about 2 weeks. She states since discharge she is worried about cost of medication, does not think she can afford Ozempic. Glucose at home fasting is in the 130's most of the time occasionally 160. Post-prandially 86-187, but usually 140's  She brought her sugar log. She denies polyphagia, polydipsia or polyuria. She is snacking on glucerna shakes before bed.   HTN: taking medication. No chest pain or palpitation, EKG is normal today, bp is towards low end of normal. She has noticed some dizziness when getting up quickly. Discussed cutting down on diuretic since she is down on lisinopril to 40 mg daily    Hyperlipidemia: taking Atorvastatin and denies side effects of medications.   Cervical and lumbar radiculitis/OA: seeing Dr. CPhyllis Ginger had a Left L4-5 transforaminal epidural injection, she also had PT, and on 07/01/2017 she had a left C3-4 transforaminal epidural steroid injection under fluoroscopic guidance. Had left shoulder surgery, tingling on her hands has improved, and neck pain is stable also. She also has post-traumatic left knee OA , she wants to stop on Celebrex because of cost    Insomnia: she has difficulty falling and staying asleep, she has not been taking  trazodone but not sleeping well again and she will try to resume medication   Morbid Obesity: she tried Contrave but made her vomit. We started her on Belvig in April 2017 and she lost weight, she was down 22 lbs by June when she had shoulder surgery, but she stopped exercising and taking medication and hadgained 12lbs post-surgery, but stopped Belviq because of cost. She has been on Ozempic, lost some weight during recent admission for partial colectomy followed by complications and ileus Weight is down to 227 lbs  Chronic Bronchitis: she has a chronic productive cough no recent episodes of flares. Still has SOB with activity and also has wheezing.   GERD: symptoms are under control with Omeprazole but discussed potential long term risk, she is now on Ranitidine 150 mg twice daily and off Omeprazolesince Summer 2018  Left breast rash: she states noticed a itchy rash 2 days ago, using topical otc topical antibiotics. Advised hydrocortisone and if no improvement return for follow up  Vertigo: she states about one month ago noticed spinning sensation when she moves her head in bed or when sitting, not daily, it seems to be improving. Not associated with tinnitus or hearing loss. She wants meclizine, advised referral to ENT but she refused.    Patient Active Problem List   Diagnosis Date Noted  . Mild episode of recurrent major depressive disorder ([place] 08/01/2018  . SBO (small bowel obstruction) and ileocolonic anastomosis from recent colectomy 05/22/2018  . Cancer of ascending colon - pT1N0 (0/21)  s/p lap right colectomy 05/11/2018 05/21/2018  . Hypomagnesemia 05/21/2018  . Ileus following gastrointestinal surgery ([place]) 05/20/2018  . Essential hemorrhagic thrombocythemia ([place]) 03/29/2018  . Hypercalcemia 11/23/2015  . DDD (degenerative disc disease), lumbar 07/28/2015  . Spinal stenosis at L4-L5 level 06/12/2015  . Benign essential HTN 05/25/2015  . Bunion 05/25/2015  . Cardiac  enlargement 05/25/2015  . Cervical radicular pain 05/25/2015  . Back pain, chronic 05/25/2015  . Osteoarthritis 05/25/2015  . Dyslipidemia 05/25/2015  . Gastric reflux 05/25/2015  . Insomnia 05/25/2015  . Eczema intertrigo 05/25/2015  . Bronchitis, chronic, mucopurulent ([place]) 05/25/2015  . Numerous moles 05/25/2015  . Morbid obesity ([place]) 05/25/2015  . Perennial allergic rhinitis 05/25/2015  . DM (diabetes mellitus), type 2, uncontrolled, with renal complications ([place]) 09/38/1829  . Snores 05/25/2015  . History of artificial joint 05/25/2015  . Degeneration of intervertebral disc of cervical region 02/06/2015  . Neuritis or radiculitis due to rupture of lumbar intervertebral disc 01/14/2015  . Abnormal presence of protein in urine 05/04/2010  . Vitamin D deficiency 05/04/2010    Past Surgical History:  Procedure Laterality Date  . ABDOMINAL HYSTERECTOMY     due to cancer-partial  . BREAST BIOPSY Right    Benign  . COLONOSCOPY N/A 04/11/2015   Procedure: COLONOSCOPY;  Surgeon: Hulen Luster, MD;  Location: [facility] ENDOSCOPY;  Service: Gastroenterology;  Laterality: N/A;  . COLONOSCOPY WITH PROPOFOL N/A 03/13/2018   Procedure: COLONOSCOPY WITH PROPOFOL;  Surgeon: [name], MD;  Location: [facility] ENDOSCOPY;  Service: Gastroenterology;  Laterality: N/A;  . JOINT REPLACEMENT     left knee x3,  right knee 2005  . LAPAROSCOPIC RIGHT COLECTOMY Right 05/11/2018   Procedure: LAPAROSCOPIC RIGHT HEMICOLECTOMY ERAS PATHWAY;  Surgeon: Ileana Roup, MD;  Location: [facility] ORS;  Service: General;  Laterality: Right;  . SHOULDER ARTHROSCOPY WITH OPEN ROTATOR CUFF REPAIR Left 05/18/2016   Procedure: SHOULDER ARTHROSCOPY WITH OPEN ROTATOR CUFF REPAIR,distal clavicle excision, decompression;  Surgeon: Corky Mull, MD;  Location: [facility] ORS;  Service: Orthopedics;  Laterality: Left;  . SPINAL FUSION  1994   C-Spine  . Transforaminal Epidural  02/20/2015   Injection into cervical spine- C5-6 Dr. Phyllis Ginger     Family History  Problem Relation Age of Onset  . Diabetes Mother   . Kidney disease Mother   . Hypertension Mother   . Healthy Father   . Asthma Daughter   . Diabetes Sister   . Kidney disease Sister   . Diabetes Brother     Social History   Socioeconomic History  . Marital status: Widowed    Spouse name: Jeneen Rinks  . Number of children: 4  . Years of education: some college  . Highest education level: 12th grade  Occupational History  . Occupation: Retired  Scientific laboratory technician  . Financial resource strain: Not hard at all  . Food insecurity:    Worry: Never true    Inability: Never true  . Transportation needs:    Medical: No    Non-medical: No  Tobacco Use  . Smoking status: Never Smoker  . Smokeless tobacco: Never Used  . Tobacco comment: smoking cessation materials not required  Substance and Sexual Activity  . Alcohol use: Not Currently    Alcohol/week: 0.0 standard drinks  . Drug use: No  . Sexual activity: Not Currently    Birth control/protection: Surgical  Lifestyle  . Physical activity:    Days per week: 3 days    Minutes per session: 30 min  .  Stress: Only a little  Relationships  . Social connections:    Talks on phone: More than three times a week    Gets together: More than three times a week    Attends religious service: More than 4 times per year    Active member of club or organization: Yes    Attends meetings of clubs or organizations: More than 4 times per year    Relationship status: Widowed  . Intimate partner violence:    Fear of current or ex partner: No    Emotionally abused: No    Physically abused: No    Forced sexual activity: No  Other Topics Concern  . Not on file  Social History Narrative  . Not on file     Current Outpatient Medications:  .  acetaminophen (TYLENOL) 500 MG tablet, Take 1,000 mg by mouth every 6 (six) hours as needed for moderate pain or headache. , Disp: , Rfl:  .  Ascorbic Acid (VITAMIN C) 1000 MG tablet,  Take 1,000 mg by mouth daily., Disp: , Rfl:  .  aspirin EC 81 MG tablet, Take 1 tablet (81 mg total) by mouth daily., Disp: 90 tablet, Rfl: 1 .  atorvastatin (LIPITOR) 80 MG tablet, TAKE 1 TABLET BY MOUTH ONCE DAILY, Disp: 90 tablet, Rfl: 1 .  Carboxymethylcellul-Glycerin (LUBRICATING EYE DROPS OP), Place 2 drops into both eyes 2 (two) times daily as needed (for dry eyes)., Disp: , Rfl:  .  cetirizine (ZYRTEC) 10 MG tablet, Take 10 mg by mouth daily as needed for allergies. , Disp: , Rfl:  .  Cholecalciferol (VITAMIN D-3) 1000 units CAPS, Take 1,000 Units by mouth daily. , Disp: , Rfl:  .  ferrous sulfate 325 (65 FE) MG EC tablet, Take 325 mg by mouth daily. , Disp: , Rfl:  .  fluticasone (FLONASE) 50 MCG/ACT nasal spray, Place 2 sprays into both nostrils daily., Disp: 16 g, Rfl: 6 .  glipiZIDE (GLUCOTROL XL) 10 MG 24 hr tablet, TAKE 1 TABLET BY MOUTH ONCE DAILY WITH BREAKFAST, Disp: 90 tablet, Rfl: 0 .  hydrochlorothiazide (HYDRODIURIL) 25 MG tablet, Take 1 tablet (25 mg total) by mouth daily., Disp: 90 tablet, Rfl: 1 .  lisinopril (PRINIVIL,ZESTRIL) 40 MG tablet, Take 1 tablet (40 mg total) by mouth daily. (Patient taking differently: Take 40 mg by mouth at bedtime. ), Disp: 90 tablet, Rfl: 1 .  metFORMIN (GLUCOPHAGE-XR) 750 MG 24 hr tablet, Take 1 tablet (750 mg total) by mouth daily with breakfast., Disp: 180 tablet, Rfl: 1 .  metoCLOPramide (REGLAN) 10 MG tablet, Take 1 tablet (10 mg total) by mouth 4 (four) times daily -  before meals and at bedtime., Disp: 90 tablet, Rfl: 1 .  Multiple Vitamins-Minerals (MULTIVITAMIN PO), Take 1 tablet by mouth daily., Disp: , Rfl:  .  Omega-3 Fatty Acids (OMEGA-3 CF PO), Take 660 mg by mouth daily. Over the counter product, Disp: , Rfl:  .  ondansetron (ZOFRAN-ODT) 4 MG disintegrating tablet, DISSOLVE 1 TABLET IN MOUTH EVERY 8 HOURS AS NEEDED FOR NAUSEA AND VOMITING, Disp: , Rfl: 0 .  pioglitazone (ACTOS) 15 MG tablet, Take 1 tablet (15 mg total) by mouth  daily., Disp: 90 tablet, Rfl: 1 .  PROAIR HFA 108 (90 Base) MCG/ACT inhaler, INHALE 2 PUFFS INTO LUNGS EVERY 6 HOURS AS NEEDED FOR WHEEZING OR SHORTNESS OF BREATH, Disp: 9 each, Rfl: 0 .  ranitidine (ZANTAC) 150 MG tablet, TAKE 1 TABLET BY MOUTH TWICE DAILY. REPLACES OMEPRAZOLE., Disp: 180 tablet,  Rfl: 1 .  Semaglutide (OZEMPIC) 1 MG/DOSE SOPN, Inject 0.5-1 mg into the skin once a week. (Patient taking differently: Inject 0.5 mg into the skin every Monday. ), Disp: 9 mL, Rfl: 2 .  umeclidinium-vilanterol (ANORO ELLIPTA) 62.5-25 MCG/INH AEPB, Inhale 1 puff into the lungs daily., Disp: 60 each, Rfl: 5 .  Blood Glucose Monitoring Suppl (ACCU-CHEK NANO SMARTVIEW) W/DEVICE KIT, 1 Device by Does not apply route 3 (three) times daily. (Patient not taking: Reported on 06/06/2018), Disp: 1 kit, Rfl: 0 .  glucose blood (ADVOCATE REDI-CODE) test strip, 1 each by Other route QID. (Patient not taking: Reported on 06/06/2018), Disp: 100 each, Rfl: 12  No Known Allergies   ROS  Constitutional: Negative for fever , positive weight change.  Respiratory: Positive for cough and shortness of breath.   Cardiovascular: Negative for chest pain or palpitations.  Gastrointestinal: Negative for abdominal pain, no bowel changes.  Musculoskeletal: Negative for gait problem or joint swelling.  Skin: Positive  for rash on lips and also left breast.  Neurological: Positive for dizziness but no  headache.  No other specific complaints in a complete review of systems (except as listed in HPI above).  Objective  Vitals:   08/01/18 1024  BP: 118/60  Pulse: 81  Temp: 98.3 F (36.8 C)  TempSrc: Oral  SpO2: 98%  Weight: 227 lb 4.8 oz (103.1 kg)  Height: 5' 4"  (1.626 m)    Body mass index is 39.02 kg/m.  Physical Exam  Constitutional: Patient appears well-developed and well-nourished. Obese No distress.  HEENT: head atraumatic, normocephalic, pupils equal and reactive to light, ears normal TM bilaterally, neck  supple, throat within normal limits Cardiovascular: Normal rate, regular rhythm and normal heart sounds.  No murmur heard. No BLE edema. Pulmonary/Chest: Effort normal and breath sounds normal. No respiratory distress. Abdominal: Soft.  There is no tenderness. Psychiatric: Patient has a normal mood and affect. behavior is normal. Judgment and thought content normal. Neurological: normal cranial nerves, no nystagmus    PHQ2/9: Depression screen [facility] 2/9 08/01/2018 06/06/2018 02/24/2018 03/14/2017 11/11/2016  Decreased Interest 0 1 0 0 0  Down, Depressed, Hopeless 0 1 1 0 0  PHQ - 2 Score 0 2 1 0 0  Altered sleeping 3 1 1  - -  Tired, decreased energy 3 1 1  - -  Change in appetite 3 1 1  - -  Feeling bad or failure about yourself  0 0 0 - -  Trouble concentrating 1 0 0 - -  Moving slowly or fidgety/restless 0 0 0 - -  Suicidal thoughts 0 0 0 - -  PHQ-9 Score 10 5 4  - -  Difficult doing work/chores Somewhat difficult Not difficult at all Not difficult at all - -   Getting worse but refuses medication   Fall Risk: Fall Risk  08/01/2018 06/06/2018 03/29/2018 02/24/2018 11/14/2017  Falls in the past year? No No No No No  Number falls in past yr: - - - - -  Injury with Fall? - - - - -  Welcome for fall due to : - - - Impaired balance/gait;Impaired vision -  Risk for fall due to: Comment - - - knee pain; arthritis; wears eyeglasses -  Follow up - - - - -     Functional Status Survey: Is the patient deaf or have difficulty hearing?: No Does the patient have difficulty seeing, even when wearing glasses/contacts?: No Does the patient have difficulty concentrating, remembering,  or making decisions?: Yes Does the patient have difficulty walking or climbing stairs?: Yes Does the patient have difficulty dressing or bathing?: No Does the patient have difficulty doing errands alone such as visiting a doctor's office or shopping?: No    Assessment & Plan  1. Uncontrolled type 2  diabetes mellitus with stage 2 chronic kidney disease, without long-term current use of insulin (HCC)  - Urine Microalbumin w/creat. ratio  2. Need for immunization against influenza  - Flu vaccine HIGH DOSE PF (Fluzone High dose)  3. Mild episode of recurrent major depressive disorder (HCC)  Refuses medication, but states she has some trazodone that she will resume for sleep   4. Vertigo  - meclizine (ANTIVERT) 25 MG tablet; Take 1 tablet (25 mg total) by mouth every 6 (six) hours.  Dispense: 20 tablet; Refill: 0  5. Morbid obesity due to excess calories [facility])  Discussed with the patient the risk posed by an increased BMI. Discussed importance of portion control, calorie counting and at least 150 minutes of physical activity weekly. Avoid sweet beverages and drink more water. Eat at least 6 servings of fruit and vegetables daily   6. Benign essential HTN   7. Dyslipidemia   8. Fever blister  - valACYclovir (VALTREX) 1000 MG tablet; Take 1 tablet (1,000 mg total) by mouth 2 (two) times daily.  Dispense: 20 tablet; Refill: 0

## 2018-08-01 NOTE — Patient Instructions (Signed)
Ask about Trulicity

## 2018-08-02 LAB — MICROALBUMIN / CREATININE URINE RATIO
Creatinine, Urine: 146 mg/dL (ref 20–275)
Microalb Creat Ratio: 3 mcg/mg creat (ref <30)
Microalb, Ur: 0.4 mg/dL

## 2018-08-07 ENCOUNTER — Other Ambulatory Visit: Payer: Self-pay

## 2018-08-07 NOTE — Patient Outreach (Signed)
[place] [facility]) Care Management  08/07/2018  Cindy Griffin 25-May-1946 [id number]  Successful outreach attempt to [name].  HIPAA identifiers verified.   Medication Assistance: Per last note on 07/17/18 from [place], [name] should have received her Anoro Ellipta patient assistance from [place].  [name] states that she has not received this medication.    Plan: Route note to CPhT, Etter Sjogren to see if she can call [place] and track this medication.  Joetta Manners, PharmD Clinical Pharmacist [place] [phone]

## 2018-08-08 ENCOUNTER — Other Ambulatory Visit: Payer: Self-pay

## 2018-08-08 NOTE — Patient Outreach (Signed)
[name]) Care Management   08/08/2018  Cindy Griffin 1946/05/25 [id number]  Cindy Griffin is an 72 y.o. female  Subjective:  Patient states she continues to do very well. States she has "completely healed" from her recent abdominal surgery/illius. She acknowledges having a BM daily and denies N/V. She is back to her independent lifesytle. Her children check on her frequently. She continues to exercise at a local gy 2-3 times weekly. She has to stop using the NuStep related to it causing additional knee pain so now she just walks on the treadmill for 30 min or greater. States she continues to monitor her diet and eating "healthier" although to admits to having a hard time "giving up my bread". She had a 3 month follow up with Dr. Joeseph Amor last week and took her [facility] calendar with her to show PCP her blood glucose recordings. Patient states Dr. Ancil Boozer gave patient "OK" to only check CBG daily. PCP did not check A1C however it will be checked in December.  Objective:   BP 110/64 (BP Location: Left Arm, Patient Position: Sitting, Cuff Size: Normal)   Pulse 76   Resp 19   Wt 227 lb (103 kg) Comment: per patient at recent Md visit  SpO2 97%   BMI 38.96 kg/m   Physical Exam  Constitutional: She is oriented to person, place, and time. She appears well-developed and well-nourished.  Cardiovascular: Normal rate, regular rhythm, normal heart sounds and intact distal pulses.  Respiratory: Effort normal and breath sounds normal.  GI: Soft. Bowel sounds are normal.  Musculoskeletal:  Patient admits to decreased ROM in R knee  Neurological: She is alert and oriented to person, place, and time.  Skin: Skin is warm and dry.  Psychiatric: She has a normal mood and affect. Her behavior is normal. Judgment and thought content normal.    Encounter Medications:   Outpatient Encounter Medications as of 08/08/2018  Medication Sig  . acetaminophen (TYLENOL) 500 MG tablet Take 1,000 mg by  mouth every 6 (six) hours as needed for moderate pain or headache.   . Ascorbic Acid (VITAMIN C) 1000 MG tablet Take 1,000 mg by mouth daily.  [name] aspirin EC 81 MG tablet Take 1 tablet (81 mg total) by mouth daily.  [name] atorvastatin (LIPITOR) 80 MG tablet TAKE 1 TABLET BY MOUTH ONCE DAILY  . Blood Glucose Monitoring Suppl (ACCU-CHEK NANO SMARTVIEW) W/DEVICE KIT 1 Device by Does not apply route 3 (three) times daily. (Patient not taking: Reported on 06/06/2018)  . Carboxymethylcellul-Glycerin (LUBRICATING EYE DROPS OP) Place 2 drops into both eyes 2 (two) times daily as needed (for dry eyes).  . cetirizine (ZYRTEC) 10 MG tablet Take 10 mg by mouth daily as needed for allergies.   . Cholecalciferol (VITAMIN D-3) 1000 units CAPS Take 1,000 Units by mouth daily.   . ferrous sulfate 325 (65 FE) MG EC tablet Take 325 mg by mouth daily.   . fluticasone (FLONASE) 50 MCG/ACT nasal spray Place 2 sprays into both nostrils daily.  [name] glipiZIDE (GLUCOTROL XL) 10 MG 24 hr tablet TAKE 1 TABLET BY MOUTH ONCE DAILY WITH BREAKFAST  . glucose blood (ADVOCATE REDI-CODE) test strip 1 each by Other route QID. (Patient not taking: Reported on 06/06/2018)  . hydrochlorothiazide (HYDRODIURIL) 25 MG tablet Take 1 tablet (25 mg total) by mouth daily.  [name] lisinopril (PRINIVIL,ZESTRIL) 40 MG tablet Take 1 tablet (40 mg total) by mouth daily. (Patient taking differently: Take 40 mg by mouth at bedtime. )  .  meclizine (ANTIVERT) 25 MG tablet Take 1 tablet (25 mg total) by mouth every 6 (six) hours.  . metFORMIN (GLUCOPHAGE-XR) 750 MG 24 hr tablet Take 1 tablet (750 mg total) by mouth daily with breakfast.  . metoCLOPramide (REGLAN) 10 MG tablet Take 1 tablet (10 mg total) by mouth 4 (four) times daily -  before meals and at bedtime.  . Multiple Vitamins-Minerals (MULTIVITAMIN PO) Take 1 tablet by mouth daily.  . Omega-3 Fatty Acids (OMEGA-3 CF PO) Take 660 mg by mouth daily. Over the counter product  . ondansetron (ZOFRAN-ODT) 4 MG  disintegrating tablet DISSOLVE 1 TABLET IN MOUTH EVERY 8 HOURS AS NEEDED FOR NAUSEA AND VOMITING  . pioglitazone (ACTOS) 15 MG tablet Take 1 tablet (15 mg total) by mouth daily.  [name] PROAIR HFA 108 (90 Base) MCG/ACT inhaler INHALE 2 PUFFS INTO LUNGS EVERY 6 HOURS AS NEEDED FOR WHEEZING OR SHORTNESS OF BREATH  . ranitidine (ZANTAC) 150 MG tablet TAKE 1 TABLET BY MOUTH TWICE DAILY. REPLACES OMEPRAZOLE.  . Semaglutide (OZEMPIC) 1 MG/DOSE SOPN Inject 0.5-1 mg into the skin once a week. (Patient taking differently: Inject 0.5 mg into the skin every Monday. )  . umeclidinium-vilanterol (ANORO ELLIPTA) 62.5-25 MCG/INH AEPB Inhale 1 puff into the lungs daily.  . valACYclovir (VALTREX) 1000 MG tablet Take 1 tablet (1,000 mg total) by mouth 2 (two) times daily.   No facility-administered encounter medications on file as of 08/08/2018.     Functional Status:   In your present state of health, do you have any difficulty performing the following activities: 08/01/2018 06/06/2018  Hearing? N N  Vision? N N  Difficulty concentrating or making decisions? Y N  Walking or climbing stairs? Y N  Comment - -  Dressing or bathing? N N  Doing errands, shopping? N N  Preparing Food and eating ? - N  Using the Toilet? - N  In the past six months, have you accidently leaked urine? - N  Do you have problems with loss of bowel control? - N  Managing your Medications? - N  Managing your Finances? - N  Housekeeping or managing your Housekeeping? - N  Some recent data might be hidden    Fall/Depression Screening:    Fall Risk  08/01/2018 06/06/2018 03/29/2018  Falls in the past year? No No No  Number falls in past yr: - - -  Injury with Fall? - - -  Comment - - -  Risk for fall due to : - - -  Risk for fall due to: Comment - - -  Follow up - - -   PHQ 2/9 Scores 08/01/2018 06/06/2018 02/24/2018 03/14/2017 11/11/2016 07/15/2016 03/15/2016  PHQ - 2 Score 0 2 1 0 0 0 0  PHQ- 9 Score 10 5 4  - - - -    Assessment:  Patient  appears well. Well groomed, pleasant affect. Patient continued to check CBG Bid as instructed until PCP appointment 9/3 where she was told she only needed to check CBG daily. Reviewed DM education including carbohydrate moderation, medication adherence (patient will continue to get samples of ozempic from PCP), and exercise. Also reinforced review of "living well with DM" education booklet when needed". Patient verbalized understanding. RN CM commended patient for all her efforts in managing her DM. Discussed with patient transitioning to Chronic Disease Management Program related to short term goals being met and long term goal needing extension as A1C was not checked during most recent PCP visit. Patient gave verbal consent  to participate in Disease Management Program (health coach).  Plan: Per patient's request RN CM will transfer patients care to Health Coach for continued education related to patients DM self care. Will send discipline closure letter to PCP  [facility] CM Care Plan Problem One     Most Recent Value  Care Plan Problem One  DM-self care  Role Documenting the Problem One  Care Management [place] for Problem One  Active  [facility] Long Term Goal   Patients A1C will decrease by 1 point within the next 90 days  [facility] Long Term Goal Start Date  08/08/18 [date recet.patient will have A1C rechecked in December]  [facility] CM Short Term Goal #1   patient will consistantly check CBG twice daily and record within the next 30 days  [facility] CM Short Term Goal #1 Start Date  07/12/18 [date recet]  [facility] CM Short Term Goal #1 Met Date  08/08/18  [facility] CM Short Term Goal #2   patient will review EMMI handout within the next 30 days  [facility] CM Short Term Goal #2 Start Date  06/06/18  [facility] CM Short Term Goal #2 Met Date  07/12/18  [facility] CM Short Term Goal #3  patient will verbalize target fasting CBG range as establishd by ADA within the next 30 days  [facility] CM Short Term Goal #3 Start Date  06/06/18  [facility] CM Short  Term Goal #3 Met Date  07/12/18     Adaia Matthies E. Rollene Rotunda RN, BSN [facility] Care Management Coordinator [phone]

## 2018-08-11 ENCOUNTER — Encounter: Payer: Self-pay | Admitting: *Deleted

## 2018-08-11 ENCOUNTER — Other Ambulatory Visit: Payer: Self-pay | Admitting: Pharmacy Technician

## 2018-08-11 NOTE — Patient Outreach (Signed)
[facility]) Care Management  08/11/2018  Cindy Griffin [birth year] [id number]   Successful outreach call to patient, HIPAA identifiers confirmed. Informed patient that [place] misread her application and delivered her Cammie Sickle on 8/28 to [place]. Also informed her that an expedited shipment request was put in and medication should arrive by tomorrow.  Will follow up with patient in 2-3 business days to confirm medication has been received.  [name] [place], [place] Management [phone]

## 2018-08-15 ENCOUNTER — Other Ambulatory Visit: Payer: Self-pay | Admitting: Pharmacy Technician

## 2018-08-15 NOTE — Patient Outreach (Signed)
[facility]) Care Management  08/15/2018  Cindy Griffin 1946/03/14 [id number]   Successful outreach call to patient, she verified she received her Anora Ellipta from [place] yesterday. Reviewed with patient how to obtain refills when needed.  Will route note to [place] for case closure.  [name] [place], [place] Management [phone]

## 2018-08-16 ENCOUNTER — Other Ambulatory Visit: Payer: Self-pay

## 2018-08-16 NOTE — Patient Outreach (Addendum)
[facility]) Care Management  08/16/2018  Cindy Griffin 1946-03-19 [id number]  Patient has verified receipt of Anoro Ellipta patient assistance medication from [place].  Plan: Close [place] case.  Route case closure letter to PCP, Dr. Ancil Boozer.  Joetta Manners, PharmD Clinical Pharmacist [place] [phone]

## 2018-08-17 ENCOUNTER — Other Ambulatory Visit: Payer: Self-pay | Admitting: Family Medicine

## 2018-08-17 DIAGNOSIS — E1165 Type 2 diabetes mellitus with hyperglycemia: Principal | ICD-10-CM

## 2018-08-17 DIAGNOSIS — E1122 Type 2 diabetes mellitus with diabetic chronic kidney disease: Secondary | ICD-10-CM

## 2018-08-17 DIAGNOSIS — I10 Essential (primary) hypertension: Secondary | ICD-10-CM

## 2018-08-17 DIAGNOSIS — IMO0002 Reserved for concepts with insufficient information to code with codable children: Secondary | ICD-10-CM

## 2018-08-17 NOTE — Telephone Encounter (Signed)
Request for diabetes medication. Glipizide to [facility].   Last office visit pertaining to diabetes: 08/01/2018  Lab Results  Component Value Date   HGBA1C 7.9 (H) 05/08/2018   Hypertension medication request: Lisinopril   BP Readings from Last 3 Encounters:  08/08/18 110/64  08/01/18 118/60  07/12/18 120/64    Lab Results  Component Value Date   CREATININE 0.68 05/30/2018   BUN 15 05/30/2018   NA 142 05/30/2018   K 4.0 05/30/2018   CL 108 05/30/2018   CO2 26 05/30/2018    Follow up on 10/31/2018

## 2018-08-30 ENCOUNTER — Ambulatory Visit
Admission: RE | Admit: 2018-08-30 | Discharge: 2018-08-30 | Disposition: A | Discharge status: Home / Self Care | Payer: PPO | Source: Ambulatory Visit | Attending: Family Medicine | Admitting: Family Medicine

## 2018-08-30 DIAGNOSIS — Z1231 Encounter for screening mammogram for malignant neoplasm of breast: Secondary | ICD-10-CM | POA: Insufficient documentation

## 2018-09-04 DIAGNOSIS — M5416 Radiculopathy, lumbar region: Secondary | ICD-10-CM | POA: Diagnosis not present

## 2018-09-04 DIAGNOSIS — M5412 Radiculopathy, cervical region: Secondary | ICD-10-CM | POA: Diagnosis not present

## 2018-09-04 DIAGNOSIS — M48062 Spinal stenosis, lumbar region with neurogenic claudication: Secondary | ICD-10-CM | POA: Diagnosis not present

## 2018-09-04 DIAGNOSIS — M1612 Unilateral primary osteoarthritis, left hip: Secondary | ICD-10-CM | POA: Diagnosis not present

## 2018-09-04 DIAGNOSIS — M5030 Other cervical disc degeneration, unspecified cervical region: Secondary | ICD-10-CM | POA: Diagnosis not present

## 2018-09-04 DIAGNOSIS — M5136 Other intervertebral disc degeneration, lumbar region: Secondary | ICD-10-CM | POA: Diagnosis not present

## 2018-09-04 DIAGNOSIS — M4802 Spinal stenosis, cervical region: Secondary | ICD-10-CM | POA: Diagnosis not present

## 2018-09-05 DIAGNOSIS — Z471 Aftercare following joint replacement surgery: Secondary | ICD-10-CM | POA: Diagnosis not present

## 2018-09-05 DIAGNOSIS — Z96651 Presence of right artificial knee joint: Secondary | ICD-10-CM | POA: Diagnosis not present

## 2018-09-05 DIAGNOSIS — Z96653 Presence of artificial knee joint, bilateral: Secondary | ICD-10-CM | POA: Diagnosis not present

## 2018-09-05 DIAGNOSIS — Z96652 Presence of left artificial knee joint: Secondary | ICD-10-CM | POA: Diagnosis not present

## 2018-09-06 ENCOUNTER — Ambulatory Visit: Payer: Self-pay | Admitting: *Deleted

## 2018-09-15 ENCOUNTER — Ambulatory Visit: Payer: Self-pay | Admitting: *Deleted

## 2018-09-18 ENCOUNTER — Ambulatory Visit (INDEPENDENT_AMBULATORY_CARE_PROVIDER_SITE_OTHER): Payer: PPO

## 2018-09-18 DIAGNOSIS — Z23 Encounter for immunization: Secondary | ICD-10-CM | POA: Diagnosis not present

## 2018-09-20 ENCOUNTER — Other Ambulatory Visit: Payer: Self-pay | Admitting: Family Medicine

## 2018-09-20 DIAGNOSIS — IMO0002 Reserved for concepts with insufficient information to code with codable children: Secondary | ICD-10-CM

## 2018-09-20 DIAGNOSIS — I10 Essential (primary) hypertension: Secondary | ICD-10-CM

## 2018-09-20 DIAGNOSIS — E1165 Type 2 diabetes mellitus with hyperglycemia: Secondary | ICD-10-CM

## 2018-09-20 DIAGNOSIS — E1122 Type 2 diabetes mellitus with diabetic chronic kidney disease: Secondary | ICD-10-CM

## 2018-09-29 DIAGNOSIS — M5136 Other intervertebral disc degeneration, lumbar region: Secondary | ICD-10-CM | POA: Diagnosis not present

## 2018-09-29 DIAGNOSIS — M48062 Spinal stenosis, lumbar region with neurogenic claudication: Secondary | ICD-10-CM | POA: Diagnosis not present

## 2018-09-29 DIAGNOSIS — M5416 Radiculopathy, lumbar region: Secondary | ICD-10-CM | POA: Diagnosis not present

## 2018-10-12 ENCOUNTER — Other Ambulatory Visit: Payer: Self-pay

## 2018-10-12 DIAGNOSIS — C183 Malignant neoplasm of hepatic flexure: Secondary | ICD-10-CM

## 2018-10-12 MED ORDER — PEG 3350-KCL-NABCB-NACL-NASULF 236 G PO SOLR: ORAL | 0 refills | Status: DC

## 2018-10-19 ENCOUNTER — Other Ambulatory Visit: Payer: Self-pay | Admitting: Family Medicine

## 2018-10-19 DIAGNOSIS — J411 Mucopurulent chronic bronchitis: Secondary | ICD-10-CM

## 2018-10-20 ENCOUNTER — Ambulatory Visit: Payer: Self-pay

## 2018-10-20 DIAGNOSIS — E1165 Type 2 diabetes mellitus with hyperglycemia: Principal | ICD-10-CM

## 2018-10-20 DIAGNOSIS — E1122 Type 2 diabetes mellitus with diabetic chronic kidney disease: Secondary | ICD-10-CM

## 2018-10-20 DIAGNOSIS — I10 Essential (primary) hypertension: Secondary | ICD-10-CM

## 2018-10-20 DIAGNOSIS — M5416 Radiculopathy, lumbar region: Secondary | ICD-10-CM | POA: Diagnosis not present

## 2018-10-20 DIAGNOSIS — IMO0002 Reserved for concepts with insufficient information to code with codable children: Secondary | ICD-10-CM

## 2018-10-20 DIAGNOSIS — M5136 Other intervertebral disc degeneration, lumbar region: Secondary | ICD-10-CM | POA: Diagnosis not present

## 2018-10-20 DIAGNOSIS — N182 Chronic kidney disease, stage 2 (mild): Principal | ICD-10-CM

## 2018-10-20 DIAGNOSIS — M48062 Spinal stenosis, lumbar region with neurogenic claudication: Secondary | ICD-10-CM | POA: Diagnosis not present

## 2018-10-20 NOTE — Chronic Care Management (AMB) (Signed)
  Chronic Care Management   Telephone Outreach Note  10/20/2018 Name: Cindy Griffin MRN: [id number] DOB: 09-Apr-1946  Referred by: Health Plan Reason for referral : Chronic Care Management (introduction of services)    I reached out to [name] today by phone in response to a referral sent by Ms. Royetta Crochet [place]'s health plan.   Was unable to reach patient via telephone today for introduction of services provided by the CCM Team. I  have left HIPAA compliant voicemail asking patient to return my call. (unsuccessful outreach # 1).  Plan: Will follow-up within 3-5  business days via telephone.     Ciana Simmon E. Rollene Rotunda, RN, BSN Nurse Care Coordinator [facility] [place] Management  [phone]   Steele Sizer, MD has been notified of this outreach.

## 2018-10-25 ENCOUNTER — Ambulatory Visit: Payer: Self-pay

## 2018-10-25 NOTE — Chronic Care Management (AMB) (Signed)
  Chronic Care Management   Telephone Outreach Note  10/25/2018 Name: DORINNE GRAEFF MRN: [id number] DOB: 04-26-1946  Referred by: Health Plan Reason for referral : Chronic Care Management (introduction of services/returning call)    I reached out to [name] today by phone in response to a referral sent by Ms. Royetta Crochet [place]'s health plan. Information about CCM services and an offer to enroll [name] in the CCM program were provided. Ms. YASHIRA OFFENBERGER accepted and has been enrolled in the CCM program.  Plan: Ms. ARWYN BESAW will be seen in the office fact to face after her PCP appointment 10/31/18 and an initial face to face with the CCM Team will be scheduled at that time.  Steele Sizer, MD has been notified of this outreach and Ms. Royetta Crochet [place]'s decision and plan.     Issis Lindseth E. Rollene Rotunda, RN, BSN Nurse Care Coordinator [facility] Care Management  [phone]

## 2018-10-25 NOTE — Patient Instructions (Signed)
ccm 

## 2018-10-27 ENCOUNTER — Other Ambulatory Visit: Payer: Self-pay | Admitting: Family Medicine

## 2018-10-27 DIAGNOSIS — K219 Gastro-esophageal reflux disease without esophagitis: Secondary | ICD-10-CM

## 2018-10-31 ENCOUNTER — Ambulatory Visit: Payer: Self-pay

## 2018-10-31 ENCOUNTER — Encounter: Payer: Self-pay | Admitting: Family Medicine

## 2018-10-31 ENCOUNTER — Ambulatory Visit (INDEPENDENT_AMBULATORY_CARE_PROVIDER_SITE_OTHER): Payer: PPO | Admitting: Family Medicine

## 2018-10-31 VITALS — BP 136/72 | HR 90 | Temp 98.4°F | Resp 16 | Ht 64.0 in | Wt 232.9 lb

## 2018-10-31 DIAGNOSIS — E538 Deficiency of other specified B group vitamins: Secondary | ICD-10-CM | POA: Diagnosis not present

## 2018-10-31 DIAGNOSIS — E559 Vitamin D deficiency, unspecified: Secondary | ICD-10-CM

## 2018-10-31 DIAGNOSIS — Z79899 Other long term (current) drug therapy: Secondary | ICD-10-CM | POA: Diagnosis not present

## 2018-10-31 DIAGNOSIS — E1165 Type 2 diabetes mellitus with hyperglycemia: Secondary | ICD-10-CM

## 2018-10-31 DIAGNOSIS — E785 Hyperlipidemia, unspecified: Secondary | ICD-10-CM

## 2018-10-31 DIAGNOSIS — I10 Essential (primary) hypertension: Secondary | ICD-10-CM

## 2018-10-31 DIAGNOSIS — E1143 Type 2 diabetes mellitus with diabetic autonomic (poly)neuropathy: Secondary | ICD-10-CM

## 2018-10-31 DIAGNOSIS — D473 Essential (hemorrhagic) thrombocythemia: Secondary | ICD-10-CM

## 2018-10-31 DIAGNOSIS — F330 Major depressive disorder, recurrent, mild: Secondary | ICD-10-CM

## 2018-10-31 DIAGNOSIS — IMO0002 Reserved for concepts with insufficient information to code with codable children: Secondary | ICD-10-CM

## 2018-10-31 DIAGNOSIS — E1169 Type 2 diabetes mellitus with other specified complication: Secondary | ICD-10-CM

## 2018-10-31 DIAGNOSIS — K219 Gastro-esophageal reflux disease without esophagitis: Secondary | ICD-10-CM | POA: Diagnosis not present

## 2018-10-31 DIAGNOSIS — H6992 Unspecified Eustachian tube disorder, left ear: Secondary | ICD-10-CM

## 2018-10-31 DIAGNOSIS — M26622 Arthralgia of left temporomandibular joint: Secondary | ICD-10-CM

## 2018-10-31 DIAGNOSIS — J411 Mucopurulent chronic bronchitis: Secondary | ICD-10-CM | POA: Diagnosis not present

## 2018-10-31 DIAGNOSIS — E1129 Type 2 diabetes mellitus with other diabetic kidney complication: Secondary | ICD-10-CM

## 2018-10-31 DIAGNOSIS — Z85038 Personal history of other malignant neoplasm of large intestine: Secondary | ICD-10-CM

## 2018-10-31 DIAGNOSIS — R809 Proteinuria, unspecified: Secondary | ICD-10-CM

## 2018-10-31 LAB — POCT GLYCOSYLATED HEMOGLOBIN (HGB A1C): HbA1c, POC (controlled diabetic range): 7.3 % — AB (ref 0.0–7.0)

## 2018-10-31 MED ORDER — FAMOTIDINE 20 MG PO TABS: 20.0000 mg | ORAL_TABLET | Freq: Two times a day (BID) | ORAL | 1 refills | Status: DC

## 2018-10-31 MED ORDER — METOCLOPRAMIDE HCL 10 MG PO TABS: 10.0000 mg | ORAL_TABLET | Freq: Three times a day (TID) | ORAL | 1 refills | Status: DC

## 2018-10-31 NOTE — Chronic Care Management (AMB) (Signed)
  Chronic Care Management   Note  10/31/2018 Name: Cindy Griffin MRN: [id number] DOB: [birth year]  Cindy Griffin is a 72 y.o. year old female who sees Steele Sizer, MD for primary care. [name] healthcare plan asked the CCM team to consult the patient for assistance with chronic disease management. The CCM Team met with [name] face to face to schedule an initial intake. Ms, [name] has previously consented to CCM services during previous telephone encounter.    Plan: Will meet with patient on 11/16/18 at 1:00    Keaghan Staton E. Rollene Rotunda, RN, BSN Nurse Care Coordinator [facility] Care Management  [phone]

## 2018-10-31 NOTE — Progress Notes (Signed)
Name: Cindy Griffin   MRN: [id number]    DOB: September 28, 1946   Date:10/31/2018       Progress Note  Subjective  Chief Complaint  Chief Complaint  Patient presents with  . Medication Refill  . Diabetes    Checking sugar at home daily and states it has been up and down-Lowest-81  Average-127 Highest-217  . Hypertension    Denies any symptoms-only has dizziness but thinks it comes from her neck  . Hyperlipidemia  . Insomnia  . Gastroesophageal Reflux  . Obesity  . Ear Pressure    Onset-1 month, just in left ear would like to discuss symptoms    HPI   DMII with nephropathy: she is cannot afford brand medications. We tried Synjardi and Victoza but had to stop because of cost. She is currently on Glipizide XL36m and Metformin 1500 mg daily, Actos 15 mg, and glucose is trending up. We changed to [place] Dec 2018 she is tolerating medication well since..HbgA1C from 8.1% to 8.5%,9.0%down to 8.0%  7.9% and today it is down to 7.3%  She is now also on Ozempic   She denies polyphagia, polydipsia or polyuria. She also has gastroparesis and is now on reglan  HTN: taking medication. No chest pain or palpitation, bp is at goal today  History of colon cancer: very early recesction pT1N0( 0/21 lymphonod) , she will have repeat colonoscopy next year. He prescribed reglan and she wants a refill.   Hyperlipidemia: taking Atorvastatin and denies side effects of medications. We will recheck labs   Cervical and lumbar radiculitis/OA: seeing Dr. CPhyllis Ginger had a Left L4-5 transforaminal epidural injection, she also had PT, and on 07/01/2017 she had a left C3-4 transforaminal epidural steroid injection under fluoroscopic guidance. Had left shoulder surgery, tingling on her hands has improved, and neck painis stable also.She also has post-traumatic leftknee OA , she wants to stop on Celebrex because of cost She recently had lumbar spine epidural and it has helped with pain, pain is zero on her back  today   Insomnia: she has difficulty falling and staying asleep, she has not been taking trazodone because she does not want to depend on medication  Morbid Obesity: she tried Contrave but made her vomit. We started her on Belvig in April 2017 and she lost weight, she was down 22 lbs by June when she had shoulder surgery, but she stopped exercising and taking medication and hadgained 12lbs post-surgery, but stopped Belviq because of cost. tWeight was down to 227 lbs but is up again, states no longer using meal replacement, Ozempic helped with weight loss in the beginning but is gaining it back. She   Chronic Bronchitis: she has a chronic productivecough no recent episodes of flares that required steroid. Still has SOB with activity and also has mild wheezing.   GERD: symptoms are under control with Omeprazole but discussed potential long term risk, she is now on Ranitidine 150 mg twice daily, but because of recall we will change to pepcid.  She has been  off Omeprazolesince Summer 2018  Vertigo: resolved, but states over the past few weeks she has noticed left ear fullness, no fever or chills. She has not been using nasal steroid daily, she has noticed nasal congestion and some pressure on left maxillary sinus.   Major Depression:  She is still has symptoms but does not want to take medications for depression, or sleep.   Patient Active Problem List   Diagnosis Date Noted  . Mild  episode of recurrent major depressive disorder ([place]) 08/01/2018  . SBO (small bowel obstruction) and ileocolonic anastomosis from recent colectomy 05/22/2018  . Cancer of ascending colon - pT1N0 (0/21) s/p lap right colectomy 05/11/2018 05/21/2018  . Hypomagnesemia 05/21/2018  . Ileus following gastrointestinal surgery ([place]) 05/20/2018  . Essential hemorrhagic thrombocythemia ([place]) 03/29/2018  . Hypercalcemia 11/23/2015  . DDD (degenerative disc disease), lumbar 07/28/2015  . Spinal stenosis at L4-L5 level  06/12/2015  . Benign essential HTN 05/25/2015  . Bunion 05/25/2015  . Cardiac enlargement 05/25/2015  . Cervical radicular pain 05/25/2015  . Back pain, chronic 05/25/2015  . Osteoarthritis 05/25/2015  . Dyslipidemia 05/25/2015  . Gastric reflux 05/25/2015  . Insomnia 05/25/2015  . Eczema intertrigo 05/25/2015  . Bronchitis, chronic, mucopurulent ([place]) 05/25/2015  . Numerous moles 05/25/2015  . Morbid obesity ([place]) 05/25/2015  . Perennial allergic rhinitis 05/25/2015  . DM (diabetes mellitus), type 2, uncontrolled, with renal complications ([place]) 32/35/5732  . Snores 05/25/2015  . History of artificial joint 05/25/2015  . Degeneration of intervertebral disc of cervical region 02/06/2015  . Neuritis or radiculitis due to rupture of lumbar intervertebral disc 01/14/2015  . Abnormal presence of protein in urine 05/04/2010  . Vitamin D deficiency 05/04/2010    Past Surgical History:  Procedure Laterality Date  . ABDOMINAL HYSTERECTOMY     due to cancer-partial  . BREAST BIOPSY Right    Benign  . COLONOSCOPY N/A 04/11/2015   Procedure: COLONOSCOPY;  Surgeon: Hulen Luster, MD;  Location: [facility] ENDOSCOPY;  Service: Gastroenterology;  Laterality: N/A;  . COLONOSCOPY WITH PROPOFOL N/A 03/13/2018   Procedure: COLONOSCOPY WITH PROPOFOL;  Surgeon: [name], MD;  Location: [facility] ENDOSCOPY;  Service: Gastroenterology;  Laterality: N/A;  . JOINT REPLACEMENT     left knee x3,  right knee 2005  . LAPAROSCOPIC RIGHT COLECTOMY Right 05/11/2018   Procedure: LAPAROSCOPIC RIGHT HEMICOLECTOMY ERAS PATHWAY;  Surgeon: Ileana Roup, MD;  Location: [facility] ORS;  Service: General;  Laterality: Right;  . SHOULDER ARTHROSCOPY WITH OPEN ROTATOR CUFF REPAIR Left 05/18/2016   Procedure: SHOULDER ARTHROSCOPY WITH OPEN ROTATOR CUFF REPAIR,distal clavicle excision, decompression;  Surgeon: Corky Mull, MD;  Location: [facility] ORS;  Service: Orthopedics;  Laterality: Left;  . SPINAL FUSION  1994   C-Spine  .  Transforaminal Epidural  02/20/2015   Injection into cervical spine- C5-6 Dr. Phyllis Ginger    Family History  Problem Relation Age of Onset  . Diabetes Mother   . Kidney disease Mother   . Hypertension Mother   . Healthy Father   . Asthma Daughter   . Diabetes Sister   . Kidney disease Sister   . Diabetes Brother     Social History   Socioeconomic History  . Marital status: Widowed    Spouse name: Jeneen Rinks  . Number of children: 4  . Years of education: some college  . Highest education level: 12th grade  Occupational History  . Occupation: Retired  Scientific laboratory technician  . Financial resource strain: Not hard at all  . Food insecurity:    Worry: Never true    Inability: Never true  . Transportation needs:    Medical: No    Non-medical: No  Tobacco Use  . Smoking status: Never Smoker  . Smokeless tobacco: Never Used  . Tobacco comment: smoking cessation materials not required  Substance and Sexual Activity  . Alcohol use: Not Currently    Alcohol/week: 0.0 standard drinks  . Drug use: No  . Sexual activity: Not Currently  Birth control/protection: Surgical  Lifestyle  . Physical activity:    Days per week: 0 days    Minutes per session: 0 min  . Stress: Only a little  Relationships  . Social connections:    Talks on phone: More than three times a week    Gets together: More than three times a week    Attends religious service: More than 4 times per year    Active member of club or organization: Yes    Attends meetings of clubs or organizations: More than 4 times per year    Relationship status: Widowed  . Intimate partner violence:    Fear of current or ex partner: No    Emotionally abused: No    Physically abused: No    Forced sexual activity: No  Other Topics Concern  . Not on file  Social History Narrative  . Not on file     Current Outpatient Medications:  .  acetaminophen (TYLENOL) 500 MG tablet, Take 1,000 mg by mouth every 6 (six) hours as needed for  moderate pain or headache. , Disp: , Rfl:  .  Ascorbic Acid (VITAMIN C) 1000 MG tablet, Take 1,000 mg by mouth daily., Disp: , Rfl:  .  aspirin EC 81 MG tablet, Take 1 tablet (81 mg total) by mouth daily., Disp: 90 tablet, Rfl: 1 .  atorvastatin (LIPITOR) 80 MG tablet, TAKE 1 TABLET BY MOUTH ONCE DAILY, Disp: 90 tablet, Rfl: 1 .  Blood Glucose Monitoring Suppl (ACCU-CHEK NANO SMARTVIEW) W/DEVICE KIT, 1 Device by Does not apply route 3 (three) times daily., Disp: 1 kit, Rfl: 0 .  Carboxymethylcellul-Glycerin (LUBRICATING EYE DROPS OP), Place 2 drops into both eyes 2 (two) times daily as needed (for dry eyes)., Disp: , Rfl:  .  cetirizine (ZYRTEC) 10 MG tablet, Take 10 mg by mouth daily as needed for allergies. , Disp: , Rfl:  .  Cholecalciferol (VITAMIN D-3) 1000 units CAPS, Take 1,000 Units by mouth daily. , Disp: , Rfl:  .  ferrous sulfate 325 (65 FE) MG EC tablet, Take 325 mg by mouth daily. , Disp: , Rfl:  .  fluticasone (FLONASE) 50 MCG/ACT nasal spray, Place 2 sprays into both nostrils daily., Disp: 16 g, Rfl: 6 .  glipiZIDE (GLUCOTROL XL) 10 MG 24 hr tablet, TAKE 1 TABLET BY MOUTH ONCE DAILY WITH BREAKFAST, Disp: 90 tablet, Rfl: 0 .  glucose blood (ADVOCATE REDI-CODE) test strip, 1 each by Other route QID., Disp: 100 each, Rfl: 12 .  hydrochlorothiazide (HYDRODIURIL) 25 MG tablet, Take 1 tablet (25 mg total) by mouth daily., Disp: 90 tablet, Rfl: 1 .  lisinopril (PRINIVIL,ZESTRIL) 40 MG tablet, TAKE 1 TABLET BY MOUTH AT BEDTIME, Disp: 90 tablet, Rfl: 0 .  meclizine (ANTIVERT) 25 MG tablet, Take 1 tablet (25 mg total) by mouth every 6 (six) hours., Disp: 20 tablet, Rfl: 0 .  metFORMIN (GLUCOPHAGE-XR) 750 MG 24 hr tablet, Take 1 tablet (750 mg total) by mouth daily with breakfast., Disp: 180 tablet, Rfl: 1 .  metoCLOPramide (REGLAN) 10 MG tablet, Take 1 tablet (10 mg total) by mouth 4 (four) times daily -  before meals and at bedtime., Disp: 90 tablet, Rfl: 1 .  Multiple Vitamins-Minerals  (MULTIVITAMIN PO), Take 1 tablet by mouth daily., Disp: , Rfl:  .  Omega-3 Fatty Acids (OMEGA-3 CF PO), Take 660 mg by mouth daily. Over the counter product, Disp: , Rfl:  .  ondansetron (ZOFRAN-ODT) 4 MG disintegrating tablet, DISSOLVE 1 TABLET IN  MOUTH EVERY 8 HOURS AS NEEDED FOR NAUSEA AND VOMITING, Disp: , Rfl: 0 .  pioglitazone (ACTOS) 15 MG tablet, Take 1 tablet (15 mg total) by mouth daily., Disp: 90 tablet, Rfl: 1 .  PROAIR HFA 108 (90 Base) MCG/ACT inhaler, INHALE 2 PUFFS INTO LUNGS EVERY 6 HOURS AS NEEDED FOR WHEEZING OR SHORTNESS OF BREATH, Disp: 9 each, Rfl: 0 .  traMADol (ULTRAM) 50 MG tablet, TAKE ONE HALF TO ONE TABLET BY MOUTH TWICE DAILY AS NEEDED, Disp: , Rfl: 3 .  umeclidinium-vilanterol (ANORO ELLIPTA) 62.5-25 MCG/INH AEPB, Inhale 1 puff into the lungs daily., Disp: 60 each, Rfl: 5 .  valACYclovir (VALTREX) 1000 MG tablet, Take 1 tablet (1,000 mg total) by mouth 2 (two) times daily., Disp: 20 tablet, Rfl: 0 .  famotidine (PEPCID) 20 MG tablet, Take 1 tablet (20 mg total) by mouth 2 (two) times daily. In place of ranitidine, Disp: 180 tablet, Rfl: 1 .  polyethylene glycol (GOLYTELY) 236 g solution, Drink one 8 oz glass every 20 minutes until entire container is finished (Patient not taking: Reported on 10/31/2018), Disp: 4000 mL, Rfl: 0 .  Semaglutide (OZEMPIC) 1 MG/DOSE SOPN, Inject 0.5-1 mg into the skin once a week. (Patient taking differently: Inject 0.5 mg into the skin every Monday. ), Disp: 9 mL, Rfl: 2  No Known Allergies  I personally reviewed active problem list, medication list, allergies, family history with the patient/caregiver today.   ROS    Constitutional: Negative for fever , positive for mild weight change.  Respiratory: Positive for intermittent cough and shortness of breath.   Cardiovascular: Negative for chest pain or palpitations.  Gastrointestinal: Negative for abdominal pain, no bowel changes.  Musculoskeletal: positive for gait problem or joint  swelling.  Skin: Negative for rash.  Neurological: Negative for dizziness or headache.  No other specific complaints in a complete review of systems (except as listed in HPI above).  Objective  Vitals:   10/31/18 1104  BP: 136/72  Pulse: 90  Resp: 16  Temp: 98.4 F (36.9 C)  TempSrc: Oral  SpO2: 96%  Weight: 232 lb 14.4 oz (105.6 kg)  Height: 5' 4"  (1.626 m)    Body mass index is 39.98 kg/m.  Physical Exam  Constitutional: Patient appears well-developed and well-nourished. Obese  No distress.  HEENT: head atraumatic, normocephalic, pupils equal and reactive to light, neck supple, throat within normal limits Cardiovascular: Normal rate, regular rhythm and normal heart sounds.  No murmur heard. No BLE edema. Pulmonary/Chest: Effort normal and breath sounds normal. No respiratory distress. Abdominal: Soft.  There is no tenderness. Psychiatric: Patient has a normal mood and affect. behavior is normal. Judgment and thought content normal.  Recent Results (from the past 2160 hour(s))  POCT glycosylated hemoglobin (Hb A1C)     Status: Abnormal   Collection Time: 10/31/18 11:43 AM  Result Value Ref Range   Hemoglobin A1C     HbA1c POC (<> result, manual entry)     HbA1c, POC (prediabetic range)     HbA1c, POC (controlled diabetic range) 7.3 (A) 0.0 - 7.0 %    Diabetic Foot Exam: Diabetic Foot Exam - Simple   Simple Foot Form Diabetic Foot exam was performed with the following findings:  Yes 10/31/2018 11:42 AM  Visual Inspection See comments:  Yes Sensation Testing Intact to touch and monofilament testing bilaterally:  Yes Pulse Check Posterior Tibialis and Dorsalis pulse intact bilaterally:  Yes Comments Some corn formation on right 5th toe  PHQ2/9: Depression screen [facility] 2/9 10/31/2018 08/01/2018 06/06/2018 02/24/2018 03/14/2017  Decreased Interest 0 0 1 0 0  Down, Depressed, Hopeless 0 0 1 1 0  PHQ - 2 Score 0 0 2 1 0  Altered sleeping 3 3 1 1  -  Tired, decreased  energy 3 3 1 1  -  Change in appetite 3 3 1 1  -  Feeling bad or failure about yourself  0 0 0 0 -  Trouble concentrating 1 1 0 0 -  Moving slowly or fidgety/restless 0 0 0 0 -  Suicidal thoughts 0 0 0 0 -  PHQ-9 Score 10 10 5 4  -  Difficult doing work/chores Somewhat difficult Somewhat difficult Not difficult at all Not difficult at all -    Fall Risk: Fall Risk  10/31/2018 08/01/2018 06/06/2018 03/29/2018 02/24/2018  Falls in the past year? 0 No No No No  Number falls in past yr: 0 - - - -  Injury with Fall? 0 - - - -  Comment - - - - -  Risk for fall due to : - - - - Impaired balance/gait;Impaired vision  Risk for fall due to: Comment - - - - knee pain; arthritis; wears eyeglasses  Follow up - - - - -     Functional Status Survey: Is the patient deaf or have difficulty hearing?: No Does the patient have difficulty seeing, even when wearing glasses/contacts?: Yes Does the patient have difficulty concentrating, remembering, or making decisions?: No Does the patient have difficulty walking or climbing stairs?: No Does the patient have difficulty dressing or bathing?: No Does the patient have difficulty doing errands alone such as visiting a doctor's office or shopping?: No   Assessment & Plan  1. Mucopurulent chronic bronchitis (HCC)  Using inhaler daily and helps with symptoms  2. Benign essential HTN  At goal , continue medication   3. Morbid obesity due to excess calories ([place])  She gained weight since last visit, she will resume physical activity and she will resume shake as food replacement   4. Vitamin D deficiency  Continue supplementation   5. Eustachian tube disorder, left  Advised to use Flonase daily if not better refer to ENT  6. TMJ tenderness, left  Use topical medication on left TMJ  7. Dyslipidemia associated with type 2 diabetes mellitus (HCC)  - POCT glycosylated hemoglobin (Hb A1C) - Lipid panel  8. Long-term use of high-risk medication  Check  labs  9. Essential hemorrhagic thrombocythemia ([place])  Stable  10. Gastric reflux  - famotidine (PEPCID) 20 MG tablet; Take 1 tablet (20 mg total) by mouth 2 (two) times daily. In place of ranitidine  Dispense: 180 tablet; Refill: 1  11. Mild episode of recurrent major depressive disorder ([place])   12. B12 deficiency  - Vitamin B12  13. Controlled type 2 diabetes mellitus with microalbuminuria, without long-term current use of insulin (HCC)  - Urine Microalbumin w/creat. ratio

## 2018-10-31 NOTE — Patient Instructions (Signed)
1. Thank You for allowing the CCM (Chronic Care Management) Team to assist you with your healthcare goals!! We look forward to meeting you on 11/16/18 at 1:00pm 2. Contact the CCM Team if you have any question or need to reschedule your initial visit. 3. Please bring all your medications and glucometer with you to your visit.  CCM (Chronic Care Management) Team   Trish Fountain RN, BSN Nurse Care Coordinator  [phone]  [name] PharmD  Clinical Pharmacist  [phone]

## 2018-11-01 LAB — CBC WITH DIFFERENTIAL/PLATELET
Basophils Absolute: 53 cells/uL (ref 0–200)
Basophils Relative: 0.6 %
Eosinophils Absolute: 151 cells/uL (ref 15–500)
Eosinophils Relative: 1.7 %
HCT: 34.6 % — ABNORMAL LOW (ref 35.0–45.0)
Hemoglobin: 11.5 g/dL — ABNORMAL LOW (ref 11.7–15.5)
Lymphs Abs: 2270 cells/uL (ref 850–3900)
MCH: 27.3 pg (ref 27.0–33.0)
MCHC: 33.2 g/dL (ref 32.0–36.0)
MCV: 82.2 fL (ref 80.0–100.0)
MPV: 10.4 fL (ref 7.5–12.5)
Monocytes Relative: 7.7 %
Neutro Abs: 5741 cells/uL (ref 1500–7800)
Neutrophils Relative %: 64.5 %
Platelets: 348 10*3/uL (ref 140–400)
RBC: 4.21 10*6/uL (ref 3.80–5.10)
RDW: 14.9 % (ref 11.0–15.0)
Total Lymphocyte: 25.5 %
WBC mixed population: 685 cells/uL (ref 200–950)
WBC: 8.9 10*3/uL (ref 3.8–10.8)

## 2018-11-01 LAB — LIPID PANEL
Cholesterol: 118 mg/dL (ref <200)
HDL: 58 mg/dL (ref >50)
LDL Cholesterol (Calc): 45 mg/dL (calc)
Non-HDL Cholesterol (Calc): 60 mg/dL (calc) (ref <130)
Total CHOL/HDL Ratio: 2 (calc) (ref <5.0)
Triglycerides: 73 mg/dL (ref <150)

## 2018-11-01 LAB — COMPLETE METABOLIC PANEL WITH GFR
AG Ratio: 1.6 (calc) (ref 1.0–2.5)
ALT: 13 U/L (ref 6–29)
AST: 14 U/L (ref 10–35)
Albumin: 4.2 g/dL (ref 3.6–5.1)
Alkaline phosphatase (APISO): 90 U/L (ref 33–130)
BUN/Creatinine Ratio: 22 (calc) (ref 6–22)
BUN: 21 mg/dL (ref 7–25)
CO2: 27 mmol/L (ref 20–32)
Calcium: 9.8 mg/dL (ref 8.6–10.4)
Chloride: 104 mmol/L (ref 98–110)
Creat: 0.97 mg/dL — ABNORMAL HIGH (ref 0.60–0.93)
GFR, Est African American: 68 mL/min/{1.73_m2} (ref >=60)
GFR, Est Non African American: 58 mL/min/{1.73_m2} — ABNORMAL LOW (ref >=60)
Globulin: 2.7 g/dL (calc) (ref 1.9–3.7)
Glucose, Bld: 119 mg/dL — ABNORMAL HIGH (ref 65–99)
Potassium: 4.4 mmol/L (ref 3.5–5.3)
Sodium: 141 mmol/L (ref 135–146)
Total Bilirubin: 0.4 mg/dL (ref 0.2–1.2)
Total Protein: 6.9 g/dL (ref 6.1–8.1)

## 2018-11-01 LAB — MICROALBUMIN / CREATININE URINE RATIO
Creatinine, Urine: 198 mg/dL (ref 20–275)
Microalb Creat Ratio: 7 mcg/mg creat (ref <30)
Microalb, Ur: 1.4 mg/dL

## 2018-11-01 LAB — VITAMIN D 25 HYDROXY (VIT D DEFICIENCY, FRACTURES): Vit D, 25-Hydroxy: 51 ng/mL (ref 30–100)

## 2018-11-01 LAB — VITAMIN B12: Vitamin B-12: 1556 pg/mL — ABNORMAL HIGH (ref 200–1100)

## 2018-11-06 ENCOUNTER — Ambulatory Visit: Admit: 2018-11-06 | Payer: PPO | Admitting: Gastroenterology

## 2018-11-06 SURGERY — COLONOSCOPY WITH PROPOFOL
Anesthesia: General

## 2018-11-10 ENCOUNTER — Other Ambulatory Visit: Payer: Self-pay | Admitting: Family Medicine

## 2018-11-10 DIAGNOSIS — J3089 Other allergic rhinitis: Secondary | ICD-10-CM

## 2018-11-10 NOTE — Telephone Encounter (Signed)
Refill request for general medication. Flonase   Last office visit 10/31/2018   Follow up 03/02/2019

## 2018-11-16 ENCOUNTER — Ambulatory Visit (INDEPENDENT_AMBULATORY_CARE_PROVIDER_SITE_OTHER): Payer: PPO | Admitting: Pharmacist

## 2018-11-16 DIAGNOSIS — E1129 Type 2 diabetes mellitus with other diabetic kidney complication: Secondary | ICD-10-CM | POA: Diagnosis not present

## 2018-11-16 DIAGNOSIS — E1165 Type 2 diabetes mellitus with hyperglycemia: Secondary | ICD-10-CM | POA: Diagnosis not present

## 2018-11-16 DIAGNOSIS — I10 Essential (primary) hypertension: Secondary | ICD-10-CM | POA: Diagnosis not present

## 2018-11-16 DIAGNOSIS — IMO0002 Reserved for concepts with insufficient information to code with codable children: Secondary | ICD-10-CM

## 2018-11-16 NOTE — Chronic Care Management (AMB) (Signed)
  Chronic Care Management   Initial Visit Note  11/16/2018 Name: Cindy Griffin MRN: [id number] DOB: 1946-09-28  Referred by: Steele Sizer, MD Reason for referral : Chronic Care Management (initial visit med assistance, DM)   Subjective: "The only thing I need assistance with right now if my medications."  Objective:  Assessment: KENZLY ROGOFF is a 72 y.o. year old female who sees Steele Sizer, MD for primary care. Ms. Huneke healthcare plan asked the CCM team to consult the patient for assistance with chronic disease management. The CCM Team met with Ms. Brummond face to face  initial intake.    Goals Addressed    . "I need as much assistance with my medication as I can" (pt-stated)       Clinic Pharmacy Clinical Goal(s):   Interventions:     . "I need to stop eating honey buns" (pt-stated)       Ms. Kea is well known to this CCM RN CM as CM followed patient through [facility] earlier this year after her abdominal surgery. At that time CM reviewed patients A1C and provided basic DM education and diabetic meal planning strategies. Patient has continued to follow ADA diet however she admits to having cravings for sweets. She has been able to lower her A1C from 9.0 11/14/18 to 7.3 10/31/18.  She recently bought a box of Honey Buns but has not had one in 2 days. Ms. Moll did not bring her glucometer today however states her fasting blood glucose levels range from 90-212 when she has a sweet treat in the evening. She has recently been under stress related to her daughters recent stroke and is preparing sweets for her daughters enjoyment. This is contributing to her intake of sugar. "If I cook it I will eat it"  Nurse Case Manager Clinical Goal(s): Over the next 14 days patient will verbalize understanding of how sweets increases her blood sugars.  Interventions: Discussed recent A1C. Praised patient for her hard work decreasing her A1C from 9 to 7.3. Discussed how  simple sugars increase blood sugar. Provided emotional support and reassurance. Discussed utilizing sugar alternatives in her baking.      Plan: Will follow up with patient in 2 weeks   Ms. Fedorchak was given information about Chronic Care Management services today including:  1. CCM service includes personalized support from designated clinical staff supervised by her physician, including individualized plan of care and coordination with other care providers 2. 24/7 contact phone numbers for assistance for urgent and routine care needs. 3. Service will only be billed when office clinical staff spend 20 minutes or more in a month to coordinate care. 4. Only one practitioner may furnish and bill the service in a calendar month. 5. The patient may stop CCM services at any time (effective at the end of the month) by phone call to the office staff. 6. The patient will be responsible for cost sharing (co-pay) of up to 20% of the service fee (after annual deductible is met).  Patient agreed to services and verbal consent obtained.    Elesia Pemberton E. Rollene Rotunda, RN, BSN Nurse Care Coordinator [facility] Care Management  [phone]

## 2018-11-16 NOTE — Patient Instructions (Addendum)
1. Keep up the great work! Continue taking your medications as prescribed.  2. Almyra Free (pharmacist) will begin working on medication assistance applications for [employer], albuterol rescue inhaler, and Ozempic injection for 2020. Please begin to prepare documents that may be needed: statements proving household income 3. Please call Almyra Free (pharmacist) with the name of your blood sugar meter so she can make sure you get the right strips (at a lower cost!)  4. Consider decreasing honey bun intake slowly!   Goals Addressed            This Visit's Progress   . "I need as much assistance with my medication as I can" (pt-stated)       Clinic Pharmacy Clinical Goal(s): Over the next 14 days, patient will gather needed materials for 2020 medication assistance application cycle  Interventions: CCM pharmacist will apply for Anoro Ellipta, albuterol rescue inhaler, and Ozempic medication assistance programs.     [name] "I need to stop eating honey buns" (pt-stated)       Nurse Case Manager Clinical Goal(s): Over the next 14 days patient will verbalize understanding of how sweets increases her blood sugars.  Interventions: Discussed recent A1C. Praised patient for her hard work decreasing her A1C from 9 to 7.3. Discussed how simple sugars increase blood sugar. Provided emotional support and reassurance.        Thank you for allowing the Chronic Care Management team to be a part of your care!   Please call a member of the CCM (Chronic Care Management) Team with any questions or case management needs:   Vanetta Mulders, BSN Nurse Care Coordinator  [phone]  [name], PharmD  Clinical Pharmacist  [phone]

## 2018-11-16 NOTE — Chronic Care Management (AMB) (Addendum)
Chronic Care Management   Note  11/17/2018 Name: Cindy Griffin MRN: [id number] DOB: 1946/11/28   Subjective:   Does the patient  feel that his/her medications are working for him/her?  yes  Has the patient been experiencing any side effects to the medications prescribed?  yes  Does the patient measure his/her own blood glucose at home?  yes   Does the patient measure his/her own blood pressure at home? yes   Does the patient have any problems obtaining medications due to transportation or finances?   yes  Understanding of regimen: good Understanding of indications: good Potential of compliance: good  Objective: Lab Results  Component Value Date   CREATININE 0.97 (H) 10/31/2018   CREATININE 0.68 05/30/2018   CREATININE 0.71 05/29/2018    Lab Results  Component Value Date   HGBA1C 7.3 (A) 10/31/2018    Lipid Panel     Component Value Date/Time   CHOL 118 10/31/2018 1206   CHOL 146 11/21/2015 1125   TRIG 73 10/31/2018 1206   HDL 58 10/31/2018 1206   HDL 62 11/21/2015 1125   CHOLHDL 2.0 10/31/2018 1206   VLDL 25 07/15/2017 0858   LDLCALC 45 10/31/2018 1206    BP Readings from Last 3 Encounters:  10/31/18 136/72  08/08/18 110/64  08/01/18 118/60    No Known Allergies  Medications Reviewed Today    Reviewed by Steele Sizer, MD (Physician) on 10/31/18 at 1134  Med List Status: <None>  Medication Order Taking? Sig Documenting Provider Last Dose Status Informant  acetaminophen (TYLENOL) 500 MG tablet [id number] Yes Take 1,000 mg by mouth every 6 (six) hours as needed for moderate pain or headache.  [provider] Taking Active Self  Ascorbic Acid (VITAMIN C) 1000 MG tablet [id number] Yes Take 1,000 mg by mouth daily. [provider] Taking Active Self  aspirin EC 81 MG tablet [id number] Yes Take 1 tablet (81 mg total) by mouth daily. Steele Sizer, MD Taking Active Self  atorvastatin (LIPITOR) 80 MG tablet [id number] Yes TAKE 1 TABLET BY MOUTH  ONCE DAILY Sowles, Drue Stager, MD Taking Active   Blood Glucose Monitoring Suppl (ACCU-CHEK NANO SMARTVIEW) W/DEVICE KIT [id number] Yes 1 Device by Does not apply route 3 (three) times daily. Steele Sizer, MD Taking Active Self  Carboxymethylcellul-Glycerin (LUBRICATING EYE DROPS OP) [id number] Yes Place 2 drops into both eyes 2 (two) times daily as needed (for dry eyes). [provider] Taking Active Self  cetirizine (ZYRTEC) 10 MG tablet [id number] Yes Take 10 mg by mouth daily as needed for allergies.  [provider] Taking Active Self           Med Note Larwance Sachs A   Thu May 06, 2016 12:00 PM)    Cholecalciferol (VITAMIN D-3) 1000 units CAPS [id number] Yes Take 1,000 Units by mouth daily.  [provider] Taking Active Self  ferrous sulfate 325 (65 FE) MG EC tablet [id number] Yes Take 325 mg by mouth daily.  [provider] Taking Active Self           Med Note Larwance Sachs A   Thu May 06, 2016 12:00 PM)    fluticasone (FLONASE) 50 MCG/ACT nasal spray [id number] Yes Place 2 sprays into both nostrils daily. Steele Sizer, MD Taking Active Self  glipiZIDE (GLUCOTROL XL) 10 MG 24 hr tablet [id number] Yes TAKE 1 TABLET BY MOUTH ONCE DAILY WITH [place], Drue Stager, MD Taking Active   glucose blood (ADVOCATE REDI-CODE) test strip [id number] Yes 1 each by  Other route QID. Steele Sizer, MD Taking Active Self  hydrochlorothiazide (HYDRODIURIL) 25 MG tablet [id number] Yes Take 1 tablet (25 mg total) by mouth daily. Steele Sizer, MD Taking Active Self           Med Note Rollene Rotunda, [place] Aug 08, 2018 10:09 AM) Patient states she was instructed to cut medication in half  lisinopril (PRINIVIL,ZESTRIL) 40 MG tablet [id number] Yes TAKE 1 TABLET BY MOUTH AT BEDTIME Steele Sizer, MD Taking Active   meclizine (ANTIVERT) 25 MG tablet [id number] Yes Take 1 tablet (25 mg total) by mouth every 6 (six) hours. Steele Sizer, MD Taking Active   metFORMIN  (GLUCOPHAGE-XR) 750 MG 24 hr tablet [id number] Yes Take 1 tablet (750 mg total) by mouth daily with breakfast. Steele Sizer, MD Taking Active Self  metoCLOPramide (REGLAN) 10 MG tablet [id number] Yes Take 1 tablet (10 mg total) by mouth 4 (four) times daily -  before meals and at bedtime. Ileana Roup, MD Taking Active   Multiple Vitamins-Minerals (MULTIVITAMIN PO) [id number] Yes Take 1 tablet by mouth daily. [provider] Taking Active Self  Omega-3 Fatty Acids (OMEGA-3 CF PO) [id number] Yes Take 660 mg by mouth daily. Over the counter product [provider] Taking Active Self  ondansetron (ZOFRAN-ODT) 4 MG disintegrating tablet [id number] Yes DISSOLVE 1 TABLET IN MOUTH EVERY 8 HOURS AS NEEDED FOR NAUSEA AND VOMITING [provider] Taking Active Self  pioglitazone (ACTOS) 15 MG tablet [id number] Yes Take 1 tablet (15 mg total) by mouth daily. Steele Sizer, MD Taking Active Self  polyethylene glycol (GOLYTELY) 236 g solution [id number] No Drink one 8 oz glass every 20 minutes until entire container is finished  Patient not taking:  Reported on 10/31/2018   [name], MD Not Taking Active   PROAIR HFA [phone] Base) MCG/ACT inhaler [id number] Yes INHALE 2 PUFFS INTO LUNGS EVERY 6 HOURS AS NEEDED FOR WHEEZING OR SHORTNESS OF Cherlynn June, Drue Stager, MD Taking Active   Semaglutide [facility]) 1 MG/DOSE SOPN [id number]  Inject 0.5-1 mg into the skin once a week.  Patient taking differently:  Inject 0.5 mg into the skin every Monday.    Steele Sizer, MD  Active Self  traMADol Veatrice Bourbon) 50 MG tablet [id number] Yes TAKE ONE HALF TO ONE TABLET BY MOUTH TWICE DAILY AS NEEDED [provider] Taking Active   umeclidinium-vilanterol (ANORO ELLIPTA) 62.5-25 MCG/INH AEPB [id number] Yes Inhale 1 puff into the lungs daily. Steele Sizer, MD Taking Active   valACYclovir (VALTREX) 1000 MG tablet [id number] Yes Take 1 tablet (1,000 mg total) by mouth 2 (two) times daily.  Steele Sizer, MD Taking Active          Assessment:   Indications for all medications: _0  Yes       _1  No  Adherence Review  _2  Excellent (no doses missed/week)     _3  Good (no more than 1 dose missed/week)     _4  Partial (2-3 doses missed/week)     _5  Poor (>3 doses missed/week)  Intervention  YES NO  Explanation   Adherence      Cannot afford medication _6  _7  Ozempic, Anoro Ellipta   Cannot self-administer medication appropriately _8  _9     Does not understand directions _10  _11     Prefers not to take _12  _13     Product unavailable _14  _15     Forgets to take _16  _17     Dose/Frequency      Dose form inappropriate for patient _18  _19   Dosing consideration _0  _1  Need to clarify HCTZ dose   Different drug needed      Condition refractory to medication _2  _3     More effective medication available _4  _5     More cost-effective medication available _6  _7  ProVentil medication assistance program does not require a minimum out of pocket expenditure  Patient is not using preferred brand of glucometer and strips   Other pertinent pharmacist  counseling      Goals Addressed            This Visit's Progress   . "I need as much assistance with my medication as I can" (pt-stated)       Clinic Pharmacy Clinical Goal(s):  Over the next 10 days, [name] will provide the necessary supplementary documents (proof of out of pocket prescription expenditure, proof of household income) needed for medication assistance applications to CCM pharmacist.   Interventions:  CCM pharmacist will apply for medication assistance program for Anoro Ellipta made by [place], for Ozempic made by NovoNordisk, and Proventil made by [employer]     . "I need to stop eating honey buns" (pt-stated)       Nurse Case Manager Clinical Goal(s): Over the next 14 days patient will verbalize understanding of how sweets increases her blood sugars.  Interventions: Discussed recent A1C. Praised patient for her hard work  decreasing her A1C from 9 to 7.3. Discussed how simple sugars increase blood sugar. Provided emotional support and reassurance.        Time:  Time spent counseling patient: 50 minutes Additional time spent on charting: 10 minutes  Plan: Recommendations discussed with provider: - Clarify HCTZ dose - Change ProAir to ProVentil for better medication assistance program - Send in new prescription for diabetes testing supplies (meter, strips)  for the preferred brands for HTA: OneTouch, Freestyle, Precision  Recommendations discussed with patient:   Medication Samples have been provided to the patient.  Drug: Ozempic Strength: 0.31m Qty: 1 pen  LOT: [id number] Exp.Date: 01/2021 Dosing instructions: Inject 0.518mSQ once weekly.   The patient has been instructed regarding the correct time, dose, and frequency of taking this medication, including desired effects and most common side effects.   JuRuben ReasonPharmD Clinical Pharmacist [facility] [phone]

## 2018-11-27 ENCOUNTER — Ambulatory Visit (INDEPENDENT_AMBULATORY_CARE_PROVIDER_SITE_OTHER): Payer: PPO

## 2018-11-27 VITALS — BP 136/72 | Ht 64.0 in | Wt 232.0 lb

## 2018-11-27 DIAGNOSIS — C182 Malignant neoplasm of ascending colon: Secondary | ICD-10-CM | POA: Diagnosis not present

## 2018-11-27 DIAGNOSIS — Z23 Encounter for immunization: Secondary | ICD-10-CM

## 2018-11-30 ENCOUNTER — Telehealth: Payer: Self-pay

## 2018-11-30 ENCOUNTER — Other Ambulatory Visit: Payer: Self-pay | Admitting: Family Medicine

## 2018-11-30 ENCOUNTER — Ambulatory Visit: Payer: Self-pay | Admitting: Pharmacist

## 2018-11-30 DIAGNOSIS — E1165 Type 2 diabetes mellitus with hyperglycemia: Principal | ICD-10-CM

## 2018-11-30 DIAGNOSIS — IMO0002 Reserved for concepts with insufficient information to code with codable children: Secondary | ICD-10-CM

## 2018-11-30 DIAGNOSIS — J411 Mucopurulent chronic bronchitis: Secondary | ICD-10-CM

## 2018-11-30 DIAGNOSIS — E1122 Type 2 diabetes mellitus with diabetic chronic kidney disease: Secondary | ICD-10-CM

## 2018-11-30 DIAGNOSIS — E1129 Type 2 diabetes mellitus with other diabetic kidney complication: Secondary | ICD-10-CM

## 2018-11-30 NOTE — Telephone Encounter (Signed)
Refill request for diabetic medication:   Glipizide 10 mg   Last office visit pertaining to diabetes: 10/31/18   Lab Results  Component Value Date   HGBA1C 7.3 (A) 10/31/2018     Follow-ups on file. 02/27/19

## 2018-11-30 NOTE — Chronic Care Management (AMB) (Signed)
  Chronic Care Management   Note  11/30/2018 Name: Cindy Griffin MRN: [id number] DOB: 17-May-1946   Follow up phone call placed to patient to set up a medication assistance plan for 2020.   Was unable to reach patient via telephone today and have left HIPAA compliant voicemail asking patient to return my call. (unsuccessful outreach #1).  Plan: Will follow-up within 3-5  business days via telephone.   [name], PharmD Clinical Pharmacist [facility] [phone]

## 2018-12-05 ENCOUNTER — Encounter: Payer: Self-pay | Admitting: Radiology

## 2018-12-05 ENCOUNTER — Other Ambulatory Visit: Payer: Self-pay | Admitting: Surgery

## 2018-12-05 ENCOUNTER — Telehealth: Payer: Self-pay

## 2018-12-05 ENCOUNTER — Ambulatory Visit: Payer: Self-pay | Admitting: Pharmacist

## 2018-12-05 ENCOUNTER — Ambulatory Visit
Admission: RE | Admit: 2018-12-05 | Discharge: 2018-12-05 | Disposition: A | Discharge status: Home / Self Care | Payer: PPO | Source: Ambulatory Visit | Attending: Surgery | Admitting: Surgery

## 2018-12-05 DIAGNOSIS — C182 Malignant neoplasm of ascending colon: Secondary | ICD-10-CM

## 2018-12-05 DIAGNOSIS — K5900 Constipation, unspecified: Secondary | ICD-10-CM | POA: Diagnosis not present

## 2018-12-05 DIAGNOSIS — J411 Mucopurulent chronic bronchitis: Secondary | ICD-10-CM

## 2018-12-05 DIAGNOSIS — IMO0002 Reserved for concepts with insufficient information to code with codable children: Secondary | ICD-10-CM

## 2018-12-05 DIAGNOSIS — E1129 Type 2 diabetes mellitus with other diabetic kidney complication: Secondary | ICD-10-CM

## 2018-12-05 DIAGNOSIS — E1165 Type 2 diabetes mellitus with hyperglycemia: Principal | ICD-10-CM

## 2018-12-05 MED ORDER — IOPAMIDOL (ISOVUE-300) INJECTION 61%
125.0000 mL | Freq: Once | INTRAVENOUS | Status: AC | PRN
  Administered 2018-12-05: 100 mL via INTRAVENOUS

## 2018-12-05 NOTE — Chronic Care Management (AMB) (Signed)
  Chronic Care Management   Note  12/05/2018 Name: Cindy Griffin MRN: [id number] DOB: [birth year]   Follow up phone call placed to patient to set up a medication assistance plan for 2020, verify successful pick up of glucometer and diabetic testing supplies,   Was unable to reach patient via telephone today and have left HIPAA compliant voicemail asking patient to return my call. (unsuccessful outreach #2).  Plan: Will follow-up within 3-5  business days via telephone.   [name], PharmD Clinical Pharmacist [facility] [phone]

## 2018-12-06 ENCOUNTER — Other Ambulatory Visit: Payer: Self-pay | Admitting: Family Medicine

## 2018-12-06 ENCOUNTER — Ambulatory Visit: Payer: Self-pay | Admitting: Pharmacist

## 2018-12-06 DIAGNOSIS — E1165 Type 2 diabetes mellitus with hyperglycemia: Principal | ICD-10-CM

## 2018-12-06 DIAGNOSIS — IMO0002 Reserved for concepts with insufficient information to code with codable children: Secondary | ICD-10-CM

## 2018-12-06 DIAGNOSIS — E1169 Type 2 diabetes mellitus with other specified complication: Secondary | ICD-10-CM

## 2018-12-06 DIAGNOSIS — I10 Essential (primary) hypertension: Secondary | ICD-10-CM

## 2018-12-06 DIAGNOSIS — E785 Hyperlipidemia, unspecified: Secondary | ICD-10-CM

## 2018-12-06 DIAGNOSIS — J411 Mucopurulent chronic bronchitis: Secondary | ICD-10-CM

## 2018-12-06 DIAGNOSIS — E1129 Type 2 diabetes mellitus with other diabetic kidney complication: Secondary | ICD-10-CM

## 2018-12-06 MED ORDER — ACCU-CHEK NANO SMARTVIEW W/DEVICE KIT: 1.0000 | PACK | Freq: Three times a day (TID) | 0 refills | Status: DC

## 2018-12-06 NOTE — Patient Instructions (Signed)
The patient verbalized understanding of instructions provided today and declined a print copy of patient instruction materials.

## 2018-12-06 NOTE — Chronic Care Management (AMB) (Signed)
  Chronic Care Management   Follow Up Note   12/06/2018 Name: Cindy Griffin MRN: [id number] DOB: 1946-08-20  Referred by: Steele Sizer, MD Reason for referral : Chronic Care Management (Outreach )    Subjective: "I am doing okay I think"   Objective:    Assessment: Cindy Griffin is a 73 year old female engaged with the CCM team for assistance with obesity, T2DM, chronic pain, and medication assistance. Initial CCM appointment 11/16/18. Follow up call today was to ensure that patient received a glucometer and diabetes testing strips through her insurance coverage. Cindy Griffin has not received these items, and in chart review it is noted that it was sent to [place]. I have resent the prescriptions to the [facility] on [address] per patient request.   Patient would like to wait to apply for medication assistance as "2020 just started". CCM pharmacist to follow up in 30 days.   Patient states that she is in a great deal of chronic pain and that she would like to know if Dr. Ancil Boozer would be willing to prescribe Celebrex for her. CCM pharmacist will pass this message along to Dr. Ancil Boozer.  I also looked up her copay for this medication and it will be $45 monthly. [employer] does have a medication assistance program for Celebrex that can be applied for.   Patient states that Dr. Dema Severin told her that Dr. Ancil Boozer needs to prescribe Reglan moving forward. Per chart review, Dr. Ancil Boozer has already taken over prescriptive authority for Reglan in 10/2018,   Goals Addressed            This Visit's Progress   . "I need as much assistance with my medication as I can" (pt-stated)       Clinic Pharmacy Clinical Goal(s): (updated)  Over the next 30 days, Cindy Griffin will provide the necessary supplementary documents (proof of out of pocket prescription expenditure, proof of household income) needed for medication assistance applications to CCM pharmacist.   Interventions:  CCM  pharmacist will apply for medication assistance program for Anoro Ellipta made by [place], for Ozempic made by NovoNordisk, and Proventil made by [employer]          Plan: Follow up in one month with patient regarding medication assistance.   Recommendation for prescriber: patient is inquiring if Dr. Ancil Boozer can prescribe Celebrex for joint pain. Will forward request to Dr. Ancil Boozer to review.    [name], PharmD Clinical Pharmacist [facility] [phone]

## 2018-12-07 ENCOUNTER — Ambulatory Visit (INDEPENDENT_AMBULATORY_CARE_PROVIDER_SITE_OTHER): Payer: PPO | Admitting: Pharmacist

## 2018-12-07 ENCOUNTER — Telehealth: Payer: Self-pay

## 2018-12-07 DIAGNOSIS — IMO0002 Reserved for concepts with insufficient information to code with codable children: Secondary | ICD-10-CM

## 2018-12-07 DIAGNOSIS — J411 Mucopurulent chronic bronchitis: Secondary | ICD-10-CM

## 2018-12-07 DIAGNOSIS — E1165 Type 2 diabetes mellitus with hyperglycemia: Secondary | ICD-10-CM | POA: Diagnosis not present

## 2018-12-07 DIAGNOSIS — E1129 Type 2 diabetes mellitus with other diabetic kidney complication: Secondary | ICD-10-CM | POA: Diagnosis not present

## 2018-12-08 ENCOUNTER — Other Ambulatory Visit: Payer: Self-pay | Admitting: General Surgery

## 2018-12-08 DIAGNOSIS — C182 Malignant neoplasm of ascending colon: Secondary | ICD-10-CM

## 2018-12-11 NOTE — Chronic Care Management (AMB) (Signed)
  Chronic Care Management   Follow Up Note   12/11/2018 Name: Cindy Griffin MRN: [id number] DOB: 1946-06-09  Referred by: Steele Sizer, MD Griffin for referral : Chronic Care Management (Pharmacy follow up)   Subjective: "I still haven't gotten my glucometer"    Assessment: Cindy Griffin is a 73 year old female patient of Dr. Ancil Boozer, previously engaged with CCM team (initial appointment 11/16/18). The CCM team is assisting Cindy Griffin with her medications and her T2DM. Today for follow up telephone with CCM pharmacist, Cindy Griffin states that she still hasn't received her glucometer and test supplies. It seems that this was accidentally faxed to the incorrect pharmacy. Cindy Griffin additionally stated that she no longer wants assistance for her medications and "she's fine".   Goals Addressed            This Visit's Progress   . COMPLETED: "I need as much assistance with my medication as I can" (pt-stated)       Clinic Pharmacy Clinical Goal(s): (updated)  Over the next 30 days, Cindy Griffin will provide the necessary supplementary documents (proof of out of pocket prescription expenditure, proof of household income) needed for medication assistance applications to CCM pharmacist.   Interventions:  CCM pharmacist will apply for medication assistance program for Anoro Ellipta made by [place], for Ozempic made by NovoNordisk, and Proventil made by [employer]     . "I need to stop eating honey buns" (pt-stated)       Nurse Case Manager Clinical Goal(s): Over the next 14 days patient will verbalize understanding of how sweets increases her blood sugars.  Interventions:  12/17/18: Patient has not picked up glucometer from pharmacy.   Discussed recent A1C. Praised patient for her hard work decreasing her A1C from 9 to 7.3. Discussed how simple sugars increase blood sugar. Provided emotional support and reassurance.       Plan:  - Refaxed prescription for glucometer and test strips from patient's  chart. Called pharmacist to verify prescriptions were received  - CCM pharmacy needs have been met at this time. Will follow up as necessary with patient and RNCM.    Cindy Griffin, PharmD Clinical Pharmacist [facility] [phone]

## 2018-12-11 NOTE — Patient Instructions (Signed)
Goals Addressed            This Visit's Progress   . COMPLETED: "I need as much assistance with my medication as I can" (pt-stated)       Clinic Pharmacy Clinical Goal(s): (updated)  Over the next 30 days, [name] will provide the necessary supplementary documents (proof of out of pocket prescription expenditure, proof of household income) needed for medication assistance applications to CCM pharmacist.   Interventions:  CCM pharmacist will apply for medication assistance program for Anoro Ellipta made by [place], for Ozempic made by NovoNordisk, and Proventil made by [employer]     . "I need to stop eating honey buns" (pt-stated)       Nurse Case Manager Clinical Goal(s): Over the next 14 days patient will verbalize understanding of how sweets increases her blood sugars.  Interventions:  12/17/18: Patient has not picked up glucometer from pharmacy.   Discussed recent A1C. Praised patient for her hard work decreasing her A1C from 9 to 7.3. Discussed how simple sugars increase blood sugar. Provided emotional support and reassurance.        1. Almyra Free (CCM pharmacist) is always available to assist you with your medication assistance applications whenever you are ready.  2. Almyra Free has refaxed glucomter and testing supplies to your pharmacy. Please pick up these supplies and begin testing your blood sugar as instructed.   Please call a member of the CCM (Chronic Care Management) Team with any questions or case management needs:   Vanetta Mulders, BSN Nurse Care Coordinator  [phone]  [name], PharmD  Clinical Pharmacist  [phone]  The patient verbalized understanding of instructions provided today and declined a print copy of patient instruction materials.               1

## 2018-12-12 ENCOUNTER — Ambulatory Visit
Admission: RE | Admit: 2018-12-12 | Discharge: 2018-12-12 | Disposition: A | Discharge status: Home / Self Care | Payer: PPO | Source: Ambulatory Visit | Attending: General Surgery | Admitting: General Surgery

## 2018-12-12 DIAGNOSIS — C3490 Malignant neoplasm of unspecified part of unspecified bronchus or lung: Secondary | ICD-10-CM | POA: Diagnosis not present

## 2018-12-12 DIAGNOSIS — C182 Malignant neoplasm of ascending colon: Secondary | ICD-10-CM

## 2018-12-12 MED ORDER — IOPAMIDOL (ISOVUE-300) INJECTION 61%
75.0000 mL | Freq: Once | INTRAVENOUS | Status: AC | PRN
  Administered 2018-12-12: 75 mL via INTRAVENOUS

## 2018-12-13 ENCOUNTER — Telehealth: Payer: Self-pay

## 2018-12-13 NOTE — Telephone Encounter (Signed)
-----   Message from [name], MD sent at 12/13/2018 11:42 AM EST ----- Regarding: RE: Elevated CEA Good morning   I will have Darren Caldron schedule her colonoscopy within 2 weeks . Will let you know what I find   Regards  Kiran  ----- Message ----- From: Ileana Roup, MD Sent: 12/13/2018  11:36 AM EST To: [name], MD Subject: Elevated CEA                                   Cindy Griffin -  I hope all is well. [name] had a right hemicolectomy for a early stage colon cancer 04/2018. She was to undergo repeat colonoscopy at 1 yr (~02/2019) but in surveillance has had an elevated CEA - 7 - not dramatic elevation but up. Her repeat CT scans show no evidence of metastatic disease; she had a T1N0 colon cancer, so would have been surprised if this came back with metastatic disease... would you be able to repeat her colonoscopy sooner than originally planned to ensure nothing else in the colon? She also has hx of uterine ca so I'm having her see Gyn back as well. Let me know what you think and thank you for your help with her.  Gerald Stabs

## 2018-12-13 NOTE — Telephone Encounter (Signed)
Called pt to schedule colonoscopy procedure. Unable to contact LVM to return call

## 2018-12-14 NOTE — Telephone Encounter (Signed)
Pt left vm she is returning a call please call her home number

## 2018-12-14 NOTE — Telephone Encounter (Signed)
Patient came in the office to schedule a colonoscopy with Dr Vicente Males. Lurline Hare is in clinic and was unable to assist patient.Please call to schedule with patient. Call patient's [phone] if no answer call her c#[phone].

## 2018-12-15 ENCOUNTER — Other Ambulatory Visit: Payer: Self-pay

## 2018-12-15 DIAGNOSIS — Z86010 Personal history of colonic polyps: Secondary | ICD-10-CM

## 2018-12-15 DIAGNOSIS — C183 Malignant neoplasm of hepatic flexure: Secondary | ICD-10-CM

## 2018-12-15 NOTE — Telephone Encounter (Signed)
Spoke with pt regarding colonoscopy procedure. We have scheduled pt procedure. Pt states she still has the instructions and bowel prep prescription from her cancelled procedure back in December 2019.

## 2018-12-25 ENCOUNTER — Encounter
Admission: RE | Disposition: A | Discharge status: Home / Self Care | Payer: Self-pay | Source: Home / Self Care | Attending: Gastroenterology

## 2018-12-25 ENCOUNTER — Ambulatory Visit
Admission: RE | Admit: 2018-12-25 | Discharge: 2018-12-25 | Disposition: A | Discharge status: Home / Self Care | Payer: PPO | Attending: Gastroenterology | Admitting: Gastroenterology

## 2018-12-25 ENCOUNTER — Ambulatory Visit: Payer: PPO | Admitting: Anesthesiology

## 2018-12-25 ENCOUNTER — Encounter: Payer: Self-pay | Admitting: *Deleted

## 2018-12-25 DIAGNOSIS — Z96653 Presence of artificial knee joint, bilateral: Secondary | ICD-10-CM | POA: Insufficient documentation

## 2018-12-25 DIAGNOSIS — E1129 Type 2 diabetes mellitus with other diabetic kidney complication: Secondary | ICD-10-CM | POA: Diagnosis not present

## 2018-12-25 DIAGNOSIS — C183 Malignant neoplasm of hepatic flexure: Secondary | ICD-10-CM

## 2018-12-25 DIAGNOSIS — Z7982 Long term (current) use of aspirin: Secondary | ICD-10-CM | POA: Insufficient documentation

## 2018-12-25 DIAGNOSIS — Z85038 Personal history of other malignant neoplasm of large intestine: Secondary | ICD-10-CM | POA: Insufficient documentation

## 2018-12-25 DIAGNOSIS — E785 Hyperlipidemia, unspecified: Secondary | ICD-10-CM | POA: Insufficient documentation

## 2018-12-25 DIAGNOSIS — M549 Dorsalgia, unspecified: Secondary | ICD-10-CM | POA: Diagnosis not present

## 2018-12-25 DIAGNOSIS — K219 Gastro-esophageal reflux disease without esophagitis: Secondary | ICD-10-CM | POA: Diagnosis not present

## 2018-12-25 DIAGNOSIS — Z1211 Encounter for screening for malignant neoplasm of colon: Secondary | ICD-10-CM | POA: Insufficient documentation

## 2018-12-25 DIAGNOSIS — Z980 Intestinal bypass and anastomosis status: Secondary | ICD-10-CM | POA: Diagnosis not present

## 2018-12-25 DIAGNOSIS — E119 Type 2 diabetes mellitus without complications: Secondary | ICD-10-CM | POA: Insufficient documentation

## 2018-12-25 DIAGNOSIS — E559 Vitamin D deficiency, unspecified: Secondary | ICD-10-CM | POA: Insufficient documentation

## 2018-12-25 DIAGNOSIS — Z6838 Body mass index (BMI) 38.0-38.9, adult: Secondary | ICD-10-CM | POA: Diagnosis not present

## 2018-12-25 DIAGNOSIS — Z79899 Other long term (current) drug therapy: Secondary | ICD-10-CM | POA: Diagnosis not present

## 2018-12-25 DIAGNOSIS — Z7951 Long term (current) use of inhaled steroids: Secondary | ICD-10-CM | POA: Insufficient documentation

## 2018-12-25 DIAGNOSIS — I10 Essential (primary) hypertension: Secondary | ICD-10-CM | POA: Insufficient documentation

## 2018-12-25 DIAGNOSIS — Z8542 Personal history of malignant neoplasm of other parts of uterus: Secondary | ICD-10-CM | POA: Insufficient documentation

## 2018-12-25 DIAGNOSIS — J449 Chronic obstructive pulmonary disease, unspecified: Secondary | ICD-10-CM | POA: Diagnosis not present

## 2018-12-25 DIAGNOSIS — Z7984 Long term (current) use of oral hypoglycemic drugs: Secondary | ICD-10-CM | POA: Diagnosis not present

## 2018-12-25 DIAGNOSIS — G8929 Other chronic pain: Secondary | ICD-10-CM | POA: Insufficient documentation

## 2018-12-25 DIAGNOSIS — Z9049 Acquired absence of other specified parts of digestive tract: Secondary | ICD-10-CM | POA: Insufficient documentation

## 2018-12-25 DIAGNOSIS — Z86010 Personal history of colonic polyps: Secondary | ICD-10-CM

## 2018-12-25 DIAGNOSIS — D126 Benign neoplasm of colon, unspecified: Secondary | ICD-10-CM | POA: Diagnosis not present

## 2018-12-25 DIAGNOSIS — D124 Benign neoplasm of descending colon: Secondary | ICD-10-CM | POA: Insufficient documentation

## 2018-12-25 HISTORY — DX: Elevated carcinoembryonic antigen (CEA): R97.0

## 2018-12-25 HISTORY — PX: COLONOSCOPY WITH PROPOFOL: SHX5780

## 2018-12-25 LAB — GLUCOSE, CAPILLARY: Glucose-Capillary: 136 mg/dL — ABNORMAL HIGH (ref 70–99)

## 2018-12-25 SURGERY — COLONOSCOPY WITH PROPOFOL
Anesthesia: General

## 2018-12-25 MED ORDER — SODIUM CHLORIDE 0.9 % IV SOLN
INTRAVENOUS | Status: DC
  Administered 2018-12-25: 07:00:00 via INTRAVENOUS

## 2018-12-25 MED ORDER — PROPOFOL 500 MG/50ML IV EMUL
INTRAVENOUS | Status: DC | PRN
  Administered 2018-12-25: 130 ug/kg/min via INTRAVENOUS

## 2018-12-25 MED ORDER — ONDANSETRON HCL 4 MG/2ML IJ SOLN: 4.0000 mg | Freq: Once | INTRAMUSCULAR | Status: DC | PRN

## 2018-12-25 MED ORDER — LIDOCAINE HCL (CARDIAC) PF 100 MG/5ML IV SOSY
PREFILLED_SYRINGE | INTRAVENOUS | Status: DC | PRN
  Administered 2018-12-25: 50 mg via INTRAVENOUS

## 2018-12-25 MED ORDER — FENTANYL CITRATE (PF) 100 MCG/2ML IJ SOLN: 25.0000 ug | INTRAMUSCULAR | Status: DC | PRN

## 2018-12-25 MED ORDER — PROPOFOL 10 MG/ML IV BOLUS
INTRAVENOUS | Status: DC | PRN
  Administered 2018-12-25: 20 mg via INTRAVENOUS
  Administered 2018-12-25: 80 mg via INTRAVENOUS

## 2018-12-25 MED ORDER — PROPOFOL 10 MG/ML IV BOLUS
INTRAVENOUS | Status: AC
  Filled 2018-12-25: qty 20

## 2018-12-25 MED ORDER — LIDOCAINE HCL (PF) 2 % IJ SOLN
INTRAMUSCULAR | Status: AC
  Filled 2018-12-25: qty 10
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
## 2018-12-25 NOTE — Anesthesia Postprocedure Evaluation (Signed)
Anesthesia Post Note  Patient: [name]  Procedure(s) Performed: COLONOSCOPY WITH PROPOFOL (N/A )  Patient location during evaluation: PACU Anesthesia Type: General Level of consciousness: awake and alert and oriented Pain management: pain level controlled Vital Signs Assessment: post-procedure vital signs reviewed and stable Respiratory status: spontaneous breathing Cardiovascular status: blood pressure returned to baseline Anesthetic complications: no     Last Vitals:  Vitals:   12/25/18 0839 12/25/18 0849  BP: 131/77 125/81  Pulse: 75 70  Resp: 17 11  Temp:    SpO2: 100% 99%    Last Pain:  Vitals:   12/25/18 0849  TempSrc:   PainSc: 0-No pain                 Zakir Henner

## 2018-12-25 NOTE — Transfer of Care (Signed)
Immediate Anesthesia Transfer of Care Note  Patient: Cindy Griffin  Procedure(s) Performed: COLONOSCOPY WITH PROPOFOL (N/A )  Patient Location: PACU and Endoscopy Unit  Anesthesia Type:General  Level of Consciousness: awake, alert , oriented and patient cooperative  Airway & Oxygen Therapy: Patient Spontanous Breathing  Post-op Assessment: Report given to RN and Post -op Vital signs reviewed and stable  Post vital signs: Reviewed and stable  Last Vitals:  Vitals Value Taken Time  BP    Temp    Pulse 85 12/25/2018  8:20 AM  Resp 27 12/25/2018  8:20 AM  SpO2 97 % 12/25/2018  8:20 AM  Vitals shown include unvalidated device data.  Last Pain: There were no vitals filed for this visit.       Complications: No apparent anesthesia complications

## 2018-12-25 NOTE — Anesthesia Post-op Follow-up Note (Signed)
Anesthesia QCDR form completed.        

## 2018-12-25 NOTE — Op Note (Signed)
[facility] Patient Name: Cindy Griffin Procedure Date: 12/25/2018 7:34 AM MRN: [id number] Account #: [id number] Date of Birth: [birth year] Admit Type: Outpatient Age: 73 Room: [facility] ENDO ROOM 4 Gender: Female Note Status: Finalized Procedure:            Colonoscopy Indications:          High risk colon cancer surveillance: Personal history                        of colon cancer Providers:            [name] MD, MD Referring MD:         Bethena Roys. Sowles, MD (Referring MD) Medicines:            Monitored Anesthesia Care Complications:        No immediate complications. Procedure:            Pre-Anesthesia Assessment:                       - Prior to the procedure, a History and Physical was                        performed, and patient medications, allergies and                        sensitivities were reviewed. The patient's tolerance of                        previous anesthesia was reviewed.                       - The risks and benefits of the procedure and the                        sedation options and risks were discussed with the                        patient. All questions were answered and informed                        consent was obtained.                       - ASA Grade Assessment: III - A patient with severe                        systemic disease.                       After obtaining informed consent, the colonoscope was                        passed under direct vision. Throughout the procedure,                        the patient's blood pressure, pulse, and oxygen                        saturations were monitored continuously. The  Colonoscope was introduced through the anus and                        advanced to the the ileocolonic anastomosis. The                        colonoscopy was performed with ease. The patient                        tolerated the procedure well. The quality of the bowel                    preparation was good. Findings:      The perianal and digital rectal examinations were normal.      Two sessile polyps were found in the descending colon. The polyps were 3       to 4 mm in size. These polyps were removed with a cold biopsy forceps.       Resection and retrieval were complete.      There was evidence of a prior end-to-side ileo-colonic anastomosis in       the transverse colon. This was patent and was characterized by       congestion and erythema. Biopsies were taken with a cold forceps for       histology.      The exam was otherwise without abnormality on direct and retroflexion       views. Impression:           - Two 3 to 4 mm polyps in the descending colon, removed                        with a cold biopsy forceps. Resected and retrieved.                       - Patent end-to-side ileo-colonic anastomosis,                        characterized by congestion and erythema. Biopsied.                       - The examination was otherwise normal on direct and                        retroflexion views. Recommendation:       - Discharge patient to home (with escort).                       - Resume previous diet.                       - Continue present medications.                       - Await pathology results.                       - Repeat colonoscopy in 3 years for surveillance based                        on pathology results. Procedure Code(s):    --- Professional ---  45380, Colonoscopy, flexible; with biopsy, single or                        multiple Diagnosis Code(s):    --- Professional ---                       Z85.038, Personal history of other malignant neoplasm                        of large intestine                       D12.4, Benign neoplasm of descending colon                       Z98.0, Intestinal bypass and anastomosis status CPT copyright 2018 American Medical Association. All rights reserved. The codes  documented in this report are preliminary and upon coder review may  be revised to meet current compliance requirements. [name], MD [name] MD, MD 12/25/2018 8:17:52 AM This report has been signed electronically. Number of Addenda: 0 Note Initiated On: 12/25/2018 7:34 AM Scope Withdrawal Time: 0 hours 12 minutes 26 seconds  Total Procedure Duration: 0 hours 15 minutes 1 second       [facility]

## 2018-12-26 LAB — SURGICAL PATHOLOGY

## 2018-12-27 ENCOUNTER — Encounter: Payer: Self-pay | Admitting: Gastroenterology

## 2018-12-29 ENCOUNTER — Encounter: Payer: Self-pay | Admitting: Gastroenterology

## 2019-01-04 ENCOUNTER — Ambulatory Visit (INDEPENDENT_AMBULATORY_CARE_PROVIDER_SITE_OTHER): Payer: PPO

## 2019-01-04 DIAGNOSIS — I10 Essential (primary) hypertension: Secondary | ICD-10-CM | POA: Diagnosis not present

## 2019-01-04 DIAGNOSIS — E1165 Type 2 diabetes mellitus with hyperglycemia: Secondary | ICD-10-CM | POA: Diagnosis not present

## 2019-01-04 DIAGNOSIS — E1129 Type 2 diabetes mellitus with other diabetic kidney complication: Secondary | ICD-10-CM | POA: Diagnosis not present

## 2019-01-04 DIAGNOSIS — IMO0002 Reserved for concepts with insufficient information to code with codable children: Secondary | ICD-10-CM

## 2019-01-04 NOTE — Patient Instructions (Addendum)
  Thank you allowing the Chronic Care Management Team to be a part of your care! It was a pleasure speaking with you today!  1. Continue to check you blood sugar daily. Remember to check it before a meal or at least 2 hours after a meal for you most accurate readings. 2. You are making smart choices when you have a sweet tooth. I am very proud of you! Glucerna, frozen cool whip with berries is a great alternative to Honey Buns 3. The CCM Pharmacist will call you next week. Please contact CCM Team prior to you running out of your Ozempic. 4. Take all medications as prescribed.   CCM (Chronic Care Management) Team   Trish Fountain RN, BSN Nurse Care Coordinator  [phone]  [name] PharmD  Clinical Pharmacist  [phone]   Goals Addressed            This Visit's Progress   . "I need to stop eating honey buns" (pt-stated)       Nurse Case Manager Clinical Goal(s): Over the next 14 days patient will verbalize understanding of how sweets increases her blood sugars.- goal met 01/04/2019  Over the next 14 days patient will not skip meals and include protein with each meal.  Interventions: Discussed recent A1C. Praised patient for her hard work decreasing her A1C from 9 to 7.3. Discussed how simple sugars increase blood sugar. Provided emotional support and reassurance.   01/04/2019   Reviewed glucose readings.   Reinforced effects simple and complex carbs have on our blood sugars.   Provided support and encouragement to make smart food choices (glucerna shake instead of cake) when she is craving lots of sweets.  Encouraged patient to get back to the gym when she is able as exercise is a great way to control your blood sugars.   Encouraged patient to not skip meals and to include protein with each meal to prevent lows       The patient verbalized understanding of instructions provided today and declined a print copy of patient instruction materials.   Telephone follow  up appointment with CCM team member scheduled for: next week with Clinic Pharmacist

## 2019-01-04 NOTE — Chronic Care Management (AMB) (Signed)
  Chronic Care Management   Follow Up Note   01/04/2019 Name: Cindy Griffin MRN: [id number] DOB: [birth year]  Referred by: Cindy Sizer, MD Reason for referral : Chronic Care Management (follow up DM)   Subjective:    Objective:  Lab Results  Component Value Date   HGBA1C 7.3 (A) 10/31/2018   BP Readings from Last 3 Encounters:  12/25/18 125/81  11/28/18 136/72  10/31/18 136/72    Assessment: Cindy Griffin Rogersis a 73 y.o.year old femalewho sees Cindy Griffin, MDfor primary care. Cindy Griffin healthcare planasked the CCM team to consult the patient for assistance with chronic disease management. The CCM Teammet withMs. Rogersface to face 11/17/2019 to establish goals and discuss needed assistance with medication affordability. Today I follow up with Cindy Griffin to discuss her adherence to DM plan of care.   Goals Addressed    . "I need to stop eating honey buns" (pt-stated)       Cindy Griffin admits to having a "sweet tooth". She loves having something sweet after her evening meal. Her previous "go to" was Honey Buns". She states she has cut back on the Honey Buns and now has a Glucerna when she wants something sweet. She is learning to curb her cravings by finding other activities in the home to do. She is very social and eating out if a big part of her socialization. She is also helping to care for her daughter that recently had a stroke and this is causing Cindy Griffin some anxiety and worry. She is not going to the gym like she was. Her glucose reading are 130s-170s in the mornings as 70s-100 in the evenings. She admits to skipping meals especially in the morning. She is able to feel when her blood sugar is low and treats appropriately. Patient will continue to check her blood sugars as discussed and we will review at next encounter to evaluate lows and highs. She has picked up her new glucometer from the pharmacy but she will continue to use the "old' meter until she has utilized all  the strips she has.  Nurse Case Manager Clinical Goal(s): Over the next 14 days patient will verbalize understanding of how sweets increases her blood sugars.- goal met 01/04/2019 Over the next 14 days patient will not skip meals and include protein with each meal.  Interventions:  Discussed recent A1C. Praised patient for her hard work decreasing her A1C from 9 to 7.3. Discussed how simple sugars increase blood sugar. Provided emotional support and reassurance.   01/04/2019   Reviewed glucose readings.   Reinforced effects simple and complex carbs have on our blood sugars.   Provided support and encouragement to make smart food choices (glucerna shake instead of cake) when she is craving lots of sweets.  Encouraged patient to get back to the gym when she is able as exercise is a great way to control your blood sugars.   Encouraged patient to not skip meals and to include protein with each meal to prevent lows      Telephone follow up appointment with CCM team member scheduled for: CCM RN CM will follow up in 1 month    Cindy Griffin E. Cindy Rotunda, RN, BSN Nurse Care Coordinator [facility] Care Management  [phone]

## 2019-01-08 DIAGNOSIS — H6982 Other specified disorders of Eustachian tube, left ear: Secondary | ICD-10-CM | POA: Diagnosis not present

## 2019-01-08 DIAGNOSIS — J019 Acute sinusitis, unspecified: Secondary | ICD-10-CM | POA: Diagnosis not present

## 2019-01-08 DIAGNOSIS — H9202 Otalgia, left ear: Secondary | ICD-10-CM | POA: Diagnosis not present

## 2019-01-09 ENCOUNTER — Telehealth: Payer: Self-pay

## 2019-01-10 ENCOUNTER — Other Ambulatory Visit: Payer: Self-pay | Admitting: Family Medicine

## 2019-01-10 DIAGNOSIS — E785 Hyperlipidemia, unspecified: Secondary | ICD-10-CM

## 2019-01-10 DIAGNOSIS — I10 Essential (primary) hypertension: Secondary | ICD-10-CM

## 2019-01-10 DIAGNOSIS — N182 Chronic kidney disease, stage 2 (mild): Secondary | ICD-10-CM

## 2019-01-10 DIAGNOSIS — IMO0002 Reserved for concepts with insufficient information to code with codable children: Secondary | ICD-10-CM

## 2019-01-10 DIAGNOSIS — E1165 Type 2 diabetes mellitus with hyperglycemia: Principal | ICD-10-CM

## 2019-01-10 DIAGNOSIS — E1122 Type 2 diabetes mellitus with diabetic chronic kidney disease: Secondary | ICD-10-CM

## 2019-01-13 IMAGING — CT CT CHEST W/ CM
2 of 5 series · 13 of 36 positions shown, 16 images · IV contrast (APPLIED)
Comparison: CT scan of May 20, 2018.

CLINICAL DATA: Chest pain, shortness of breath.

EXAM:
CT CHEST, ABDOMEN, AND PELVIS WITH CONTRAST
TECHNIQUE: Multidetector CT imaging of the chest, abdomen and pelvis was
performed following the standard protocol during bolus
administration of intravenous contrast.
CONTRAST:  100mL UPSZDX-S22 IOPAMIDOL (UPSZDX-S22) INJECTION 61%

[Series 2: cap with · axial · 0.98mm/px · z∈[-591,-76]mm · 10 of 127 slices shown, 13 images]
[im 12/127  mediastinal]
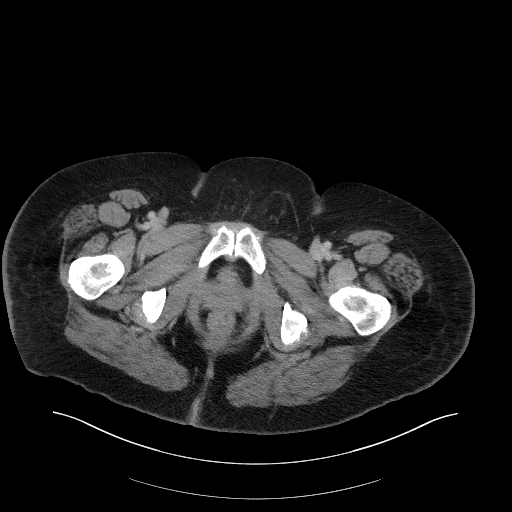
[im 12/127  lung]
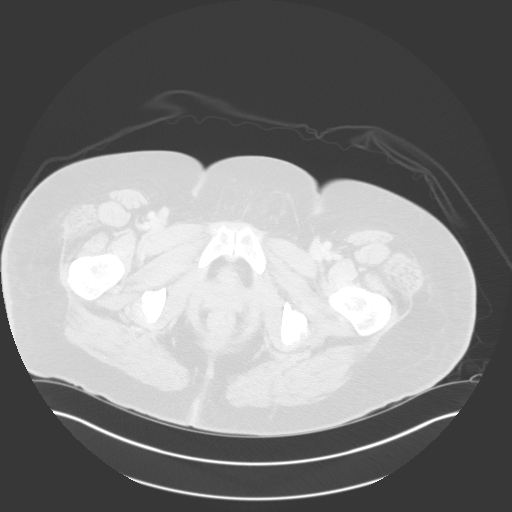
[im 23/127  lung]
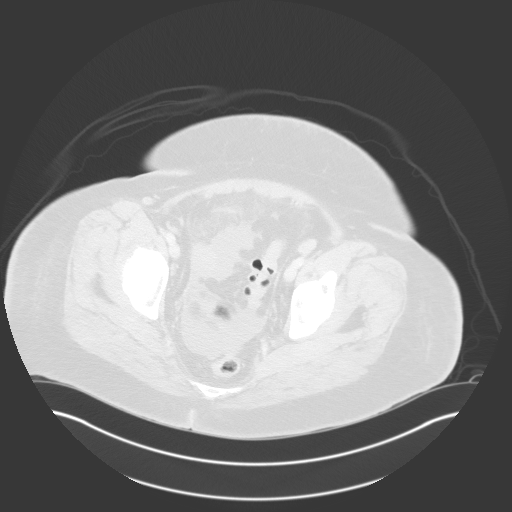
[im 35/127  lung]
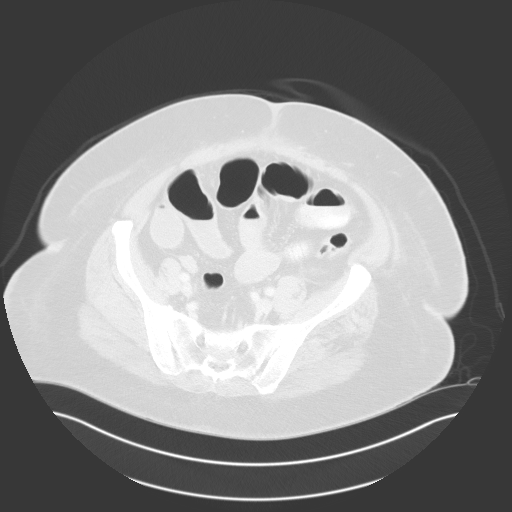
[im 46/127  lung]
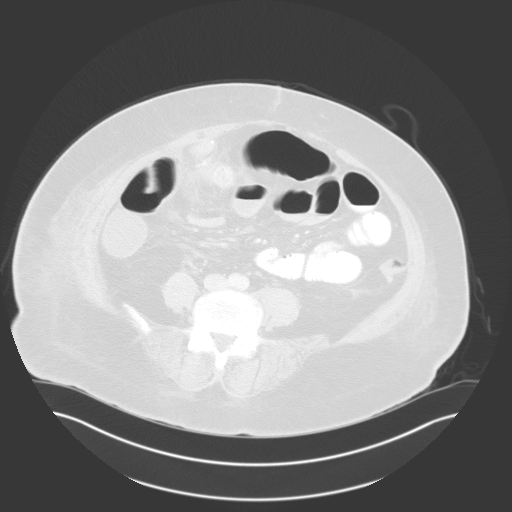
[im 58/127  mediastinal]
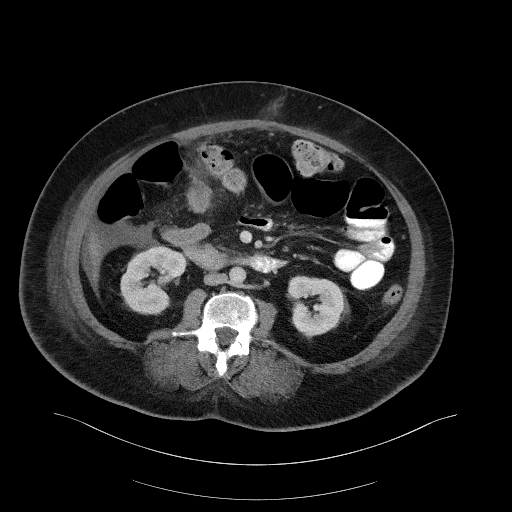
[im 58/127  lung]
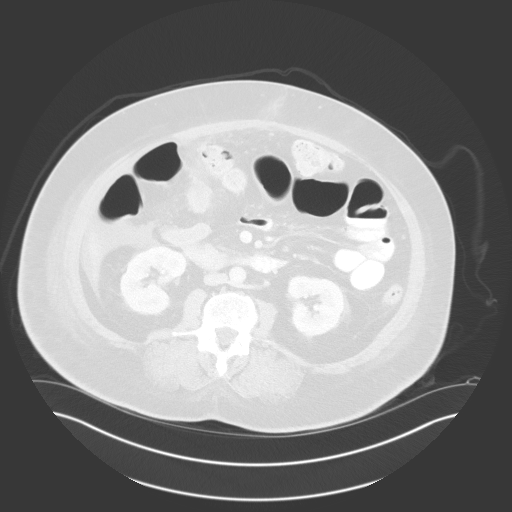
[im 69/127  lung]
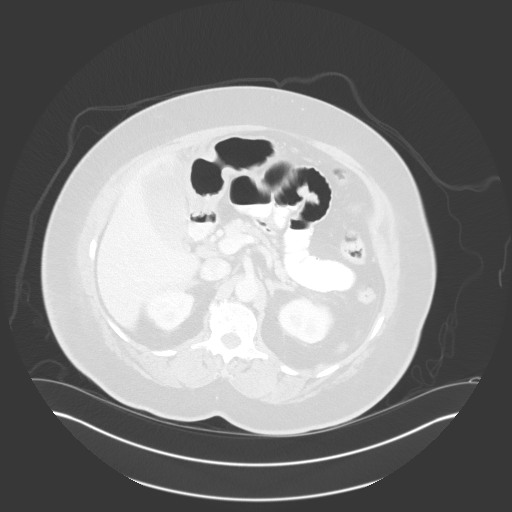
[im 81/127  lung]
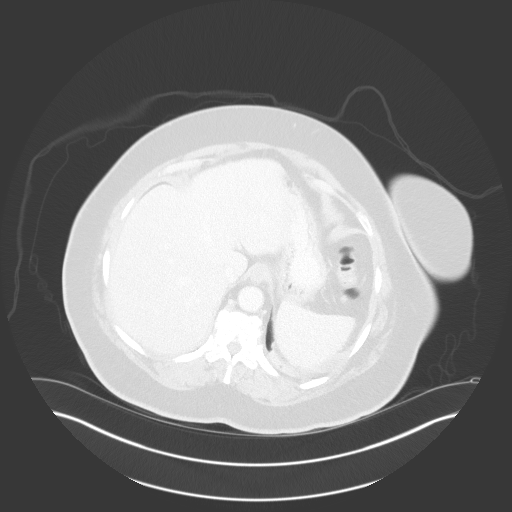
[im 92/127  lung]
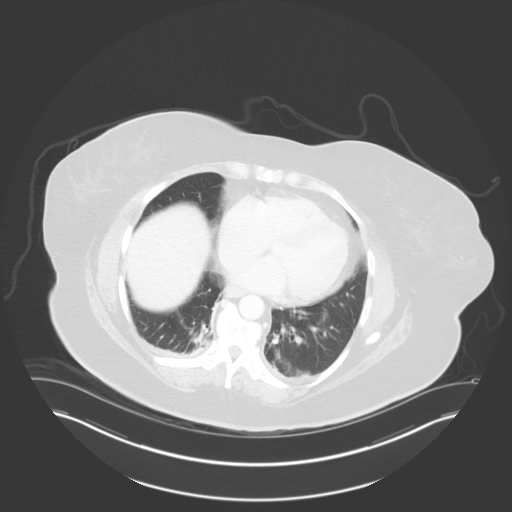
[im 104/127  mediastinal]
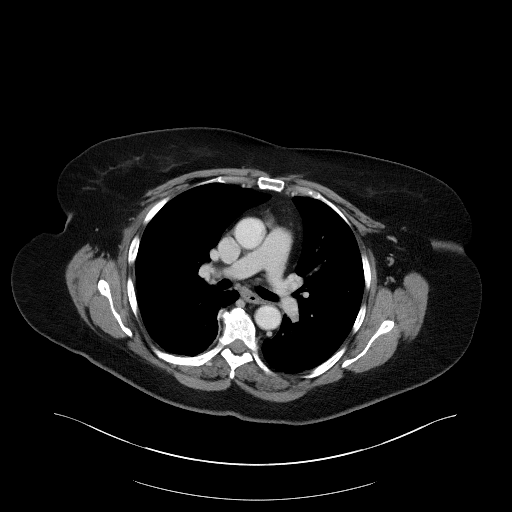
[im 104/127  lung]
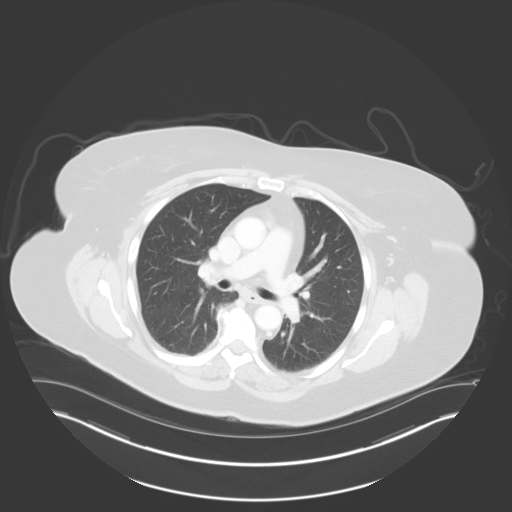
[im 115/127  lung]
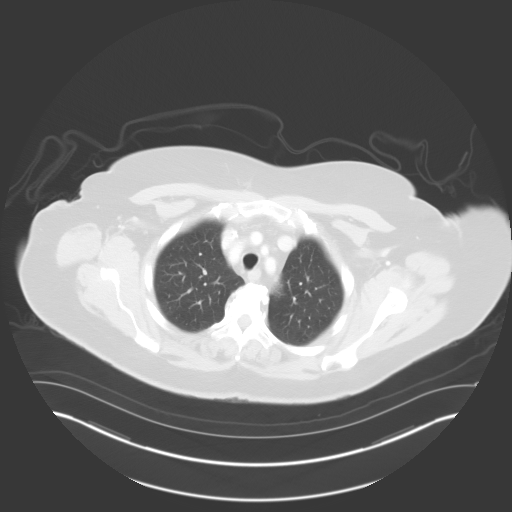

[Series 5: coronals · coronal · 0.82mm/px · 3 of 172 slices shown]
[im 35/172  lung]
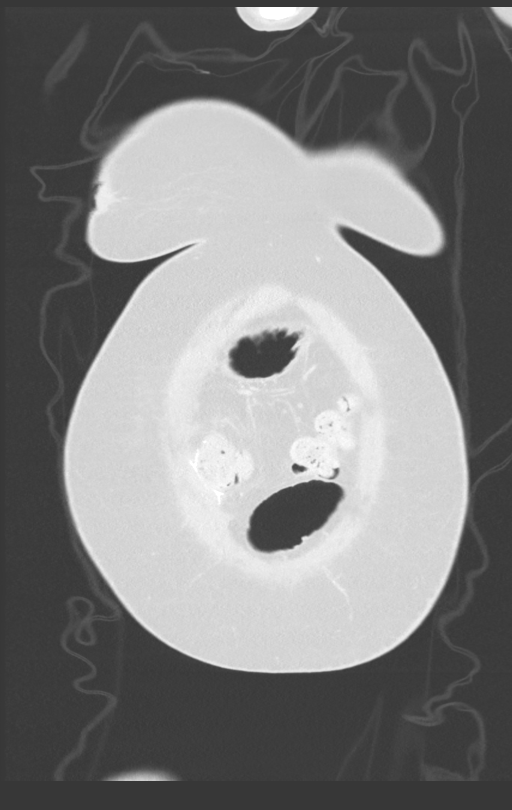
[im 69/172  lung]
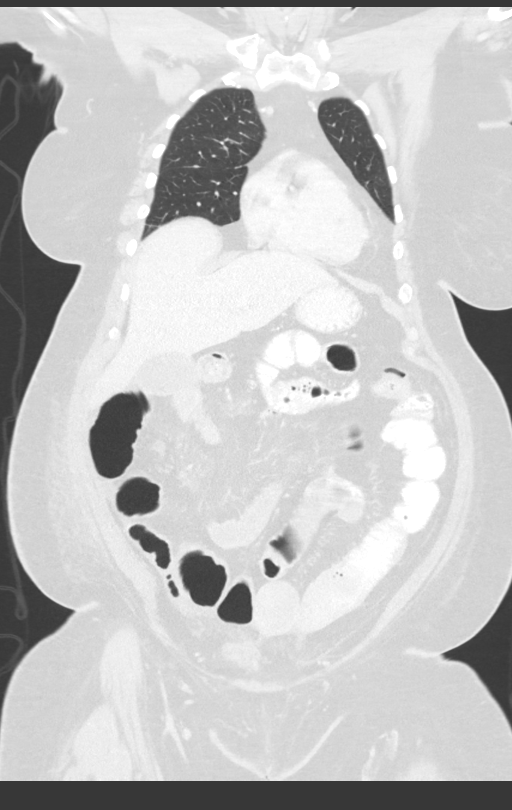
[im 103/172  lung]
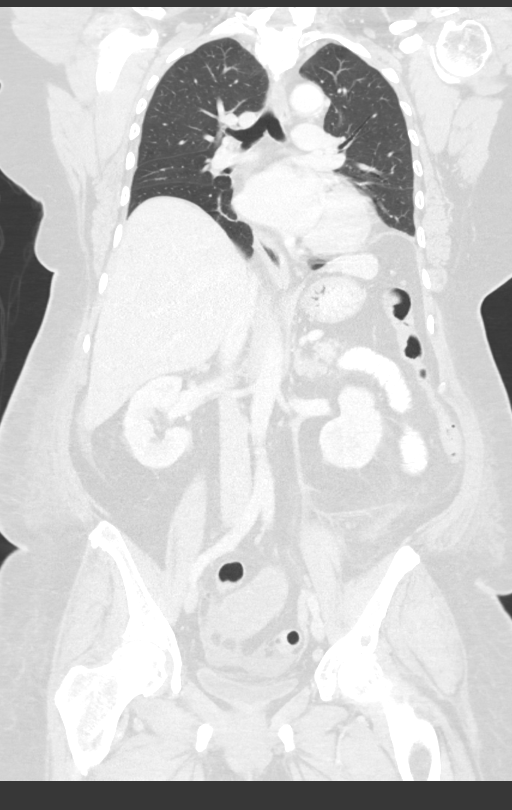

[13 of 36 positions shown; findings below may reference images not displayed]

FINDINGS: CT CHEST FINDINGS

Cardiovascular: No significant vascular findings. Normal heart size.
No pericardial effusion.

Mediastinum/Nodes: No enlarged mediastinal, hilar, or axillary lymph
nodes. Thyroid gland, trachea, and esophagus demonstrate no
significant findings.

Lungs/Pleura: No pneumothorax or pleural effusion is noted. Minimal
bilateral posterior basilar subsegmental atelectasis is noted.

Musculoskeletal: No chest wall mass or suspicious bone lesions
identified.

CT ABDOMEN PELVIS FINDINGS

Hepatobiliary: No focal liver abnormality is seen. No gallstones,
gallbladder wall thickening, or biliary dilatation.

Pancreas: Unremarkable. No pancreatic ductal dilatation or
surrounding inflammatory changes.

Spleen: Normal in size without focal abnormality.

Adrenals/Urinary Tract: Adrenal glands appear normal. Stable small
right renal cysts are noted. No hydronephrosis or renal obstruction
is noted. No renal or ureteral calculi are noted. Urinary bladder is
decompressed.

Stomach/Bowel: The stomach appears normal. Status post right
colectomy. Mild distal small bowel dilatation is noted concerning
for ileus or distal small bowel obstruction. This is mildly improved
compared to prior exam.

Vascular/Lymphatic: No significant vascular findings are present. No
enlarged abdominal or pelvic lymph nodes.

Reproductive: Status post hysterectomy. No adnexal masses.

Other: Small amount of free fluid is noted in the pelvis. No hernia
is noted.

Musculoskeletal: No acute or significant osseous findings.
IMPRESSION: No significant abnormality seen in the chest.

Status post right colectomy. Mild distal small bowel dilatation is
noted which may represent ileus or partial distal small bowel
obstruction. This is mildly improved compared to prior exam.

Small amount of ascites is noted in the pelvis.

## 2019-01-18 ENCOUNTER — Other Ambulatory Visit: Payer: Self-pay | Admitting: *Deleted

## 2019-01-29 ENCOUNTER — Ambulatory Visit: Payer: Self-pay

## 2019-01-29 DIAGNOSIS — J411 Mucopurulent chronic bronchitis: Secondary | ICD-10-CM

## 2019-01-29 DIAGNOSIS — IMO0002 Reserved for concepts with insufficient information to code with codable children: Secondary | ICD-10-CM

## 2019-01-29 DIAGNOSIS — E1129 Type 2 diabetes mellitus with other diabetic kidney complication: Secondary | ICD-10-CM

## 2019-01-29 DIAGNOSIS — E1165 Type 2 diabetes mellitus with hyperglycemia: Principal | ICD-10-CM

## 2019-01-29 NOTE — Chronic Care Management (AMB) (Signed)
  Chronic Care Management   Follow Up Note   01/29/2019 Name: Cindy Griffin MRN: [id number] DOB: 08/23/1946  Referred by: Cindy Sizer, MD Reason for referral : Chronic Care Management (needs ozempic)   Subjective: "I don't have any Ozempic and I need a sample"   Objective:  Lab Results  Component Value Date   HGBA1C 7.3 (A) 10/31/2018    Assessment: Cindy Hadaway Rogersis a 73 y.o.year old femalewho sees Cindy Griffin, MDfor primary care. Cindy Griffin healthcare planasked the CCM team to consult the patient for assistance with chronic disease management. The CCM Teammet withMs. Rogersface to face 11/17/2019 to establish goals and discuss needed assistance with medication affordability. Today CCM RN CM received an incoming call from Cindy Griffin requesting samples for Ozempic. Of note, Cindy Griffin has been offered assistance from Cindy Griffin with applying for financial assistance but repeatedly declined. Today she was informed that samples were limited and she needs to apply for assistance in order to insure she would receive her medications.  CCM RN CM also discussed diabetes goals during this call.  Goals Addressed            This Visit's Progress   . COMPLETED: "I need to stop eating honey buns" (pt-stated)       Nurse Case Manager Clinical Goal(s): Over the next 14 days patient will verbalize understanding of how sweets increases her blood sugars.-goal met 01/04/2019 Over the next 14 days patient will not skip meals and include protein with each meal.-goal met 01/29/2019  Interventions:  Discussed recent A1C. Praised patient for her hard work decreasing her A1C from 9 to 7.3.  Discussed how simple sugars increase blood sugar. Provided emotional support and reassurance.   01/04/2019  Reviewed glucose readings.   Reinforced effects simple and complex carbs have on our blood sugars.   Provided support and encouragement to make smart food choices (glucerna shake instead  of cake) when she is craving lots of sweets.  Encouraged patient to get back to the gym when she is able as exercise is a great way to control your blood sugars.  Encouraged patient to not skip meals and to include protein with each meal to prevent lows      Plan: Sample of Ozempic left for patient at front desk. Patient notified.  Cindy Griffin has met all care management goals. Review of patient status, including review of consultants reports, relevant laboratory and other test results, and collaboration with appropriate care team members and the patient's provider was performed as part of comprehensive patient evaluation and provision of chronic care management services.  The care management team is available to Cindy Griffin at any time to assist with care management needs should they arise. Ms. has been given contact information and instructions to contact the care management team with any questions or should new care management needs arise.      Cindy Griffin E. Rollene Rotunda, RN, BSN Nurse Care Coordinator [facility] / [facility] Care Management  [phone]

## 2019-01-30 NOTE — Patient Instructions (Signed)
  Thank you allowing the Chronic Care Management Team to be a part of your care! It was a pleasure speaking with you today!  1. Please stop by the office today to pick up your sample of Ozempic 2. You need to complete paperwork for medication assistance previously provided to you and bring by the office to [facility] Pharmacist 3. Continue to monitor your blood sugars as directed by your provider 4. Take all medications as prescribed 5. Limit your intake of simple sugars including cakes, candies, sugary beverages  CCM (Chronic Care Management) Team   Trish Fountain RN, BSN Nurse Care Coordinator  [phone]  [name] PharmD  Clinical Pharmacist  [phone]   Goals Addressed            This Visit's Progress   . COMPLETED: "I need to stop eating honey buns" (pt-stated)       Nurse Case Manager Clinical Goal(s): Over the next 14 days patient will verbalize understanding of how sweets increases her blood sugars.-goal met 01/04/2019 Over the next 14 days patient will not skip meals and include protein with each meal.-goal met 01/29/2019  Interventions:  Discussed recent A1C. Praised patient for her hard work decreasing her A1C from 9 to 7.3.  Discussed how simple sugars increase blood sugar. Provided emotional support and reassurance.   01/04/2019  Reviewed glucose readings.   Reinforced effects simple and complex carbs have on our blood sugars.   Provided support and encouragement to make smart food choices (glucerna shake instead of cake) when she is craving lots of sweets.  Encouraged patient to get back to the gym when she is able as exercise is a great way to control your blood sugars.  Encouraged patient to not skip meals and to include protein with each meal to prevent lows       Congratulations! You have met all case management goals! You may call the case management team at any time should you have a question or if you have new case management needs. We  are happy to help you! We will let your doctor know that you have met your goals.

## 2019-02-01 ENCOUNTER — Telehealth: Payer: Self-pay

## 2019-02-05 DIAGNOSIS — R970 Elevated carcinoembryonic antigen [CEA]: Secondary | ICD-10-CM | POA: Diagnosis not present

## 2019-02-08 ENCOUNTER — Other Ambulatory Visit: Payer: Self-pay

## 2019-02-08 ENCOUNTER — Ambulatory Visit: Payer: PPO

## 2019-02-08 DIAGNOSIS — IMO0002 Reserved for concepts with insufficient information to code with codable children: Secondary | ICD-10-CM

## 2019-02-08 DIAGNOSIS — I10 Essential (primary) hypertension: Secondary | ICD-10-CM

## 2019-02-08 DIAGNOSIS — E1129 Type 2 diabetes mellitus with other diabetic kidney complication: Secondary | ICD-10-CM

## 2019-02-08 DIAGNOSIS — E1165 Type 2 diabetes mellitus with hyperglycemia: Principal | ICD-10-CM

## 2019-02-08 NOTE — Patient Instructions (Signed)
1. Please bring your glucometer and instructions with you to your visit on 02/13/2019 at 9:00 2. Call CCM team directly if you need to cancel or change your appointment.  CCM (Chronic Care Management) Team   Trish Fountain RN, BSN Nurse Care Coordinator  [phone]  [name] PharmD  Clinical Pharmacist  [phone]  Elliot Gurney, [place] Clinical Social Worker [phone]

## 2019-02-08 NOTE — Chronic Care Management (AMB) (Signed)
  Chronic Care Management   Note  02/08/2019 Name: Cindy Griffin MRN: [id number] DOB: [birth year]  Care Coordination: CCM RN CM returned patients left VM message requesting an office visit with CCM RN CM to review her new glucometer. Successful telephone encounter to patient reveals patient feels new meter is giving her incorrect readings and error codes. CCM RN CM is happy to schedule follow up appointment for Tuesday 02/13/2019 at 9:00   Face to Face appointment with CCM team member scheduled for: see above    Roper Tolson E. Rollene Rotunda, RN, BSN Nurse Care Coordinator [facility] / [facility] Care Management  [phone]

## 2019-02-13 ENCOUNTER — Ambulatory Visit: Payer: Self-pay

## 2019-02-13 DIAGNOSIS — E1129 Type 2 diabetes mellitus with other diabetic kidney complication: Secondary | ICD-10-CM

## 2019-02-13 DIAGNOSIS — E1165 Type 2 diabetes mellitus with hyperglycemia: Principal | ICD-10-CM

## 2019-02-13 DIAGNOSIS — IMO0002 Reserved for concepts with insufficient information to code with codable children: Secondary | ICD-10-CM

## 2019-02-13 DIAGNOSIS — I10 Essential (primary) hypertension: Secondary | ICD-10-CM

## 2019-02-13 NOTE — Chronic Care Management (AMB) (Signed)
  Chronic Care Management   Note  02/13/2019 Name: Cindy Griffin MRN: [id number] DOB: 1946-02-27  Care Coordination: Unsuccessful telephone call to [name] who presented to her scheduled CCM RN CM follow up visit at her PCP practice this morning. Unfortunately, Ms. [name] was not notified by CCM RN CM that the CCM Team is working remotely secondary to the COVID-19 pandemic. Ms. [name] was informed by the office staff and left the practice. CCM RN CM attempting to follow up with Ms. [name] to assess needs and provide care/assistance remotely.   HIPAA compliant VM message left on Ms. [name] home phone requesting call back. Unable to contact patient via her cell as VM has not been set up.  Telephone follow up appointment with CCM team member scheduled for: The CM team will reach out to the patient again over the next 7 days.    Rosabella Edgin E. Rollene Rotunda, RN, BSN Nurse Care Coordinator [facility] / [facility] Care Management  [phone]

## 2019-02-14 ENCOUNTER — Other Ambulatory Visit: Payer: Self-pay | Admitting: Family Medicine

## 2019-02-14 DIAGNOSIS — I10 Essential (primary) hypertension: Secondary | ICD-10-CM

## 2019-02-14 DIAGNOSIS — E1122 Type 2 diabetes mellitus with diabetic chronic kidney disease: Secondary | ICD-10-CM

## 2019-02-14 DIAGNOSIS — IMO0002 Reserved for concepts with insufficient information to code with codable children: Secondary | ICD-10-CM

## 2019-02-14 DIAGNOSIS — E1165 Type 2 diabetes mellitus with hyperglycemia: Secondary | ICD-10-CM

## 2019-02-15 ENCOUNTER — Ambulatory Visit: Payer: Self-pay

## 2019-02-15 DIAGNOSIS — IMO0002 Reserved for concepts with insufficient information to code with codable children: Secondary | ICD-10-CM

## 2019-02-15 DIAGNOSIS — E1165 Type 2 diabetes mellitus with hyperglycemia: Secondary | ICD-10-CM

## 2019-02-15 DIAGNOSIS — E1129 Type 2 diabetes mellitus with other diabetic kidney complication: Secondary | ICD-10-CM

## 2019-02-15 DIAGNOSIS — I10 Essential (primary) hypertension: Secondary | ICD-10-CM

## 2019-02-15 NOTE — Chronic Care Management (AMB) (Signed)
  Chronic Care Management   Note  02/15/2019 Name: [name] MRN: [id number] DOB: March 23, 1946  Care Coordination:  Incoming call from [name] in response to left message by CCM RN CM. Ms. Mckenney needs assistance with glucometer set up. She is also requesting medication assistance for Ozempic. She has previously refused to complete application and relied on samples from PCP. CCM RN CM has discussed with CCM Pharmacist who will call Ms. Novack within the next 4 days.   Ms Britz states she is not at home nor will she return home intil after 5:30 days and does not wish to discuss glucometer until tomorrow. Ms. Bivens states she will call CCM RN CM back tomorrow when glucometer is available to trouble shoot the error code.   Plan: CCM RN CM will await call back from patient.    Thao Vanover E. Rollene Rotunda, RN, BSN Nurse Care Coordinator [facility] / [facility] Care Management  [phone]

## 2019-02-16 ENCOUNTER — Ambulatory Visit: Payer: Self-pay | Admitting: Pharmacist

## 2019-02-16 DIAGNOSIS — IMO0002 Reserved for concepts with insufficient information to code with codable children: Secondary | ICD-10-CM

## 2019-02-16 DIAGNOSIS — E1165 Type 2 diabetes mellitus with hyperglycemia: Principal | ICD-10-CM

## 2019-02-16 DIAGNOSIS — E1129 Type 2 diabetes mellitus with other diabetic kidney complication: Secondary | ICD-10-CM

## 2019-02-16 NOTE — Chronic Care Management (AMB) (Signed)
  Chronic Care Management   Note  02/16/2019 Name: Cindy Griffin MRN: [id number] DOB: [birth year]  73 y.o. year old female referred to Chronic Care Management by Dr. Steele Sizer for medication management and diabetes education. Last office visit with Steele Sizer, MD was 10/31/18. [name] was previously engaged with CCM pharmacist for medication assistance but declined to complete medication applications so CCM pharmacist completed care goals and ceased outreach. Per voicemail left with CCM pharmacist after 5pm on Wednesday, [name] would now like to apply to medication assistance programs and was wondering how to do so.    Was unable to reach patient via telephone today and have left HIPAA compliant voicemail asking patient to return my call. (unsuccessful outreach #1).  [name], PharmD Clinical Pharmacist [facility] [phone]

## 2019-02-20 ENCOUNTER — Ambulatory Visit: Payer: PPO

## 2019-02-20 ENCOUNTER — Ambulatory Visit (INDEPENDENT_AMBULATORY_CARE_PROVIDER_SITE_OTHER): Payer: PPO | Admitting: Pharmacist

## 2019-02-20 ENCOUNTER — Other Ambulatory Visit: Payer: Self-pay

## 2019-02-20 DIAGNOSIS — E1129 Type 2 diabetes mellitus with other diabetic kidney complication: Secondary | ICD-10-CM

## 2019-02-20 DIAGNOSIS — I10 Essential (primary) hypertension: Secondary | ICD-10-CM

## 2019-02-20 DIAGNOSIS — J411 Mucopurulent chronic bronchitis: Secondary | ICD-10-CM | POA: Diagnosis not present

## 2019-02-20 DIAGNOSIS — E1165 Type 2 diabetes mellitus with hyperglycemia: Principal | ICD-10-CM

## 2019-02-20 DIAGNOSIS — IMO0002 Reserved for concepts with insufficient information to code with codable children: Secondary | ICD-10-CM

## 2019-02-20 NOTE — Chronic Care Management (AMB) (Signed)
Chronic Care Management   Follow Up Note   02/20/2019 Name: Cindy Griffin MRN: [id number] DOB: [birth year]  Referred by: Steele Sizer, MD Reason for referral : Chronic Care Management (follow up on DM/glucometer use)   Cindy Griffin is a 73 y.o. year old female who is a primary care patient of Steele Sizer, MD. The CCM team was consulted for assistance with chronic disease management and care coordination needs.    Review of patient status, including review of consultants reports, relevant laboratory and other test results, and collaboration with appropriate care team members and the patient's provider was performed as part of comprehensive patient evaluation and provision of chronic care management services.    Goals Addressed            This Visit's Progress    "I need as much assistance with my medication as I can" (pt-stated)       Today CCM RN CM followed up with Cindy Griffin to assist with trouble shooting her new glucometer. Cindy Griffin states although the meter and strips are free with [employer], she prefers to use her old meter and has purchased 50 strips for 9.00. She reports today's fasting CBG was 165, secondary to eating corn bread last evening. She did report using restraint and eating only 2 small pieces which is major success for her. She is checking her BP several times weekly and reports today's reading as 128/81 immediately after being up in her kitchen fixing coffee. She is taking all medications as prescribed and following previously provided education on DM and HTN self care. She is concerned that she will not qualify for assistance with Anoro and Ozempic as she has not spent a lot out of pocket for her medications this year. She was previously provided forms to complete for medication assistance however did not follow through and relies on samples from the office. Today, she will engage with [facility] Pharmacist to begin process for medication assistance. She reports she is  using her last inhaler and only has 1 weeks worth of Ozempic at this time.     Clinic Pharmacy Clinical Goal(s): (updated)  Over the next 30 days, CindyStann Mainland will provide the necessary supplementary documents (proof of out of pocket prescription expenditure, proof of household income) needed for medication assistance applications to CCM pharmacist.   Interventions:  CCM pharmacist will apply for medication assistance program for Anoro Ellipta made by [place], for Ozempic made by [employer], and Proventil made by [employer]  Current Barriers:   Film/video editor.   Difficulty obtaining medications  Nurse Case Manager Clinical Goal(s):   Over the next 14 days, patient will work with [facility] Pharmacist to address needs related to medication affordibility specifically Anora and Ozempic  Interventions:   Advised patient to complete previously provided application for med assist and to gather previously discussed documents needed to assist pharmacist with application  Patient Self Care Activities:   Gather needed documents  Engage with [facility] Pharmacist  Initial goal documentation         Telephone follow up appointment with CCM team member scheduled for: today with CCM Clinic Pharmacist  Next PCP appointment scheduled for: 03/02/2019  AWV visit scheduled for: 02/27/2019  No further follow up required: by CCM RN CM as patient has recieved all education needed to successfully self manage DM and HTN and has met disease management goals. Patient will continue to be engaged by [facility] Pharmacist and RN CM will be available to  patient if additional needs arise. Patient has been provided CCM RN CM contact information.     Cindy Griffin E. Rollene Rotunda, RN, BSN Nurse Care Coordinator [facility] / [facility] Care Management  [phone]

## 2019-02-20 NOTE — Patient Instructions (Signed)
Goals Addressed            This Visit's Progress   . "I need as much assistance with my medication as I can" (pt-stated)       Clinic Pharmacy Clinical Goal(s): (updated)  Over the next 30 days, Cindy Griffin will provide the necessary supplementary documents (proof of out of pocket prescription expenditure, proof of household income) needed for medication assistance applications to CCM pharmacist.   Interventions:  CCM pharmacist will apply for medication assistance program for Anoro Ellipta made by [place], for Ozempic made by [employer], and Proventil made by [employer]   Interventions:  . CCM pharmacist will collaborate with [employer] to obtain OOP expenditure  . CCM pharmacist will facilitate application submission for Anoro Ellipta, Ozempic, and Proventil  Patient Self Care Activities:  [name] Gather needed documents and drop off at [facility] office   *reactivated goal  Current Barriers:  . Film/video editor.  . Difficulty obtaining medications  Nurse Case Manager Clinical Goal(s):  [name] Over the next 14 days, patient will work with [facility] Pharmacist to address needs related to medication affordibility specifically Anora and Ozempic  Interventions:  . Advised patient to complete previously provided application for med assist and to gather previously discussed documents needed to assist pharmacist with application  . Provided emotional support and reassurance related to current pandemic of COVID-19 . Discussed current CDC recommendations for social distancing and hand hygiene.  .   Patient Self Care Activities:  [name] Gather needed documents . Engage with [facility] Pharmacist - completed 02/20/19 . Follow CDC COVID-19 guidelines including symptom reporting, social distancing, and hand hygiene  Initial goal documentation         The patient verbalized understanding of instructions provided today and declined a print copy of patient instruction materials.

## 2019-02-20 NOTE — Patient Instructions (Addendum)
Thank you allowing the Chronic Care Management Team to be a part of your care! It was a pleasure speaking with you today!  1. Continue to check your blood sugar readings and record. Please bring to your next appointment with Dr. Ancil Boozer 03/02/2019 2. Continue to follow the ADA diet as discussed and eat carbohydrates in moderations and adhere to portion size 3. Continue to check your blood pressure 3 x week and report readings to Dr. Ancil Boozer 4. Take all medications as prescribed 5. Engage with [facility] Pharmacist today 6. Provide all requested paperwork need to apply for medication assistance 7. Contact the CCM RN CM with any additional needs 8. Follow the CDC guidelines for COVID-19 symptom reporting, social distancing, and hand hygiene  Handwashing is one of the best ways to protect yourself and your family from getting sick. Wash Your Hands Often to Stay Healthy  When: You can help yourself and your loved ones stay healthy by washing your hands often, especially during these key times when you are likely to get and spread germs: . Before, during, and after preparing food  . Before eating food  . Before and after caring for someone at home who is sick with vomiting or diarrhea  . Before and after treating a cut or wound  . After using the toilet  . After being in public spaces . After changing diapers or cleaning up a child who has used the toilet  . After blowing your nose, coughing, or sneezing  . After touching an animal, animal feed, or animal waste  . After handling pet food or pet treats  . After touching garbage . After touching public doors, telephones, shopping carts, or other surfaces .  Five Steps to [place] the Right Way 1. Wet your hands with clean, running water (warm or cold), turn off the tap, and apply soap. 2. Lather your hands by rubbing them together with the soap. Lather the backs of your hands, between your fingers, and under your nails. 3. Scrub your  hands for at least 20 seconds. Need a timer? Hum the "Happy Birthday" song from beginning to end twice. 4. Rinse your hands well under clean, running water. 5. Dry your hands using a clean towel or air dry them.  Source: BridgeDigest.is  For information about COVID-19 or "Corona Virus", the following web resources may be helpful:  CDC: BeginnerSteps.be    [place]:  InsuranceIntern.se  CCM (Chronic Care Management) Team   Trish Fountain RN, BSN Nurse Care Coordinator  [phone]  [name] PharmD  Clinical Pharmacist  [phone]   [place], [place] Social Worker [phone]  Goals Addressed            This Visit's Progress   . "I need as much assistance with my medication as I can" (pt-stated)       Clinic Pharmacy Clinical Goal(s): (updated)  Over the next 30 days, Cindy Griffin will provide the necessary supplementary documents (proof of out of pocket prescription expenditure, proof of household income) needed for medication assistance applications to CCM pharmacist.   Interventions:  CCM pharmacist will apply for medication assistance program for Anoro Ellipta made by [place], for Ozempic made by [employer], and Proventil made by [employer]  Current Barriers:  . Film/video editor.  . Difficulty obtaining medications  Nurse Case Manager Clinical Goal(s):  Cindy Griffin Over the next 14 days, patient will work with [facility] Pharmacist to address needs related to medication affordibility specifically  Anora and Ozempic  Interventions:  . Advised patient to complete previously provided application for med assist and to gather previously discussed documents needed to assist pharmacist with application  . Provided emotional support and reassurance related to current pandemic of  COVID-19 . Discussed current CDC recommendations for social distancing and hand hygiene.  .   Patient Self Care Activities:  Cindy Griffin Gather needed documents . Engage with [facility] Pharmacist . Follow CDC COVID-19 guidelines including symptom reporting, social distancing, and hand hygiene  Initial goal documentation         The patient verbalized understanding of instructions provided today and declined a print copy of patient instruction materials.   Telephone follow up appointment with CCM team member scheduled for: Clinic Pharmacist today Next PCP appointment scheduled for: 03/02/2019 Next AWV (Annual Wellness Visit) scheduled for: 02/27/2019

## 2019-02-20 NOTE — Chronic Care Management (AMB) (Signed)
  Chronic Care Management   Follow Up Note   02/20/2019 Name: LETANYA FROH MRN: [id number] DOB: 12-21-1945  Referred by: Steele Sizer, MD Reason for referral : Chronic Care Management (Medication assistance)   SHAKETTA RILL is a 73 y.o. year old female who is a primary care patient of Steele Sizer, MD. The CCM team was consulted for assistance with chronic disease management and care coordination needs.    Review of patient status, including review of consultants reports, relevant laboratory and other test results, and collaboration with appropriate care team members and the patient's provider was performed as part of comprehensive patient evaluation and provision of chronic care management services.    Assessment: Mrs. Rudden is previously known to CCM pharmacist but declined pursuing medication assistance. Speaking with Trish Fountain, RNCM on 3/19, patient would now like to pursue medication assistance. Successful outreach to patient today, HIPAA identifiers verified.  Reviewed medication assistance applications provided to Mrs. Saxe at her initial CCM office visit. Necessary paperwork includes completed application, copy of Social Security benefit letter, and OOP expense (obtained from [employer]).   Mrs. Betley repeatedly raised her voice with CCM pharmacist, getting agitated after CCM pharmacist offered to help, stating "I already told that nurse what I need". Mrs. Kistner then directed CCM pharmacist to find Ozempic samples for her. CCM pharmacist attempted to explain that drug reps were not allowed into the practice at this time, but Mrs. Boven again raised her voice and cut off the pharmacist from speaking.    Goals Addressed            This Visit's Progress   . "I need as much assistance with my medication as I can" (pt-stated)       Clinic Pharmacy Clinical Goal(s): (updated)  Over the next 30 days, [name] will provide the necessary supplementary documents (proof of out  of pocket prescription expenditure, proof of household income) needed for medication assistance applications to CCM pharmacist.   Interventions:  CCM pharmacist will apply for medication assistance program for Anoro Ellipta made by [employer], for Ozempic made by [employer], and Proventil made by [employer]   Interventions:  . CCM pharmacist will collaborate with [employer] to obtain OOP expenditure  . CCM pharmacist will facilitate application submission for Anoro Ellipta, Ozempic, and Proventil  Patient Self Care Activities:  [name] Gather needed documents and drop off at [facility] office   *reactivated goal  Current Barriers:  . Film/video editor.  . Difficulty obtaining medications  Nurse Case Manager Clinical Goal(s):  [name] Over the next 14 days, patient will work with [facility] Pharmacist to address needs related to medication affordibility specifically Anora and Ozempic  Interventions:  . Advised patient to complete previously provided application for med assist and to gather previously discussed documents needed to assist pharmacist with application  . Provided emotional support and reassurance related to current pandemic of COVID-19 . Discussed current CDC recommendations for social distancing and hand hygiene.  .   Patient Self Care Activities:  [name] Gather needed documents . Engage with [facility] Pharmacist - completed 02/20/19 . Follow CDC COVID-19 guidelines including symptom reporting, social distancing, and hand hygiene  Initial goal documentation          Telephone follow up appointment with CCM team member scheduled for: 2 weeks with PharmD for med assistance follow up   [name], PharmD Clinical Pharmacist [place] [phone]

## 2019-02-28 ENCOUNTER — Other Ambulatory Visit: Payer: Self-pay | Admitting: Family Medicine

## 2019-02-28 DIAGNOSIS — IMO0002 Reserved for concepts with insufficient information to code with codable children: Secondary | ICD-10-CM

## 2019-02-28 DIAGNOSIS — E1165 Type 2 diabetes mellitus with hyperglycemia: Principal | ICD-10-CM

## 2019-02-28 DIAGNOSIS — E1122 Type 2 diabetes mellitus with diabetic chronic kidney disease: Secondary | ICD-10-CM

## 2019-03-01 ENCOUNTER — Ambulatory Visit (INDEPENDENT_AMBULATORY_CARE_PROVIDER_SITE_OTHER): Payer: PPO | Admitting: Family Medicine

## 2019-03-01 ENCOUNTER — Other Ambulatory Visit: Payer: Self-pay

## 2019-03-01 ENCOUNTER — Encounter: Payer: Self-pay | Admitting: Family Medicine

## 2019-03-01 VITALS — BP 118/68 | HR 90 | Temp 98.3°F | Resp 16 | Ht 64.0 in | Wt 231.6 lb

## 2019-03-01 DIAGNOSIS — E1169 Type 2 diabetes mellitus with other specified complication: Secondary | ICD-10-CM | POA: Diagnosis not present

## 2019-03-01 DIAGNOSIS — F330 Major depressive disorder, recurrent, mild: Secondary | ICD-10-CM | POA: Diagnosis not present

## 2019-03-01 DIAGNOSIS — E669 Obesity, unspecified: Secondary | ICD-10-CM | POA: Diagnosis not present

## 2019-03-01 DIAGNOSIS — D473 Essential (hemorrhagic) thrombocythemia: Secondary | ICD-10-CM

## 2019-03-01 DIAGNOSIS — M25462 Effusion, left knee: Secondary | ICD-10-CM | POA: Diagnosis not present

## 2019-03-01 DIAGNOSIS — J411 Mucopurulent chronic bronchitis: Secondary | ICD-10-CM

## 2019-03-01 DIAGNOSIS — M25562 Pain in left knee: Secondary | ICD-10-CM | POA: Diagnosis not present

## 2019-03-01 DIAGNOSIS — I10 Essential (primary) hypertension: Secondary | ICD-10-CM

## 2019-03-01 DIAGNOSIS — E1121 Type 2 diabetes mellitus with diabetic nephropathy: Secondary | ICD-10-CM

## 2019-03-01 LAB — POCT GLYCOSYLATED HEMOGLOBIN (HGB A1C): HbA1c, POC (controlled diabetic range): 6.9 % (ref 0.0–7.0)

## 2019-03-01 MED ORDER — HYDROCHLOROTHIAZIDE 25 MG PO TABS: 25.0000 mg | ORAL_TABLET | Freq: Every day | ORAL | 1 refills | Status: DC

## 2019-03-01 MED ORDER — GLIPIZIDE ER 10 MG PO TB24: 10.0000 mg | ORAL_TABLET | Freq: Every day | ORAL | 1 refills | Status: DC

## 2019-03-01 MED ORDER — METFORMIN HCL ER 750 MG PO TB24: 1500.0000 mg | ORAL_TABLET | Freq: Every day | ORAL | 1 refills | Status: DC

## 2019-03-01 MED ORDER — LISINOPRIL 40 MG PO TABS: 40.0000 mg | ORAL_TABLET | Freq: Every day | ORAL | 1 refills | Status: DC

## 2019-03-01 MED ORDER — PIOGLITAZONE HCL 15 MG PO TABS: 15.0000 mg | ORAL_TABLET | Freq: Every day | ORAL | 1 refills | Status: DC

## 2019-03-01 NOTE — Progress Notes (Signed)
Name: Cindy Griffin   MRN: [id number]    DOB: Nov 17, 1946   Date:03/01/2019       Progress Note  Subjective  Chief Complaint  Chief Complaint  Patient presents with  . Medication Refill  . Diabetes  . Hypertension  . Hyperlipidemia  . Insomnia  . Gastroesophageal Reflux    HPI   DMII with nephropathy: she is cannot afford brand medications. We tried Synjardi and Victoza but had to stop because of cost. She is currently on Glipizide XL10mg  and Metformin 1500 mg daily, Actos 15 mg, and glucose is trending up. We changed to [place] in Dec 2018 she is tolerating medication well since.HbgA1C from 8.1% to 8.5%,9.0%8.0%  7.9%, 7.3% and today it was 6.9% , she states she was eating honey buns but stopped it 2-3 months ago and A1C is at goal    She denies polyphagia, polydipsia or polyuria. She also has gastroparesis and is now on reglan. She is on ACE  for kidney protection   HTN: taking medication. No chest pain or palpitation, bp is at goal today, no dizziness. She has intermittent episodes of vertigo   History of colon cancer: very early recesction pT1N0( 0/21 lymphonod) , . She had a CEA that was 7 back in December and had it repeated recently but I cannot see results, advised her to call Dr. Dema Severin  . Dr. Vicente Males did last colonoscopy 11/2018 that showed to small polyps   Hyperlipidemia: taking Atorvastatin and denies side effects of medications.Reviewed labs with patient and is doing well.   Cervical and lumbar radiculitis/OA: seeing Dr. Phyllis Ginger, had a Left L4-5 transforaminal epidural injection, she also had PT, and on 07/01/2017 she had a left C3-4 transforaminal epidural steroid injection under fluoroscopic guidance. Had left shoulder surgery, tingling on her hands has improved, and neck painis stable also.She also has post-traumatic leftknee OA , she stopped  Celebrex because of costshe is still under the care of Dr. Phyllis Ginger   Insomnia: she has difficulty falling and  staying asleep, she has not been taking trazodone because she does not want to depend on medication . Undone   Morbid Obesity: she tried Contrave but made her vomit. We started her on Belvig in April 2017 and she lost weight, she was down 22 lbs by June when she had shoulder surgery, but she stopped exercising and taking medication and hadgained 12lbs post-surgery, but stopped Belviq because of cost. Weight was down to 227 lbs but has gained 4 lbs since last visit. She is on Ozempic, however depending on samples, explained that we cannot guarantee having samples for her  Chronic Bronchitis: she has a chronic productivecoughno recent episodes of flares that required steroid. Still has SOB with activity and also has mild wheezing.She is out of Anoro at this time, discussed inhalers but she would like to hold off for now   GERD: symptoms are under control with Omeprazole but discussed potential long term risk, she is now on Ranitidine 150 mg twice daily, but because of recall we will change to pepcid.  She has been  off Omeprazolesince Summer 2018  Major Depression:  She is still has symptoms but does not want to take medications for depression, or sleep. States doing better but still worried about her daughter. Long history of depression, but refuses medication. No suicidal thoughts or ideation   Patient Active Problem List   Diagnosis Date Noted  . Mild episode of recurrent major depressive disorder ([place]) 08/01/2018  . SBO (small  bowel obstruction) and ileocolonic anastomosis from recent colectomy 05/22/2018  . Cancer of ascending colon - pT1N0 (0/21) s/p lap right colectomy 05/11/2018 05/21/2018  . Hypomagnesemia 05/21/2018  . Ileus following gastrointestinal surgery ([place]) 05/20/2018  . Essential hemorrhagic thrombocythemia ([place]) 03/29/2018  . Hypercalcemia 11/23/2015  . DDD (degenerative disc disease), lumbar 07/28/2015  . Spinal stenosis at L4-L5 level 06/12/2015  . Benign essential  HTN 05/25/2015  . Bunion 05/25/2015  . Cardiac enlargement 05/25/2015  . Cervical radicular pain 05/25/2015  . Back pain, chronic 05/25/2015  . Osteoarthritis 05/25/2015  . Dyslipidemia 05/25/2015  . Gastric reflux 05/25/2015  . Insomnia 05/25/2015  . Eczema intertrigo 05/25/2015  . Bronchitis, chronic, mucopurulent ([place]) 05/25/2015  . Numerous moles 05/25/2015  . Morbid obesity ([place]) 05/25/2015  . Perennial allergic rhinitis 05/25/2015  . DM (diabetes mellitus), type 2, uncontrolled, with renal complications ([place]) 97/12/6376  . Snores 05/25/2015  . History of artificial joint 05/25/2015  . Degeneration of intervertebral disc of cervical region 02/06/2015  . Neuritis or radiculitis due to rupture of lumbar intervertebral disc 01/14/2015  . Abnormal presence of protein in urine 05/04/2010  . Vitamin D deficiency 05/04/2010    Past Surgical History:  Procedure Laterality Date  . ABDOMINAL HYSTERECTOMY     due to cancer-partial  . BREAST BIOPSY Right    Benign  . COLONOSCOPY N/A 04/11/2015   Procedure: COLONOSCOPY;  Surgeon: Hulen Luster, MD;  Location: [facility] ENDOSCOPY;  Service: Gastroenterology;  Laterality: N/A;  . COLONOSCOPY WITH PROPOFOL N/A 03/13/2018   Procedure: COLONOSCOPY WITH PROPOFOL;  Surgeon: [name], MD;  Location: [facility] ENDOSCOPY;  Service: Gastroenterology;  Laterality: N/A;  . COLONOSCOPY WITH PROPOFOL N/A 12/25/2018   Procedure: COLONOSCOPY WITH PROPOFOL;  Surgeon: [name], MD;  Location: [facility] ENDOSCOPY;  Service: Gastroenterology;  Laterality: N/A;  . JOINT REPLACEMENT     left knee x3,  right knee 2005  . LAPAROSCOPIC RIGHT COLECTOMY Right 05/11/2018   Procedure: LAPAROSCOPIC RIGHT HEMICOLECTOMY ERAS PATHWAY;  Surgeon: Ileana Roup, MD;  Location: [facility] ORS;  Service: General;  Laterality: Right;  . SHOULDER ARTHROSCOPY WITH OPEN ROTATOR CUFF REPAIR Left 05/18/2016   Procedure: SHOULDER ARTHROSCOPY WITH OPEN ROTATOR CUFF REPAIR,distal clavicle excision,  decompression;  Surgeon: Corky Mull, MD;  Location: [facility] ORS;  Service: Orthopedics;  Laterality: Left;  . SPINAL FUSION  1994   C-Spine  . Transforaminal Epidural  02/20/2015   Injection into cervical spine- C5-6 Dr. Phyllis Ginger    Family History  Problem Relation Age of Onset  . Diabetes Mother   . Kidney disease Mother   . Hypertension Mother   . Healthy Father   . Asthma Daughter   . Diabetes Sister   . Kidney disease Sister   . Diabetes Brother     Social History   Socioeconomic History  . Marital status: Widowed    Spouse name: Jeneen Rinks  . Number of children: 4  . Years of education: some college  . Highest education level: 12th grade  Occupational History  . Occupation: Retired  Scientific laboratory technician  . Financial resource strain: Not hard at all  . Food insecurity:    Worry: Never true    Inability: Never true  . Transportation needs:    Medical: No    Non-medical: No  Tobacco Use  . Smoking status: Never Smoker  . Smokeless tobacco: Never Used  . Tobacco comment: smoking cessation materials not required  Substance and Sexual Activity  . Alcohol use: Not Currently  Alcohol/week: 0.0 standard drinks  . Drug use: No  . Sexual activity: Not Currently    Birth control/protection: Surgical  Lifestyle  . Physical activity:    Days per week: 0 days    Minutes per session: 0 min  . Stress: Only a little  Relationships  . Social connections:    Talks on phone: More than three times a week    Gets together: More than three times a week    Attends religious service: More than 4 times per year    Active member of club or organization: Yes    Attends meetings of clubs or organizations: More than 4 times per year    Relationship status: Widowed  . Intimate partner violence:    Fear of current or ex partner: No    Emotionally abused: No    Physically abused: No    Forced sexual activity: No  Other Topics Concern  . Not on file  Social History Narrative  . Not on file      Current Outpatient Medications:  .  acetaminophen (TYLENOL) 500 MG tablet, Take 1,000 mg by mouth every 6 (six) hours as needed for moderate pain or headache. , Disp: , Rfl:  .  Ascorbic Acid (VITAMIN C) 1000 MG tablet, Take 1,000 mg by mouth daily., Disp: , Rfl:  .  aspirin EC 81 MG tablet, Take 1 tablet (81 mg total) by mouth daily., Disp: 90 tablet, Rfl: 1 .  atorvastatin (LIPITOR) 80 MG tablet, TAKE 1 TABLET BY MOUTH ONCE DAILY, Disp: 90 tablet, Rfl: 0 .  Carboxymethylcellul-Glycerin (LUBRICATING EYE DROPS OP), Place 2 drops into both eyes 2 (two) times daily as needed (for dry eyes)., Disp: , Rfl:  .  cetirizine (ZYRTEC) 10 MG tablet, Take 10 mg by mouth daily as needed for allergies. , Disp: , Rfl:  .  Cholecalciferol (VITAMIN D-3) 1000 units CAPS, Take 1,000 Units by mouth daily. , Disp: , Rfl:  .  famotidine (PEPCID) 20 MG tablet, Take 1 tablet (20 mg total) by mouth 2 (two) times daily. In place of ranitidine, Disp: 180 tablet, Rfl: 1 .  fluticasone (FLONASE) 50 MCG/ACT nasal spray, USE TWO SPRAY IN EACH NOSTRIL ONCE DAILY, Disp: 16 g, Rfl: 0 .  glipiZIDE (GLUCOTROL XL) 10 MG 24 hr tablet, Take 1 tablet (10 mg total) by mouth daily with breakfast., Disp: 90 tablet, Rfl: 1 .  glucose blood (ADVOCATE REDI-CODE) test strip, 1 each by Other route QID., Disp: 100 each, Rfl: 12 .  hydrochlorothiazide (HYDRODIURIL) 25 MG tablet, Take 1 tablet (25 mg total) by mouth daily., Disp: 90 tablet, Rfl: 1 .  Lancets (ONETOUCH DELICA PLUS RSWNIO27O) MISC, CHECK GLUCOSE THREE TIMES DAILY, Disp: , Rfl:  .  lisinopril (PRINIVIL,ZESTRIL) 40 MG tablet, Take 1 tablet (40 mg total) by mouth at bedtime., Disp: 90 tablet, Rfl: 1 .  meclizine (ANTIVERT) 25 MG tablet, Take 1 tablet (25 mg total) by mouth every 6 (six) hours., Disp: 20 tablet, Rfl: 0 .  metFORMIN (GLUCOPHAGE-XR) 750 MG 24 hr tablet, Take 2 tablets (1,500 mg total) by mouth daily with breakfast., Disp: 180 tablet, Rfl: 1 .  metoCLOPramide  (REGLAN) 10 MG tablet, Take 1 tablet (10 mg total) by mouth 4 (four) times daily -  before meals and at bedtime., Disp: 270 tablet, Rfl: 1 .  Multiple Vitamins-Minerals (MULTIVITAMIN PO), Take 1 tablet by mouth daily., Disp: , Rfl:  .  Omega-3 Fatty Acids (OMEGA-3 CF PO), Take 660 mg by mouth  daily. Over the counter product, Disp: , Rfl:  .  pioglitazone (ACTOS) 15 MG tablet, Take 1 tablet (15 mg total) by mouth daily., Disp: 90 tablet, Rfl: 1 .  PROAIR HFA 108 (90 Base) MCG/ACT inhaler, INHALE 2 PUFFS INTO LUNGS EVERY 6 HOURS AS NEEDED FOR WHEEZING OR SHORTNESS OF BREATH, Disp: 9 each, Rfl: 0 .  traMADol (ULTRAM) 50 MG tablet, TAKE ONE HALF TO ONE TABLET BY MOUTH TWICE DAILY AS NEEDED, Disp: , Rfl: 3 .  umeclidinium-vilanterol (ANORO ELLIPTA) 62.5-25 MCG/INH AEPB, Inhale 1 puff into the lungs daily., Disp: 60 each, Rfl: 5  No Known Allergies  I personally reviewed active problem list, medication list, allergies, family history, social history with the patient/caregiver today.   ROS  Constitutional: Negative for fever or significant weight change.  Respiratory: Negative for cough and shortness of breath.   Cardiovascular: Negative for chest pain or palpitations.  Gastrointestinal: Negative for abdominal pain, no bowel changes.  Musculoskeletal: Negative for gait problem or joint swelling.  Skin: Negative for rash.  Neurological: Negative for dizziness or headache.  No other specific complaints in a complete review of systems (except as listed in HPI above).  Objective  Vitals:   03/01/19 1001  BP: 118/68  Pulse: 90  Resp: 16  Temp: 98.3 F (36.8 C)  TempSrc: Oral  SpO2: 98%  Weight: 231 lb 9.6 oz (105.1 kg)  Height: 5\' 4"  (1.626 m)    Body mass index is 39.75 kg/m.  Physical Exam  Constitutional: Patient appears well-developed and well-nourished. Obese  No distress.  HEENT: head atraumatic, normocephalic, pupils equal and reactive to light,neck supple, throat within  normal limits Cardiovascular: Normal rate, regular rhythm and normal heart sounds.  No murmur heard. No BLE edema. Pulmonary/Chest: Effort normal and breath sounds normal. No respiratory distress. Abdominal: Soft.  There is no tenderness. Psychiatric: Patient has a normal mood and affect. behavior is normal. Judgment and thought content normal.  Recent Results (from the past 2160 hour(s))  Glucose, capillary     Status: Abnormal   Collection Time: 12/25/18  7:08 AM  Result Value Ref Range   Glucose-Capillary 136 (H) 70 - 99 mg/dL  Surgical pathology     Status: None   Collection Time: 12/25/18  8:03 AM  Result Value Ref Range   SURGICAL PATHOLOGY      Surgical Pathology CASE: [id number] PATIENT: Jamal Maes Surgical Pathology Report     SPECIMEN SUBMITTED: A. Anastomosis; cbx B. Colon polyps x2, descending; cbx  CLINICAL HISTORY: None provided  PRE-OPERATIVE DIAGNOSIS: Malignant neoplasm of hepatic flexure (HCC) C18.3  POST-OPERATIVE DIAGNOSIS: 2 colon polyps in descending colon     DIAGNOSIS: A. COLON, ANASTOMOSIS; COLD BIOPSY: - CHRONICALLY INFLAMED ENTERIC MUCOSA. - NEGATIVE FOR DYSPLASIA AND MALIGNANCY.  B. COLON POLYPS X2, DESCENDING; COLD BIOPSY: - TUBULAR ADENOMAS. - NEGATIVE FOR HIGH-GRADE DYSPLASIA AND MALIGNANCY.   GROSS DESCRIPTION: A. Labeled: CBX anastomosis with surgery was performed Received: Formalin Tissue fragment(s): Several Size: Aggregate, 0.4 x 0.2 x 0.1 cm Description: Tan soft tissue fragments Entirely submitted in A1.  B. Labeled: CBX descending colon polyps (X2) Received: Formalin Tissue fragment(s): 2 Size: 0.3 and 0.5 cm Description: Tan soft tissue fragmen ts Entirely submitted in B1.    Final Diagnosis performed by Allena Napoleon, MD.   Electronically signed 12/26/2018 12:02:40PM The electronic signature indicates that the named Attending Pathologist has evaluated the specimen  Technical component performed at [facility],  [address], [place], [place] [address] Lab: [phone] Dir: Derinda Late  Perlie Gold, MD, MMM  Professional component performed at [facility], [facility], [place], [place], [place] [address] Lab: [phone] Dir: Dellia Nims. Rubinas, MD   POCT HgB A1C     Status: Normal   Collection Time: 03/01/19 10:05 AM  Result Value Ref Range   Hemoglobin A1C     HbA1c POC (<> result, manual entry)     HbA1c, POC (prediabetic range)     HbA1c, POC (controlled diabetic range) 6.9 0.0 - 7.0 %      PHQ2/9: Depression screen [facility] 2/9 03/01/2019 10/31/2018 08/01/2018 06/06/2018 02/24/2018  Decreased Interest 0 0 0 1 0  Down, Depressed, Hopeless 2 0 0 1 1  PHQ - 2 Score 2 0 0 2 1  Altered sleeping 2 3 3 1 1   Tired, decreased energy 1 3 3 1 1   Change in appetite 1 3 3 1 1   Feeling bad or failure about yourself  0 0 0 0 0  Trouble concentrating 0 1 1 0 0  Moving slowly or fidgety/restless 0 0 0 0 0  Suicidal thoughts 0 0 0 0 0  PHQ-9 Score 6 10 10 5 4   Difficult doing work/chores Not difficult at all Somewhat difficult Somewhat difficult Not difficult at all Not difficult at all    phq 9 is positive   Fall Risk: Fall Risk  10/31/2018 08/01/2018 06/06/2018 03/29/2018 02/24/2018  Falls in the past year? 0 No No No No  Number falls in past yr: 0 - - - -  Injury with Fall? 0 - - - -  Comment - - - - -  Risk for fall due to : - - - - Impaired balance/gait;Impaired vision  Risk for fall due to: Comment - - - - knee pain; arthritis; wears eyeglasses  Follow up - - - - -     Functional Status Survey: Is the patient deaf or have difficulty hearing?: No Does the patient have difficulty seeing, even when wearing glasses/contacts?: Yes Does the patient have difficulty concentrating, remembering, or making decisions?: No Does the patient have difficulty walking or climbing stairs?: No Does the patient have difficulty dressing or bathing?: No Does the patient have difficulty doing errands alone such  as visiting a doctor's office or shopping?: No    Assessment & Plan  1. Controlled type 2 diabetes mellitus with diabetic nephropathy, without long-term current use of insulin (HCC)  - POCT HgB A1C - glipiZIDE (GLUCOTROL XL) 10 MG 24 hr tablet; Take 1 tablet (10 mg total) by mouth daily with breakfast.  Dispense: 90 tablet; Refill: 1 - lisinopril (PRINIVIL,ZESTRIL) 40 MG tablet; Take 1 tablet (40 mg total) by mouth at bedtime.  Dispense: 90 tablet; Refill: 1 - metFORMIN (GLUCOPHAGE-XR) 750 MG 24 hr tablet; Take 2 tablets (1,500 mg total) by mouth daily with breakfast.  Dispense: 180 tablet; Refill: 1 - pioglitazone (ACTOS) 15 MG tablet; Take 1 tablet (15 mg total) by mouth daily.  Dispense: 90 tablet; Refill: 1  2. Benign essential HTN  - hydrochlorothiazide (HYDRODIURIL) 25 MG tablet; Take 1 tablet (25 mg total) by mouth daily.  Dispense: 90 tablet; Refill: 1 - lisinopril (PRINIVIL,ZESTRIL) 40 MG tablet; Take 1 tablet (40 mg total) by mouth at bedtime.  Dispense: 90 tablet; Refill: 1  3. Bronchitis, chronic, mucopurulent ([place])  She cannot afford Anoro, discussed getting nebulizer machine   4. Morbid obesity due to excess calories [facility])  Discussed with the patient the risk posed by an increased BMI. Discussed  importance of portion control, calorie counting and at least 150 minutes of physical activity weekly. Avoid sweet beverages and drink more water. Eat at least 6 servings of fruit and vegetables daily   5. Mild episode of recurrent major depressive disorder ([place])  She is doing better and does not want medication   6. Essential hemorrhagic thrombocythemia ([place])  Stable   7. Diabetes mellitus type 2 in obese [facility])  Discussed life style modification

## 2019-03-02 ENCOUNTER — Ambulatory Visit: Payer: PPO | Admitting: Family Medicine

## 2019-03-06 ENCOUNTER — Ambulatory Visit: Payer: Self-pay | Admitting: Pharmacist

## 2019-03-06 ENCOUNTER — Other Ambulatory Visit: Payer: Self-pay

## 2019-03-06 DIAGNOSIS — J411 Mucopurulent chronic bronchitis: Secondary | ICD-10-CM

## 2019-03-07 NOTE — Chronic Care Management (AMB) (Signed)
  Chronic Care Management   Follow Up Note   03/07/2019 Name: Cindy Griffin MRN: [id number] DOB: 02-06-1946  Referred by: Steele Sizer, MD Reason for referral : Chronic Care Management (Medication assistance) and Care Coordination   [name] is a 73 y.o. year old female who is a primary care patient of Steele Sizer, MD. The CCM team was consulted for assistance with chronic disease management and care coordination needs.    Review of patient status, including review of consultants reports, relevant laboratory and other test results, and collaboration with appropriate care team members and the patient's provider was performed as part of comprehensive patient evaluation and provision of chronic care management services.    Goals Addressed            This Visit's Progress   . "I need as much assistance with my medication as I can" (pt-stated)       Clinic Pharmacy Clinical Goal(s): (updated)  Over the next 30 days, [name] will provide the necessary supplementary documents (proof of out of pocket prescription expenditure, proof of household income) needed for medication assistance applications to CCM pharmacist.   Interventions:  CCM pharmacist will apply for medication assistance program for Anoro Ellipta made by [place], for Ozempic made by NovoNordisk, and Proventil made by [employer]  Updated 03/06/19: Patient has provided her personal income (not household as requested) and a signed [place] application  Interventions:  . CCM pharmacist will collaborate with HTA to obtain OOP expenditure  . CCM pharmacist will facilitate application submission for Anoro Ellipta, Ozempic, and Proventil  Patient Self Care Activities:  [name] Gather needed documents and drop off at [facility] office   *reactivated goal  Current Barriers:  . Film/video editor.  . Difficulty obtaining medications  Nurse Case Manager Clinical Goal(s):  [name] Over the next 14 days, patient will work with [facility] Pharmacist  to address needs related to medication affordibility specifically Anora and Ozempic  Interventions:  . Advised patient to complete previously provided application for med assist and to gather previously discussed documents needed to assist pharmacist with application  . Provided emotional support and reassurance related to current pandemic of COVID-19 . Discussed current CDC recommendations for social distancing and hand hygiene.  .   Patient Self Care Activities:  [name] Gather needed documents . Engage with [name] - completed 02/20/19 . Follow CDC COVID-19 guidelines including symptom reporting, social distancing, and hand hygiene  Please see past updates related to this goal by clicking on the "Past Updates" button in the selected goal           Telephone follow up appointment with CCM team member scheduled for: 2 weeks   [name], PharmD Clinical Pharmacist [facility] [phone]

## 2019-03-13 ENCOUNTER — Other Ambulatory Visit: Payer: Self-pay

## 2019-03-13 ENCOUNTER — Ambulatory Visit: Payer: Self-pay | Admitting: Pharmacist

## 2019-03-13 DIAGNOSIS — E1121 Type 2 diabetes mellitus with diabetic nephropathy: Secondary | ICD-10-CM

## 2019-03-13 DIAGNOSIS — J411 Mucopurulent chronic bronchitis: Secondary | ICD-10-CM

## 2019-03-14 ENCOUNTER — Ambulatory Visit: Payer: Self-pay

## 2019-03-14 DIAGNOSIS — E1165 Type 2 diabetes mellitus with hyperglycemia: Principal | ICD-10-CM

## 2019-03-14 DIAGNOSIS — IMO0002 Reserved for concepts with insufficient information to code with codable children: Secondary | ICD-10-CM

## 2019-03-14 DIAGNOSIS — E1129 Type 2 diabetes mellitus with other diabetic kidney complication: Secondary | ICD-10-CM

## 2019-03-14 DIAGNOSIS — I10 Essential (primary) hypertension: Secondary | ICD-10-CM

## 2019-03-14 NOTE — Chronic Care Management (AMB) (Signed)
  Chronic Care Management   Follow Up Note   03/14/2019 Name: Cindy Griffin MRN: [id number] DOB: 1946/09/14  Referred by: Steele Sizer, MD Reason for referral : Chronic Care Management (medication assistance) and Care Coordination   [name] is a 73 y.o. year old female who is a primary care patient of Steele Sizer, MD. The CCM team was consulted for assistance with chronic disease management and care coordination needs.    Review of patient status, including review of consultants reports, relevant laboratory and other test results, and collaboration with appropriate care team members and the patient's provider was performed as part of comprehensive patient evaluation and provision of chronic care management services.    Goals Addressed            This Visit's Progress   . "I need as much assistance with my medication as I can" (pt-stated)       Clinic Pharmacy Clinical Goal(s): (updated)  Over the next 30 days, [name] will provide the necessary supplementary documents (proof of out of pocket prescription expenditure, proof of household income) needed for medication assistance applications to CCM pharmacist.   Interventions:  CCM pharmacist will apply for medication assistance program for Anoro Ellipta made by [place], for Ozempic made by NovoNordisk, and Proventil made by [employer]ck  Updated 03/14/19: Patient has provided signed applications and Radio broadcast assistant.   Interventions:  . CCM pharmacist will collaborate with HTA to obtain OOP expenditure  . CCM pharmacist will facilitate application submission for Anoro Ellipta, and Proventil . CCM pharmacist to collaborate with Dr. Ancil Boozer to ensure patient should no longer be taking Ozempic. If Dr. Ancil Boozer would like patient to be on Ozempic, CCM pharmaicst will proceed with Ozempic (Novonordisk) medication assistance application.   Patient Self Care Activities:  [name] Gather needed documents and drop off at [facility] office    *reactivated goal  Current Barriers:  . Film/video editor.  . Difficulty obtaining medications  Nurse Case Manager Clinical Goal(s):  [name] Over the next 14 days, patient will work with [facility] Pharmacist to address needs related to medication affordibility specifically Anora and Ozempic  Interventions:  . Advised patient to complete previously provided application for med assist and to gather previously discussed documents needed to assist pharmacist with application  . Provided emotional support and reassurance related to current pandemic of COVID-19 . Discussed current CDC recommendations for social distancing and hand hygiene.  .   Patient Self Care Activities:  [name] Gather needed documents . Engage with [facility] Pharmacist - completed 02/20/19 . Follow CDC COVID-19 guidelines including symptom reporting, social distancing, and hand hygiene  Please see past updates related to this goal by clicking on the "Past Updates" button in the selected goal           Telephone follow up appointment with CCM team member scheduled for: 2 weeks with PharmD or sooner if applications are processed before then Follow up with provider re: does patient need to be on Ozempic in light of recent A1c improvement?  [name], PharmD Clinical Pharmacist [facility] [phone]

## 2019-03-14 NOTE — Chronic Care Management (AMB) (Signed)
  Chronic Care Management   Note  03/14/2019 Name: Cindy Griffin MRN: [id number] DOB: 1946/06/21  Care Coordination: CCM RN CM received incoming call from [name], primary care patient of Dr. Ancil Boozer. Ms. Slotnick has an ongoing relationship with CCM RN CM and [facility] Clinic Pharmacist. Ms. Erlich notified CCM RN CM that "papers for medication assistance" have been dropped of at Dr. Ancil Boozer office this morning.  CCM RN CM notified [place] Rollene Rotunda, RN, BSN Nurse Care Coordinator [facility] Care Management  [phone]

## 2019-03-14 NOTE — Patient Instructions (Signed)
Goals Addressed            This Visit's Progress   . "I need as much assistance with my medication as I can" (pt-stated)       Clinic Pharmacy Clinical Goal(s): (updated)  Over the next 30 days, Cindy Griffin will provide the necessary supplementary documents (proof of out of pocket prescription expenditure, proof of household income) needed for medication assistance applications to CCM Griffin.   Interventions:  CCM Griffin will apply for medication assistance program for Anoro Ellipta made by [place], for Ozempic made by NovoNordisk, and Proventil made by [employer]ck  Updated 03/14/19: Patient has provided signed applications and Radio broadcast assistant.   Interventions:  . CCM Griffin will collaborate with HTA to obtain OOP expenditure  . CCM Griffin will facilitate application submission for Anoro Ellipta, and Proventil . CCM Griffin to collaborate with Dr. Ancil Boozer to ensure patient should no longer be taking Ozempic. If Dr. Ancil Boozer would like patient to be on Ozempic, CCM pharmaicst will proceed with Ozempic (Novonordisk) medication assistance application.   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 Advised patient to complete previously provided application for med assist and to gather previously discussed documents needed to assist Griffin with application  . Provided emotional support and reassurance related to current pandemic of COVID-19 . Discussed current CDC recommendations for social distancing and hand hygiene.  .   Patient Self Care Activities:  [name] Gather needed documents . Engage with [facility] Griffin -  completed 02/20/19 . Follow CDC COVID-19 guidelines including symptom reporting, social distancing, and hand hygiene  Please see past updates related to this goal by clicking on the "Past Updates" button in the selected goal          The patient verbalized understanding of instructions provided today and declined a print copy of patient instruction materials.

## 2019-03-19 ENCOUNTER — Ambulatory Visit: Payer: PPO | Admitting: Gastroenterology

## 2019-03-27 ENCOUNTER — Ambulatory Visit: Payer: Self-pay

## 2019-03-27 DIAGNOSIS — E1165 Type 2 diabetes mellitus with hyperglycemia: Principal | ICD-10-CM

## 2019-03-27 DIAGNOSIS — I10 Essential (primary) hypertension: Secondary | ICD-10-CM

## 2019-03-27 DIAGNOSIS — J411 Mucopurulent chronic bronchitis: Secondary | ICD-10-CM

## 2019-03-27 DIAGNOSIS — IMO0002 Reserved for concepts with insufficient information to code with codable children: Secondary | ICD-10-CM

## 2019-03-27 DIAGNOSIS — E1129 Type 2 diabetes mellitus with other diabetic kidney complication: Secondary | ICD-10-CM

## 2019-03-27 NOTE — Chronic Care Management (AMB) (Signed)
  Chronic Care Management   Note  03/27/2019 Name: Cindy Griffin MRN: [id number] DOB: 03-19-1946  Care Coordination: Successful telephone encounter to Cindy Griffin who left message with CCM RN CM requesting call back to discuss denial letter for Anoro inhaler. [name] states the letter indicates she did not complete the date on page 5, a prescription was not submitted with the assistance application and she did not provide her pharmacy out of pocket expense to date.  She is requesting assistance with getting the application amended and resent. She will go to [employer] and obtain spent out of pocket amount and provide to [place] for application resubmission.  CCM RN CM also discussed patients recent EOB from [place] stating CCM Services were not covered for March. Patient called HTA and per patient was told it was a mistake and services were covered.  Plan: CCM RN CM collaborated with [employer] Pharmacist who will follow up with patient within 7 days.    Sabas Frett E. Rollene Rotunda, RN, BSN Nurse Care Coordinator [facility] Care Management  [phone]

## 2019-03-29 ENCOUNTER — Ambulatory Visit (INDEPENDENT_AMBULATORY_CARE_PROVIDER_SITE_OTHER): Payer: PPO | Admitting: Pharmacist

## 2019-03-29 ENCOUNTER — Telehealth: Payer: Self-pay

## 2019-03-29 ENCOUNTER — Other Ambulatory Visit: Payer: Self-pay | Admitting: Family Medicine

## 2019-03-29 DIAGNOSIS — J411 Mucopurulent chronic bronchitis: Secondary | ICD-10-CM

## 2019-03-29 DIAGNOSIS — K219 Gastro-esophageal reflux disease without esophagitis: Secondary | ICD-10-CM

## 2019-03-29 DIAGNOSIS — E1165 Type 2 diabetes mellitus with hyperglycemia: Secondary | ICD-10-CM

## 2019-03-29 DIAGNOSIS — E1129 Type 2 diabetes mellitus with other diabetic kidney complication: Secondary | ICD-10-CM | POA: Diagnosis not present

## 2019-03-29 DIAGNOSIS — IMO0002 Reserved for concepts with insufficient information to code with codable children: Secondary | ICD-10-CM

## 2019-03-29 NOTE — Chronic Care Management (AMB) (Signed)
Chronic Care Management   Note  03/29/2019 Name: Cindy Griffin MRN: [id number] DOB: 09/29/1946  Reason for referral: Medication assistance  Referral source: Dr. Steele Sizer Referral medication(s): Anoro Ellipta, Ozempic, Proventil Current insurance:[employer]   Telephone outreach to patient today to discuss [place] letter and Dr. Ancil Boozer recommendation to continue Ozempic at this time.   Was unable to reach patient via telephone today and have left HIPAA compliant voicemail asking patient to return my call. (unsuccessful outreach #1).   Objective: No Known Allergies  Medications Reviewed Today    Reviewed by Steele Sizer, MD (Physician) on 03/01/19 at [place] List Status: <None>  Medication Order Taking? Sig Documenting Provider Last Dose Status Informant  acetaminophen (TYLENOL) 500 MG tablet [id number]  Take 1,000 mg by mouth every 6 (six) hours as needed for moderate pain or headache.  [provider]  Active Self  Ascorbic Acid (VITAMIN C) 1000 MG tablet [id number]  Take 1,000 mg by mouth daily. [provider]  Active Self  aspirin EC 81 MG tablet [id number]  Take 1 tablet (81 mg total) by mouth daily. Steele Sizer, MD  Active Self  atorvastatin (LIPITOR) 80 MG tablet [id number]  TAKE 1 TABLET BY MOUTH ONCE DAILY Sowles, Drue Stager, MD  Active   Blood Glucose Monitoring Suppl (ACCU-CHEK NANO SMARTVIEW) w/Device KIT [id number]  1 Device by Does not apply route 3 (three) times daily. Steele Sizer, MD  Active   Carboxymethylcellul-Glycerin (LUBRICATING EYE DROPS OP) [id number]  Place 2 drops into both eyes 2 (two) times daily as needed (for dry eyes). [provider]  Active Self  cetirizine (ZYRTEC) 10 MG tablet [id number]  Take 10 mg by mouth daily as needed for allergies.  [provider]  Active Self           Med Note Arvella Nigh, Lilia Pro A   Thu May 06, 2016 12:00 PM)    Cholecalciferol (VITAMIN D-3) 1000 units CAPS [id number]  Take 1,000  Units by mouth daily.  [provider]  Active Self  famotidine (PEPCID) 20 MG tablet [id number]  Take 1 tablet (20 mg total) by mouth 2 (two) times daily. In place of ranitidine Steele Sizer, MD  Active            Med Note Clelia Croft Nov 16, 2018  2:25 PM)    ferrous sulfate 325 (65 FE) MG EC tablet [id number]  Take 325 mg by mouth daily.  [provider]  Active Self           Med Note Kary Kos, Garnetta Buddy Nov 16, 2018  2:25 PM) Takes three times per week to decrease constipation  fluticasone (FLONASE) 50 MCG/ACT nasal spray [id number]  USE TWO SPRAY IN EACH NOSTRIL ONCE DAILY Steele Sizer, MD  Active   glipiZIDE (GLUCOTROL XL) 10 MG 24 hr tablet [id number]  TAKE 1 TABLET BY MOUTH ONCE DAILY WITH BREAKFAST  Patient not taking:  Reported on 12/25/2018   Hubbard Hartshorn, FNP  Active   glucose blood (ADVOCATE REDI-CODE) test strip [id number]  1 each by Other route QID. Steele Sizer, MD  Active Self           Med Note Clelia Croft Nov 16, 2018  1:34 PM) New prescription for preferred brand  hydrochlorothiazide (HYDRODIURIL) 25 MG tablet [id number]  TAKE 1 TABLET BY MOUTH ONCE DAILY Ancil Boozer, Drue Stager, MD  Active   Lancets [facility] DELICA PLUS [id number]) MISC  [id number]  CHECK GLUCOSE THREE TIMES DAILY [provider]  Active   lisinopril (PRINIVIL,ZESTRIL) 40 MG tablet [id number]  TAKE 1 TABLET BY MOUTH AT BEDTIME Steele Sizer, MD  Active   meclizine (ANTIVERT) 25 MG tablet [id number]  Take 1 tablet (25 mg total) by mouth every 6 (six) hours. Steele Sizer, MD  Active   metFORMIN (GLUCOPHAGE-XR) 750 MG 24 hr tablet [id number]  TAKE 1 TABLET BY MOUTH ONCE DAILY WITH Alta Cor[place], Drue Stager, MD  Active   metoCLOPramide (REGLAN) 10 MG tablet [id number]  Take 1 tablet (10 mg total) by mouth 4 (four) times daily -  before meals and at bedtime. Steele Sizer, MD  Active   Multiple Vitamins-Minerals (MULTIVITAMIN PO) [id number]  Take 1 tablet by  mouth daily. [provider]  Active Self  Omega-3 Fatty Acids (OMEGA-3 CF PO) [id number]  Take 660 mg by mouth daily. Over the counter product [provider]  Active Self  ondansetron (ZOFRAN-ODT) 4 MG disintegrating tablet [id number]  DISSOLVE 1 TABLET IN MOUTH EVERY 8 HOURS AS NEEDED FOR NAUSEA AND VOMITING [provider]  Active Self  pioglitazone (ACTOS) 15 MG tablet [id number]  TAKE 1 TABLET BY MOUTH ONCE DAILY Sowles, Drue Stager, MD  Active   polyethylene glycol (GOLYTELY) 236 g solution [id number]  Drink one 8 oz glass every 20 minutes until entire container is finished  Patient not taking:  Reported on 10/31/2018   [name], MD  Active   PROAIR HFA [phone] Base) MCG/ACT inhaler [id number]  INHALE 2 PUFFS INTO LUNGS EVERY 6 HOURS AS NEEDED FOR WHEEZING OR SHORTNESS OF Cherlynn June, Drue Stager, MD  Active   Semaglutide [facility]) 1 MG/DOSE [place] [facility] [id number]  Inject 0.5-1 mg into the skin once a week.  Patient taking differently:  Inject 0.5 mg into the skin every Monday.    Steele Sizer, MD  Active Self  traMADol (ULTRAM) 50 MG tablet [id number]  TAKE ONE HALF TO ONE TABLET BY MOUTH TWICE DAILY AS NEEDED [provider]  Active   umeclidinium-vilanterol (ANORO ELLIPTA) 62.5-25 MCG/INH AEPB [id number]  Inhale 1 puff into the lungs daily. Steele Sizer, MD  Active   valACYclovir (VALTREX) 1000 MG tablet [id number]  Take 1 tablet (1,000 mg total) by mouth 2 (two) times daily.  Patient not taking:  Reported on 11/16/2018   Steele Sizer, MD  Active            Medication Assistance Findings:   Patient Assistance Programs: 1) Anoro Ellipta made by [place] requirement met: _0  Yes _1  No _2  Unknown o Out-of-pocket prescription expenditure met:    _3  Yes _4  No  _5  Unknown  <IOEVOJJKKXFGHWEX>_9<\/BZJIRCVELFYBOFBP>_1  Not applicable - Will update application with recent TROOP but patient still has not met $600 minimum - Re-evaluation of pharmacotherapy regimen is warranted        2)  Ozempic  made by [employer] o Income requirement met: _7  Yes _8  No  _9  Unknown o Out-of-pocket prescription expenditure met:   _10  Yes _11  No   _12  Unknown <WCHENIDPOEUMPNTI>_1<\/WERXVQMGQQPYPPJK>_93  Not applicable - Out of pocket expenditure has been waived in light of COVID19 pandemic        3)  Proventil made by [employer] o Income requirement met: _14  Yes _15  No  _16  Unknown o Out-of-pocket prescription expenditure met:   _17  Yes _18  No   _19  Unknown <OIZTIWPYKDXIPJAS>_5<\/KNLZJQBHALPFXTKW>_40  Not applicable  Application submitted 9/73/53 via mail   Additional medication assistance options reviewed with patient as warranted:  No other options  identified  Goals Addressed            This Visit's Progress   . "I need as much assistance with my medication as I can" (pt-stated)       Clinic Pharmacy Clinical Goal(s): (updated)  Over the next 30 days, [name] will provide the necessary supplementary documents (proof of out of pocket prescription expenditure, proof of household income) needed for medication assistance applications to CCM pharmacist.   Interventions:  . CCM pharmacist will collaborate with HTA to obtain OOP expenditure  . CCM pharmacist will facilitate application submission for Anoro Ellipta, and Proventil . CCM pharmacist to collaborate with Dr. Ancil Boozer to ensure patient should no longer be taking Ozempic. If Dr. Ancil Boozer would like patient to be on Ozempic, CCM pharmaicst will proceed with Ozempic (Novonordisk) medication assistance application.   Updated 03/29/19:  Dr. Ancil Boozer would like to continue Ozempic. Updated application with patient portion and Social Security benefit statement, left provider portion for Dr. 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 Advised patient to complete previously provided application for med assist and to gather previously discussed documents needed to assist pharmacist with application  . Provided emotional support and reassurance related to current pandemic of COVID-19 . Discussed current CDC recommendations for social distancing and hand hygiene.  .   Patient Self Care Activities:  [name] Gather needed documents . Engage with [facility] Pharmacist - completed 02/20/19 . Follow CDC COVID-19 guidelines including symptom reporting, social distancing, and hand hygiene  Please see past updates related to this goal by clicking on the "Past Updates" button in the selected goal             Follow up with patient within 7 days  [name], PharmD Clinical Pharmacist [place] [phone]

## 2019-04-02 ENCOUNTER — Telehealth: Payer: Self-pay

## 2019-04-02 ENCOUNTER — Ambulatory Visit (INDEPENDENT_AMBULATORY_CARE_PROVIDER_SITE_OTHER): Payer: PPO | Admitting: Pharmacist

## 2019-04-02 DIAGNOSIS — J411 Mucopurulent chronic bronchitis: Secondary | ICD-10-CM

## 2019-04-02 DIAGNOSIS — E1129 Type 2 diabetes mellitus with other diabetic kidney complication: Secondary | ICD-10-CM

## 2019-04-02 DIAGNOSIS — E1165 Type 2 diabetes mellitus with hyperglycemia: Secondary | ICD-10-CM

## 2019-04-02 DIAGNOSIS — IMO0002 Reserved for concepts with insufficient information to code with codable children: Secondary | ICD-10-CM

## 2019-04-02 NOTE — Telephone Encounter (Signed)
Copied from [place] [id number]. Topic: General - Other >> Apr 02, 2019  9:53 AM Rayann Heman wrote: Reason for CRM: pt called and stated that she would like speak to [facility] or social worked.  Pt states that she is having trouble getting food. Pt states that she would need lunch today if possible. Please advise

## 2019-04-02 NOTE — Telephone Encounter (Signed)
Patient received an automated Emmi call last week that was sent out by [facility] population health asking patients if they need assistance for food or prescriptions and patient answered YES to the auto call and was transferred to our team. Upon speaking with patient she did not state she needed anything at the time. Meals are delivered weekly for patients who are unable to get out of the house for food due to transportation and/or social distancing due to Covid-19. I will follow up with patient to see how she is doing. Thank you.

## 2019-04-02 NOTE — Telephone Encounter (Signed)
Patient states that a woman named Cindy Griffin called her last week with information about free food for seniors and she wanted some more information on that. Today she felt dizzy and was hoping someone could bring her lunch due to the dizziness.   However, she is feeling better now and has eaten lunch. She is still interested in hearing from [place].  I am not sure if she means Chrystal (SW) or Kasey (LPN) or if this person was from outside the practice as I don't see notes from last week! I will forward message to Chrystal and [place] for review.   [name], PharmD Clinical Pharmacist [facility] [phone]

## 2019-04-02 NOTE — Telephone Encounter (Signed)
Spoke to patient, added to [facility] list for meal delivery. Patient to receive meals starting later this week.

## 2019-04-03 NOTE — Chronic Care Management (AMB) (Signed)
  Chronic Care Management   Follow Up Note   04/03/2019 Name: Cindy Griffin MRN: [id number] DOB: 12/16/1945  Referred by: Steele Sizer, MD Reason for referral : Chronic Care Management (Medication assistance )   Cindy Griffin is a 73 y.o. year old female who is a primary care patient of Steele Sizer, MD. The CCM team was consulted for assistance with chronic disease management and care coordination needs.    Review of patient status, including review of consultants reports, relevant laboratory and other test results, and collaboration with appropriate care team members and the patient's provider was performed as part of comprehensive patient evaluation and provision of chronic care management services.  Assessment #Ozempic: Submitted completed application, uploaded to media tab  #Anoro: Updated [place] with patient's most recent TROOP. However, patient unlikely to meet $600 OOP minimum. Discuss alternatives with Dr. Ancil Boozer  Goals Addressed            This Visit's Progress   . "I need as much assistance with my medication as I can" (pt-stated)       Clinic Pharmacy Clinical Goal(s): (updated)  Over the next 30 days, [name] will provide the necessary supplementary documents (proof of out of pocket prescription expenditure, proof of household income) needed for medication assistance applications to CCM pharmacist.   Interventions:  . CCM pharmacist will collaborate with HTA to obtain OOP expenditure  . CCM pharmacist will facilitate application submission for Anoro Ellipta, and Proventil . CCM pharmacist to collaborate with Dr. Ancil Boozer to ensure patient should no longer be taking Ozempic. If Dr. Ancil Boozer would like patient to be on Ozempic, CCM pharmaicst will proceed with Ozempic (Novonordisk) medication assistance application.   Updated 04/02/19:  Dr. Ancil Boozer would like to continue Ozempic.Submitted completed application to Novonordisk, uploaded to media tab  Patient received [place]  rejection letter; will submit updated TROOP for patient but it appears she has not spent enough out of pocket to qualify  Develop another COPD regimen for patient as she may not qualify for Anoro through [place] for several months   Patient Self Care Activities:  . 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 Advised patient to complete previously provided application for med assist and to gather previously discussed documents needed to assist pharmacist with application  . Provided emotional support and reassurance related to current pandemic of COVID-19 . Discussed current CDC recommendations for social distancing and hand hygiene.  .   Patient Self Care Activities:  [name] Gather needed documents . Engage with [facility] Pharmacist - completed 02/20/19 . Follow CDC COVID-19 guidelines including symptom reporting, social distancing, and hand hygiene  Please see past updates related to this goal by clicking on the "Past Updates" button in the selected goal         Plan: Recommendations discussed with Dr. Ancil Boozer: patient will not be able to fill Anoro Ellipta due to high cost and is unlikely to qualify for assistance. Consider alternative therapies: Spiriva or Symbicort  Follow up: Telephone follow up appointment with CCM team member scheduled for: 7 days with PharmD   [name], PharmD Clinical Pharmacist [facility] [phone]

## 2019-04-03 NOTE — Patient Instructions (Signed)
Goals Addressed            This Visit's Progress   . "I need as much assistance with my medication as I can" (pt-stated)       Clinic Pharmacy Clinical Goal(s): (updated)  Over the next 30 days, [name] will provide the necessary supplementary documents (proof of out of pocket prescription expenditure, proof of household income) needed for medication assistance applications to CCM pharmacist.   Interventions:  . CCM pharmacist will collaborate with HTA to obtain OOP expenditure  . CCM pharmacist will facilitate application submission for Anoro Ellipta, and Proventil . CCM pharmacist to collaborate with Dr. Ancil Boozer to ensure patient should no longer be taking Ozempic. If Dr. Ancil Boozer would like patient to be on Ozempic, CCM pharmaicst will proceed with Ozempic (Novonordisk) medication assistance application.   Updated 04/02/19:  Dr. Ancil Boozer would like to continue Ozempic.Submitted completed application to Novonordisk, uploaded to media tab  Patient received [place] rejection letter; will submit updated TROOP for patient but it appears she has not spent enough out of pocket to qualify  Develop another COPD regimen for patient as she may not qualify for Anoro through [place] for several months   Patient Self Care Activities:  . 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 Advised patient to complete previously provided application for med assist and to gather previously discussed documents needed to assist pharmacist with application  . Provided emotional support and reassurance related to current pandemic of COVID-19 . Discussed current CDC recommendations for social  distancing and hand hygiene.  .   Patient Self Care Activities:  [name] Gather needed documents . Engage with [facility] Pharmacist - completed 02/20/19 . Follow CDC COVID-19 guidelines including symptom reporting, social distancing, and hand hygiene  Please see past updates related to this goal by clicking on the "Past Updates" button in the selected goal          The patient verbalized understanding of instructions provided today and declined a print copy of patient instruction materials.

## 2019-04-16 DIAGNOSIS — M4802 Spinal stenosis, cervical region: Secondary | ICD-10-CM | POA: Diagnosis not present

## 2019-04-16 DIAGNOSIS — M5136 Other intervertebral disc degeneration, lumbar region: Secondary | ICD-10-CM | POA: Diagnosis not present

## 2019-04-16 DIAGNOSIS — M1612 Unilateral primary osteoarthritis, left hip: Secondary | ICD-10-CM | POA: Diagnosis not present

## 2019-04-16 DIAGNOSIS — M5030 Other cervical disc degeneration, unspecified cervical region: Secondary | ICD-10-CM | POA: Diagnosis not present

## 2019-04-16 DIAGNOSIS — M5416 Radiculopathy, lumbar region: Secondary | ICD-10-CM | POA: Diagnosis not present

## 2019-04-16 DIAGNOSIS — M5412 Radiculopathy, cervical region: Secondary | ICD-10-CM | POA: Diagnosis not present

## 2019-04-16 DIAGNOSIS — M48062 Spinal stenosis, lumbar region with neurogenic claudication: Secondary | ICD-10-CM | POA: Diagnosis not present

## 2019-05-03 ENCOUNTER — Ambulatory Visit (INDEPENDENT_AMBULATORY_CARE_PROVIDER_SITE_OTHER): Payer: PPO | Admitting: Gastroenterology

## 2019-05-03 ENCOUNTER — Other Ambulatory Visit: Payer: Self-pay | Admitting: Family Medicine

## 2019-05-03 DIAGNOSIS — E785 Hyperlipidemia, unspecified: Secondary | ICD-10-CM

## 2019-05-03 DIAGNOSIS — C183 Malignant neoplasm of hepatic flexure: Secondary | ICD-10-CM | POA: Diagnosis not present

## 2019-05-03 NOTE — Progress Notes (Signed)
Cindy Griffin , MD [address]  [place]  [place], [place] [address]  Main: [phone]  Fax: [phone]   Primary Care Physician: Cindy Sizer, MD  Virtual Visit via Telephone Note  I connected with patient on 05/03/19 at  9:30 AM EDT by telephone and verified that I am speaking with the correct person using two identifiers.   I discussed the limitations, risks, security and privacy concerns of performing an evaluation and management service by telephone and the availability of in person appointments. I also discussed with the patient that there may be a patient responsible charge related to this service. The patient expressed understanding and agreed to proceed.  Location of Patient: Home Location of Provider: Home Persons involved: Patient and provider only   History of Present Illness: Chief Complaint  Patient presents with  . Malignant neoplasm of hepatic flexure    HPI: Cindy Griffin is a 73 y.o. female   Summary of history :  I performed her colonoscopy on 03/13/18 for surveillance due to prior history of colon polyps.  I also did note a20 mm polypoid lesion was found at the hepatic flexure.  Pathology confirmedLEAST HIGH GRADE DYSPLASIA ARISING IN A TUBULAR ADENOMA WITH ULCERATION.She underwent Rt hemicolectomy on 05/11/18 : pathology showed INVASIVE ADENOCARCINOMA, WELL-DIFFERENTIATED, SPANNING 1.1 CM. TUMOR INVADES INTO SUPERFICIAL SUBMUCOSA. RESECTION MARGINS ARE NEGATIVE. TWENTY-ONE OF TWENTY-ONE LYMPH NODES NEGATIVE FOR CARCINOMA (0/21).  Post op course complicated by ileus. CT chest/abdomen showed no metastasis.  Interval history 06/20/2018-05/03/2019  11/2018 : surveillance colonoscopy showed 2 diminutive polyps that were resected. They were tubular adenomas. No recurrence.   Doing well no issues- she has informed her kids to get a colonoscopy    She says she had a CEA December was 5.4- lower than it was previously. She had a CT scan of the  chest and abdomen which showed no abnormality. She has a follow up with him in July.     Current Outpatient Medications  Medication Sig Dispense Refill  . acetaminophen (TYLENOL) 500 MG tablet Take 1,000 mg by mouth every 6 (six) hours as needed for moderate pain or headache.     . Ascorbic Acid (VITAMIN C) 1000 MG tablet Take 1,000 mg by mouth daily.    [name] aspirin EC 81 MG tablet Take 1 tablet (81 mg total) by mouth daily. 90 tablet 1  . atorvastatin (LIPITOR) 80 MG tablet TAKE 1 TABLET BY MOUTH ONCE DAILY 90 tablet 0  . Carboxymethylcellul-Glycerin (LUBRICATING EYE DROPS OP) Place 2 drops into both eyes 2 (two) times daily as needed (for dry eyes).    . cetirizine (ZYRTEC) 10 MG tablet Take 10 mg by mouth daily as needed for allergies.     . Cholecalciferol (VITAMIN D-3) 1000 units CAPS Take 1,000 Units by mouth daily.     . famotidine (PEPCID) 20 MG tablet TAKE 1 TABLET BY MOUTH TWICE DAILY IN  PLACE  OF  RANITIDINE 180 tablet 0  . fluticasone (FLONASE) 50 MCG/ACT nasal spray USE TWO SPRAY IN EACH NOSTRIL ONCE DAILY 16 g 0  . glipiZIDE (GLUCOTROL XL) 10 MG 24 hr tablet Take 1 tablet (10 mg total) by mouth daily with breakfast. 90 tablet 1  . glucose blood (ADVOCATE REDI-CODE) test strip 1 each by Other route QID. 100 each 12  . hydrochlorothiazide (HYDRODIURIL) 25 MG tablet Take 1 tablet (25 mg total) by mouth daily. 90 tablet 1  . Lancets (ONETOUCH DELICA PLUS [id number]) MISC CHECK GLUCOSE  THREE TIMES DAILY    . lisinopril (PRINIVIL,ZESTRIL) 40 MG tablet Take 1 tablet (40 mg total) by mouth at bedtime. 90 tablet 1  . meclizine (ANTIVERT) 25 MG tablet Take 1 tablet (25 mg total) by mouth every 6 (six) hours. 20 tablet 0  . metFORMIN (GLUCOPHAGE-XR) 750 MG 24 hr tablet Take 2 tablets (1,500 mg total) by mouth daily with breakfast. 180 tablet 1  . Multiple Vitamins-Minerals (MULTIVITAMIN PO) Take 1 tablet by mouth daily.    . Omega-3 Fatty Acids (OMEGA-3 CF PO) Take 660 mg by mouth daily. Over  the counter product    . pioglitazone (ACTOS) 15 MG tablet Take 1 tablet (15 mg total) by mouth daily. 90 tablet 1  . PROAIR HFA 108 (90 Base) MCG/ACT inhaler INHALE 2 PUFFS INTO LUNGS EVERY 6 HOURS AS NEEDED FOR WHEEZING OR SHORTNESS OF BREATH 9 each 0  . traMADol (ULTRAM) 50 MG tablet TAKE ONE HALF TO ONE TABLET BY MOUTH TWICE DAILY AS NEEDED  3  . umeclidinium-vilanterol (ANORO ELLIPTA) 62.5-25 MCG/INH AEPB Inhale 1 puff into the lungs daily. 60 each 5  . metoCLOPramide (REGLAN) 10 MG tablet Take 1 tablet (10 mg total) by mouth 4 (four) times daily -  before meals and at bedtime. (Patient not taking: Reported on 05/03/2019) 270 tablet 1   No current facility-administered medications for this visit.     Allergies as of 05/03/2019  . (No Known Allergies)    Review of Systems:    All systems reviewed and negative except where noted in HPI.   Observations/Objective:  Labs: CMP     Component Value Date/Time   NA 141 10/31/2018 1206   NA 141 11/21/2015 1125   K 4.4 10/31/2018 1206   CL 104 10/31/2018 1206   CO2 27 10/31/2018 1206   GLUCOSE 119 (H) 10/31/2018 1206   BUN 21 10/31/2018 1206   BUN 24 11/21/2015 1125   CREATININE 0.97 (H) 10/31/2018 1206   CALCIUM 9.8 10/31/2018 1206   PROT 6.9 10/31/2018 1206   PROT 7.3 11/21/2015 1125   ALBUMIN 2.8 (L) 05/29/2018 0525   ALBUMIN 4.6 11/21/2015 1125   AST 14 10/31/2018 1206   ALT 13 10/31/2018 1206   ALKPHOS 89 05/29/2018 0525   BILITOT 0.4 10/31/2018 1206   BILITOT 0.4 11/21/2015 1125   GFRNONAA 58 (L) 10/31/2018 1206   GFRAA 68 10/31/2018 1206   Lab Results  Component Value Date   WBC 8.9 10/31/2018   HGB 11.5 (L) 10/31/2018   HCT 34.6 (L) 10/31/2018   MCV 82.2 10/31/2018   PLT 348 10/31/2018    Imaging Studies: No results found.  Assessment and Plan:   Cindy Griffin is a 73 y.o.o. y/o femalehere to follow up large ulcerated polyp seen on index colonoscopy , referred for surgery and found to have invasive  adenocarcinoma, node negative, margins clear.  T1N0.Colonoscopy in 11/2018 showed no recurrence. She mentions CEA checked has been slightly elevated but negative CT scans in 11/2018. She has a follow up with Dr Dema Severin in July- I would think if the CEA is still elevated will need referral to oncology and may be a PET scan .    Follow Up Instructions: End of July    I discussed the assessment and treatment plan with the patient. The patient was provided an opportunity to ask questions and all were answered. The patient agreed with the plan and demonstrated an understanding of the instructions.   The patient was advised to  call back or seek an in-person evaluation if the symptoms worsen or if the condition fails to improve as anticipated.  I provided 13 minutes of non-face-to-face time during this encounter.  Dr Cindy Bellows MD,MRCP [facility]) Gastroenterology/Hepatology Pager: [phone]   Speech recognition software was used to dictate this note.

## 2019-05-03 NOTE — Telephone Encounter (Signed)
Refill Request for Cholesterol medication. Atorvastatin to [facility].   Last visit: 03/01/2019   Lab Results  Component Value Date   CHOL 118 10/31/2018   HDL 58 10/31/2018   LDLCALC 45 10/31/2018   TRIG 73 10/31/2018   CHOLHDL 2.0 10/31/2018    Follow up on 07/02/2019

## 2019-05-08 ENCOUNTER — Ambulatory Visit: Payer: PPO | Admitting: Gastroenterology

## 2019-05-14 DIAGNOSIS — R970 Elevated carcinoembryonic antigen [CEA]: Secondary | ICD-10-CM | POA: Diagnosis not present

## 2019-05-17 ENCOUNTER — Telehealth: Payer: Self-pay

## 2019-05-17 NOTE — Telephone Encounter (Signed)
Received referral from Dr. Orest Dikes office, called patient and she would like to be seen at [facility] which is closer to home. Spoke with Isaiah Blakes NPS and she will send referral to [place].

## 2019-05-21 ENCOUNTER — Other Ambulatory Visit: Payer: Self-pay | Admitting: Surgery

## 2019-05-21 DIAGNOSIS — R748 Abnormal levels of other serum enzymes: Secondary | ICD-10-CM

## 2019-05-24 ENCOUNTER — Other Ambulatory Visit: Payer: Self-pay

## 2019-05-25 ENCOUNTER — Other Ambulatory Visit: Payer: Self-pay

## 2019-05-25 ENCOUNTER — Encounter: Payer: Self-pay | Admitting: Internal Medicine

## 2019-05-25 ENCOUNTER — Encounter (INDEPENDENT_AMBULATORY_CARE_PROVIDER_SITE_OTHER): Payer: Self-pay

## 2019-05-25 ENCOUNTER — Inpatient Hospital Stay: Payer: PPO | Attending: Internal Medicine | Admitting: Internal Medicine

## 2019-05-25 DIAGNOSIS — Z7984 Long term (current) use of oral hypoglycemic drugs: Secondary | ICD-10-CM | POA: Insufficient documentation

## 2019-05-25 DIAGNOSIS — I10 Essential (primary) hypertension: Secondary | ICD-10-CM | POA: Diagnosis not present

## 2019-05-25 DIAGNOSIS — Z79899 Other long term (current) drug therapy: Secondary | ICD-10-CM

## 2019-05-25 DIAGNOSIS — I2510 Atherosclerotic heart disease of native coronary artery without angina pectoris: Secondary | ICD-10-CM | POA: Diagnosis not present

## 2019-05-25 DIAGNOSIS — Z7982 Long term (current) use of aspirin: Secondary | ICD-10-CM | POA: Diagnosis not present

## 2019-05-25 DIAGNOSIS — J449 Chronic obstructive pulmonary disease, unspecified: Secondary | ICD-10-CM | POA: Diagnosis not present

## 2019-05-25 DIAGNOSIS — K219 Gastro-esophageal reflux disease without esophagitis: Secondary | ICD-10-CM | POA: Diagnosis not present

## 2019-05-25 DIAGNOSIS — C182 Malignant neoplasm of ascending colon: Secondary | ICD-10-CM | POA: Insufficient documentation

## 2019-05-25 DIAGNOSIS — E559 Vitamin D deficiency, unspecified: Secondary | ICD-10-CM

## 2019-05-25 DIAGNOSIS — M129 Arthropathy, unspecified: Secondary | ICD-10-CM | POA: Insufficient documentation

## 2019-05-25 DIAGNOSIS — R970 Elevated carcinoembryonic antigen [CEA]: Secondary | ICD-10-CM | POA: Diagnosis not present

## 2019-05-25 DIAGNOSIS — E785 Hyperlipidemia, unspecified: Secondary | ICD-10-CM | POA: Diagnosis not present

## 2019-05-25 DIAGNOSIS — G4700 Insomnia, unspecified: Secondary | ICD-10-CM | POA: Diagnosis not present

## 2019-05-25 DIAGNOSIS — E1140 Type 2 diabetes mellitus with diabetic neuropathy, unspecified: Secondary | ICD-10-CM | POA: Diagnosis not present

## 2019-05-25 NOTE — Assessment & Plan Note (Addendum)
#  Colon cancer-of ascending colon- stage I.  Patient has overall excellent prognosis from the standpoint of colon cancer extremely low risk of recurrence-especially given presence of mismatch repair [see below].  However given the recently abnormal CEA [see below], patient awaiting CT scan on July 7.  Await above work-up.  #Abnormal tumor marker/CEA-dec 2019- 7; April 2020- 5.4; June "abnormal"/labs unavailable to me- work up pending.  Clinically suspect patient's abnormal CEA not related to current malignancy.  Question inflammatory.   #Microsatellite instability present/ MLH-1 mismatch.  Suggestive of sporadic rather than Lynch syndrome given the absence of significant family history; and also given of hyper-methylation of MLH-1promoter site.  Thank you Dr.Anna for allowing me to participate in the care of your pleasant patient. Please do not hesitate to contact me with questions or concerns in the interim.   # DISPOSITION:  # follow up TBD based on the results of CT scan.
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
## 2019-05-25 NOTE — Progress Notes (Signed)
Patient is unsure why she had been referred to the [facility] but is aware of colon cancer diagnosis.  Patient was found to have colon cancer during screening colonoscopy.

## 2019-06-07 ENCOUNTER — Ambulatory Visit
Admission: RE | Admit: 2019-06-07 | Discharge: 2019-06-07 | Disposition: A | Discharge status: Home / Self Care | Payer: PPO | Source: Ambulatory Visit | Attending: Surgery | Admitting: Surgery

## 2019-06-07 DIAGNOSIS — Z85038 Personal history of other malignant neoplasm of large intestine: Secondary | ICD-10-CM | POA: Diagnosis not present

## 2019-06-07 DIAGNOSIS — R748 Abnormal levels of other serum enzymes: Secondary | ICD-10-CM

## 2019-06-07 DIAGNOSIS — R590 Localized enlarged lymph nodes: Secondary | ICD-10-CM | POA: Diagnosis not present

## 2019-06-07 DIAGNOSIS — Z8542 Personal history of malignant neoplasm of other parts of uterus: Secondary | ICD-10-CM | POA: Diagnosis not present

## 2019-06-07 MED ORDER — IOPAMIDOL (ISOVUE-300) INJECTION 61%
125.0000 mL | Freq: Once | INTRAVENOUS | Status: AC | PRN
  Administered 2019-06-07: 125 mL via INTRAVENOUS

## 2019-06-14 ENCOUNTER — Telehealth: Payer: Self-pay | Admitting: *Deleted

## 2019-06-14 NOTE — Telephone Encounter (Signed)
Will call pt- tomorrow.  GB

## 2019-06-14 NOTE — Telephone Encounter (Signed)
Patient states she was told to call Dr Rogue Bussing when she got her CT results. She states this is about her lymph nodes [phone]

## 2019-06-19 ENCOUNTER — Other Ambulatory Visit: Payer: Self-pay | Admitting: Internal Medicine

## 2019-06-19 NOTE — Progress Notes (Signed)
Spoke to patient regarding the CT scan; pelvic adenopathy-stable over the last 1 year at least.  Suspect benign/low-grade lymphoma.  Patient anxious/will discuss at tumor conference.

## 2019-06-21 ENCOUNTER — Telehealth: Payer: Self-pay | Admitting: Internal Medicine

## 2019-06-21 ENCOUNTER — Other Ambulatory Visit: Payer: PPO

## 2019-06-21 DIAGNOSIS — R590 Localized enlarged lymph nodes: Secondary | ICD-10-CM

## 2019-06-21 DIAGNOSIS — C182 Malignant neoplasm of ascending colon: Secondary | ICD-10-CM

## 2019-06-21 NOTE — Progress Notes (Signed)
Tumor Board Documentation  Cindy Griffin was presented by Dr Rogue Bussing at our Tumor Board on 06/21/2019, which included representatives from medical oncology, radiation oncology, radiology, pathology, surgical, surgical oncology, navigation, internal medicine, research.  Cindy Griffin currently presents as a new patient, for discussion with history of the following treatments: active survellience.  Additionally, we reviewed previous medical and familial history, history of present illness, and recent lab results along with all available histopathologic and imaging studies. The tumor board considered available treatment options and made the following recommendations: Active surveillance Stable Lymph node  The following procedures/referrals were also placed: No orders of the defined types were placed in this encounter.   Clinical Trial Status: not discussed   Staging used: Not Applicable  National site-specific guidelines   were discussed with respect to the case.  Tumor board is a meeting of clinicians from various specialty areas who evaluate and discuss patients for whom a multidisciplinary approach is being considered. Final determinations in the plan of care are those of the provider(s). The responsibility for follow up of recommendations given during tumor board is that of the provider.   Today's extended care, comprehensive team conference, Cindy Griffin was not present for the discussion and was not examined.   Multidisciplinary Tumor Board is a multidisciplinary case peer review process.  Decisions discussed in the Multidisciplinary Tumor Board reflect the opinions of the specialists present at the conference without having examined the patient.  Ultimately, treatment and diagnostic decisions rest with the primary provider(s) and the patient.

## 2019-06-21 NOTE — Telephone Encounter (Signed)
Reviewed at tumor conference; recommend follow up CT Ab/Plevis in 6 months.  I spoke to pt of the recommendations; in agreement.   Please schedule- in 6 months/MD- cbc/cmp/cea; CT prior- Dr.B

## 2019-06-22 NOTE — Addendum Note (Signed)
Addended by: Sabino Gasser on: 06/22/2019 10:22 AM   Modules accepted: Orders

## 2019-06-25 ENCOUNTER — Ambulatory Visit: Payer: PPO | Admitting: Gastroenterology

## 2019-06-26 ENCOUNTER — Encounter: Payer: Self-pay | Admitting: Gastroenterology

## 2019-06-26 ENCOUNTER — Ambulatory Visit (INDEPENDENT_AMBULATORY_CARE_PROVIDER_SITE_OTHER): Payer: PPO | Admitting: Gastroenterology

## 2019-06-26 ENCOUNTER — Other Ambulatory Visit: Payer: Self-pay

## 2019-06-26 VITALS — BP 113/71 | HR 73 | Temp 98.1°F | Ht 64.0 in | Wt 235.6 lb

## 2019-06-26 DIAGNOSIS — C183 Malignant neoplasm of hepatic flexure: Secondary | ICD-10-CM | POA: Diagnosis not present

## 2019-06-26 NOTE — Progress Notes (Signed)
Cindy Bellows MD, MRCP(U.K) [address]  [place]  [place], [place] [address]  Main: [phone]  Fax: [phone]   Primary Care Physician: Steele Sizer, MD  Primary Gastroenterologist:  Dr. Jonathon Griffin   History of colon cancer  HPI: Cindy Griffin is a 73 y.o. female    Summary of history :  I performed her colonoscopy on 03/13/18 for surveillance due to prior history of colon polyps.  I also did note a20 mm polypoid lesion was found at the hepatic flexure.  Pathology confirmedLEAST HIGH GRADE DYSPLASIA ARISING IN A TUBULAR ADENOMA WITH ULCERATION.She underwent Rt hemicolectomy on 05/11/18 : pathology showedINVASIVE ADENOCARCINOMA, WELL-DIFFERENTIATED, SPANNING 1.1 CM. TUMOR INVADES INTO SUPERFICIAL SUBMUCOSA. RESECTION MARGINS ARE NEGATIVE. TWENTY-ONE OF TWENTY-ONE LYMPH NODES NEGATIVE FOR CARCINOMA (0/21).  Post op course complicated by ileus. CT chest/abdomen showed no metastasis.  11/2018 : surveillance colonoscopy showed 2 diminutive polyps that were resected. They were tubular adenomas. No recurrence.  Interval history 05/03/2019-06/26/2019 Seen Dr. Rogue Bussing on 05/25/2019 for elevated CEA, subsequently underwent a CT scan of the abdomen on 06/07/2019 demonstrated an enlarged left iliac lymph node 2.6 x 1.2 cm no evidence of metastatic disease in the chest abdomen or pelvis.  Subsequently plan was to repeat follow-up CT scan in 6 months.   Current Outpatient Medications  Medication Sig Dispense Refill   acetaminophen (TYLENOL) 500 MG tablet Take 1,000 mg by mouth every 6 (six) hours as needed for moderate pain or headache.      Ascorbic Acid (VITAMIN C) 1000 MG tablet Take 1,000 mg by mouth daily.     aspirin EC 81 MG tablet Take 1 tablet (81 mg total) by mouth daily. 90 tablet 1   atorvastatin (LIPITOR) 80 MG tablet Take 1 tablet by mouth once daily 90 tablet 0   Carboxymethylcellul-Glycerin (LUBRICATING EYE DROPS OP) Place 2 drops into both eyes 2  (two) times daily as needed (for dry eyes).     cetirizine (ZYRTEC) 10 MG tablet Take 10 mg by mouth daily as needed for allergies.      Cholecalciferol (VITAMIN D-3) 1000 units CAPS Take 1,000 Units by mouth daily.      famotidine (PEPCID) 20 MG tablet TAKE 1 TABLET BY MOUTH TWICE DAILY IN  PLACE  OF  RANITIDINE 180 tablet 0   fluticasone (FLONASE) 50 MCG/ACT nasal spray USE TWO SPRAY IN EACH NOSTRIL ONCE DAILY 16 g 0   glipiZIDE (GLUCOTROL XL) 10 MG 24 hr tablet Take 1 tablet (10 mg total) by mouth daily with breakfast. 90 tablet 1   glucose blood (ADVOCATE REDI-CODE) test strip 1 each by Other route QID. 100 each 12   hydrochlorothiazide (HYDRODIURIL) 25 MG tablet Take 1 tablet (25 mg total) by mouth daily. 90 tablet 1   Lancets (ONETOUCH DELICA PLUS [id number]) MISC CHECK GLUCOSE THREE TIMES DAILY     lisinopril (PRINIVIL,ZESTRIL) 40 MG tablet Take 1 tablet (40 mg total) by mouth at bedtime. 90 tablet 1   meclizine (ANTIVERT) 25 MG tablet Take 1 tablet (25 mg total) by mouth every 6 (six) hours. 20 tablet 0   metFORMIN (GLUCOPHAGE-XR) 750 MG 24 hr tablet Take 2 tablets (1,500 mg total) by mouth daily with breakfast. 180 tablet 1   metoCLOPramide (REGLAN) 10 MG tablet Take 1 tablet (10 mg total) by mouth 4 (four) times daily -  before meals and at bedtime. (Patient not taking: Reported on 05/03/2019) 270 tablet 1   Multiple Vitamins-Minerals (MULTIVITAMIN PO) Take 1 tablet by  mouth daily.     Omega-3 Fatty Acids (OMEGA-3 CF PO) Take 660 mg by mouth daily. Over the counter product     pioglitazone (ACTOS) 15 MG tablet Take 1 tablet (15 mg total) by mouth daily. 90 tablet 1   PROAIR HFA 108 (90 Base) MCG/ACT inhaler INHALE 2 PUFFS INTO LUNGS EVERY 6 HOURS AS NEEDED FOR WHEEZING OR SHORTNESS OF BREATH 9 each 0   traMADol (ULTRAM) 50 MG tablet TAKE ONE HALF TO ONE TABLET BY MOUTH TWICE DAILY AS NEEDED  3   umeclidinium-vilanterol (ANORO ELLIPTA) 62.5-25 MCG/INH AEPB Inhale 1 puff into  the lungs daily. 60 each 5   No current facility-administered medications for this visit.     Allergies as of 06/26/2019   (No Known Allergies)    ROS:  General: Negative for anorexia, weight loss, fever, chills, fatigue, weakness. ENT: Negative for hoarseness, difficulty swallowing , nasal congestion. CV: Negative for chest pain, angina, palpitations, dyspnea on exertion, peripheral edema.  Respiratory: Negative for dyspnea at rest, dyspnea on exertion, cough, sputum, wheezing.  GI: See history of present illness. GU:  Negative for dysuria, hematuria, urinary incontinence, urinary frequency, nocturnal urination.  Endo: Negative for unusual weight change.    Physical Examination:   There were no vitals taken for this visit.  General: Well-nourished, well-developed in no acute distress.  Eyes: No icterus. Conjunctivae pink. Mouth: Oropharyngeal mucosa moist and pink , no lesions erythema or exudate. Lungs: Clear to auscultation bilaterally. Non-labored. Heart: Regular rate and rhythm, no murmurs rubs or gallops.  Abdomen: Bowel sounds are normal, nontender, nondistended, no hepatosplenomegaly or masses, no abdominal bruits or hernia , no rebound or guarding.   Extremities: No lower extremity edema. No clubbing or deformities. Neuro: Alert and oriented x 3.  Grossly intact. Skin: Warm and dry, no jaundice.   Psych: Alert and cooperative, normal mood and affect.   Imaging Studies: Ct Chest W Contrast  Result Date: 06/07/2019 CLINICAL DATA:  History of colon cancer status post resection, uterine cancer EXAM: CT CHEST, ABDOMEN, AND PELVIS WITH CONTRAST TECHNIQUE: Multidetector CT imaging of the chest, abdomen and pelvis was performed following the standard protocol during bolus administration of intravenous contrast. CONTRAST:  165mL ISOVUE-300 IOPAMIDOL (ISOVUE-300) INJECTION 61%, additional oral enteric contrast COMPARISON:  12/12/2018, 05/25/2018 FINDINGS: CT CHEST FINDINGS  Cardiovascular: No significant vascular findings. Normal heart size. No pericardial effusion. Mediastinum/Nodes: No enlarged mediastinal, hilar, or axillary lymph nodes. Unchanged prominent 9 mm right epicardial lymph node (series 2, image 43). Small unchanged thyroid nodules. Trachea, and esophagus demonstrate no significant findings. Lungs/Pleura: Minimal scarring. No pleural effusion or pneumothorax. Musculoskeletal: No chest wall mass or suspicious bone lesions identified. CT ABDOMEN PELVIS FINDINGS Hepatobiliary: No solid liver abnormality is seen. No gallstones, gallbladder wall thickening, or biliary dilatation. Pancreas: Unremarkable. No pancreatic ductal dilatation or surrounding inflammatory changes. Spleen: Normal in size without significant abnormality. Adrenals/Urinary Tract: Adrenal glands are unremarkable. Kidneys are normal, without renal calculi, solid lesion, or hydronephrosis. Bladder is unremarkable. Stomach/Bowel: Stomach is within normal limits. No evidence of bowel wall thickening, distention, or inflammatory changes. Status post right colectomy. Descending and sigmoid diverticulosis. Vascular/Lymphatic: No significant vascular findings are present. Unchanged enlarged left iliac lymph node measuring 2.6 x 1.2 cm (series 2, image 105). Reproductive: Status post hysterectomy. Other: No abdominal wall hernia or abnormality. No abdominopelvic ascites. Musculoskeletal: No acute or significant osseous findings. IMPRESSION: 1. Redemonstrated postoperative findings of right colectomy and hysterectomy. 2. Unchanged enlarged left iliac lymph node measuring 2.6  x 1.2 cm (series 2, image 105). Attention on follow-up. 3. No definite evidence of metastatic disease in the chest, abdomen, or pelvis. 4.  Other chronic and incidental findings as detailed above. Electronically Signed   By: Eddie Candle M.D.   On: 06/07/2019 14:22   Ct Abdomen Pelvis W Contrast  Result Date: 06/07/2019 CLINICAL DATA:  History  of colon cancer status post resection, uterine cancer EXAM: CT CHEST, ABDOMEN, AND PELVIS WITH CONTRAST TECHNIQUE: Multidetector CT imaging of the chest, abdomen and pelvis was performed following the standard protocol during bolus administration of intravenous contrast. CONTRAST:  153mL ISOVUE-300 IOPAMIDOL (ISOVUE-300) INJECTION 61%, additional oral enteric contrast COMPARISON:  12/12/2018, 05/25/2018 FINDINGS: CT CHEST FINDINGS Cardiovascular: No significant vascular findings. Normal heart size. No pericardial effusion. Mediastinum/Nodes: No enlarged mediastinal, hilar, or axillary lymph nodes. Unchanged prominent 9 mm right epicardial lymph node (series 2, image 43). Small unchanged thyroid nodules. Trachea, and esophagus demonstrate no significant findings. Lungs/Pleura: Minimal scarring. No pleural effusion or pneumothorax. Musculoskeletal: No chest wall mass or suspicious bone lesions identified. CT ABDOMEN PELVIS FINDINGS Hepatobiliary: No solid liver abnormality is seen. No gallstones, gallbladder wall thickening, or biliary dilatation. Pancreas: Unremarkable. No pancreatic ductal dilatation or surrounding inflammatory changes. Spleen: Normal in size without significant abnormality. Adrenals/Urinary Tract: Adrenal glands are unremarkable. Kidneys are normal, without renal calculi, solid lesion, or hydronephrosis. Bladder is unremarkable. Stomach/Bowel: Stomach is within normal limits. No evidence of bowel wall thickening, distention, or inflammatory changes. Status post right colectomy. Descending and sigmoid diverticulosis. Vascular/Lymphatic: No significant vascular findings are present. Unchanged enlarged left iliac lymph node measuring 2.6 x 1.2 cm (series 2, image 105). Reproductive: Status post hysterectomy. Other: No abdominal wall hernia or abnormality. No abdominopelvic ascites. Musculoskeletal: No acute or significant osseous findings. IMPRESSION: 1. Redemonstrated postoperative findings of right  colectomy and hysterectomy. 2. Unchanged enlarged left iliac lymph node measuring 2.6 x 1.2 cm (series 2, image 105). Attention on follow-up. 3. No definite evidence of metastatic disease in the chest, abdomen, or pelvis. 4.  Other chronic and incidental findings as detailed above. Electronically Signed   By: Eddie Candle M.D.   On: 06/07/2019 14:22    Assessment and Plan:   GILDA ABBOUD is a 73 y.o. y/o female here to follow uplarge ulcerated polyp seen on index colonoscopy , referred for surgery and found to have invasive adenocarcinoma, node negative, margins clear. T1N0.Colonoscopy in 11/2018 showed no recurrence.  Post surgery slightly elevated CEA level and subsequently was referred to Dr. Rogue Bussing.  She was discussed at the tumor board, repeat CT scan of the abdomen showed no clear evidence of metastatic disease but a single enlarged lymph node.  Plan has been per oncology to repeat CT scan in 6 months time.  From the GI point of view I do not have any other suggestion.    Dr Cindy Bellows  MD,MRCP [facility]) Follow up in as needed

## 2019-06-29 ENCOUNTER — Ambulatory Visit: Payer: Self-pay

## 2019-07-02 ENCOUNTER — Ambulatory Visit: Payer: PPO | Admitting: Family Medicine

## 2019-07-03 ENCOUNTER — Ambulatory Visit (INDEPENDENT_AMBULATORY_CARE_PROVIDER_SITE_OTHER): Payer: PPO

## 2019-07-03 ENCOUNTER — Other Ambulatory Visit: Payer: Self-pay

## 2019-07-03 ENCOUNTER — Encounter: Payer: Self-pay | Admitting: Family Medicine

## 2019-07-03 ENCOUNTER — Ambulatory Visit (INDEPENDENT_AMBULATORY_CARE_PROVIDER_SITE_OTHER): Payer: PPO | Admitting: Family Medicine

## 2019-07-03 VITALS — BP 122/68 | HR 89 | Temp 98.2°F | Resp 16 | Ht 64.0 in | Wt 237.2 lb

## 2019-07-03 DIAGNOSIS — E1342 Other specified diabetes mellitus with diabetic polyneuropathy: Secondary | ICD-10-CM

## 2019-07-03 DIAGNOSIS — E785 Hyperlipidemia, unspecified: Secondary | ICD-10-CM | POA: Diagnosis not present

## 2019-07-03 DIAGNOSIS — I10 Essential (primary) hypertension: Secondary | ICD-10-CM | POA: Diagnosis not present

## 2019-07-03 DIAGNOSIS — E1129 Type 2 diabetes mellitus with other diabetic kidney complication: Secondary | ICD-10-CM | POA: Diagnosis not present

## 2019-07-03 DIAGNOSIS — IMO0002 Reserved for concepts with insufficient information to code with codable children: Secondary | ICD-10-CM

## 2019-07-03 DIAGNOSIS — Z780 Asymptomatic menopausal state: Secondary | ICD-10-CM

## 2019-07-03 DIAGNOSIS — Z0000 Encounter for general adult medical examination without abnormal findings: Secondary | ICD-10-CM

## 2019-07-03 DIAGNOSIS — E1169 Type 2 diabetes mellitus with other specified complication: Secondary | ICD-10-CM

## 2019-07-03 DIAGNOSIS — E1165 Type 2 diabetes mellitus with hyperglycemia: Secondary | ICD-10-CM

## 2019-07-03 DIAGNOSIS — J411 Mucopurulent chronic bronchitis: Secondary | ICD-10-CM | POA: Diagnosis not present

## 2019-07-03 DIAGNOSIS — E538 Deficiency of other specified B group vitamins: Secondary | ICD-10-CM

## 2019-07-03 DIAGNOSIS — Z1231 Encounter for screening mammogram for malignant neoplasm of breast: Secondary | ICD-10-CM

## 2019-07-03 DIAGNOSIS — D649 Anemia, unspecified: Secondary | ICD-10-CM | POA: Diagnosis not present

## 2019-07-03 DIAGNOSIS — E559 Vitamin D deficiency, unspecified: Secondary | ICD-10-CM | POA: Diagnosis not present

## 2019-07-03 LAB — COMPLETE METABOLIC PANEL WITH GFR
AG Ratio: 1.6 (calc) (ref 1.0–2.5)
ALT: 15 U/L (ref 6–29)
AST: 17 U/L (ref 10–35)
Albumin: 4.6 g/dL (ref 3.6–5.1)
Alkaline phosphatase (APISO): 86 U/L (ref 37–153)
BUN: 18 mg/dL (ref 7–25)
CO2: 29 mmol/L (ref 20–32)
Calcium: 10.1 mg/dL (ref 8.6–10.4)
Chloride: 104 mmol/L (ref 98–110)
Creat: 0.76 mg/dL (ref 0.60–0.93)
GFR, Est African American: 90 mL/min/{1.73_m2} (ref >=60)
GFR, Est Non African American: 78 mL/min/{1.73_m2} (ref >=60)
Globulin: 2.8 g/dL (calc) (ref 1.9–3.7)
Glucose, Bld: 122 mg/dL — ABNORMAL HIGH (ref 65–99)
Potassium: 5 mmol/L (ref 3.5–5.3)
Sodium: 141 mmol/L (ref 135–146)
Total Bilirubin: 0.3 mg/dL (ref 0.2–1.2)
Total Protein: 7.4 g/dL (ref 6.1–8.1)

## 2019-07-03 LAB — POCT GLYCOSYLATED HEMOGLOBIN (HGB A1C): HbA1c, POC (controlled diabetic range): 9.3 % — AB (ref 0.0–7.0)

## 2019-07-03 LAB — CBC WITH DIFFERENTIAL/PLATELET
Absolute Monocytes: 540 cells/uL (ref 200–950)
Basophils Absolute: 50 cells/uL (ref 0–200)
Basophils Relative: 0.7 %
Eosinophils Absolute: 122 cells/uL (ref 15–500)
Eosinophils Relative: 1.7 %
HCT: 37.6 % (ref 35.0–45.0)
Hemoglobin: 12.5 g/dL (ref 11.7–15.5)
Lymphs Abs: 2167 cells/uL (ref 850–3900)
MCH: 28.4 pg (ref 27.0–33.0)
MCHC: 33.2 g/dL (ref 32.0–36.0)
MCV: 85.5 fL (ref 80.0–100.0)
MPV: 10.3 fL (ref 7.5–12.5)
Monocytes Relative: 7.5 %
Neutro Abs: 4320 cells/uL (ref 1500–7800)
Neutrophils Relative %: 60 %
Platelets: 304 10*3/uL (ref 140–400)
RBC: 4.4 10*6/uL (ref 3.80–5.10)
RDW: 14.3 % (ref 11.0–15.0)
Total Lymphocyte: 30.1 %
WBC: 7.2 10*3/uL (ref 3.8–10.8)

## 2019-07-03 MED ORDER — PREGABALIN 75 MG PO CAPS: 75.0000 mg | ORAL_CAPSULE | Freq: Two times a day (BID) | ORAL | 0 refills | Status: DC

## 2019-07-03 NOTE — Progress Notes (Signed)
Name: Cindy Griffin   MRN: [id number]    DOB: [birth year]   Date:07/03/2019       Progress Note  Subjective  Chief Complaint  Chief Complaint  Patient presents with  . Medication Refill  . Diabetes    Took last dose of Ozempic yesterday  . Hypertension  . Hyperlipidemia  . Insomnia  . Morbid Obesity  . Gastroesophageal Reflux  . Depression  . Leg Pain    Bilateral leg pain-peripheral neuropathy    HPI  Paresthesia: she states symptoms started a few months ago, she states on both legs, worse with rest, such as watching TV or going to bed, shooting sensation down her legs  DMII with nephropathy: she is cannot afford brand medications. We tried Synjardi and Victoza but had to stop because of cost. She is currently on Glipizide XL10mg  and Metformin 1500 mg daily, Actos 15 mg, and glucose is trending up. We changed to Ozempic in Dec 2018 she is tolerating medication, however ran out of medication for about 2 months secondary to cost.HbgA1C from 8.1% to 8.5%,9.0%8.0% 7.9%, 7.3% it was down to 6.9% on her last visit but today is up to 9.3% she is getting food shipped to her since COVID-19 and having rice and other starches, drinking juice with her meals,  she states she is walking again since the mallShe denies polyphagia, polydipsia or polyuria. She also has gastroparesis and is now on reglan. Now having symptoms of peripheral neuropathy and we will try lyrica .  She is on ACE  for kidney protection   HTN: taking medication. No chest pain or palpitation, bp is at goal.  She has intermittent episodes of vertigo   History of colon cancer: very early recesction pT1N0( 0/21 lymphonod) , . She had a CEA that was 7 back in December and had it repeated recently but I cannot see results, advised her to call Dr. Dema Severin  . Dr. Vicente Males did last colonoscopy 11/2018 that showed to small polyps , she has regular follow ups  Hyperlipidemia: taking Atorvastatin and denies side effects of  medications.Reviewed last labs  Morbid Obesity: she tried Contrave but made her vomit. We started her on Belvig in April 2017 and she lost weight, she was down 22 lbs by June when she had shoulder surgery, but she stopped exercising and taking medication and hadgained 12lbs post-surgery, but stopped Belviq because of cost. Weightwasdown to 227 lbs She ran out of Ozempic, gained 5 lbs since last visit   Chronic Bronchitis: she has a chronic productivecoughno recent episodes of flaresthat required steroid. Stable, SOB with activity, taking Anoro. No wheezing   Major Depression: She is still has symptoms but does not want to take medications for depression, or sleep. States doing better but still worried about her daughter. Long history of depression, but refuses medication. No suicidal thoughts or ideation. Feels lonely at times but doing well   Patient Active Problem List   Diagnosis Date Noted  . Colon cancer, ascending ([place]) 05/25/2019  . Mild episode of recurrent major depressive disorder ([place]) 08/01/2018  . SBO (small bowel obstruction) and ileocolonic anastomosis from recent colectomy 05/22/2018  . Cancer of ascending colon - pT1N0 (0/21) s/p lap right colectomy 05/11/2018 05/21/2018  . Hypomagnesemia 05/21/2018  . Ileus following gastrointestinal surgery ([place]) 05/20/2018  . Essential hemorrhagic thrombocythemia ([place]) 03/29/2018  . Hypercalcemia 11/23/2015  . DDD (degenerative disc disease), lumbar 07/28/2015  . Spinal stenosis at L4-L5 level 06/12/2015  . Benign essential HTN 05/25/2015  .  Bunion 05/25/2015  . Cardiac enlargement 05/25/2015  . Cervical radicular pain 05/25/2015  . Back pain, chronic 05/25/2015  . Osteoarthritis 05/25/2015  . Dyslipidemia 05/25/2015  . Gastric reflux 05/25/2015  . Insomnia 05/25/2015  . Eczema intertrigo 05/25/2015  . Bronchitis, chronic, mucopurulent ([place]) 05/25/2015  . Numerous moles 05/25/2015  . Morbid obesity ([place]) 05/25/2015  .  Perennial allergic rhinitis 05/25/2015  . DM (diabetes mellitus), type 2, uncontrolled, with renal complications ([place]) 62/83/6629  . Snores 05/25/2015  . History of artificial joint 05/25/2015  . Degeneration of intervertebral disc of cervical region 02/06/2015  . Neuritis or radiculitis due to rupture of lumbar intervertebral disc 01/14/2015  . Abnormal presence of protein in urine 05/04/2010  . Vitamin D deficiency 05/04/2010    Past Surgical History:  Procedure Laterality Date  . ABDOMINAL HYSTERECTOMY     due to cancer-partial  . BREAST BIOPSY Right    Benign  . COLONOSCOPY N/A 04/11/2015   Procedure: COLONOSCOPY;  Surgeon: Hulen Luster, MD;  Location: [facility] ENDOSCOPY;  Service: Gastroenterology;  Laterality: N/A;  . COLONOSCOPY WITH PROPOFOL N/A 03/13/2018   Procedure: COLONOSCOPY WITH PROPOFOL;  Surgeon: [name], MD;  Location: [facility] ENDOSCOPY;  Service: Gastroenterology;  Laterality: N/A;  . COLONOSCOPY WITH PROPOFOL N/A 12/25/2018   Procedure: COLONOSCOPY WITH PROPOFOL;  Surgeon: [name], MD;  Location: [facility] ENDOSCOPY;  Service: Gastroenterology;  Laterality: N/A;  . JOINT REPLACEMENT     left knee x3,  right knee 2005  . LAPAROSCOPIC RIGHT COLECTOMY Right 05/11/2018   Procedure: LAPAROSCOPIC RIGHT HEMICOLECTOMY ERAS PATHWAY;  Surgeon: Ileana Roup, MD;  Location: [facility] ORS;  Service: General;  Laterality: Right;  . SHOULDER ARTHROSCOPY WITH OPEN ROTATOR CUFF REPAIR Left 05/18/2016   Procedure: SHOULDER ARTHROSCOPY WITH OPEN ROTATOR CUFF REPAIR,distal clavicle excision, decompression;  Surgeon: Corky Mull, MD;  Location: [facility] ORS;  Service: Orthopedics;  Laterality: Left;  . SPINAL FUSION  1994   C-Spine  . Transforaminal Epidural  02/20/2015   Injection into cervical spine- C5-6 Dr. Phyllis Ginger    Family History  Problem Relation Age of Onset  . Diabetes Mother   . Kidney disease Mother   . Hypertension Mother   . Healthy Father   . Asthma Daughter   . Diabetes  Sister   . Kidney disease Sister   . Pancreatic cancer Sister   . Diabetes Brother     Social History   Socioeconomic History  . Marital status: Widowed    Spouse name: Jeneen Rinks  . Number of children: 4  . Years of education: some college  . Highest education level: 12th grade  Occupational History  . Occupation: Retired  Scientific laboratory technician  . Financial resource strain: Not hard at all  . Food insecurity    Worry: Never true    Inability: Never true  . Transportation needs    Medical: No    Non-medical: No  Tobacco Use  . Smoking status: Never Smoker  . Smokeless tobacco: Never Used  . Tobacco comment: smoking cessation materials not required  Substance and Sexual Activity  . Alcohol use: Not Currently    Alcohol/week: 0.0 standard drinks  . Drug use: No  . Sexual activity: Not Currently    Birth control/protection: Surgical  Lifestyle  . Physical activity    Days per week: 0 days    Minutes per session: 0 min  . Stress: Only a little  Relationships  . Social connections    Talks on phone: More than three  times a week    Gets together: More than three times a week    Attends religious service: More than 4 times per year    Active member of club or organization: Yes    Attends meetings of clubs or organizations: More than 4 times per year    Relationship status: Widowed  . Intimate partner violence    Fear of current or ex partner: No    Emotionally abused: No    Physically abused: No    Forced sexual activity: No  Other Topics Concern  . Not on file  Social History Narrative   Lives in [place]; self; no smoked; rare alcohol; retd. CNA/senor labs tech      Sons x3; daughter- 30.      Current Outpatient Medications:  .  acetaminophen (TYLENOL) 500 MG tablet, Take 1,000 mg by mouth every 6 (six) hours as needed for moderate pain or headache. , Disp: , Rfl:  .  Ascorbic Acid (VITAMIN C) 1000 MG tablet, Take 1,000 mg by mouth daily., Disp: , Rfl:  .  aspirin EC 81  MG tablet, Take 1 tablet (81 mg total) by mouth daily., Disp: 90 tablet, Rfl: 1 .  atorvastatin (LIPITOR) 80 MG tablet, Take 1 tablet by mouth once daily, Disp: 90 tablet, Rfl: 0 .  Carboxymethylcellul-Glycerin (LUBRICATING EYE DROPS OP), Place 2 drops into both eyes 2 (two) times daily as needed (for dry eyes)., Disp: , Rfl:  .  cetirizine (ZYRTEC) 10 MG tablet, Take 10 mg by mouth daily as needed for allergies. , Disp: , Rfl:  .  Cholecalciferol (VITAMIN D-3) 1000 units CAPS, Take 1,000 Units by mouth daily. , Disp: , Rfl:  .  famotidine (PEPCID) 20 MG tablet, TAKE 1 TABLET BY MOUTH TWICE DAILY IN  PLACE  OF  RANITIDINE, Disp: 180 tablet, Rfl: 0 .  fluticasone (FLONASE) 50 MCG/ACT nasal spray, USE TWO SPRAY IN EACH NOSTRIL ONCE DAILY, Disp: 16 g, Rfl: 0 .  glipiZIDE (GLUCOTROL XL) 10 MG 24 hr tablet, Take 1 tablet (10 mg total) by mouth daily with breakfast., Disp: 90 tablet, Rfl: 1 .  hydrochlorothiazide (HYDRODIURIL) 25 MG tablet, Take 1 tablet (25 mg total) by mouth daily., Disp: 90 tablet, Rfl: 1 .  Lancets (ONETOUCH DELICA PLUS [id number]) MISC, CHECK GLUCOSE THREE TIMES DAILY, Disp: , Rfl:  .  lisinopril (PRINIVIL,ZESTRIL) 40 MG tablet, Take 1 tablet (40 mg total) by mouth at bedtime., Disp: 90 tablet, Rfl: 1 .  meclizine (ANTIVERT) 25 MG tablet, Take 1 tablet (25 mg total) by mouth every 6 (six) hours., Disp: 20 tablet, Rfl: 0 .  metFORMIN (GLUCOPHAGE-XR) 750 MG 24 hr tablet, Take 2 tablets (1,500 mg total) by mouth daily with breakfast. (Patient taking differently: Take 1,500 mg by mouth daily with breakfast. Pt takes one tablet daily with breakfast), Disp: 180 tablet, Rfl: 1 .  Multiple Vitamins-Minerals (MULTIVITAMIN PO), Take 1 tablet by mouth daily., Disp: , Rfl:  .  Omega-3 Fatty Acids (OMEGA-3 CF PO), Take 660 mg by mouth daily. Over the counter product, Disp: , Rfl:  .  pioglitazone (ACTOS) 15 MG tablet, Take 1 tablet (15 mg total) by mouth daily., Disp: 90 tablet, Rfl: 1 .  PROAIR  HFA 108 (90 Base) MCG/ACT inhaler, INHALE 2 PUFFS INTO LUNGS EVERY 6 HOURS AS NEEDED FOR WHEEZING OR SHORTNESS OF BREATH, Disp: 9 each, Rfl: 0 .  traMADol (ULTRAM) 50 MG tablet, TAKE ONE HALF TO ONE TABLET BY MOUTH TWICE DAILY AS NEEDED,  Disp: , Rfl: 3 .  umeclidinium-vilanterol (ANORO ELLIPTA) 62.5-25 MCG/INH AEPB, Inhale 1 puff into the lungs daily., Disp: 60 each, Rfl: 5  No Known Allergies  I personally reviewed active problem list, medication list, allergies, family history, social history with the patient/caregiver today.   ROS  Constitutional: Negative for fever, positive for  weight change.  Respiratory: Positiive  for chronic cough and shortness of breath.   Cardiovascular: Negative for chest pain or palpitations.  Gastrointestinal: Negative for abdominal pain, no bowel changes.  Musculoskeletal: Positive  for gait problem or joint swelling.  Skin: Negative for rash.  Neurological: Negative for dizziness or headache.  No other specific complaints in a complete review of systems (except as listed in HPI above).   Objective  Vitals:   07/03/19 0859  BP: 122/68  Pulse: 89  Resp: 16  Temp: 98.2 F (36.8 C)  TempSrc: Temporal  SpO2: 97%  Weight: 237 lb 3.2 oz (107.6 kg)  Height: 5\' 4"  (1.626 m)    Body mass index is 40.72 kg/m.  Physical Exam  Constitutional: Patient appears well-developed and well-nourished. Obese  No distress.  HEENT: head atraumatic, normocephalic, pupils equal and reactive to light,  neck supple Cardiovascular: Normal rate, regular rhythm and normal heart sounds.  No murmur heard. No BLE edema. Pulmonary/Chest: Effort normal and breath sounds normal. No respiratory distress. Abdominal: Soft.  There is no tenderness. Psychiatric: Patient has a normal mood and affect. behavior is normal. Judgment and thought content normal.  Recent Results (from the past 2160 hour(s))  POCT HgB A1C     Status: Abnormal   Collection Time: 07/03/19  9:19 AM   Result Value Ref Range   Hemoglobin A1C     HbA1c POC (<> result, manual entry)     HbA1c, POC (prediabetic range)     HbA1c, POC (controlled diabetic range) 9.3 (A) 0.0 - 7.0 %      PHQ2/9: Depression screen [facility] 2/9 07/03/2019 03/01/2019 10/31/2018 08/01/2018 06/06/2018  Decreased Interest 1 0 0 0 1  Down, Depressed, Hopeless 1 2 0 0 1  PHQ - 2 Score 2 2 0 0 2  Altered sleeping 3 2 3 3 1   Tired, decreased energy 3 1 3 3 1   Change in appetite 0 1 3 3 1   Feeling bad or failure about yourself  0 0 0 0 0  Trouble concentrating 0 0 1 1 0  Moving slowly or fidgety/restless 0 0 0 0 0  Suicidal thoughts 0 0 0 0 0  PHQ-9 Score 8 6 10 10 5   Difficult doing work/chores Not difficult at all Not difficult at all Somewhat difficult Somewhat difficult Not difficult at all    phq 9 is positive   Fall Risk: Fall Risk  07/03/2019 10/31/2018 08/01/2018 06/06/2018 03/29/2018  Falls in the past year? 0 0 No No No  Number falls in past yr: 0 0 - - -  Injury with Fall? 0 0 - - -  Comment - - - - -  Risk for fall due to : - - - - -  Risk for fall due to: Comment - - - - -  Follow up Falls prevention discussed - - - -     Assessment & Plan  1. DM (diabetes mellitus), type 2, uncontrolled, with renal complications (HCC)  - POCT HgB A1C - Ambulatory referral to Chronic Care Management Services  2. Secondary diabetes with peripheral neuropathy (HCC)  - pregabalin (LYRICA) 75 MG capsule; Take  1 capsule (75 mg total) by mouth 2 (two) times daily.  Dispense: 180 capsule; Refill: 0  3. Benign essential HTN  - COMPLETE METABOLIC PANEL WITH GFR  4. Morbid obesity ([place])  Discussed with the patient the risk posed by an increased BMI. Discussed importance of portion control, calorie counting and at least 150 minutes of physical activity weekly. Avoid sweet beverages and drink more water. Eat at least 6 servings of fruit and vegetables daily   5. Dyslipidemia associated with type 2 diabetes mellitus  ([place])  She needs chronic  Care management to assist with ozempic cost and also diet information   6. Vitamin D deficiency   7. Mucopurulent chronic bronchitis ([place])   8. B12 deficiency   9. Anemia, unspecified type  - CBC with Differential/Platelet
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
## 2019-07-03 NOTE — Patient Instructions (Signed)
[name] , Thank you for taking time to come for your Medicare Wellness Visit. I appreciate your ongoing commitment to your health goals. Please review the following plan we discussed and let me know if I can assist you in the future.   Screening recommendations/referrals: Colonoscopy: done 12/25/18 Mammogram: done 08/30/18. Please call [phone] to schedule your mammogram and bone density screening.  Bone Density: done 2011 Recommended yearly ophthalmology/optometry visit for glaucoma screening and checkup Recommended yearly dental visit for hygiene and checkup  Vaccinations: Influenza vaccine: done 08/01/18 Pneumococcal vaccine: done 08/29/15 Tdap vaccine: done 05/04/10 Shingles vaccine: Shingrix series completed 11/27/18    Conditions/risks identified: Recommend increasing physical activity as tolerated.   Next appointment: Please follow up in one year for your Medicare Annual Wellness visit.     Preventive Care 14 Years and Older, Female Preventive care refers to lifestyle choices and visits with your health care provider that can promote health and wellness. What does preventive care include?  A yearly physical exam. This is also called an annual well check.  Dental exams once or twice a year.  Routine eye exams. Ask your health care provider how often you should have your eyes checked.  Personal lifestyle choices, including:  Daily care of your teeth and gums.  Regular physical activity.  Eating a healthy diet.  Avoiding tobacco and drug use.  Limiting alcohol use.  Practicing safe sex.  Taking low-dose aspirin every day.  Taking vitamin and mineral supplements as recommended by your health care provider. What happens during an annual well check? The services and screenings done by your health care provider during your annual well check will depend on your age, overall health, lifestyle risk factors, and family history of disease. Counseling  Your health care  provider may ask you questions about your:  Alcohol use.  Tobacco use.  Drug use.  Emotional well-being.  Home and relationship well-being.  Sexual activity.  Eating habits.  History of falls.  Memory and ability to understand (cognition).  Work and work Statistician.  Reproductive health. Screening  You may have the following tests or measurements:  Height, weight, and BMI.  Blood pressure.  Lipid and cholesterol levels. These may be checked every 5 years, or more frequently if you are over 32 years old.  Skin check.  Lung cancer screening. You may have this screening every year starting at age 63 if you have a 30-pack-year history of smoking and currently smoke or have quit within the past 15 years.  Fecal occult blood test (FOBT) of the stool. You may have this test every year starting at age 28.  Flexible sigmoidoscopy or colonoscopy. You may have a sigmoidoscopy every 5 years or a colonoscopy every 10 years starting at age 73.  Hepatitis C blood test.  Hepatitis B blood test.  Sexually transmitted disease (STD) testing.  Diabetes screening. This is done by checking your blood sugar (glucose) after you have not eaten for a while (fasting). You may have this done every 1-3 years.  Bone density scan. This is done to screen for osteoporosis. You may have this done starting at age 74.  Mammogram. This may be done every 1-2 years. Talk to your health care provider about how often you should have regular mammograms. Talk with your health care provider about your test results, treatment options, and if necessary, the need for more tests. Vaccines  Your health care provider may recommend certain vaccines, such as:  Influenza vaccine. This is recommended every  year.  Tetanus, diphtheria, and acellular pertussis (Tdap, Td) vaccine. You may need a Td booster every 10 years.  Zoster vaccine. You may need this after age 33.  Pneumococcal 13-valent conjugate (PCV13)  vaccine. One dose is recommended after age 29.  Pneumococcal polysaccharide (PPSV23) vaccine. One dose is recommended after age 43. Talk to your health care provider about which screenings and vaccines you need and how often you need them. This information is not intended to replace advice given to you by your health care provider. Make sure you discuss any questions you have with your health care provider. Document Released: 12/12/2015 Document Revised: 08/04/2016 Document Reviewed: 09/16/2015 Elsevier Interactive Patient Education  2017 [employer] Prevention in the Home Falls can cause injuries. They can happen to people of all ages. There are many things you can do to make your home safe and to help prevent falls. What can I do on the outside of my home?  Regularly fix the edges of walkways and driveways and fix any cracks.  Remove anything that might make you trip as you walk through a door, such as a raised step or threshold.  Trim any bushes or trees on the path to your home.  Use bright outdoor lighting.  Clear any walking paths of anything that might make someone trip, such as rocks or tools.  Regularly check to see if handrails are loose or broken. Make sure that both sides of any steps have handrails.  Any raised decks and porches should have guardrails on the edges.  Have any leaves, snow, or ice cleared regularly.  Use sand or salt on walking paths during winter.  Clean up any spills in your garage right away. This includes oil or grease spills. What can I do in the bathroom?  Use night lights.  Install grab bars by the toilet and in the tub and shower. Do not use towel bars as grab bars.  Use non-skid mats or decals in the tub or shower.  If you need to sit down in the shower, use a plastic, non-slip stool.  Keep the floor dry. Clean up any water that spills on the floor as soon as it happens.  Remove soap buildup in the tub or shower regularly.   Attach bath mats securely with double-sided non-slip rug tape.  Do not have throw rugs and other things on the floor that can make you trip. What can I do in the bedroom?  Use night lights.  Make sure that you have a light by your bed that is easy to reach.  Do not use any sheets or blankets that are too big for your bed. They should not hang down onto the floor.  Have a firm chair that has side arms. You can use this for support while you get dressed.  Do not have throw rugs and other things on the floor that can make you trip. What can I do in the kitchen?  Clean up any spills right away.  Avoid walking on wet floors.  Keep items that you use a lot in easy-to-reach places.  If you need to reach something above you, use a strong step stool that has a grab bar.  Keep electrical cords out of the way.  Do not use floor polish or wax that makes floors slippery. If you must use wax, use non-skid floor wax.  Do not have throw rugs and other things on the floor that can make you trip. What can  I do with my stairs?  Do not leave any items on the stairs.  Make sure that there are handrails on both sides of the stairs and use them. Fix handrails that are broken or loose. Make sure that handrails are as long as the stairways.  Check any carpeting to make sure that it is firmly attached to the stairs. Fix any carpet that is loose or worn.  Avoid having throw rugs at the top or bottom of the stairs. If you do have throw rugs, attach them to the floor with carpet tape.  Make sure that you have a light switch at the top of the stairs and the bottom of the stairs. If you do not have them, ask someone to add them for you. What else can I do to help prevent falls?  Wear shoes that:  Do not have high heels.  Have rubber bottoms.  Are comfortable and fit you well.  Are closed at the toe. Do not wear sandals.  If you use a stepladder:  Make sure that it is fully opened. Do not climb  a closed stepladder.  Make sure that both sides of the stepladder are locked into place.  Ask someone to hold it for you, if possible.  Clearly mark and make sure that you can see:  Any grab bars or handrails.  First and last steps.  Where the edge of each step is.  Use tools that help you move around (mobility aids) if they are needed. These include:  Canes.  Walkers.  Scooters.  Crutches.  Turn on the lights when you go into a dark area. Replace any light bulbs as soon as they burn out.  Set up your furniture so you have a clear path. Avoid moving your furniture around.  If any of your floors are uneven, fix them.  If there are any pets around you, be aware of where they are.  Review your medicines with your doctor. Some medicines can make you feel dizzy. This can increase your chance of falling. Ask your doctor what other things that you can do to help prevent falls. This information is not intended to replace advice given to you by your health care provider. Make sure you discuss any questions you have with your health care provider. Document Released: 09/11/2009 Document Revised: 04/22/2016 Document Reviewed: 12/20/2014 Elsevier Interactive Patient Education  2017 [employer].

## 2019-07-04 ENCOUNTER — Ambulatory Visit: Payer: Self-pay

## 2019-07-04 DIAGNOSIS — E1129 Type 2 diabetes mellitus with other diabetic kidney complication: Secondary | ICD-10-CM

## 2019-07-04 DIAGNOSIS — IMO0002 Reserved for concepts with insufficient information to code with codable children: Secondary | ICD-10-CM

## 2019-07-04 NOTE — Patient Instructions (Addendum)
Thank you allowing the Chronic Care Management Team to be a part of your care! It was a pleasure speaking with you today!  1. Starting tomorrow, check your fasting blood sugar daily and record. We will review at out next encounter. 2. You need to exercise at least 5 days a week. Walk in the early mornings when it is cooler  3. PLEASE reduce your intake of carbohydrates (rice, pasta, bread, juice, fruits). 4. Take all your medications as prescribed. I will talk to Almyra Free and Dr. Ancil Boozer to see if there is another regimen for your diabetes. 5. Please review the diabetic booklet I gave you when I was visiting your home. It has all the information in it to help you be successful with managing your diabetes and reducing your A1C.  CCM (Chronic Care Management) Team   Trish Fountain RN, BSN Nurse Care Coordinator  [phone]  [name] PharmD  Clinical Pharmacist  [phone]   [place], [place] Social Worker [phone]  Goals Addressed            This Visit's Progress   . My A1C has increased to 9 (pt-stated)       Current Barriers:  [name] Knowledge Deficits related to basic Diabetes pathophysiology and self care/management . Lack of personal glucometer to monitor blood sugar . Non-compliance with DM self care adherence  Nurse Case Manager Clinical Goal(s):  [name] Over the next 14 days, patient will demonstrate improved adherence to prescribed treatment plan for diabetes self care/management as evidenced by checking blood glucose levels as prescribed, adhering to ADA/low carb diet, daily exercise, and medication adherence  Interventions:  . Reinforced DM education including importance of medication compliance, following ADA diet, checking blood glucose daily and record, and daily exercise . Assess for barriers to self care  . Discuss complications of uncontrolled DM   Patient Self Care Activities:  . Self administers medications as prescribed . Attends all  scheduled provider appointments . Checks blood sugars as prescribed and utilize hyper and hypoglycemia protocol as needed . Adhere to ADA diet as discussed . Assessed for continued [facility] engagement   Initial goal documentation        The patient verbalized understanding of instructions provided today and declined a print copy of patient instruction materials.   Telephone follow up appointment with care management team member scheduled for:  1 week SYMPTOMS OF A STROKE   You have any symptoms of stroke. "BE FAST" is an easy way to remember the main warning signs: ? B - Balance. Signs are dizziness, sudden trouble walking, or loss of balance. ? E - Eyes. Signs are trouble seeing or a sudden change in how you see. ? F - Face. Signs are sudden weakness or loss of feeling of the face, or the face or eyelid drooping on one side. ? A - Arms. Signs are weakness or loss of feeling in an arm. This happens suddenly and usually on one side of the body. ? S - Speech. Signs are sudden trouble speaking, slurred speech, or trouble understanding what people say. ? T - Time. Time to call emergency services. Write down what time symptoms started.  You have other signs of stroke, such as: ? A sudden, very bad headache with no known cause. ? Feeling sick to your stomach (nausea). ? Throwing up (vomiting). ? Jerky movements you cannot control (seizure).  SYMPTOMS OF A HEART ATTACK  What are the signs or symptoms? Symptoms of this  condition include:  Chest pain. It may feel like: ? Crushing or squeezing. ? Tightness, pressure, fullness, or heaviness.  Pain in the arm, neck, jaw, back, or upper body.  Shortness of breath.  Heartburn.  Indigestion.  Nausea.  Cold sweats.  Feeling tired.  Sudden lightheadedness.

## 2019-07-04 NOTE — Chronic Care Management (AMB) (Signed)
Chronic Care Management   Follow Up Note   07/04/2019 Name: Cindy Griffin MRN: [id number] DOB: Apr 24, 1946  Referred by: Steele Sizer, MD Reason for referral : Chronic Care Management (follow up DM)   Subjective: "I have been eating what I wasn't and not taking my sugars"   Objective:  Lab Results  Component Value Date   HGBA1C 9.3 (A) 07/03/2019   HGBA1C 6.9 03/01/2019   HGBA1C 7.3 (A) 10/31/2018   Lab Results  Component Value Date   MICROALBUR 1.4 10/31/2018   LDLCALC 45 10/31/2018   CREATININE 0.76 07/03/2019   Lab Results  Component Value Date   CHOL 118 10/31/2018   CHOL 118 07/15/2017   CHOL 131 11/15/2016   Lab Results  Component Value Date   HDL 58 10/31/2018   HDL 56 07/15/2017   HDL 54 11/15/2016   Lab Results  Component Value Date   LDLCALC 45 10/31/2018   LDLCALC 37 07/15/2017   LDLCALC 57 11/15/2016   Lab Results  Component Value Date   TRIG 73 10/31/2018   TRIG 68 05/29/2018   TRIG 59 05/22/2018   Lab Results  Component Value Date   CHOLHDL 2.0 10/31/2018   CHOLHDL 2.1 07/15/2017   CHOLHDL 2.4 11/15/2016   No results found for: LDLDIRECT  Assessment: Cindy Griffin is a 73 year old female patient of Dr. Steele Sizer, who has been engaged with CCM Team since 09/2018. She has had ongoing DM education as support as well as assistance with medication management and medication assistance. Unfortunately she did not qualify for financial assistance with Ozempic and her application was not completed in a timely manner for Anoro. A new referral was recently placed by her PCP secondary to a significant increase in her A1C as indicated above. Today RN CM followed up with Cindy Griffin to discuss barriers to self care and recent labs.   Review of patient status, including review of consultants reports, relevant laboratory and other test results, and collaboration with appropriate care team members and the patient's provider was performed as part of  comprehensive patient evaluation and provision of chronic care management services.    Goals Addressed            This Visit's Progress   . My A1C has increased to 9 (pt-stated)       Cindy Griffin states she was "shocked" that her A1C has increased to 9 however she admits to not exercising as she was, eating [employer] daily which include rice or pasta daily, and not having her Ozempic secondary to cost. She also admits to not checking her blood sugars. "I just got tired of doing it" she says.   She has called [employer] and informed the concierge that she needs to have diabetic meals placed, she has cleaned out her refrigerator and given away all juices, canned fruits, and deserts provided by [employer]. She has walked today and plans to walk daily as weather permits. She states she will begin to check her fasting sugars again daily.  She is engaged with [facility] and received a home visit recently. She was told by [facility] Provider that her leg pain/restless leg could be caused by Neuropathy and to discuss with provider. Dr Ancil Boozer has prescribed Lyrica during yesterdays office visit. She was provided sample of Ozempic.   Current Barriers:  [name] Knowledge Deficits related to basic Diabetes pathophysiology and self care/management . Lack of personal glucometer to monitor blood sugar . Non-compliance with  DM self care adherence  Nurse Case Manager Clinical Goal(s):  [name] Over the next 14 days, patient will demonstrate improved adherence to prescribed treatment plan for diabetes self care/management as evidenced by checking blood glucose levels as prescribed, adhering to ADA/low carb diet, daily exercise, and medication adherence  Interventions:  . Reinforced DM education including importance of medication compliance, following ADA diet, checking blood glucose daily and record, and daily exercise . Assess for barriers to self care  . Discuss complications of uncontrolled DM  . Referral to  [facility] Pharmacist for Ozempic alternative   Patient Self Care Activities:  . Self administers medications as prescribed . Attends all scheduled provider appointments . Checks blood sugars as prescribed and utilize hyper and hypoglycemia protocol as needed . Adhere to ADA diet as discussed . Assessed for continued [facility] engagement   Initial goal documentation         Telephone follow up appointment with care management team member scheduled for: 1 weeks    Arthor Gorter E. Rollene Rotunda, RN, BSN Nurse Care Coordinator [facility] Care Management  [phone]

## 2019-07-06 ENCOUNTER — Ambulatory Visit: Payer: Self-pay | Admitting: Pharmacist

## 2019-07-06 DIAGNOSIS — IMO0002 Reserved for concepts with insufficient information to code with codable children: Secondary | ICD-10-CM

## 2019-07-06 DIAGNOSIS — E1129 Type 2 diabetes mellitus with other diabetic kidney complication: Secondary | ICD-10-CM

## 2019-07-06 DIAGNOSIS — E1342 Other specified diabetes mellitus with diabetic polyneuropathy: Secondary | ICD-10-CM

## 2019-07-06 NOTE — Chronic Care Management (AMB) (Signed)
  Chronic Care Management   Care Coodination Note  07/06/2019 Name: Cindy Griffin MRN: [id number] DOB: 03/07/1946  Care Coordination ([place])  Spoke with [employer] representative regarding Ms. Dohmen Ozempic application submitted 03/01/34. Representative states that Ms. Marrin may qualify for CMS Low Income Subsidy (LIS) based on reported income and that [employer] will need an LIS denial letter in order to enroll her in [place] assistance program.    Follow up plan: Telephone follow up appointment with care management team member scheduled for: Tuesday, August 11  [name], PharmD Clinical Pharmacist [facility] [phone]

## 2019-07-09 ENCOUNTER — Other Ambulatory Visit: Payer: Self-pay | Admitting: Family Medicine

## 2019-07-09 DIAGNOSIS — J411 Mucopurulent chronic bronchitis: Secondary | ICD-10-CM

## 2019-07-10 ENCOUNTER — Ambulatory Visit: Payer: Self-pay | Admitting: Pharmacist

## 2019-07-10 DIAGNOSIS — IMO0002 Reserved for concepts with insufficient information to code with codable children: Secondary | ICD-10-CM

## 2019-07-10 DIAGNOSIS — E1129 Type 2 diabetes mellitus with other diabetic kidney complication: Secondary | ICD-10-CM

## 2019-07-10 DIAGNOSIS — E1342 Other specified diabetes mellitus with diabetic polyneuropathy: Secondary | ICD-10-CM

## 2019-07-10 NOTE — Chronic Care Management (AMB) (Signed)
  Chronic Care Management   Note  07/10/2019 Name: Cindy Griffin MRN: [id number] DOB: Apr 03, 1946  Successful telephone outreach to Cindy Griffin regarding [employer] application. HIPAA identifiers verified.   Per [employer], application needs a denial letter from [facility] Extra Help. Cindy Griffin states she doesn't have time to complete today but schedules a time tomorrow, 8/12 to complete virtually with CCM pharmacist    Goals Addressed            This Visit's Progress   . "I need as much assistance with my medication as I can" (pt-stated)       Clinic Pharmacy Clinical Goal(s): (updated)  Over the next 30 days, Cindy Griffin will provide the necessary supplementary documents (proof of out of pocket prescription expenditure, proof of household income) needed for medication assistance applications to CCM pharmacist.   Interventions:  . CCM pharmacist will collaborate with HTA to obtain OOP expenditure  . CCM pharmacist will facilitate application submission for Anoro Ellipta, and Proventil . CCM pharmacist to collaborate with Dr. Ancil Boozer to ensure patient should no longer be taking Ozempic. If Dr. Ancil Boozer would like patient to be on Ozempic, CCM pharmaicst will proceed with Ozempic ([employer]) medication assistance application.  . Medicare Extra Help application  Updated 12/06/47:  Dr. Ancil Boozer would like to continue Ozempic.Submitted completed application to [employer], uploaded to media tab  Patient received [place] rejection letter; will submit updated TROOP for patient but it appears she has not spent enough out of pocket to qualify  Develop another COPD regimen for patient as she may not qualify for Anoro through [place] for several months   Patient Self Care Activities:  . Contact provider before running out of any medications   *reactivated goal  Current Barriers:  . Film/video editor.  . Difficulty obtaining medications  Nurse Case Manager Clinical Goal(s):  Cindy Griffin Kitchen  Over the next 14 days, patient will work with [facility] Pharmacist to address needs related to medication affordibility specifically Anora and Ozempic  Interventions:  . Advised patient to complete previously provided application for med assist and to gather previously discussed documents needed to assist pharmacist with application   . Provided emotional support and reassurance related to current pandemic of COVID-19 . Discussed current CDC recommendations for social distancing and hand hygiene.  .   Patient Self Care Activities:  Cindy Griffin Kitchen Gather needed documents . Engage with [facility] Pharmacist - completed 02/20/19 . Follow CDC COVID-19 guidelines including symptom reporting, social distancing, and hand hygiene  Please see past updates related to this goal by clicking on the "Past Updates" button in the selected goal          Follow up plan: Telephone follow up appointment with care management team member scheduled for: 8/12 with PharmD  [name], PharmD Clinical Pharmacist [facility] [phone]

## 2019-07-11 ENCOUNTER — Ambulatory Visit (INDEPENDENT_AMBULATORY_CARE_PROVIDER_SITE_OTHER): Payer: PPO | Admitting: Pharmacist

## 2019-07-11 DIAGNOSIS — E1165 Type 2 diabetes mellitus with hyperglycemia: Secondary | ICD-10-CM

## 2019-07-11 DIAGNOSIS — IMO0002 Reserved for concepts with insufficient information to code with codable children: Secondary | ICD-10-CM

## 2019-07-11 DIAGNOSIS — E1129 Type 2 diabetes mellitus with other diabetic kidney complication: Secondary | ICD-10-CM

## 2019-07-11 NOTE — Chronic Care Management (AMB) (Signed)
  Chronic Care Management   Note  07/11/2019 Name: KYNDALL AMERO MRN: [id number] DOB: 12/28/1945  Successful telephone outreach to [name] regarding [employer] application. HIPAA identifiers verified.   Completed Medicare Extra Help (LIS) application via telephone with patient through [email or web address].    Goals Addressed            This Visit's Progress   . "I need as much assistance with my medication as I can" (pt-stated)       Clinic Pharmacy Clinical Goal(s): (updated)  Over the next 30 days, [name] will provide the necessary supplementary documents (proof of out of pocket prescription expenditure, proof of household income) needed for medication assistance applications to CCM pharmacist.   Interventions:  . CCM pharmacist will collaborate with HTA to obtain OOP expenditure  . CCM pharmacist will facilitate application submission for Anoro Ellipta, and Proventil . CCM pharmacist to collaborate with Dr. Ancil Boozer to ensure patient should no longer be taking Ozempic. If Dr. Ancil Boozer would like patient to be on Ozempic, CCM pharmaicst will proceed with Ozempic ([employer]) medication assistance application.  . Medicare Extra Help application completed 5/85/27  Updated 04/02/19:  Dr. Ancil Boozer would like to continue Ozempic.Submitted completed application to [employer], uploaded to media tab  Patient received [place] rejection letter; will submit updated TROOP for patient but it appears she has not spent enough out of pocket to qualify  Develop another COPD regimen for patient as she may not qualify for Anoro through [place] for several months   Patient Self Care Activities:  . Contact provider before running out of any medications   *reactivated goal  Current Barriers:  . Film/video editor.  . Difficulty obtaining medications  Nurse Case Manager Clinical Goal(s):  [name] Over the next 14 days, patient will work with [facility] Pharmacist to address needs related to medication  affordibility specifically Anora and Ozempic  Interventions:  . Advised patient to complete previously provided application for med assist and to gather previously discussed documents needed to assist pharmacist with application   . Provided emotional support and reassurance related to current pandemic of COVID-19 . Discussed current CDC recommendations for social distancing and hand hygiene.  .   Patient Self Care Activities:  [name] Gather needed documents . Engage with [facility] Pharmacist - completed 02/20/19 . Follow CDC COVID-19 guidelines including symptom reporting, social distancing, and hand hygiene  Please see past updates related to this goal by clicking on the "Past Updates" button in the selected goal          Follow up plan: Telephone follow up appointment with care management team member scheduled for: 1 week with PharmD  [name], PharmD Clinical Pharmacist [facility] [phone]

## 2019-07-13 ENCOUNTER — Ambulatory Visit: Payer: Self-pay

## 2019-07-13 ENCOUNTER — Other Ambulatory Visit: Payer: Self-pay

## 2019-07-13 DIAGNOSIS — IMO0002 Reserved for concepts with insufficient information to code with codable children: Secondary | ICD-10-CM

## 2019-07-13 DIAGNOSIS — E1129 Type 2 diabetes mellitus with other diabetic kidney complication: Secondary | ICD-10-CM

## 2019-07-13 NOTE — Chronic Care Management (AMB) (Signed)
Chronic Care Management   Follow Up Note   07/13/2019 Name: Cindy Griffin MRN: [id number] DOB: May 28, 1946  Referred by: Steele Sizer, MD Reason for referral : Chronic Care Management (follow up DM)   Subjective: "[place] the mall to walk"   Objective:  Lab Results  Component Value Date   HGBA1C 9.3 (A) 07/03/2019   HGBA1C 6.9 03/01/2019   HGBA1C 7.3 (A) 10/31/2018   Lab Results  Component Value Date   MICROALBUR 1.4 10/31/2018   LDLCALC 45 10/31/2018   CREATININE 0.76 07/03/2019     Assessment: Cindy Griffin is a 73 year old female patient of Dr. Steele Sizer, who has been engaged with CCM Team since 09/2018. She has had ongoing DM education as support as well as assistance with medication management and medication assistance. Unfortunately she did not qualify for financial assistance with Ozempic and her application was not completed in a timely manner for Anoro. A new referral was recently placed by her PCP secondary to a significant increase in her A1C as indicated above. Today RN CM followed up with Cindy Griffin to discuss progression towards goals and barriers to self care.  Review of patient status, including review of consultants reports, relevant laboratory and other test results, and collaboration with appropriate care team members and the patient's provider was performed as part of comprehensive patient evaluation and provision of chronic care management services.    Goals Addressed            This Visit's Progress   . My A1C has increased to 9 (pt-stated)       Cindy Griffin is currently not at home. She is out with a friend and on her way to the "mall to walk". She states she has been doing much better monitoring her carbohydrate intake and her blood sugars are "normal". She is unable to provide specific numbers or range. She states she will be home later today and able to discuss at that time. She states she is committed to getting her A1C back below 7  over the next 3 months. She is taking her medications as prescribed and working with [facility] Pharmacist [name] on financial assistance for her Ozempic. Until assistance is received, patient will utilize office samples and will notify office for sample pick up prior to running out of medication.   Current Barriers:  Cindy Griffin Knowledge Deficits related to basic Diabetes pathophysiology and self care/management . Non-compliance with DM self care adherence  Nurse Case Manager Clinical Goal(s):  Cindy Griffin Over the next 14 days, patient will demonstrate improved adherence to prescribed treatment plan for diabetes self care/management as evidenced by checking blood glucose levels as prescribed, adhering to ADA/low carb diet, daily exercise, and medication adherence-goal met 07/13/2019 . Over the next 60 days, patient will demonstrate improved adherence to prescribed treatment plan for diabetes self care/management as evidenced by checking blood glucose levels as prescribed, adhering to ADA/low carb diet, daily exercise, and medication adherence  Interventions:  . Assessed for compliance with DM self care plan . Provided positive feedback for patient's compliance with DM plan of care . Assess for barriers to self care    Patient Self Care Activities:  . Self administers medications as prescribed . Attends all scheduled provider appointments . Checks blood sugars as prescribed and utilize hyper and hypoglycemia protocol as needed . Adhere to ADA diet as discussed . Assessed for continued [facility] engagement   Please see past updates in goals  section for documentation of goal progression          The care management team will reach out to the patient again over the next 30 days.   The patient has been provided with contact information for the care management team and has been advised to call with any health related questions or concerns.       E. Rollene Rotunda, RN, BSN Nurse Care  Coordinator [facility] / [facility] Care Management  [phone]

## 2019-07-14 NOTE — Patient Instructions (Addendum)
Thank you allowing the Chronic Care Management Team to be a part of your care! It was a pleasure speaking with you today!  1. Continue to check your fasting blood sugar daily and record. We will review at out next encounter. 2. You need to exercise at least 5 days a week. Walk in the early mornings when it is cooler. I am so happy to hear you have started walking at the [facility]!! Keep up the good work. 3. PLEASE reduce your intake of carbohydrates (rice, pasta, bread, juice, fruits). 4. Take all your medications as prescribed. Gather all necessary papers for med assist and give to [place]. 5. Please review the diabetic booklet I gave you when I was visiting your home. It has all the information in it to help you be successful with managing your diabetes and reducing your A1C.  CCM (Chronic Care Management) Team   Trish Fountain RN, BSN Nurse Care Coordinator  [phone]  [name] PharmD  Clinical Pharmacist  [phone]   [place], [place] Social Worker [phone]  Goals Addressed            This Visit's Progress   . My A1C has increased to 9 (pt-stated)       Current Barriers:  [name] Knowledge Deficits related to basic Diabetes pathophysiology and self care/management . Lack of personal glucometer to monitor blood sugar . Non-compliance with DM self care adherence  Nurse Case Manager Clinical Goal(s):  [name] Over the next 14 days, patient will demonstrate improved adherence to prescribed treatment plan for diabetes self care/management as evidenced by checking blood glucose levels as prescribed, adhering to ADA/low carb diet, daily exercise, and medication adherence-goal met 07/13/2019 . Over the next 60 days, patient will demonstrate improved adherence to prescribed treatment plan for diabetes self care/management as evidenced by checking blood glucose levels as prescribed, adhering to ADA/low carb diet, daily exercise, and medication adherence  Interventions:   . Assessed for compliance with DM self care plan . Provided positive feedback for patient's compliance with DM plan of care . Assess for barriers to self care    Patient Self Care Activities:  . Self administers medications as prescribed . Attends all scheduled provider appointments . Checks blood sugars as prescribed and utilize hyper and hypoglycemia protocol as needed . Adhere to ADA diet as discussed . Assessed for continued [facility] engagement   Please see past updates in goals section for documentation of goal progression         The patient verbalized understanding of instructions provided today and declined a print copy of patient instruction materials.   The care management team will reach out to the patient again over the next 60 days.   SYMPTOMS OF A STROKE   You have any symptoms of stroke. "BE FAST" is an easy way to remember the main warning signs: ? B - Balance. Signs are dizziness, sudden trouble walking, or loss of balance. ? E - Eyes. Signs are trouble seeing or a sudden change in how you see. ? F - Face. Signs are sudden weakness or loss of feeling of the face, or the face or eyelid drooping on one side. ? A - Arms. Signs are weakness or loss of feeling in an arm. This happens suddenly and usually on one side of the body. ? S - Speech. Signs are sudden trouble speaking, slurred speech, or trouble understanding what people say. ? T - Time. Time to call emergency services. Write  down what time symptoms started.  You have other signs of stroke, such as: ? A sudden, very bad headache with no known cause. ? Feeling sick to your stomach (nausea). ? Throwing up (vomiting). ? Jerky movements you cannot control (seizure).  SYMPTOMS OF A HEART ATTACK  What are the signs or symptoms? Symptoms of this condition include:  Chest pain. It may feel like: ? Crushing or squeezing. ? Tightness, pressure, fullness, or heaviness.  Pain in the arm, neck, jaw,  back, or upper body.  Shortness of breath.  Heartburn.  Indigestion.  Nausea.  Cold sweats.  Feeling tired.  Sudden lightheadedness.

## 2019-07-18 ENCOUNTER — Ambulatory Visit: Payer: Self-pay | Admitting: Pharmacist

## 2019-07-18 DIAGNOSIS — IMO0002 Reserved for concepts with insufficient information to code with codable children: Secondary | ICD-10-CM

## 2019-07-18 DIAGNOSIS — E1129 Type 2 diabetes mellitus with other diabetic kidney complication: Secondary | ICD-10-CM

## 2019-07-18 DIAGNOSIS — E1165 Type 2 diabetes mellitus with hyperglycemia: Secondary | ICD-10-CM | POA: Diagnosis not present

## 2019-07-18 NOTE — Chronic Care Management (AMB) (Signed)
  Chronic Care Management   Note  07/18/2019 Name: Cindy Griffin MRN: [id number] DOB: Dec 20, 1945   Subjective Successful telephone outreach to Cindy Griffin regarding [employer] application. HIPAA identifiers verified.   Assessment Cindy Griffin has not received notification regarding status of Extra Help application. [employer] did not have an update on Ozempic application at this time   Goals Addressed            This Visit's Progress   . "I need as much assistance with my medication as I can" (pt-stated)       Clinic Pharmacy Clinical Goal(s): (updated)  Over the next 30 days, Cindy Griffin will provide the necessary supplementary documents (proof of out of pocket prescription expenditure, proof of household income) needed for medication assistance applications to CCM pharmacist.   Interventions:  . CCM pharmacist will collaborate with HTA to obtain OOP expenditure  . CCM pharmacist will facilitate application submission for Anoro Ellipta, and Proventil . Updated 8/19: Collaboration with [employer] to obtain reasons for Ozempic denial  . Medicare Extra Help application completed 8/33/82  Updated 04/02/19:  Dr. Ancil Boozer would like to continue Ozempic.Submitted completed application to [employer], uploaded to media tab  Patient received [place] rejection letter; will submit updated TROOP for patient but it appears she has not spent enough out of pocket to qualify  Develop another COPD regimen for patient as she may not qualify for Anoro through [place] for several months   Patient Self Care Activities:  . Contact provider before running out of any medications   *reactivated goal  Current Barriers:  . Film/video editor.  . Difficulty obtaining medications  Nurse Case Manager Clinical Goal(s):  [name] Over the next 14 days, patient will work with [facility] Pharmacist to address needs related to medication affordibility specifically Anora and Ozempic  Interventions:  .  Advised patient to complete previously provided application for med assist and to gather previously discussed documents needed to assist pharmacist with application   . Provided emotional support and reassurance related to current pandemic of COVID-19 . Discussed current CDC recommendations for social distancing and hand hygiene.  .   Patient Self Care Activities:  [name] Gather needed documents . Engage with [facility] Pharmacist - completed 02/20/19 . Follow CDC COVID-19 guidelines including symptom reporting, social distancing, and hand hygiene  Please see past updates related to this goal by clicking on the "Past Updates" button in the selected goal          Follow up plan: Telephone follow up appointment with care management team member scheduled for: 1 week with PharmD  [name], PharmD Clinical Pharmacist [facility] [phone]

## 2019-07-25 ENCOUNTER — Ambulatory Visit: Payer: Self-pay | Admitting: Pharmacist

## 2019-07-25 DIAGNOSIS — E1165 Type 2 diabetes mellitus with hyperglycemia: Secondary | ICD-10-CM | POA: Diagnosis not present

## 2019-07-25 DIAGNOSIS — E1129 Type 2 diabetes mellitus with other diabetic kidney complication: Secondary | ICD-10-CM

## 2019-07-25 DIAGNOSIS — IMO0002 Reserved for concepts with insufficient information to code with codable children: Secondary | ICD-10-CM

## 2019-07-27 NOTE — Chronic Care Management (AMB) (Signed)
  Chronic Care Management   Note  07/27/2019 Name: TAVA PEERY MRN: [id number] DOB: 1946/04/04   Subjective Successful telephone outreach to [name] regarding [employer] application. HIPAA identifiers verified.   Assessment Ms. Loomis has not received notification regarding status of Extra Help application.   Follow up plan: Telephone follow up appointment with care management team member scheduled for: 1 week with PharmD  [name], PharmD Clinical Pharmacist [facility] [phone]

## 2019-08-01 ENCOUNTER — Ambulatory Visit: Payer: Self-pay | Admitting: Pharmacist

## 2019-08-01 DIAGNOSIS — IMO0002 Reserved for concepts with insufficient information to code with codable children: Secondary | ICD-10-CM

## 2019-08-01 DIAGNOSIS — E1129 Type 2 diabetes mellitus with other diabetic kidney complication: Secondary | ICD-10-CM

## 2019-08-01 NOTE — Chronic Care Management (AMB) (Signed)
  Chronic Care Management   Note  08/01/2019 Name: Cindy Griffin MRN: [id number] DOB: [birth year]   Subjective Successful telephone outreach to Cindy Griffin regarding [employer] application. HIPAA identifiers verified.   Assessment  Goals Addressed            This Visit's Progress   . "I need as much assistance with my medication as I can" (pt-stated)       Clinic Pharmacy Clinical Goal(s): (updated)  Over the next 30 days, Cindy Griffin will provide the necessary supplementary documents (proof of out of pocket prescription expenditure, proof of household income) needed for medication assistance applications to CCM pharmacist.   Interventions:  . CCM pharmacist will collaborate with HTA to obtain OOP expenditure  . CCM pharmacist will facilitate application submission for Anoro Ellipta, and Proventil . Updated 8/19: Collaboration with [employer] to obtain reasons for Ozempic denial  . Medicare Extra Help application completed 05/04/76 . Updated 9/2: Patient has not received mail from [facility]; patient did receive call from representative at [facility] questioning an account at [employer]; pharmacist verified patient's LIS status with HTA representative, patient has not been granted LIS at this time.   Updated 04/02/19:  Dr. Ancil Boozer would like to continue Ozempic.Submitted completed application to [employer], uploaded to media tab  Patient received [place] rejection letter; will submit updated TROOP for patient but it appears she has not spent enough out of pocket to qualify  Develop another COPD regimen for patient as she may not qualify for Anoro through [place] for several months   Patient Self Care Activities:  . Contact provider before running out of any medications   *reactivated goal  Current Barriers:  . Film/video editor.  . Difficulty obtaining medications  Nurse Case Manager Clinical Goal(s):  [name] Over the next 14 days, patient will work with [facility] Pharmacist to  address needs related to medication affordibility specifically Anora and Ozempic  Interventions:  . Advised patient to complete previously provided application for med assist and to gather previously discussed documents needed to assist pharmacist with application   . Provided emotional support and reassurance related to current pandemic of COVID-19 . Discussed current CDC recommendations for social distancing and hand hygiene.  .   Patient Self Care Activities:  [name] Gather needed documents . Engage with [facility] Pharmacist - completed 02/20/19 . Follow CDC COVID-19 guidelines including symptom reporting, social distancing, and hand hygiene  Please see past updates related to this goal by clicking on the "Past Updates" button in the selected goal           Follow up plan: Telephone follow up appointment with care management team member scheduled for: 1 week with PharmD  [name], PharmD Clinical Pharmacist [facility] [phone]

## 2019-08-02 ENCOUNTER — Other Ambulatory Visit: Payer: Self-pay | Admitting: Family Medicine

## 2019-08-02 DIAGNOSIS — E785 Hyperlipidemia, unspecified: Secondary | ICD-10-CM

## 2019-08-02 DIAGNOSIS — E1121 Type 2 diabetes mellitus with diabetic nephropathy: Secondary | ICD-10-CM

## 2019-08-04 ENCOUNTER — Other Ambulatory Visit: Payer: Self-pay | Admitting: Family Medicine

## 2019-08-04 DIAGNOSIS — E1121 Type 2 diabetes mellitus with diabetic nephropathy: Secondary | ICD-10-CM

## 2019-08-04 DIAGNOSIS — E785 Hyperlipidemia, unspecified: Secondary | ICD-10-CM

## 2019-08-07 MED ORDER — METFORMIN HCL ER 750 MG PO TB24: 1500.0000 mg | ORAL_TABLET | Freq: Every day | ORAL | 1 refills | Status: DC

## 2019-08-07 NOTE — Addendum Note (Signed)
Addended by: Jefferson Fuel on: 08/07/2019 10:08 AM   Modules accepted: Orders

## 2019-08-07 NOTE — Telephone Encounter (Signed)
Pt called to follow up on Rx metFORMIN (GLUCOPHAGE-XR) 750 MG 24 hr tablet  Called the pharmacy, they confirmed they never received the rx dated 03/01/2019 for 180 tabs and one refill.    Pt is completely out.  Pharmacy loaned her 4 pills to get her through.  Can you resend to  [place] (N), [place] [address] [place] [phone] (Phone) [phone] (Fax)

## 2019-08-08 ENCOUNTER — Ambulatory Visit: Payer: Self-pay | Admitting: Pharmacist

## 2019-08-08 ENCOUNTER — Telehealth: Payer: Self-pay

## 2019-08-08 NOTE — Chronic Care Management (AMB) (Signed)
  Chronic Care Management   Note  08/08/2019 Name: Cindy Griffin MRN: [id number] DOB: February 15, 1946  73 y.o. year old female referred to Chronic Care Management by Dr. Steele Sizer for medication assistance. Follow up today on medication assistance application and BG control.   Was unable to reach patient via telephone today and have left HIPAA compliant voicemail asking patient to return my call. (unsuccessful outreach #1).  Follow up plan: A HIPPA compliant phone message was left for the patient providing contact information and requesting a return call.  Telephone follow up appointment with care management team member scheduled for:5-7  [name], PharmD Clinical Pharmacist [place] [phone]

## 2019-08-14 ENCOUNTER — Telehealth: Payer: Self-pay

## 2019-08-15 ENCOUNTER — Ambulatory Visit: Payer: Self-pay | Admitting: Pharmacist

## 2019-08-15 ENCOUNTER — Ambulatory Visit (INDEPENDENT_AMBULATORY_CARE_PROVIDER_SITE_OTHER): Payer: PPO | Admitting: Pharmacist

## 2019-08-15 DIAGNOSIS — IMO0002 Reserved for concepts with insufficient information to code with codable children: Secondary | ICD-10-CM

## 2019-08-15 DIAGNOSIS — E1129 Type 2 diabetes mellitus with other diabetic kidney complication: Secondary | ICD-10-CM

## 2019-08-15 DIAGNOSIS — E1165 Type 2 diabetes mellitus with hyperglycemia: Secondary | ICD-10-CM | POA: Diagnosis not present

## 2019-08-15 NOTE — Chronic Care Management (AMB) (Signed)
  Chronic Care Management   Note  08/15/2019 Name: Cindy Griffin MRN: [id number] DOB: [birth year]  73 y.o. year old female referred to Chronic Care Management by Dr. Steele Sizer for medication assistance. Follow up today on medication assistance application and BG control.   Was unable to reach patient via telephone today and have left HIPAA compliant voicemail asking patient to return my call. (unsuccessful outreach #2).  Follow up plan: A HIPPA compliant phone message was left for the patient providing contact information and requesting a return call.  Telephone follow up appointment with care management team member scheduled for:5-7  [name], PharmD Clinical Pharmacist [place] [phone]

## 2019-08-15 NOTE — Chronic Care Management (AMB) (Signed)
  Chronic Care Management   Note  08/15/2019 Name: Cindy Griffin MRN: [id number] DOB: [birth year]   Subjective Incoming call from Mrs. Cindy Griffin. CCM pharmacist engaged with patient for medication assistance/LIS application.    Assessment  Goals Addressed            This Visit's Progress   . COMPLETED: "I need as much assistance with my medication as I can" (pt-stated)       Clinic Pharmacy Clinical Goal(s): (updated)  Over the next 30 days, Ms.Cindy Griffin will provide the necessary supplementary documents (proof of out of pocket prescription expenditure, proof of household income) needed for medication assistance applications to CCM pharmacist.   Interventions:  . CCM pharmacist will collaborate with HTA to obtain OOP expenditure  . CCM pharmacist will facilitate application submission for Anoro Ellipta, and Proventil . Updated 8/19: Collaboration with [employer] to obtain reasons for Ozempic denial  . Medicare Extra Help application completed 6/76/72 . Updated 9/2: Patient has not received mail from [facility]; patient did receive call from representative at [facility] questioning an account at [employer]; pharmacist verified patient's LIS status with HTA representative, patient has not been granted LIS at this time.  [name] Updated 9/16: patient has been approved for partial income subsidy (Medicare Extra Help) of 50%. Upload copy of approval letter into media tab. Copays should be lowered for all medications at this time.   Updated 04/02/19:  Dr. Ancil Boozer would like to continue Ozempic.Submitted completed application to [employer], uploaded to media tab  Patient received [place] rejection letter; will submit updated TROOP for patient but it appears she has not spent enough out of pocket to qualify  Develop another COPD regimen for patient as she may not qualify for Anoro through [place] for several months   Patient Self Care Activities:  . Contact provider before running out of any medications    *reactivated goal  Current Barriers:  . Film/video editor.  . Difficulty obtaining medications  Nurse Case Manager Clinical Goal(s):  [name] Over the next 14 days, patient will work with [facility] Pharmacist to address needs related to medication affordibility specifically Anora and Ozempic  Interventions:  . Advised patient to complete previously provided application for med assist and to gather previously discussed documents needed to assist pharmacist with application   . Provided emotional support and reassurance related to current pandemic of COVID-19 . Discussed current CDC recommendations for social distancing and hand hygiene.  .   Patient Self Care Activities:  [name] Gather needed documents . Engage with [facility] Pharmacist - completed 02/20/19 . Follow CDC COVID-19 guidelines including symptom reporting, social distancing, and hand hygiene  Please see past updates related to this goal by clicking on the "Past Updates" button in the selected goal           Follow up plan: Telephone follow up appointment with care management team member scheduled for: 2 weeks with PharmD to ensure patient has picked up all medications, including Ozempic and Anoro  [name], PharmD Clinical Pharmacist [place] [facility] [phone]

## 2019-08-16 ENCOUNTER — Other Ambulatory Visit: Payer: Self-pay | Admitting: Family Medicine

## 2019-08-16 DIAGNOSIS — K219 Gastro-esophageal reflux disease without esophagitis: Secondary | ICD-10-CM

## 2019-08-16 DIAGNOSIS — E1121 Type 2 diabetes mellitus with diabetic nephropathy: Secondary | ICD-10-CM

## 2019-08-29 ENCOUNTER — Ambulatory Visit: Payer: Self-pay | Admitting: Pharmacist

## 2019-08-29 DIAGNOSIS — IMO0002 Reserved for concepts with insufficient information to code with codable children: Secondary | ICD-10-CM

## 2019-08-29 DIAGNOSIS — E1129 Type 2 diabetes mellitus with other diabetic kidney complication: Secondary | ICD-10-CM

## 2019-08-29 NOTE — Chronic Care Management (AMB) (Signed)
  Chronic Care Management   Note  08/29/2019 Name: Cindy Griffin MRN: [id number] DOB: 06/05/1946   Subjective Telephone outreach follow up today to ensure patient's copays were affordable on new LIS. HIPAA identifiers verified.  Cindy Griffin confirms that copays have been lowered significantly. Will close out CCM pharmacy goals at this time.    Assessment  Goals Addressed            This Visit's Progress   . COMPLETED: "I need as much assistance with my medication as I can" (pt-stated)       Clinic Pharmacy Clinical Goal(s): (updated)  Over the next 30 days, Cindy Griffin will provide the necessary supplementary documents (proof of out of pocket prescription expenditure, proof of household income) needed for medication assistance applications to CCM pharmacist.   Interventions:  . CCM pharmacist will collaborate with HTA to obtain OOP expenditure  . CCM pharmacist will facilitate application submission for Anoro Ellipta, and Proventil . Updated 8/19: Collaboration with [employer] to obtain reasons for Ozempic denial  . Medicare Extra Help application completed 9/41/74 . Updated 9/2: Patient has not received mail from [facility]; patient did receive call from representative at [facility] questioning an account at [employer]; pharmacist verified patient's LIS status with HTA representative, patient has not been granted LIS at this time.  [name] Updated 9/16: patient has been approved for partial income subsidy (Medicare Extra Help) of 50%. Upload copy of approval letter into media tab. Copays should be lowered for all medications at this time.  [name] Updated 9/30: patient confirms copay prices have come down  Updated 04/02/19:  Dr. Ancil Boozer would like to continue Ozempic.Submitted completed application to [employer], uploaded to media tab  Patient received [place] rejection letter; will submit updated TROOP for patient but it appears she has not spent enough out of pocket to qualify  Develop another COPD  regimen for patient as she may not qualify for Anoro through [place] for several months   Patient Self Care Activities:  . Contact provider before running out of any medications   *reactivated goal  Current Barriers:  . Film/video editor.  . Difficulty obtaining medications  Nurse Case Manager Clinical Goal(s):  [name] Over the next 14 days, patient will work with [facility] Pharmacist to address needs related to medication affordibility specifically Anora and Ozempic  Interventions:  . Advised patient to complete previously provided application for med assist and to gather previously discussed documents needed to assist pharmacist with application   . Provided emotional support and reassurance related to current pandemic of COVID-19 . Discussed current CDC recommendations for social distancing and hand hygiene.  .   Patient Self Care Activities:  [name] Gather needed documents . Engage with [facility] Pharmacist - completed 02/20/19 . Follow CDC COVID-19 guidelines including symptom reporting, social distancing, and hand hygiene  Please see past updates related to this goal by clicking on the "Past Updates" button in the selected goal           Follow up plan: The patient has been provided with contact information for the care management team and has been advised to call with any health related questions or concerns.      [name], PharmD Clinical Pharmacist [facility] [phone]

## 2019-08-29 NOTE — Patient Instructions (Signed)
Congratulations! You have met all case management goals! You may call the case management team at any time should you have a question or if you have new case management needs. We are happy to help you! We will let your doctor know that you have met your goals.    Thank you allowing the Chronic Care Management Team to be a part of your care!   Please call a member of the CCM (Chronic Care Management) Team with any questions or case management needs in the future:    [name], PharmD  Clinical Pharmacist  [phone]

## 2019-09-11 ENCOUNTER — Other Ambulatory Visit: Payer: Self-pay | Admitting: Family Medicine

## 2019-09-11 ENCOUNTER — Telehealth: Payer: Self-pay | Admitting: Family Medicine

## 2019-09-11 ENCOUNTER — Other Ambulatory Visit: Payer: Self-pay

## 2019-09-11 ENCOUNTER — Ambulatory Visit
Admission: RE | Admit: 2019-09-11 | Discharge: 2019-09-11 | Disposition: A | Discharge status: Home / Self Care | Payer: PPO | Source: Ambulatory Visit | Attending: Family Medicine | Admitting: Family Medicine

## 2019-09-11 DIAGNOSIS — E2839 Other primary ovarian failure: Secondary | ICD-10-CM | POA: Diagnosis not present

## 2019-09-11 DIAGNOSIS — Z20822 Contact with and (suspected) exposure to covid-19: Secondary | ICD-10-CM

## 2019-09-11 DIAGNOSIS — Z780 Asymptomatic menopausal state: Secondary | ICD-10-CM | POA: Diagnosis not present

## 2019-09-11 DIAGNOSIS — J411 Mucopurulent chronic bronchitis: Secondary | ICD-10-CM

## 2019-09-11 DIAGNOSIS — Z1231 Encounter for screening mammogram for malignant neoplasm of breast: Secondary | ICD-10-CM | POA: Diagnosis not present

## 2019-09-11 NOTE — Telephone Encounter (Signed)
Pt stopped by and wanted to know if we had any samples of ozempic or anything similar to it. I did not see it on her med list so I was not able to give it to her.

## 2019-09-13 LAB — NOVEL CORONAVIRUS, NAA: SARS-CoV-2, NAA: NOT DETECTED

## 2019-10-03 ENCOUNTER — Other Ambulatory Visit: Payer: Self-pay | Admitting: Family Medicine

## 2019-10-03 DIAGNOSIS — E1121 Type 2 diabetes mellitus with diabetic nephropathy: Secondary | ICD-10-CM

## 2019-10-03 DIAGNOSIS — I10 Essential (primary) hypertension: Secondary | ICD-10-CM

## 2019-10-04 DIAGNOSIS — E119 Type 2 diabetes mellitus without complications: Secondary | ICD-10-CM | POA: Diagnosis not present

## 2019-10-04 LAB — HM DIABETES EYE EXAM

## 2019-11-02 ENCOUNTER — Ambulatory Visit: Payer: PPO | Admitting: Family Medicine

## 2019-11-09 DIAGNOSIS — M48062 Spinal stenosis, lumbar region with neurogenic claudication: Secondary | ICD-10-CM | POA: Diagnosis not present

## 2019-11-09 DIAGNOSIS — M4802 Spinal stenosis, cervical region: Secondary | ICD-10-CM | POA: Diagnosis not present

## 2019-11-09 DIAGNOSIS — M5412 Radiculopathy, cervical region: Secondary | ICD-10-CM | POA: Diagnosis not present

## 2019-11-09 DIAGNOSIS — M1612 Unilateral primary osteoarthritis, left hip: Secondary | ICD-10-CM | POA: Diagnosis not present

## 2019-11-09 DIAGNOSIS — M5416 Radiculopathy, lumbar region: Secondary | ICD-10-CM | POA: Diagnosis not present

## 2019-11-09 DIAGNOSIS — M62838 Other muscle spasm: Secondary | ICD-10-CM | POA: Diagnosis not present

## 2019-11-09 DIAGNOSIS — M5136 Other intervertebral disc degeneration, lumbar region: Secondary | ICD-10-CM | POA: Diagnosis not present

## 2019-11-09 DIAGNOSIS — M5030 Other cervical disc degeneration, unspecified cervical region: Secondary | ICD-10-CM | POA: Diagnosis not present

## 2019-11-12 ENCOUNTER — Ambulatory Visit (INDEPENDENT_AMBULATORY_CARE_PROVIDER_SITE_OTHER): Payer: PPO | Admitting: Family Medicine

## 2019-11-12 ENCOUNTER — Encounter: Payer: Self-pay | Admitting: Family Medicine

## 2019-11-12 ENCOUNTER — Other Ambulatory Visit: Payer: Self-pay

## 2019-11-12 VITALS — BP 144/62 | HR 91 | Temp 96.8°F | Resp 14 | Ht 64.0 in | Wt 240.0 lb

## 2019-11-12 DIAGNOSIS — E1169 Type 2 diabetes mellitus with other specified complication: Secondary | ICD-10-CM

## 2019-11-12 DIAGNOSIS — E1142 Type 2 diabetes mellitus with diabetic polyneuropathy: Secondary | ICD-10-CM | POA: Diagnosis not present

## 2019-11-12 DIAGNOSIS — J411 Mucopurulent chronic bronchitis: Secondary | ICD-10-CM

## 2019-11-12 DIAGNOSIS — E1121 Type 2 diabetes mellitus with diabetic nephropathy: Secondary | ICD-10-CM

## 2019-11-12 DIAGNOSIS — Z23 Encounter for immunization: Secondary | ICD-10-CM | POA: Diagnosis not present

## 2019-11-12 DIAGNOSIS — E538 Deficiency of other specified B group vitamins: Secondary | ICD-10-CM

## 2019-11-12 DIAGNOSIS — E1129 Type 2 diabetes mellitus with other diabetic kidney complication: Secondary | ICD-10-CM

## 2019-11-12 DIAGNOSIS — E785 Hyperlipidemia, unspecified: Secondary | ICD-10-CM | POA: Diagnosis not present

## 2019-11-12 DIAGNOSIS — Z79899 Other long term (current) drug therapy: Secondary | ICD-10-CM | POA: Diagnosis not present

## 2019-11-12 DIAGNOSIS — E559 Vitamin D deficiency, unspecified: Secondary | ICD-10-CM

## 2019-11-12 DIAGNOSIS — E1165 Type 2 diabetes mellitus with hyperglycemia: Secondary | ICD-10-CM | POA: Diagnosis not present

## 2019-11-12 DIAGNOSIS — I10 Essential (primary) hypertension: Secondary | ICD-10-CM | POA: Diagnosis not present

## 2019-11-12 DIAGNOSIS — IMO0002 Reserved for concepts with insufficient information to code with codable children: Secondary | ICD-10-CM

## 2019-11-12 LAB — POCT GLYCOSYLATED HEMOGLOBIN (HGB A1C): Hemoglobin A1C: 8.4 % — AB (ref 4.0–5.6)

## 2019-11-12 MED ORDER — ANORO ELLIPTA 62.5-25 MCG/INH IN AEPB: 1.0000 | INHALATION_SPRAY | Freq: Every day | RESPIRATORY_TRACT | 5 refills | Status: DC

## 2019-11-12 MED ORDER — LISINOPRIL 40 MG PO TABS: 40.0000 mg | ORAL_TABLET | Freq: Every day | ORAL | 1 refills | Status: DC

## 2019-11-12 MED ORDER — PREGABALIN 75 MG PO CAPS: 75.0000 mg | ORAL_CAPSULE | Freq: Two times a day (BID) | ORAL | 1 refills | Status: DC

## 2019-11-12 MED ORDER — GLIPIZIDE ER 10 MG PO TB24: 10.0000 mg | ORAL_TABLET | Freq: Every day | ORAL | 1 refills | Status: DC

## 2019-11-12 MED ORDER — ATORVASTATIN CALCIUM 80 MG PO TABS: 80.0000 mg | ORAL_TABLET | Freq: Every day | ORAL | 1 refills | Status: DC

## 2019-11-12 MED ORDER — HYDROCHLOROTHIAZIDE 25 MG PO TABS: 25.0000 mg | ORAL_TABLET | Freq: Every day | ORAL | 1 refills | Status: DC

## 2019-11-12 NOTE — Progress Notes (Signed)
Name: Cindy Griffin   MRN: [id number]    DOB: 03-29-1946   Date:11/12/2019       Progress Note  Subjective  Chief Complaint  Chief Complaint  Patient presents with  . Hypertension  . Hyperlipidemia  . Diabetes  . Depression  . Gastroesophageal Reflux    HPI    DMII with nephropathy: she is cannot afford brand medications. We tried Synjardi and Victoza but had to stop because of cost. She is currently on Glipizide XL10mg  and Metformin 1500 mg daily, Actos 15 mg, and glucose is trending up. We changed to [place] in Dec 2018 she is tolerating medicatiot.HbgA1C from 8.1% to 8.5%,9.0%8.0% 7.9%,7.3%it was down to 6.9%, up to 9.3% due to lack of following a diabetic diet but toady is 8.4% . She states she skips breakfast at times and her glucose can drop and gets shaky , explained she cannot take Glipizide if she is going to skip a meal. Trying to cut down on bread and sweets. She denies polyphagia, polydipsia or polyuria. She also has gastroparesis and is now on reglan. Now having symptoms of peripheral neuropathy and we will try lyrica .  She is on ACE for kidney protection . She also has neuropathy, symptoms started summer 2020 with shooting pain and numbness on her feet, but doing better since started on Lyrica .  HTN: taking medication at home bp is usually 120's-130's, but it was high when she come in today, but improved before she left.. No chest pain or palpitation, bp is at goal.  She has intermittent episodes of vertigo  History of colon cancer: very early recesction pT1N0( 0/21 lymphonod). Dr. Vicente Males did last colonoscopy 11/2018 that showed to small polyps , she has regular follow ups, next follow up is in January   Hyperlipidemia: taking Atorvastatin and denies side effects of medications.She is due for repeat labs   Morbid Obesity: she tried Contrave but made her vomit. We started her on Belvig in April 2017 and she lost weight, she was down 22 lbs by June when she  had shoulder surgery, but she stopped exercising and taking medication and hadgained 12lbs post-surgery, but stopped Belviq because of cost. Weightwasdown to 227 lbs Weight is up 8 lbs since last year   Chronic Bronchitis: she has a chronic productivecoughno recent episodes of flaresthat required steroid. Stable on Anoro. She states she has a productive cough every morning, has SOB and wheezing with activity and takes breaks when active to catch her breath   Major Depression: She is still has symptoms but does not want to take medications for depression, or sleep.States doing better but still worried about her daughter. Long history of depression, but refuses medication. No suicidal thoughts or ideation. Stable   Patient Active Problem List   Diagnosis Date Noted  . Colon cancer, ascending ([place]) 05/25/2019  . Mild episode of recurrent major depressive disorder ([place]) 08/01/2018  . SBO (small bowel obstruction) and ileocolonic anastomosis from recent colectomy 05/22/2018  . Cancer of ascending colon - pT1N0 (0/21) s/p lap right colectomy 05/11/2018 05/21/2018  . Hypomagnesemia 05/21/2018  . Ileus following gastrointestinal surgery ([place]) 05/20/2018  . Essential hemorrhagic thrombocythemia ([place]) 03/29/2018  . Hypercalcemia 11/23/2015  . DDD (degenerative disc disease), lumbar 07/28/2015  . Spinal stenosis at L4-L5 level 06/12/2015  . Benign essential HTN 05/25/2015  . Bunion 05/25/2015  . Cardiac enlargement 05/25/2015  . Cervical radicular pain 05/25/2015  . Back pain, chronic 05/25/2015  . Osteoarthritis 05/25/2015  . Dyslipidemia  05/25/2015  . Gastric reflux 05/25/2015  . Insomnia 05/25/2015  . Eczema intertrigo 05/25/2015  . Bronchitis, chronic, mucopurulent ([place]) 05/25/2015  . Numerous moles 05/25/2015  . Morbid obesity ([place]) 05/25/2015  . Perennial allergic rhinitis 05/25/2015  . DM (diabetes mellitus), type 2, uncontrolled, with renal complications ([place]) 95/62/1308  .  Snores 05/25/2015  . History of artificial joint 05/25/2015  . Degeneration of intervertebral disc of cervical region 02/06/2015  . Neuritis or radiculitis due to rupture of lumbar intervertebral disc 01/14/2015  . Abnormal presence of protein in urine 05/04/2010  . Vitamin D deficiency 05/04/2010    Past Surgical History:  Procedure Laterality Date  . ABDOMINAL HYSTERECTOMY     due to cancer-partial  . BREAST EXCISIONAL BIOPSY Right yrs ago   Benign  . COLONOSCOPY N/A 04/11/2015   Procedure: COLONOSCOPY;  Surgeon: Hulen Luster, MD;  Location[facility] ENDOSCOPY;  Service: Gastroenterology;  Laterality: N/A;  . COLONOSCOPY WITH PROPOFOL N/A 03/13/2018   Procedure: COLONOSCOPY WITH PROPOFOL;  Surgeon: [name], MD;  Location: [facility] ENDOSCOPY;  Service: Gastroenterology;  Laterality: N/A;  . COLONOSCOPY WITH PROPOFOL N/A 12/25/2018   Procedure: COLONOSCOPY WITH PROPOFOL;  Surgeon: [name], MD;  Location: [facility] ENDOSCOPY;  Service: Gastroenterology;  Laterality: N/A;  . JOINT REPLACEMENT     left knee x3,  right knee 2005  . LAPAROSCOPIC RIGHT COLECTOMY Right 05/11/2018   Procedure: LAPAROSCOPIC RIGHT HEMICOLECTOMY ERAS PATHWAY;  Surgeon: Ileana Roup, MD;  Location: [facility] ORS;  Service: General;  Laterality: Right;  . SHOULDER ARTHROSCOPY WITH OPEN ROTATOR CUFF REPAIR Left 05/18/2016   Procedure: SHOULDER ARTHROSCOPY WITH OPEN ROTATOR CUFF REPAIR,distal clavicle excision, decompression;  Surgeon: Corky Mull, MD;  Location: [facility] ORS;  Service: Orthopedics;  Laterality: Left;  . SPINAL FUSION  1994   C-Spine  . Transforaminal Epidural  02/20/2015   Injection into cervical spine- C5-6 Dr. Phyllis Ginger    Family History  Problem Relation Age of Onset  . Diabetes Mother   . Kidney disease Mother   . Hypertension Mother   . Healthy Father   . Asthma Daughter   . Diabetes Sister   . Kidney disease Sister   . Pancreatic cancer Sister   . Diabetes Brother     Social History    Socioeconomic History  . Marital status: Widowed    Spouse name: Jeneen Rinks  . Number of children: 4  . Years of education: some college  . Highest education level: 12th grade  Occupational History  . Occupation: Retired  Tobacco Use  . Smoking status: Never Smoker  . Smokeless tobacco: Never Used  . Tobacco comment: smoking cessation materials not required  Substance and Sexual Activity  . Alcohol use: Not Currently    Alcohol/week: 0.0 standard drinks  . Drug use: No  . Sexual activity: Not Currently    Birth control/protection: Surgical  Other Topics Concern  . Not on file  Social History Narrative   Lives in [place]; self; no smoked; rare alcohol; retd. CNA/senor labs tech      Sons x3; daughter- 28.    Social Determinants of Health   Financial Resource Strain:   . Difficulty of Paying Living Expenses: Not on file  Food Insecurity:   . Worried About Charity fundraiser in the Last Year: Not on file  . Ran Out of Food in the Last Year: Not on file  Transportation Needs:   . Lack of Transportation (Medical): Not on file  . Lack of Transportation (  Non-Medical): Not on file  Physical Activity:   . Days of Exercise per Week: Not on file  . Minutes of Exercise per Session: Not on file  Stress:   . Feeling of Stress : Not on file  Social Connections:   . Frequency of Communication with Friends and Family: Not on file  . Frequency of Social Gatherings with Friends and Family: Not on file  . Attends Religious Services: Not on file  . Active Member of Clubs or Organizations: Not on file  . Attends Archivist Meetings: Not on file  . Marital Status: Not on file  Intimate Partner Violence: Not At Risk  . Fear of Current or Ex-Partner: No  . Emotionally Abused: No  . Physically Abused: No  . Sexually Abused: No     Current Outpatient Medications:  .  acetaminophen (TYLENOL) 500 MG tablet, Take 1,000 mg by mouth every 6 (six) hours as needed for moderate pain  or headache. , Disp: , Rfl:  .  ANORO ELLIPTA 62.5-25 MCG/INH AEPB, Inhale 1 puff by mouth once daily, Disp: 60 each, Rfl: 0 .  Ascorbic Acid (VITAMIN C) 1000 MG tablet, Take 1,000 mg by mouth daily., Disp: , Rfl:  .  aspirin EC 81 MG tablet, Take 1 tablet (81 mg total) by mouth daily., Disp: 90 tablet, Rfl: 1 .  atorvastatin (LIPITOR) 80 MG tablet, Take 1 tablet by mouth once daily, Disp: 90 tablet, Rfl: 0 .  Carboxymethylcellul-Glycerin (LUBRICATING EYE DROPS OP), Place 2 drops into both eyes 2 (two) times daily as needed (for dry eyes)., Disp: , Rfl:  .  cetirizine (ZYRTEC) 10 MG tablet, Take 10 mg by mouth daily as needed for allergies. , Disp: , Rfl:  .  Cholecalciferol (VITAMIN D-3) 1000 units CAPS, Take 1,000 Units by mouth daily. , Disp: , Rfl:  .  famotidine (PEPCID) 20 MG tablet, TAKE 1 TABLET BY MOUTH TWICE DAILY IN  PLACE  OF  RANITIDINE, Disp: 180 tablet, Rfl: 0 .  fluticasone (FLONASE) 50 MCG/ACT nasal spray, USE TWO SPRAY IN EACH NOSTRIL ONCE DAILY, Disp: 16 g, Rfl: 0 .  glipiZIDE (GLUCOTROL XL) 10 MG 24 hr tablet, Take 1 tablet by mouth once daily with breakfast, Disp: 90 tablet, Rfl: 0 .  hydrochlorothiazide (HYDRODIURIL) 25 MG tablet, Take 1 tablet by mouth once daily, Disp: 90 tablet, Rfl: 0 .  Lancets (ONETOUCH DELICA PLUS ZOXWRU04V) MISC, CHECK GLUCOSE THREE TIMES DAILY, Disp: , Rfl:  .  lisinopril (PRINIVIL,ZESTRIL) 40 MG tablet, Take 1 tablet (40 mg total) by mouth at bedtime., Disp: 90 tablet, Rfl: 1 .  meclizine (ANTIVERT) 25 MG tablet, Take 1 tablet (25 mg total) by mouth every 6 (six) hours., Disp: 20 tablet, Rfl: 0 .  metFORMIN (GLUCOPHAGE-XR) 750 MG 24 hr tablet, Take 2 tablets (1,500 mg total) by mouth daily with breakfast., Disp: 180 tablet, Rfl: 1 .  Multiple Vitamins-Minerals (MULTIVITAMIN PO), Take 1 tablet by mouth daily., Disp: , Rfl:  .  Omega-3 Fatty Acids (OMEGA-3 CF PO), Take 660 mg by mouth daily. Over the counter product, Disp: , Rfl:  .  pioglitazone  (ACTOS) 15 MG tablet, Take 1 tablet by mouth once daily, Disp: 90 tablet, Rfl: 0 .  PROAIR HFA 108 (90 Base) MCG/ACT inhaler, INHALE 2 PUFFS INTO LUNGS EVERY 6 HOURS AS NEEDED FOR WHEEZING OR SHORTNESS OF BREATH, Disp: 9 g, Rfl: 2 .  traMADol (ULTRAM) 50 MG tablet, Take 1 tablet by mouth 3 (three) times daily as  needed., Disp: , Rfl: 3 .  traZODone (DESYREL) 50 MG tablet, TAKE ONE-HALF TO ONE TABLET BY MOUTH AT BEDTIME AS NEEDED FOR SLEEP, Disp: 90 tablet, Rfl: 0 .  pregabalin (LYRICA) 75 MG capsule, Take 1 capsule (75 mg total) by mouth 2 (two) times daily. (Patient not taking: Reported on 11/12/2019), Disp: 180 capsule, Rfl: 0  No Known Allergies  I personally reviewed active problem list, medication list, allergies, family history, social history, health maintenance with the patient/caregiver today.   ROS  Constitutional: Negative for fever or weight change.  Respiratory: Negative for cough and shortness of breath.   Cardiovascular: Negative for chest pain or palpitations.  Gastrointestinal: Negative for abdominal pain, no bowel changes.  Musculoskeletal: Negative for gait problem or joint swelling.  Skin: Negative for rash.  Neurological: Negative for dizziness or headache.  No other specific complaints in a complete review of systems (except as listed in HPI above).  Objective  Vitals:   11/12/19 1328 11/12/19 1347  BP: (!) 154/70 (!) 144/62  Pulse: 91   Resp: 14   Temp: (!) 96.8 F (36 C)   SpO2: 98%   Weight: 240 lb (108.9 kg)   Height: 5\' 4"  (1.626 m)     Body mass index is 41.2 kg/m.  Physical Exam  Constitutional: Patient appears well-developed and well-nourished. Obese  No distress.  HEENT: head atraumatic, normocephalic, pupils equal and reactive to light Cardiovascular: Normal rate, regular rhythm and normal heart sounds.  No murmur heard. No BLE edema. Pulmonary/Chest: Effort normal and breath sounds normal. No respiratory distress. Abdominal: Soft.  There  is no tenderness. Psychiatric: Patient has a normal mood and affect. behavior is normal. Judgment and thought content normal.  Recent Results (from the past 2160 hour(s))  Novel Coronavirus, NAA ([facility])     Status: None   Collection Time: 09/11/19 12:00 AM   Specimen: Oropharyngeal(OP) collection in vial transport medium   OROPHARYNGEA  TESTING  Result Value Ref Range   SARS-CoV-2, NAA Not Detected Not Detected    Comment: This nucleic acid amplification test was developed and its performance characteristics determined by [employer]. Nucleic acid amplification tests include PCR and TMA. This test has not been FDA cleared or approved. This test has been authorized by FDA under an Emergency Use Authorization (EUA). This test is only authorized for the duration of time the declaration that circumstances exist justifying the authorization of the emergency use of in vitro diagnostic tests for detection of SARS-CoV-2 virus and/or diagnosis of COVID-19 infection under section 564(b)(1) of the Act, 21 U.S.C. 062IRS-8(N) (1), unless the authorization is terminated or revoked sooner. When diagnostic testing is negative, the possibility of a false negative result should be considered in the context of a patient's recent exposures and the presence of clinical signs and symptoms consistent with COVID-19. An individual without symptoms of COVID-19 and who is not shedding SARS-CoV-2 virus would  expect to have a negative (not detected) result in this assay.   HM DIABETES EYE EXAM     Status: None   Collection Time: 10/04/19 12:00 AM  Result Value Ref Range   HM Diabetic Eye Exam No Retinopathy No Retinopathy    Diabetic Foot Exam: Diabetic Foot Exam - Simple   Simple Foot Form Diabetic Foot exam was performed with the following findings: Yes 11/12/2019  1:55 PM  Visual Inspection No deformities, no ulcerations, no other skin breakdown bilaterally: Yes Sensation Testing Intact to  touch and monofilament testing bilaterally: Yes Pulse  Check Posterior Tibialis and Dorsalis pulse intact bilaterally: Yes Comments      PHQ2/9: Depression screen [facility] 2/9 11/12/2019 11/12/2019 07/03/2019 03/01/2019 10/31/2018  Decreased Interest 1 0 1 0 0  Down, Depressed, Hopeless 1 0 1 2 0  PHQ - 2 Score 2 0 2 2 0  Altered sleeping 1 0 3 2 3   Tired, decreased energy 1 0 3 1 3   Change in appetite 0 0 0 1 3  Feeling bad or failure about yourself  0 0 0 0 0  Trouble concentrating 0 0 0 0 1  Moving slowly or fidgety/restless 0 0 0 0 0  Suicidal thoughts 0 0 0 0 0  PHQ-9 Score 4 0 8 6 10   Difficult doing work/chores Not difficult at all - Not difficult at all Not difficult at all Somewhat difficult  Some recent data might be hidden    phq 9 is negative   Fall Risk: Fall Risk  11/12/2019 11/12/2019 07/03/2019 10/31/2018 08/01/2018  Falls in the past year? 0 0 0 0 No  Number falls in past yr: 0 0 0 0 -  Injury with Fall? 0 0 0 0 -  Comment - - - - -  Risk for fall due to : - - - - -  Risk for fall due to: Comment - - - - -  Follow up - - Falls prevention discussed - -    Functional Status Survey: Is the patient deaf or have difficulty hearing?: No Does the patient have difficulty seeing, even when wearing glasses/contacts?: No Does the patient have difficulty concentrating, remembering, or making decisions?: No Does the patient have difficulty walking or climbing stairs?: No Does the patient have difficulty dressing or bathing?: No Does the patient have difficulty doing errands alone such as visiting a doctor's office or shopping?: No    Assessment & Plan  1. DM (diabetes mellitus), type 2, uncontrolled, with renal complications (HCC)  - POCT HgB A1C - Microalbumin / creatinine urine ratio  2. Needs flu shot  - Flu vaccine HIGH DOSE PF (Fluzone High dose)  3. Long-term use of high-risk medication  - COMPLETE METABOLIC PANEL WITH GFR  4. Morbid obesity ([place])  Discussed  with the patient the risk posed by an increased BMI. Discussed importance of portion control, calorie counting and at least 150 minutes of physical activity weekly. Avoid sweet beverages and drink more water. Eat at least 6 servings of fruit and vegetables daily   5. Benign essential HTN  - lisinopril (ZESTRIL) 40 MG tablet; Take 1 tablet (40 mg total) by mouth at bedtime.  Dispense: 90 tablet; Refill: 1 - hydrochlorothiazide (HYDRODIURIL) 25 MG tablet; Take 1 tablet (25 mg total) by mouth daily.  Dispense: 90 tablet; Refill: 1  6. Dyslipidemia associated with type 2 diabetes mellitus ([place])  - Lipid panel  7. Vitamin D deficiency  Continue supplementation   8. Mucopurulent chronic bronchitis (HCC)  Continue Anoro   9. B12 deficiency  Go down on supplements to a few times a week   10. Secondary diabetes with peripheral neuropathy (HCC)  - pregabalin (LYRICA) 75 MG capsule; Take 1 capsule (75 mg total) by mouth 2 (two) times daily.  Dispense: 180 capsule; Refill: 1   11. Dyslipidemia  - atorvastatin (LIPITOR) 80 MG tablet; Take 1 tablet (80 mg total) by mouth daily.  Dispense: 90 tablet; Refill: 1

## 2019-11-13 LAB — COMPLETE METABOLIC PANEL WITH GFR
AG Ratio: 1.5 (calc) (ref 1.0–2.5)
ALT: 13 U/L (ref 6–29)
AST: 12 U/L (ref 10–35)
Albumin: 4.1 g/dL (ref 3.6–5.1)
Alkaline phosphatase (APISO): 66 U/L (ref 37–153)
BUN: 23 mg/dL (ref 7–25)
CO2: 27 mmol/L (ref 20–32)
Calcium: 9.5 mg/dL (ref 8.6–10.4)
Chloride: 105 mmol/L (ref 98–110)
Creat: 0.76 mg/dL (ref 0.60–0.93)
GFR, Est African American: 90 mL/min/{1.73_m2} (ref >=60)
GFR, Est Non African American: 78 mL/min/{1.73_m2} (ref >=60)
Globulin: 2.8 g/dL (calc) (ref 1.9–3.7)
Glucose, Bld: 236 mg/dL — ABNORMAL HIGH (ref 65–99)
Potassium: 4 mmol/L (ref 3.5–5.3)
Sodium: 141 mmol/L (ref 135–146)
Total Bilirubin: 0.3 mg/dL (ref 0.2–1.2)
Total Protein: 6.9 g/dL (ref 6.1–8.1)

## 2019-11-13 LAB — MICROALBUMIN / CREATININE URINE RATIO
Creatinine, Urine: 77 mg/dL (ref 20–275)
Microalb Creat Ratio: 8 mcg/mg creat (ref <30)
Microalb, Ur: 0.6 mg/dL

## 2019-11-13 LAB — LIPID PANEL
Cholesterol: 125 mg/dL (ref <200)
HDL: 57 mg/dL (ref >=50)
LDL Cholesterol (Calc): 43 mg/dL (calc)
Non-HDL Cholesterol (Calc): 68 mg/dL (calc) (ref <130)
Total CHOL/HDL Ratio: 2.2 (calc) (ref <5.0)
Triglycerides: 169 mg/dL — ABNORMAL HIGH (ref <150)

## 2019-11-14 ENCOUNTER — Other Ambulatory Visit: Payer: Self-pay | Admitting: Family Medicine

## 2019-11-14 DIAGNOSIS — E1121 Type 2 diabetes mellitus with diabetic nephropathy: Secondary | ICD-10-CM

## 2019-12-05 ENCOUNTER — Telehealth: Payer: Self-pay

## 2019-12-05 NOTE — Telephone Encounter (Signed)
Copied from [place] [id number]. Topic: General - Other >> Dec 05, 2019  1:43 PM Lennox Solders wrote: Reason for CRM: Pt is calling to check with dr Ancil Boozer to see if its ok for her to get covid 19 vaccine. Pt has dm, copd

## 2019-12-07 NOTE — Telephone Encounter (Signed)
Left message stating Dr. Ancil Boozer said it was for her to get the covid vaccine with dm and copd.

## 2019-12-12 ENCOUNTER — Other Ambulatory Visit: Payer: Self-pay | Admitting: Family Medicine

## 2019-12-12 DIAGNOSIS — K219 Gastro-esophageal reflux disease without esophagitis: Secondary | ICD-10-CM

## 2019-12-18 ENCOUNTER — Other Ambulatory Visit: Payer: Self-pay

## 2019-12-18 ENCOUNTER — Ambulatory Visit
Admission: RE | Admit: 2019-12-18 | Discharge: 2019-12-18 | Disposition: A | Discharge status: Home / Self Care | Payer: Medicare HMO | Source: Ambulatory Visit | Attending: Internal Medicine | Admitting: Internal Medicine

## 2019-12-18 DIAGNOSIS — C189 Malignant neoplasm of colon, unspecified: Secondary | ICD-10-CM | POA: Diagnosis not present

## 2019-12-18 DIAGNOSIS — R590 Localized enlarged lymph nodes: Secondary | ICD-10-CM | POA: Diagnosis not present

## 2019-12-18 DIAGNOSIS — C182 Malignant neoplasm of ascending colon: Secondary | ICD-10-CM | POA: Diagnosis not present

## 2019-12-18 MED ORDER — IOHEXOL 300 MG/ML  SOLN
100.0000 mL | Freq: Once | INTRAMUSCULAR | Status: AC | PRN
  Administered 2019-12-18: 100 mL via INTRAVENOUS

## 2019-12-19 ENCOUNTER — Telehealth: Payer: Self-pay

## 2019-12-19 NOTE — Telephone Encounter (Signed)
Patient notified that [facility] no longer has Proair HFA 90 mcg under formulary and for patient to reach out to them to see what is formulary. [employer] phone number is [phone]. Patient will call us back with information.

## 2019-12-20 ENCOUNTER — Other Ambulatory Visit: Payer: Self-pay

## 2019-12-20 NOTE — Progress Notes (Signed)
Patient pre screened for office appointment, no questions or concerns today. Patient reminded of upcoming appointment time and date. 

## 2019-12-21 ENCOUNTER — Other Ambulatory Visit: Payer: Self-pay

## 2019-12-21 ENCOUNTER — Inpatient Hospital Stay (HOSPITAL_BASED_OUTPATIENT_CLINIC_OR_DEPARTMENT_OTHER): Payer: Medicare HMO | Admitting: Internal Medicine

## 2019-12-21 ENCOUNTER — Inpatient Hospital Stay: Payer: Medicare HMO | Attending: Internal Medicine

## 2019-12-21 DIAGNOSIS — Z7982 Long term (current) use of aspirin: Secondary | ICD-10-CM | POA: Insufficient documentation

## 2019-12-21 DIAGNOSIS — Z800 Family history of malignant neoplasm of digestive organs: Secondary | ICD-10-CM | POA: Insufficient documentation

## 2019-12-21 DIAGNOSIS — C182 Malignant neoplasm of ascending colon: Secondary | ICD-10-CM | POA: Diagnosis not present

## 2019-12-21 DIAGNOSIS — K219 Gastro-esophageal reflux disease without esophagitis: Secondary | ICD-10-CM | POA: Diagnosis not present

## 2019-12-21 DIAGNOSIS — I10 Essential (primary) hypertension: Secondary | ICD-10-CM | POA: Insufficient documentation

## 2019-12-21 DIAGNOSIS — M129 Arthropathy, unspecified: Secondary | ICD-10-CM | POA: Diagnosis not present

## 2019-12-21 DIAGNOSIS — D649 Anemia, unspecified: Secondary | ICD-10-CM | POA: Insufficient documentation

## 2019-12-21 DIAGNOSIS — R970 Elevated carcinoembryonic antigen [CEA]: Secondary | ICD-10-CM | POA: Insufficient documentation

## 2019-12-21 DIAGNOSIS — Z7984 Long term (current) use of oral hypoglycemic drugs: Secondary | ICD-10-CM | POA: Diagnosis not present

## 2019-12-21 DIAGNOSIS — E119 Type 2 diabetes mellitus without complications: Secondary | ICD-10-CM | POA: Diagnosis not present

## 2019-12-21 DIAGNOSIS — E559 Vitamin D deficiency, unspecified: Secondary | ICD-10-CM | POA: Insufficient documentation

## 2019-12-21 DIAGNOSIS — E785 Hyperlipidemia, unspecified: Secondary | ICD-10-CM | POA: Diagnosis not present

## 2019-12-21 DIAGNOSIS — G4700 Insomnia, unspecified: Secondary | ICD-10-CM | POA: Diagnosis not present

## 2019-12-21 DIAGNOSIS — J449 Chronic obstructive pulmonary disease, unspecified: Secondary | ICD-10-CM | POA: Diagnosis not present

## 2019-12-21 DIAGNOSIS — Z79899 Other long term (current) drug therapy: Secondary | ICD-10-CM | POA: Insufficient documentation

## 2019-12-21 DIAGNOSIS — R590 Localized enlarged lymph nodes: Secondary | ICD-10-CM

## 2019-12-21 LAB — CBC WITH DIFFERENTIAL/PLATELET
Abs Immature Granulocytes: 0.02 10*3/uL (ref 0.00–0.07)
Basophils Absolute: 0.1 10*3/uL (ref 0.0–0.1)
Basophils Relative: 1 %
Eosinophils Absolute: 0.1 10*3/uL (ref 0.0–0.5)
Eosinophils Relative: 2 %
HCT: 37.3 % (ref 36.0–46.0)
Hemoglobin: 11.9 g/dL — ABNORMAL LOW (ref 12.0–15.0)
Immature Granulocytes: 0 %
Lymphocytes Relative: 27 %
Lymphs Abs: 1.9 10*3/uL (ref 0.7–4.0)
MCH: 28.2 pg (ref 26.0–34.0)
MCHC: 31.9 g/dL (ref 30.0–36.0)
MCV: 88.4 fL (ref 80.0–100.0)
Monocytes Absolute: 0.5 10*3/uL (ref 0.1–1.0)
Monocytes Relative: 8 %
Neutro Abs: 4.4 10*3/uL (ref 1.7–7.7)
Neutrophils Relative %: 62 %
Platelets: 271 10*3/uL (ref 150–400)
RBC: 4.22 MIL/uL (ref 3.87–5.11)
RDW: 14.6 % (ref 11.5–15.5)
WBC: 7 10*3/uL (ref 4.0–10.5)
nRBC: 0 % (ref 0.0–0.2)

## 2019-12-21 LAB — COMPREHENSIVE METABOLIC PANEL
ALT: 20 U/L (ref 0–44)
AST: 22 U/L (ref 15–41)
Albumin: 4.2 g/dL (ref 3.5–5.0)
Alkaline Phosphatase: 77 U/L (ref 38–126)
Anion gap: 11 (ref 5–15)
BUN: 20 mg/dL (ref 8–23)
CO2: 23 mmol/L (ref 22–32)
Calcium: 9.5 mg/dL (ref 8.9–10.3)
Chloride: 103 mmol/L (ref 98–111)
Creatinine, Ser: 1.01 mg/dL — ABNORMAL HIGH (ref 0.44–1.00)
GFR calc Af Amer: >60 mL/min (ref >60)
GFR calc non Af Amer: 55 mL/min — ABNORMAL LOW (ref >60)
Glucose, Bld: 240 mg/dL — ABNORMAL HIGH (ref 70–99)
Potassium: 4.4 mmol/L (ref 3.5–5.1)
Sodium: 137 mmol/L (ref 135–145)
Total Bilirubin: 0.6 mg/dL (ref 0.3–1.2)
Total Protein: 7.3 g/dL (ref 6.5–8.1)

## 2019-12-21 NOTE — Patient Instructions (Signed)
Re: COVID-19 vaccination.  For more information/scheduling recommend call [phone], 8:30am-4:30pm.

## 2019-12-21 NOTE — Assessment & Plan Note (Addendum)
#  Colon cancer-of ascending colon- stage I; MSI mismatch/sporadic- Excellent prognosis more than 90% chance of cure. January 2021 pelvis CT scan -no evidence of recurrence.  Patient reminded to continue surveillance colonoscopies as recommended with Dr.Anna.   #Pelvic lymphadenopathy-external iliac lymph node stable borderline enlarged.  As this has been stable on subsequent imaging-would not recommend any further work-up at this time.  Imaging only clinical indicated.  #Elevated blood glucose-recent A1c 8.4.  Recommend follow-up with PCP-discussed dietary/compliance with medication.  #Abnormal tumor marker CEA- /CEA-dec 2019- 7; April 2020- 5.4; June "abnormal"/labs [unavailable].  CEA today pending..  Will inform patient.  #Incidental findings noted on the imaging-CP lymph node; renal cyst also discussed with the patient.  # # SURVIVORSHIP- I introduced to the patient the philosophy & goals of cancer survivorship program.  I also discussed the available resources and support through the cancer survivorship program our center. Patient wants to think/ costs. Will check with Rosa.   # # I discussed regarding Covid-19 precautions.  I reviewed the vaccine effectiveness and potential side effects in detail.  Also discussed long-term effectiveness and safety profile are unclear at this time.  I discussed December, 2020 [employer]O position statement-that all patients are recommended COVID-19 vaccinations [when available]-as long as they do not have allergy to components of the vaccine.  However, I think the benefits of the vaccination outweigh the potential risks. Re: [id number]-94 vaccination.  For more information/scheduling recommend call [place] [phone], 8:30am-4:30pm.   # DISPOSITION: will call with CEA.  # follow up as needed-Dr.B  # I reviewed the blood work- with the patient in detail; also reviewed the imaging independently [as summarized above]; and with the patient in  detail.    Cc; Dr.Anna/Dr.White/[place]-

## 2019-12-21 NOTE — Telephone Encounter (Signed)
Patient called insurance and states they don't cover Anoro but preferred Bevespi. Also Prohair is not preferred but Albuterol is preferred.
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
## 2019-12-22 LAB — CEA: CEA: 7.4 ng/mL — ABNORMAL HIGH (ref 0.0–4.7)

## 2019-12-23 MED ORDER — BEVESPI AEROSPHERE 9-4.8 MCG/ACT IN AERO: 2.0000 | INHALATION_SPRAY | Freq: Every day | RESPIRATORY_TRACT | 2 refills | Status: DC

## 2019-12-25 ENCOUNTER — Telehealth: Payer: Self-pay | Admitting: Internal Medicine

## 2019-12-25 DIAGNOSIS — C182 Malignant neoplasm of ascending colon: Secondary | ICD-10-CM

## 2019-12-25 DIAGNOSIS — R970 Elevated carcinoembryonic antigen [CEA]: Secondary | ICD-10-CM

## 2019-12-25 NOTE — Addendum Note (Signed)
Addended by: Sabino Gasser on: 12/25/2019 08:46 AM   Modules accepted: Orders

## 2019-12-25 NOTE — Telephone Encounter (Signed)
Spoke to patient regarding the results of the CEA elevated at 7.4-unlikely to malignancy; given recent imaging negative for malignancy. Discussed re: follow up.   T/C/H- Recommend follow-up in 3 months-MD virtual visit; CEA 1 to 2 days prior.  Please schedule/order.   Thanks

## 2020-01-09 ENCOUNTER — Other Ambulatory Visit: Payer: Self-pay | Admitting: Family Medicine

## 2020-01-09 DIAGNOSIS — E1121 Type 2 diabetes mellitus with diabetic nephropathy: Secondary | ICD-10-CM

## 2020-01-09 NOTE — Telephone Encounter (Signed)
Approved per protocol.  

## 2020-01-11 DIAGNOSIS — M1612 Unilateral primary osteoarthritis, left hip: Secondary | ICD-10-CM | POA: Diagnosis not present

## 2020-01-11 DIAGNOSIS — M5412 Radiculopathy, cervical region: Secondary | ICD-10-CM | POA: Diagnosis not present

## 2020-01-11 DIAGNOSIS — M4802 Spinal stenosis, cervical region: Secondary | ICD-10-CM | POA: Diagnosis not present

## 2020-01-11 DIAGNOSIS — M5416 Radiculopathy, lumbar region: Secondary | ICD-10-CM | POA: Diagnosis not present

## 2020-01-11 DIAGNOSIS — M5136 Other intervertebral disc degeneration, lumbar region: Secondary | ICD-10-CM | POA: Diagnosis not present

## 2020-01-11 DIAGNOSIS — M62838 Other muscle spasm: Secondary | ICD-10-CM | POA: Diagnosis not present

## 2020-01-11 DIAGNOSIS — M48062 Spinal stenosis, lumbar region with neurogenic claudication: Secondary | ICD-10-CM | POA: Diagnosis not present

## 2020-01-11 DIAGNOSIS — M5030 Other cervical disc degeneration, unspecified cervical region: Secondary | ICD-10-CM | POA: Diagnosis not present

## 2020-02-20 DIAGNOSIS — M5416 Radiculopathy, lumbar region: Secondary | ICD-10-CM | POA: Diagnosis not present

## 2020-02-20 DIAGNOSIS — M48062 Spinal stenosis, lumbar region with neurogenic claudication: Secondary | ICD-10-CM | POA: Diagnosis not present

## 2020-02-20 DIAGNOSIS — M1612 Unilateral primary osteoarthritis, left hip: Secondary | ICD-10-CM | POA: Diagnosis not present

## 2020-02-20 DIAGNOSIS — M62838 Other muscle spasm: Secondary | ICD-10-CM | POA: Diagnosis not present

## 2020-02-20 DIAGNOSIS — M5136 Other intervertebral disc degeneration, lumbar region: Secondary | ICD-10-CM | POA: Diagnosis not present

## 2020-02-20 DIAGNOSIS — M5412 Radiculopathy, cervical region: Secondary | ICD-10-CM | POA: Diagnosis not present

## 2020-02-20 DIAGNOSIS — M4802 Spinal stenosis, cervical region: Secondary | ICD-10-CM | POA: Diagnosis not present

## 2020-02-20 DIAGNOSIS — M5030 Other cervical disc degeneration, unspecified cervical region: Secondary | ICD-10-CM | POA: Diagnosis not present

## 2020-03-12 ENCOUNTER — Encounter: Payer: Self-pay | Admitting: Family Medicine

## 2020-03-12 ENCOUNTER — Other Ambulatory Visit: Payer: Self-pay

## 2020-03-12 ENCOUNTER — Ambulatory Visit (INDEPENDENT_AMBULATORY_CARE_PROVIDER_SITE_OTHER): Payer: Medicare HMO | Admitting: Family Medicine

## 2020-03-12 VITALS — BP 116/60 | HR 89 | Temp 97.3°F | Resp 16 | Ht 64.0 in | Wt 239.5 lb

## 2020-03-12 DIAGNOSIS — J411 Mucopurulent chronic bronchitis: Secondary | ICD-10-CM

## 2020-03-12 DIAGNOSIS — Z794 Long term (current) use of insulin: Secondary | ICD-10-CM | POA: Diagnosis not present

## 2020-03-12 DIAGNOSIS — R0600 Dyspnea, unspecified: Secondary | ICD-10-CM | POA: Diagnosis not present

## 2020-03-12 DIAGNOSIS — E538 Deficiency of other specified B group vitamins: Secondary | ICD-10-CM | POA: Diagnosis not present

## 2020-03-12 DIAGNOSIS — E1129 Type 2 diabetes mellitus with other diabetic kidney complication: Secondary | ICD-10-CM

## 2020-03-12 DIAGNOSIS — F3341 Major depressive disorder, recurrent, in partial remission: Secondary | ICD-10-CM

## 2020-03-12 DIAGNOSIS — R0609 Other forms of dyspnea: Secondary | ICD-10-CM

## 2020-03-12 DIAGNOSIS — E1165 Type 2 diabetes mellitus with hyperglycemia: Secondary | ICD-10-CM

## 2020-03-12 DIAGNOSIS — I10 Essential (primary) hypertension: Secondary | ICD-10-CM | POA: Diagnosis not present

## 2020-03-12 DIAGNOSIS — IMO0002 Reserved for concepts with insufficient information to code with codable children: Secondary | ICD-10-CM

## 2020-03-12 DIAGNOSIS — R600 Localized edema: Secondary | ICD-10-CM | POA: Diagnosis not present

## 2020-03-12 LAB — POCT GLYCOSYLATED HEMOGLOBIN (HGB A1C): Hemoglobin A1C: 9.8 % — AB (ref 4.0–5.6)

## 2020-03-12 MED ORDER — INSULIN PEN NEEDLE 31G X 6 MM MISC: 1.0000 | Freq: Every day | 1 refills | Status: DC

## 2020-03-12 MED ORDER — SOLIQUA 100-33 UNT-MCG/ML ~~LOC~~ SOPN: 10.0000 [IU] | PEN_INJECTOR | Freq: Every day | SUBCUTANEOUS | 2 refills | Status: DC

## 2020-03-12 NOTE — Progress Notes (Signed)
Name: Cindy Griffin   MRN: [id number]    DOB: 1946/09/12   Date:03/12/2020       Progress Note  Subjective  Chief Complaint  Chief Complaint  Patient presents with  . Diabetes    Wants to go back on Ozempic through [place]  . Hypertension  . Dyslipidemia  . Edema    Has concerns about swelling in her knees and ankles. Unable to walk for exercise and unable to climb stairs.    HPI  Low extremity Edema: she states that over the past 6 months she noticed decrease in exercise tolerance. She used to be able to walk 4 laps at the mall in about 30 minutes and now she can only walk one lap (half a mile) in about 20 minutes - last week was the first she was able to walk one mile in about 20 minutes. She has also had intermittent cough and about 4 months ago she had to start sleeping in her recliner but she states it is because of vertigo and shoulder problems not because of orthopnea. Over the past two months she has noticed swelling on both lower legs and also weight gain. She has noticed increase in wheezing. Discussed importance of going back to cardiologist and get echo, stop actos because of possible CHF . She refuses to go at this time, she wants to wait until medication adjustments , discussed need to call 911 if she gets worse  DMII with nephropathy:  We tried Synjardi and Victoza but had to stop because of cost. She is currently on Glipizide XL10mg  and Metformin 1500 mg daily, Actos 15 mg, and glucose is trending up. We changed to [place] in Dec 2018 she is tolerating medicatiot. A1C is above 9 again, she is gaining weight, swelling, we will stop Actos and Glipizide and start [place] .  She denies polyphagia, polydipsia or polyuria. She also has gastroparesis but off metoclopramide Now having symptoms of peripheral neuropathy and we will try lyrica .She is on ACE for kidney protection . She also has neuropathy, symptoms started summer 2020 with shooting pain and numbness on her feet, but  doing better since started on Lyrica .  HTN: taking medication at home bp is usually 120's-130's, but it was high when she come in today, but improved before she left.. No chest pain or palpitation, bp is at goal.She has orthopnea.   History of colon cancer: very early recesction pT1N0( 0/21 lymphonod). Dr. Vicente Males did last colonoscopy 11/2018 that showed to small polyps, she has regular follow ups  Hyperlipidemia: taking Atorvastatin and denies side effects of medications.Last LDL was at goal at 43   Morbid Obesity: she tried Contrave but made her vomit. We started her on Belvig in April 2017 and she lost weight, she was down 22 lbs by June when she had shoulder surgery, but she stopped exercising and taking medication and hadgained 12lbs post-surgery. She stopped Belviq because of cost and now off the marked . Weightwasdown to 227 lbs, weight is now 239.5 lbs. She is swelling also    Chronic Bronchitis: she has a chronic productivecoughno recent episodes of flaresthat required steroid. She is on Bevespi now because preferred by pharmacy .  She states she has a productive cough every morning, has SOB and wheezing with activity and takes breaks when active to catch her breath, symptoms worse lately but may be from cardiac origin     Major Depression: She is still has symptoms but does not want to  take medications for depression, or sleep.States doing better but still worried about her daughter. Long history of depression, but refuses medication. No suicidal thoughts or ideation. She is currently worried about her son that is dealing with some medical problems   Patient Active Problem List   Diagnosis Date Noted  . Colon cancer, ascending ([place]) 05/25/2019  . Mild episode of recurrent major depressive disorder ([place]) 08/01/2018  . SBO (small bowel obstruction) and ileocolonic anastomosis from recent colectomy 05/22/2018  . Cancer of ascending colon - pT1N0 (0/21) s/p lap right  colectomy 05/11/2018 05/21/2018  . Hypomagnesemia 05/21/2018  . Ileus following gastrointestinal surgery ([place]) 05/20/2018  . Essential hemorrhagic thrombocythemia ([place]) 03/29/2018  . Hypercalcemia 11/23/2015  . DDD (degenerative disc disease), lumbar 07/28/2015  . Spinal stenosis at L4-L5 level 06/12/2015  . Benign essential HTN 05/25/2015  . Bunion 05/25/2015  . Cardiac enlargement 05/25/2015  . Cervical radicular pain 05/25/2015  . Back pain, chronic 05/25/2015  . Osteoarthritis 05/25/2015  . Dyslipidemia 05/25/2015  . Gastric reflux 05/25/2015  . Insomnia 05/25/2015  . Eczema intertrigo 05/25/2015  . Bronchitis, chronic, mucopurulent ([place]) 05/25/2015  . Numerous moles 05/25/2015  . Morbid obesity ([place]) 05/25/2015  . Perennial allergic rhinitis 05/25/2015  . DM (diabetes mellitus), type 2, uncontrolled, with renal complications ([place]) 27/25/3664  . Snores 05/25/2015  . History of artificial joint 05/25/2015  . Degeneration of intervertebral disc of cervical region 02/06/2015  . Neuritis or radiculitis due to rupture of lumbar intervertebral disc 01/14/2015  . Abnormal presence of protein in urine 05/04/2010  . Vitamin D deficiency 05/04/2010    Past Surgical History:  Procedure Laterality Date  . ABDOMINAL HYSTERECTOMY     due to cancer-partial  . BREAST EXCISIONAL BIOPSY Right yrs ago   Benign  . COLONOSCOPY N/A 04/11/2015   Procedure: COLONOSCOPY;  Surgeon: Hulen Luster, MD;  Location: [facility] ENDOSCOPY;  Service: Gastroenterology;  Laterality: N/A;  . COLONOSCOPY WITH PROPOFOL N/A 03/13/2018   Procedure: COLONOSCOPY WITH PROPOFOL;  Surgeon: [name], MD;  Location: [facility] ENDOSCOPY;  Service: Gastroenterology;  Laterality: N/A;  . COLONOSCOPY WITH PROPOFOL N/A 12/25/2018   Procedure: COLONOSCOPY WITH PROPOFOL;  Surgeon: [name], MD;  Location: [facility] ENDOSCOPY;  Service: Gastroenterology;  Laterality: N/A;  . JOINT REPLACEMENT     left knee x3,  right knee 2005  .  LAPAROSCOPIC RIGHT COLECTOMY Right 05/11/2018   Procedure: LAPAROSCOPIC RIGHT HEMICOLECTOMY ERAS PATHWAY;  Surgeon: Ileana Roup, MD;  Location: [facility] ORS;  Service: General;  Laterality: Right;  . SHOULDER ARTHROSCOPY WITH OPEN ROTATOR CUFF REPAIR Left 05/18/2016   Procedure: SHOULDER ARTHROSCOPY WITH OPEN ROTATOR CUFF REPAIR,distal clavicle excision, decompression;  Surgeon: Corky Mull, MD;  Location: [facility] ORS;  Service: Orthopedics;  Laterality: Left;  . SPINAL FUSION  1994   C-Spine  . Transforaminal Epidural  02/20/2015   Injection into cervical spine- C5-6 Dr. Phyllis Ginger    Family History  Problem Relation Age of Onset  . Diabetes Mother   . Kidney disease Mother   . Hypertension Mother   . Healthy Father   . Asthma Daughter   . Diabetes Sister   . Kidney disease Sister   . Pancreatic cancer Sister   . Diabetes Brother     Social History   Tobacco Use  . Smoking status: Never Smoker  . Smokeless tobacco: Never Used  . Tobacco comment: smoking cessation materials not required  Substance Use Topics  . Alcohol use: Not Currently    Alcohol/week: 0.0  standard drinks     Current Outpatient Medications:  .  acetaminophen (TYLENOL) 500 MG tablet, Take 1,000 mg by mouth every 6 (six) hours as needed for moderate pain or headache. , Disp: , Rfl:  .  Ascorbic Acid (VITAMIN C) 1000 MG tablet, Take 1,000 mg by mouth daily., Disp: , Rfl:  .  aspirin EC 81 MG tablet, Take 1 tablet (81 mg total) by mouth daily., Disp: 90 tablet, Rfl: 1 .  atorvastatin (LIPITOR) 80 MG tablet, Take 1 tablet (80 mg total) by mouth daily., Disp: 90 tablet, Rfl: 1 .  Carboxymethylcellul-Glycerin (LUBRICATING EYE DROPS OP), Place 2 drops into both eyes 2 (two) times daily as needed (for dry eyes)., Disp: , Rfl:  .  cetirizine (ZYRTEC) 10 MG tablet, Take 10 mg by mouth daily as needed for allergies. , Disp: , Rfl:  .  Cholecalciferol (VITAMIN D-3) 1000 units CAPS, Take 1,000 Units by mouth daily. , Disp:  , Rfl:  .  famotidine (PEPCID) 20 MG tablet, TAKE 1 TABLET BY MOUTH TWICE DAILY IN  PLACE  OF  RANITIDINE, Disp: 180 tablet, Rfl: 1 .  fluticasone (FLONASE) 50 MCG/ACT nasal spray, USE TWO SPRAY IN EACH NOSTRIL ONCE DAILY, Disp: 16 g, Rfl: 0 .  Glycopyrrolate-Formoterol (BEVESPI AEROSPHERE) 9-4.8 MCG/ACT AERO, Inhale 2 puffs into the lungs at bedtime., Disp: 10.7 g, Rfl: 2 .  hydrochlorothiazide (HYDRODIURIL) 25 MG tablet, Take 1 tablet (25 mg total) by mouth daily., Disp: 90 tablet, Rfl: 1 .  Lancets (ONETOUCH DELICA PLUS FEOFHQ19X) MISC, CHECK GLUCOSE THREE TIMES DAILY, Disp: , Rfl:  .  lisinopril (ZESTRIL) 40 MG tablet, Take 1 tablet (40 mg total) by mouth at bedtime., Disp: 90 tablet, Rfl: 1 .  meclizine (ANTIVERT) 25 MG tablet, Take 1 tablet (25 mg total) by mouth every 6 (six) hours., Disp: 20 tablet, Rfl: 0 .  metFORMIN (GLUCOPHAGE-XR) 750 MG 24 hr tablet, Take 2 tablets (1,500 mg total) by mouth daily with breakfast., Disp: 180 tablet, Rfl: 1 .  methocarbamol (ROBAXIN) 750 MG tablet, Take by mouth., Disp: , Rfl:  .  Multiple Vitamins-Minerals (MULTIVITAMIN PO), Take 1 tablet by mouth daily., Disp: , Rfl:  .  Omega-3 Fatty Acids (OMEGA-3 CF PO), Take 660 mg by mouth daily. Over the counter product, Disp: , Rfl:  .  pregabalin (LYRICA) 75 MG capsule, Take 1 capsule (75 mg total) by mouth 2 (two) times daily., Disp: 180 capsule, Rfl: 1 .  PROAIR HFA 108 (90 Base) MCG/ACT inhaler, INHALE 2 PUFFS INTO LUNGS EVERY 6 HOURS AS NEEDED FOR WHEEZING OR SHORTNESS OF BREATH, Disp: 9 g, Rfl: 2 .  traMADol (ULTRAM) 50 MG tablet, Take 1 tablet by mouth 3 (three) times daily as needed., Disp: , Rfl: 3 .  traZODone (DESYREL) 50 MG tablet, TAKE ONE-HALF TO ONE TABLET BY MOUTH AT BEDTIME AS NEEDED FOR SLEEP, Disp: 90 tablet, Rfl: 0 .  Insulin Glargine-Lixisenatide (SOLIQUA) 100-33 UNT-MCG/ML SOPN, Inject 10-50 units of lipase into the skin daily., Disp: 15 mL, Rfl: 2  No Known Allergies  I personally  reviewed active problem list, medication list, allergies, family history, social history, health maintenance with the patient/caregiver today.   ROS  Constitutional: Negative for fever , positive for weight change.  Respiratory: Positive  for cough and shortness of breath.   Cardiovascular: Negative for chest pain or palpitations.  Gastrointestinal: Negative for abdominal pain, no bowel changes.  Musculoskeletal: Negative for gait problem or joint swelling.  Skin: Negative for rash.  Neurological: Negative for dizziness or headache.  No other specific complaints in a complete review of systems (except as listed in HPI above).  Objective  Vitals:   03/12/20 1314  BP: 116/60  Pulse: 89  Resp: 16  Temp: (!) 97.3 F (36.3 C)  TempSrc: Temporal  SpO2: 97%  Weight: 239 lb 8 oz (108.6 kg)  Height: 5\' 4"  (1.626 m)    Body mass index is 41.11 kg/m.  Physical Exam  Constitutional: Patient appears well-developed and well-nourished. Obese  No distress.  HEENT: head atraumatic, normocephalic, pupils equal and reactive to light Cardiovascular: Normal rate, regular rhythm and normal heart sounds.  No murmur heard. 1 plus  BLE edema. Pulmonary/Chest: Effort normal and breath sounds normal. No respiratory distress. Abdominal: Soft.  There is no tenderness. Psychiatric: Patient has a normal mood and affect. behavior is normal. Judgment and thought content normal.  Recent Results (from the past 2160 hour(s))  Comprehensive metabolic panel     Status: Abnormal   Collection Time: 12/21/19 10:28 AM  Result Value Ref Range   Sodium 137 135 - 145 mmol/L   Potassium 4.4 3.5 - 5.1 mmol/L   Chloride 103 98 - 111 mmol/L   CO2 23 22 - 32 mmol/L   Glucose, Bld 240 (H) 70 - 99 mg/dL   BUN 20 8 - 23 mg/dL   Creatinine, Ser 1.01 (H) 0.44 - 1.00 mg/dL   Calcium 9.5 8.9 - 10.3 mg/dL   Total Protein 7.3 6.5 - 8.1 g/dL   Albumin 4.2 3.5 - 5.0 g/dL   AST 22 15 - 41 U/L   ALT 20 0 - 44 U/L    Alkaline Phosphatase 77 38 - 126 U/L   Total Bilirubin 0.6 0.3 - 1.2 mg/dL   GFR calc non Af Amer 55 (L) >60 mL/min   GFR calc Af Amer >60 >60 mL/min   Anion gap 11 5 - 15    Comment: Performed at [facility], [address] [place], [place] [address]  CBC with Differential     Status: Abnormal   Collection Time: 12/21/19 10:28 AM  Result Value Ref Range   WBC 7.0 4.0 - 10.5 K/uL   RBC 4.22 3.87 - 5.11 MIL/uL   Hemoglobin 11.9 (L) 12.0 - 15.0 g/dL   HCT 37.3 36.0 - 46.0 %   MCV 88.4 80.0 - 100.0 fL   MCH 28.2 26.0 - 34.0 pg   MCHC 31.9 30.0 - 36.0 g/dL   RDW 14.6 11.5 - 15.5 %   Platelets 271 150 - 400 K/uL   nRBC 0.0 0.0 - 0.2 %   Neutrophils Relative % 62 %   Neutro Abs 4.4 1.7 - 7.7 K/uL   Lymphocytes Relative 27 %   Lymphs Abs 1.9 0.7 - 4.0 K/uL   Monocytes Relative 8 %   Monocytes Absolute 0.5 0.1 - 1.0 K/uL   Eosinophils Relative 2 %   Eosinophils Absolute 0.1 0.0 - 0.5 K/uL   Basophils Relative 1 %   Basophils Absolute 0.1 0.0 - 0.1 K/uL   Immature Granulocytes 0 %   Abs Immature Granulocytes 0.02 0.00 - 0.07 K/uL    Comment: Performed at [facility], [address] [place], [place] [address]  CEA     Status: Abnormal   Collection Time: 12/21/19 10:28 AM  Result Value Ref Range   CEA 7.4 (H) 0.0 - 4.7 ng/mL    Comment: (NOTE)  Nonsmokers          <3.9                             Smokers             <5.6 [employer] Electrochemiluminescence Immunoassay (ECLIA) Values obtained with different assay methods or kits cannot be used interchangeably.  Results cannot be interpreted as absolute evidence of the presence or absence of malignant disease. Performed At: [facility] [place], [place] [id number] Rush Farmer MD DD:[id number]   POCT HgB A1C     Status: Abnormal   Collection Time: 03/12/20  1:20 PM  Result Value Ref Range   Hemoglobin A1C 9.8 (A) 4.0 - 5.6 %   HbA1c POC (<> result,  manual entry)     HbA1c, POC (prediabetic range)     HbA1c, POC (controlled diabetic range)        PHQ2/9: Depression screen [facility] 2/9 03/12/2020 11/12/2019 11/12/2019 07/03/2019 03/01/2019  Decreased Interest 0 1 0 1 0  Down, Depressed, Hopeless 0 1 0 1 2  PHQ - 2 Score 0 2 0 2 2  Altered sleeping 0 1 0 3 2  Tired, decreased energy 0 1 0 3 1  Change in appetite 0 0 0 0 1  Feeling bad or failure about yourself  0 0 0 0 0  Trouble concentrating 0 0 0 0 0  Moving slowly or fidgety/restless 0 0 0 0 0  Suicidal thoughts 0 0 0 0 0  PHQ-9 Score 0 4 0 8 6  Difficult doing work/chores - Not difficult at all - Not difficult at all Not difficult at all  Some recent data might be hidden    phq 9 is negative   Fall Risk: Fall Risk  03/12/2020 11/12/2019 11/12/2019 07/03/2019 10/31/2018  Falls in the past year? 0 0 0 0 0  Number falls in past yr: 0 0 0 0 0  Injury with Fall? 0 0 0 0 0  Comment - - - - -  Risk for fall due to : - - - - -  Risk for fall due to: Comment - - - - -  Follow up - - - Falls prevention discussed -     Functional Status Survey: Is the patient deaf or have difficulty hearing?: No Does the patient have difficulty seeing, even when wearing glasses/contacts?: No Does the patient have difficulty concentrating, remembering, or making decisions?: No Does the patient have difficulty walking or climbing stairs?: No Does the patient have difficulty dressing or bathing?: No Does the patient have difficulty doing errands alone such as visiting a doctor's office or shopping?: No    Assessment & Plan  1. DM (diabetes mellitus), type 2, uncontrolled, with renal complications (HCC)  - POCT HgB A1C - Insulin Glargine-Lixisenatide (SOLIQUA) 100-33 UNT-MCG/ML SOPN; Inject 10-50 units of lipase into the skin daily.  Dispense: 15 mL; Refill: 2 - Insulin Pen Needle 31G X 6 MM MISC; 1 each by Does not apply route daily.  Dispense: 100 each; Refill: 1  2. Mucopurulent chronic  bronchitis ([place])  Continue medication   3.  Major depression in remission ([place])   4. Morbid obesity ([place])  Discussed with the patient the risk posed by an increased BMI. Discussed importance of portion control, calorie counting and at least 150 minutes of physical activity weekly. Avoid sweet beverages and drink more water. Eat at least 6  servings of fruit and vegetables daily    5. B12 deficiency  On supplements  6. Benign essential HTN  bp towards low end of normal, she denies dizziness  7. Dyspnea on exertion  Discussed evaluation by cardiologist but she wants to hold off for now,  8. Bilateral lower extremity edema  Discussed compression stocking hoses

## 2020-03-12 NOTE — Patient Instructions (Signed)
Please stop Glipizide 10 mg and Pioglitazone 15 mg Start Soliqua at 10 units daily and go up by 2 units every 3 days to keep glucose between 100-140

## 2020-03-20 ENCOUNTER — Other Ambulatory Visit: Payer: Self-pay

## 2020-03-20 ENCOUNTER — Inpatient Hospital Stay: Payer: Medicare HMO | Attending: Internal Medicine

## 2020-03-20 DIAGNOSIS — C182 Malignant neoplasm of ascending colon: Secondary | ICD-10-CM

## 2020-03-20 DIAGNOSIS — Z85038 Personal history of other malignant neoplasm of large intestine: Secondary | ICD-10-CM | POA: Diagnosis not present

## 2020-03-20 DIAGNOSIS — R970 Elevated carcinoembryonic antigen [CEA]: Secondary | ICD-10-CM

## 2020-03-21 LAB — CEA: CEA: 7.5 ng/mL — ABNORMAL HIGH (ref 0.0–4.7)

## 2020-03-24 ENCOUNTER — Inpatient Hospital Stay (HOSPITAL_BASED_OUTPATIENT_CLINIC_OR_DEPARTMENT_OTHER): Payer: Medicare HMO | Admitting: Internal Medicine

## 2020-03-24 DIAGNOSIS — C182 Malignant neoplasm of ascending colon: Secondary | ICD-10-CM

## 2020-03-24 NOTE — Progress Notes (Signed)
I connected with Cindy Griffin on 03/24/2020 at  3:00 PM EDT by video enabled telemedicine visit and verified that I am speaking with the correct person using two identifiers.  I discussed the limitations, risks, security and privacy concerns of performing an evaluation and management service by telemedicine and the availability of in-person appointments. I also discussed with the patient that there may be a patient responsible charge related to this service. The patient expressed understanding and agreed to proceed.    Other persons participating in the visit and their role in the encounter: RN/medical reconciliation Patient's location: home Provider's location: office  Oncology History Overview Note  #June 2019- [place]- pT1pN 0/22 LN [Dr.white, [place]; screening; Dr. Vicente Males ]  # abnormal CEA  # MSI-HIGH; MLH-1 METHYLATION/sporadic  #Survivorship: p  DIAGNOSIS: [COLON CA  STAGE:  I       ;GOALS: cure  CURRENT/MOST RECENT THERAPY : surveIillaince    Colon cancer, ascending ([place])  05/25/2019 Initial Diagnosis   Colon cancer, ascending ([place])    Chief Complaint: Colon cancer  History of present illness:Cindy Griffin 74 y.o.  female with history of stage I colon cancer; and history of abnormal CEA is here for follow-up.  Patient denies any blood in stools or black or stools.  Appetite is good with no nausea no vomiting.  No bone pain.  Observation/objective: CEA 7.  Assessment and plan: Colon cancer, ascending ([place]) # Colon cancer-of ascending colon- stage I; MSI mismatch/sporadic- Excellent prognosis more than 90% chance of cure. January 2021 pelvis CT scan -no evidence of recurrence.  Continue colonoscopies as per GI.   #Abnormal CEA-around 7; overall stable.  Low clinical suspicion of any recurrent malignancy.  Monitor for now.  Hold off imaging at this time  # DISPOSITION:  # follow up in 3 months-MD; labs- CEA 2 days prior   Follow-up instructions:  I  discussed the assessment and treatment plan with the patient.  The patient was provided an opportunity to ask questions and all were answered.  The patient agreed with the plan and demonstrated understanding of instructions.  The patient was advised to call back or seek an in person evaluation if the symptoms worsen or if the condition fails to improve as anticipated.  Dr. Charlaine Dalton [place] at [facility] 03/30/2020 10:51 PM

## 2020-03-24 NOTE — Assessment & Plan Note (Addendum)
#  Colon cancer-of ascending colon- stage I; MSI mismatch/sporadic- Excellent prognosis more than 90% chance of cure. January 2021 pelvis CT scan -no evidence of recurrence.  Continue colonoscopies as per GI.   #Abnormal CEA-around 7; overall stable.  Low clinical suspicion of any recurrent malignancy.  Monitor for now.  Hold off imaging at this time  # DISPOSITION:  # follow up in 3 months-MD; labs- CEA 2 days prior

## 2020-03-25 DIAGNOSIS — M5136 Other intervertebral disc degeneration, lumbar region: Secondary | ICD-10-CM | POA: Diagnosis not present

## 2020-03-25 DIAGNOSIS — M5416 Radiculopathy, lumbar region: Secondary | ICD-10-CM | POA: Diagnosis not present

## 2020-03-25 DIAGNOSIS — M48062 Spinal stenosis, lumbar region with neurogenic claudication: Secondary | ICD-10-CM | POA: Diagnosis not present

## 2020-03-28 ENCOUNTER — Telehealth: Payer: Self-pay | Admitting: Internal Medicine

## 2020-03-28 NOTE — Telephone Encounter (Signed)
Patient phoned on this date and stated that she would not be able to attend appt on 06-25-20 in the morning and needed a later appt. Appt rescheduled to later in the day per patient's request.

## 2020-04-08 ENCOUNTER — Ambulatory Visit (INDEPENDENT_AMBULATORY_CARE_PROVIDER_SITE_OTHER): Payer: Medicare HMO | Admitting: Family Medicine

## 2020-04-08 ENCOUNTER — Encounter: Payer: Self-pay | Admitting: Family Medicine

## 2020-04-08 ENCOUNTER — Other Ambulatory Visit: Payer: Self-pay

## 2020-04-08 ENCOUNTER — Ambulatory Visit: Payer: Medicare HMO | Admitting: Family Medicine

## 2020-04-08 VITALS — BP 142/70 | HR 100 | Temp 97.1°F | Resp 16 | Ht 64.0 in | Wt 243.5 lb

## 2020-04-08 DIAGNOSIS — E1129 Type 2 diabetes mellitus with other diabetic kidney complication: Secondary | ICD-10-CM | POA: Diagnosis not present

## 2020-04-08 DIAGNOSIS — R600 Localized edema: Secondary | ICD-10-CM | POA: Diagnosis not present

## 2020-04-08 DIAGNOSIS — I10 Essential (primary) hypertension: Secondary | ICD-10-CM

## 2020-04-08 DIAGNOSIS — E1165 Type 2 diabetes mellitus with hyperglycemia: Secondary | ICD-10-CM

## 2020-04-08 DIAGNOSIS — R0601 Orthopnea: Secondary | ICD-10-CM | POA: Diagnosis not present

## 2020-04-08 DIAGNOSIS — IMO0002 Reserved for concepts with insufficient information to code with codable children: Secondary | ICD-10-CM

## 2020-04-08 NOTE — Progress Notes (Signed)
Name: Cindy Griffin   MRN: [id number]    DOB: 05-07-1946   Date:04/08/2020       Progress Note  Subjective  Chief Complaint  Chief Complaint  Patient presents with  . Diabetes  . Edema    HPI  DMII with nephropathy:  She was seen in our office 02/2020 and A1C was 9.8 %  she was gaining weight, having leg sweeling,  we will stop Actos and Glipizide and started [facility] and is currently on 30 units, she states glucose has been up and down 120's but sometimes goes up to 200's. She was eating Fig bars, but over the past couple of weeks she stopped eating that, also no longer buying bread, not baking. She denies polyphagia, polydipsia or polyuria. She also has gastroparesis but off metoclopramide Now having symptoms of peripheral neuropathy and we will try lyrica .She is on ACE for kidney protectionShe states since she stopped Actos she is doing better. She states Willeen Niece has helped curb her appetite also . She states also had steroid epidural injection with Dr. Phyllis Ginger and that caused elevation of glucose also   SOB and orthopnea: lower extremity has improved, but has been sleeping in her recliner, she has chronic cough but sob seems to be a little worse with activity   HTN: bp when she first arrived was high. When we recheck it gets back to normal   Patient Active Problem List   Diagnosis Date Noted  . Colon cancer, ascending ([place]) 05/25/2019  . Mild episode of recurrent major depressive disorder ([place]) 08/01/2018  . SBO (small bowel obstruction) and ileocolonic anastomosis from recent colectomy 05/22/2018  . Cancer of ascending colon - pT1N0 (0/21) s/p lap right colectomy 05/11/2018 05/21/2018  . Hypomagnesemia 05/21/2018  . Ileus following gastrointestinal surgery ([place]) 05/20/2018  . Essential hemorrhagic thrombocythemia ([place]) 03/29/2018  . Hypercalcemia 11/23/2015  . DDD (degenerative disc disease), lumbar 07/28/2015  . Spinal stenosis at L4-L5 level 06/12/2015  . Benign  essential HTN 05/25/2015  . Bunion 05/25/2015  . Cardiac enlargement 05/25/2015  . Cervical radicular pain 05/25/2015  . Back pain, chronic 05/25/2015  . Osteoarthritis 05/25/2015  . Dyslipidemia 05/25/2015  . Gastric reflux 05/25/2015  . Insomnia 05/25/2015  . Eczema intertrigo 05/25/2015  . Bronchitis, chronic, mucopurulent ([place]) 05/25/2015  . Numerous moles 05/25/2015  . Morbid obesity ([place]) 05/25/2015  . Perennial allergic rhinitis 05/25/2015  . DM (diabetes mellitus), type 2, uncontrolled, with renal complications ([place]) 99/37/1696  . Snores 05/25/2015  . History of artificial joint 05/25/2015  . Degeneration of intervertebral disc of cervical region 02/06/2015  . Neuritis or radiculitis due to rupture of lumbar intervertebral disc 01/14/2015  . Abnormal presence of protein in urine 05/04/2010  . Vitamin D deficiency 05/04/2010    Past Surgical History:  Procedure Laterality Date  . ABDOMINAL HYSTERECTOMY     due to cancer-partial  . BREAST EXCISIONAL BIOPSY Right yrs ago   Benign  . COLONOSCOPY N/A 04/11/2015   Procedure: COLONOSCOPY;  Surgeon: Hulen Luster, MD;  Location: [facility] ENDOSCOPY;  Service: Gastroenterology;  Laterality: N/A;  . COLONOSCOPY WITH PROPOFOL N/A 03/13/2018   Procedure: COLONOSCOPY WITH PROPOFOL;  Surgeon: [name], MD;  Location: [facility] ENDOSCOPY;  Service: Gastroenterology;  Laterality: N/A;  . COLONOSCOPY WITH PROPOFOL N/A 12/25/2018   Procedure: COLONOSCOPY WITH PROPOFOL;  Surgeon: [name], MD;  [facility];  Service: Gastroenterology;  Laterality: N/A;  . JOINT REPLACEMENT     left knee x3,  right knee 2005  .  LAPAROSCOPIC RIGHT COLECTOMY Right 05/11/2018   Procedure: LAPAROSCOPIC RIGHT HEMICOLECTOMY ERAS PATHWAY;  Surgeon: Ileana Roup, MD;  Location: [facility] ORS;  Service: General;  Laterality: Right;  . SHOULDER ARTHROSCOPY WITH OPEN ROTATOR CUFF REPAIR Left 05/18/2016   Procedure: SHOULDER ARTHROSCOPY WITH OPEN ROTATOR CUFF  REPAIR,distal clavicle excision, decompression;  Surgeon: Corky Mull, MD;  Location: [facility] ORS;  Service: Orthopedics;  Laterality: Left;  . SPINAL FUSION  1994   C-Spine  . Transforaminal Epidural  02/20/2015   Injection into cervical spine- C5-6 Dr. Phyllis Ginger    Family History  Problem Relation Age of Onset  . Diabetes Mother   . Kidney disease Mother   . Hypertension Mother   . Healthy Father   . Asthma Daughter   . Diabetes Sister   . Kidney disease Sister   . Pancreatic cancer Sister   . Diabetes Brother     Social History   Tobacco Use  . Smoking status: Never Smoker  . Smokeless tobacco: Never Used  . Tobacco comment: smoking cessation materials not required  Substance Use Topics  . Alcohol use: Not Currently    Alcohol/week: 0.0 standard drinks     Current Outpatient Medications:  .  acetaminophen (TYLENOL) 500 MG tablet, Take 1,000 mg by mouth every 6 (six) hours as needed for moderate pain or headache. , Disp: , Rfl:  .  Ascorbic Acid (VITAMIN C) 1000 MG tablet, Take 1,000 mg by mouth daily., Disp: , Rfl:  .  aspirin EC 81 MG tablet, Take 1 tablet (81 mg total) by mouth daily., Disp: 90 tablet, Rfl: 1 .  atorvastatin (LIPITOR) 80 MG tablet, Take 1 tablet (80 mg total) by mouth daily., Disp: 90 tablet, Rfl: 1 .  Carboxymethylcellul-Glycerin (LUBRICATING EYE DROPS OP), Place 2 drops into both eyes 2 (two) times daily as needed (for dry eyes)., Disp: , Rfl:  .  cetirizine (ZYRTEC) 10 MG tablet, Take 10 mg by mouth daily as needed for allergies. , Disp: , Rfl:  .  Cholecalciferol (VITAMIN D-3) 1000 units CAPS, Take 1,000 Units by mouth daily. , Disp: , Rfl:  .  famotidine (PEPCID) 20 MG tablet, TAKE 1 TABLET BY MOUTH TWICE DAILY IN  PLACE  OF  RANITIDINE, Disp: 180 tablet, Rfl: 1 .  fluticasone (FLONASE) 50 MCG/ACT nasal spray, USE TWO SPRAY IN EACH NOSTRIL ONCE DAILY, Disp: 16 g, Rfl: 0 .  Glycopyrrolate-Formoterol (BEVESPI AEROSPHERE) 9-4.8 MCG/ACT AERO, Inhale 2  puffs into the lungs at bedtime., Disp: 10.7 g, Rfl: 2 .  hydrochlorothiazide (HYDRODIURIL) 25 MG tablet, Take 1 tablet (25 mg total) by mouth daily., Disp: 90 tablet, Rfl: 1 .  Insulin Glargine-Lixisenatide (SOLIQUA) 100-33 UNT-MCG/ML SOPN, Inject 10-50 units of lipase into the skin daily., Disp: 15 mL, Rfl: 2 .  Insulin Pen Needle 31G X 6 MM MISC, 1 each by Does not apply route daily., Disp: 100 each, Rfl: 1 .  Lancets (ONETOUCH DELICA PLUS [id number]) MISC, CHECK GLUCOSE THREE TIMES DAILY, Disp: , Rfl:  .  lisinopril (ZESTRIL) 40 MG tablet, Take 1 tablet (40 mg total) by mouth at bedtime., Disp: 90 tablet, Rfl: 1 .  meclizine (ANTIVERT) 25 MG tablet, Take 1 tablet (25 mg total) by mouth every 6 (six) hours., Disp: 20 tablet, Rfl: 0 .  metFORMIN (GLUCOPHAGE-XR) 750 MG 24 hr tablet, Take 2 tablets (1,500 mg total) by mouth daily with breakfast., Disp: 180 tablet, Rfl: 1 .  methocarbamol (ROBAXIN) 750 MG tablet, Take by mouth., Disp: , Rfl:  .  Multiple Vitamins-Minerals (MULTIVITAMIN PO), Take 1 tablet by mouth daily., Disp: , Rfl:  .  Omega-3 1000 MG CAPS, Take by mouth., Disp: , Rfl:  .  Omega-3 Fatty Acids (OMEGA-3 CF PO), Take 660 mg by mouth daily. Over the counter product, Disp: , Rfl:  .  pregabalin (LYRICA) 75 MG capsule, Take 1 capsule (75 mg total) by mouth 2 (two) times daily., Disp: 180 capsule, Rfl: 1 .  PROAIR HFA 108 (90 Base) MCG/ACT inhaler, INHALE 2 PUFFS INTO LUNGS EVERY 6 HOURS AS NEEDED FOR WHEEZING OR SHORTNESS OF BREATH, Disp: 9 g, Rfl: 2 .  traMADol (ULTRAM) 50 MG tablet, Take 1 tablet by mouth 3 (three) times daily as needed., Disp: , Rfl: 3 .  traZODone (DESYREL) 50 MG tablet, TAKE ONE-HALF TO ONE TABLET BY MOUTH AT BEDTIME AS NEEDED FOR SLEEP, Disp: 90 tablet, Rfl: 0  No Known Allergies  I personally reviewed active problem list, medication list, allergies, family history, social history, health maintenance with the patient/caregiver today.   ROS  Constitutional:  Negative for fever or significant weight change.  Respiratory: stable chronic cough and shortness of breath ( but stable) .   Cardiovascular: Negative for chest pain or palpitations.  Gastrointestinal: Negative for abdominal pain, no bowel changes.  Musculoskeletal: Negative for gait problem or joint swelling.  Skin: Negative for rash.  Neurological: Negative for dizziness or headache.  No other specific complaints in a complete review of systems (except as listed in HPI above).  Objective  Vitals:   04/08/20 1318 04/08/20 1403  BP: (!) 150/80 (!) 142/70  Pulse: 100   Resp: 16   Temp: (!) 97.1 F (36.2 C)   TempSrc: Temporal   SpO2: 96%   Weight: 243 lb 8 oz (110.5 kg)   Height: 5\' 4"  (1.626 m)     Body mass index is 41.8 kg/m.  Physical Exam  Constitutional: Patient appears well-developed and well-nourished. Obese  No distress.  HEENT: head atraumatic, normocephalic, pupils equal and reactive to light Cardiovascular: Normal rate, regular rhythm and normal heart sounds.  No murmur heard. Trace BLE edema. Pulmonary/Chest: Effort normal and breath sounds normal. No respiratory distress. Abdominal: Soft.  There is no tenderness. Psychiatric: Patient has a normal mood and affect. behavior is normal. Judgment and thought content normal.  Recent Results (from the past 2160 hour(s))  POCT HgB A1C     Status: Abnormal   Collection Time: 03/12/20  1:20 PM  Result Value Ref Range   Hemoglobin A1C 9.8 (A) 4.0 - 5.6 %   HbA1c POC (<> result, manual entry)     HbA1c, POC (prediabetic range)     HbA1c, POC (controlled diabetic range)    CEA     Status: Abnormal   Collection Time: 03/20/20 11:26 AM  Result Value Ref Range   CEA 7.5 (H) 0.0 - 4.7 ng/mL    Comment: (NOTE)                             Nonsmokers          <3.9                             Smokers             <5.6 Roche [employer]ostics Electrochemiluminescence Immunoassay (ECLIA) Values obtained with different assay  methods or kits cannot be used interchangeably.  Results cannot be  interpreted as absolute evidence of the presence or absence of malignant disease. Performed At: [facility] [place], [place] [id number] Rush Farmer MD MV:[id number]     PHQ2/9: Depression screen [facility] 2/9 04/08/2020 03/12/2020 11/12/2019 11/12/2019 07/03/2019  Decreased Interest 0 0 1 0 1  Down, Depressed, Hopeless 0 0 1 0 1  PHQ - 2 Score 0 0 2 0 2  Altered sleeping 0 0 1 0 3  Tired, decreased energy 0 0 1 0 3  Change in appetite 0 0 0 0 0  Feeling bad or failure about yourself  0 0 0 0 0  Trouble concentrating 0 0 0 0 0  Moving slowly or fidgety/restless 0 0 0 0 0  Suicidal thoughts 0 0 0 0 0  PHQ-9 Score 0 0 4 0 8  Difficult doing work/chores - - Not difficult at all - Not difficult at all  Some recent data might be hidden    phq 9 is negative   Fall Risk: Fall Risk  04/08/2020 03/12/2020 11/12/2019 11/12/2019 07/03/2019  Falls in the past year? 0 0 0 0 0  Number falls in past yr: 0 0 0 0 0  Injury with Fall? 0 0 0 0 0  Comment - - - - -  Risk for fall due to : - - - - -  Risk for fall due to: Comment - - - - -  Follow up - - - - Falls prevention discussed     Functional Status Survey: Is the patient deaf or have difficulty hearing?: No Does the patient have difficulty seeing, even when wearing glasses/contacts?: No Does the patient have difficulty concentrating, remembering, or making decisions?: No Does the patient have difficulty walking or climbing stairs?: No Does the patient have difficulty dressing or bathing?: No Does the patient have difficulty doing errands alone such as visiting a doctor's office or shopping?: No    Assessment & Plan  1. DM (diabetes mellitus), type 2, uncontrolled, with renal complications (HCC)  Continue titrating insulin  2. Orthopnea  - Ambulatory referral to Cardiology  3. Bilateral lower extremity edema  - Ambulatory referral to  Cardiology  4. Benign essential HTN  Improved with rest, continue current medications

## 2020-04-15 DIAGNOSIS — M5136 Other intervertebral disc degeneration, lumbar region: Secondary | ICD-10-CM | POA: Diagnosis not present

## 2020-04-15 DIAGNOSIS — M48062 Spinal stenosis, lumbar region with neurogenic claudication: Secondary | ICD-10-CM | POA: Diagnosis not present

## 2020-04-15 DIAGNOSIS — M5416 Radiculopathy, lumbar region: Secondary | ICD-10-CM | POA: Diagnosis not present

## 2020-04-16 ENCOUNTER — Telehealth: Payer: Self-pay | Admitting: Family Medicine

## 2020-04-16 NOTE — Chronic Care Management (AMB) (Signed)
  Chronic Care Management   Note  04/16/2020 Name: Cindy Griffin MRN: [id number] DOB: 09/22/1946  Cindy Griffin is a 74 y.o. year old female who is a primary care patient of Steele Sizer, MD and is actively engaged with the care management team. I reached out to [name] by phone today to assist with scheduling an initial visit with the Pharmacist.  Follow up plan: Unsuccessful telephone outreach attempt made. A HIPPA compliant phone message was left for the patient providing contact information and requesting a return call. The care management team will reach out to the patient again over the next 7 days.  If patient returns call to provider office, please advise to call [place] at [phone].  [place], [place] [address] Direct Dial: [phone] [email or web address]

## 2020-04-21 NOTE — Chronic Care Management (AMB) (Signed)
  Chronic Care Management   Note  04/21/2020 Name: Cindy Griffin MRN: [id number] DOB: [birth year]  Cindy Griffin is a 74 y.o. year old female who is a primary care patient of Steele Sizer, MD. I reached out to [name] by phone today in response to a referral sent by [name]'s health plan.     [name] was given information about Chronic Care Management services today including:  1. CCM service includes personalized support from designated clinical staff supervised by her physician, including individualized plan of care and coordination with other care providers 2. 24/7 contact phone numbers for assistance for urgent and routine care needs. 3. Service will only be billed when office clinical staff spend 20 minutes or more in a month to coordinate care. 4. Only one practitioner may furnish and bill the service in a calendar month. 5. The patient may stop CCM services at any time (effective at the end of the month) by phone call to the office staff. 6. The patient will be responsible for cost sharing (co-pay) of up to 20% of the service fee (after annual deductible is met).  Patient agreed to services and verbal consent obtained.   Follow up plan: Telephone appointment with care management team member scheduled for: 05/21/2020.   [place], [place] [address] Direct Dial: [phone] [email or web address]: [email or web address]

## 2020-04-21 NOTE — Chronic Care Management (AMB) (Signed)
  Chronic Care Management   Outreach Note  04/21/2020 Name: RONDELL FRICK MRN: [id number] DOB: 1946-09-02  MADHAVI HAMBLEN is a 74 y.o. year old female who is a primary care patient of Steele Sizer, MD. I reached out to [name] by phone today in response to a referral sent by Ms. Royetta Crochet [place]'s health plan.     A second unsuccessful telephone outreach was attempted today. The patient was referred to the case management team for assistance with care management and care coordination.   Follow Up Plan: A HIPPA compliant phone message was left for the patient providing contact information and requesting a return call. The care management team will reach out to the patient again over the next 7 days.  If patient returns call to provider office, please advise to call [place] at [phone].  [place], [place] [address] Direct Dial: [phone] [email or web address]: [email or web address]

## 2020-04-22 ENCOUNTER — Other Ambulatory Visit: Payer: Self-pay | Admitting: Family Medicine

## 2020-04-22 DIAGNOSIS — E785 Hyperlipidemia, unspecified: Secondary | ICD-10-CM

## 2020-04-22 DIAGNOSIS — I10 Essential (primary) hypertension: Secondary | ICD-10-CM

## 2020-04-22 DIAGNOSIS — E1121 Type 2 diabetes mellitus with diabetic nephropathy: Secondary | ICD-10-CM

## 2020-04-22 DIAGNOSIS — E1142 Type 2 diabetes mellitus with diabetic polyneuropathy: Secondary | ICD-10-CM

## 2020-04-22 DIAGNOSIS — E1169 Type 2 diabetes mellitus with other specified complication: Secondary | ICD-10-CM

## 2020-04-22 NOTE — Telephone Encounter (Signed)
Requested medication (s) are due for refill today: yes  Requested medication (s) are on the active medication list: yes  Last refill:  lisinopril 11/12/19, Metformin 08/04/19  Future visit scheduled: yes  Notes to clinic:  Patient has had sharp jump in Creat.    Requested Prescriptions  Pending Prescriptions Disp Refills   lisinopril (ZESTRIL) 40 MG tablet [Pharmacy Med Name: Lisinopril 40 MG Oral Tablet] 90 tablet 0    Sig: TAKE 1 TABLET BY MOUTH AT BEDTIME      Cardiovascular:  ACE Inhibitors Failed - 04/22/2020  2:09 PM      Failed - Cr in normal range and within 180 days    Creat  Date Value Ref Range Status  11/12/2019 0.76 0.60 - 0.93 mg/dL Final    Comment:    For patients >74 years of age, the reference limit for Creatinine is approximately 13% higher for people identified as African-American. .    Creatinine, Ser  Date Value Ref Range Status  12/21/2019 1.01 (H) 0.44 - 1.00 mg/dL Final   Creatinine, Urine  Date Value Ref Range Status  11/12/2019 77 20 - 275 mg/dL Final          Failed - Last BP in normal range    BP Readings from Last 1 Encounters:  04/08/20 (!) 142/70          Passed - K in normal range and within 180 days    Potassium  Date Value Ref Range Status  12/21/2019 4.4 3.5 - 5.1 mmol/L Final          Passed - Patient is not pregnant      Passed - Valid encounter within last 6 months    Recent Outpatient Visits           2 weeks ago DM (diabetes mellitus), type 2, uncontrolled, with renal complications [facility])   [facility] [place], Drue Stager, MD   1 month ago DM (diabetes mellitus), type 2, uncontrolled, with renal complications [facility])   [facility] [place], Drue Stager, MD   5 months ago DM (diabetes mellitus), type 2, uncontrolled, with renal complications [facility])   [facility] [place], Drue Stager, MD   9 months ago DM (diabetes mellitus), type 2, uncontrolled, with renal complications  [facility])   [facility] [place], Drue Stager, MD   1 year ago Controlled type 2 diabetes mellitus with diabetic nephropathy, without long-term current use of insulin [facility])   [facility] [place], Drue Stager, MD       Future Appointments             In 1 month Ancil Boozer, Drue Stager, MD [facility]              metFORMIN (GLUCOPHAGE-XR) 750 MG 24 hr tablet [Pharmacy Med Name: metFORMIN HCl ER 750 MG Oral Tablet Extended Release 24 Hour] 180 tablet 0    Sig: TAKE 2 TABLETS BY MOUTH ONCE DAILY WITH BREAKFAST      Endocrinology:  Diabetes - Biguanides Failed - 04/22/2020  2:09 PM      Failed - Cr in normal range and within 360 days    Creat  Date Value Ref Range Status  11/12/2019 0.76 0.60 - 0.93 mg/dL Final    Comment:    For patients >74 years of age, the reference limit for Creatinine is approximately 13% higher for people identified as African-American. .    Creatinine, Ser  Date Value Ref Range  Status  12/21/2019 1.01 (H) 0.44 - 1.00 mg/dL Final   Creatinine, Urine  Date Value Ref Range Status  11/12/2019 77 20 - 275 mg/dL Final          Failed - HBA1C is between 0 and 7.9 and within 180 days    Hemoglobin A1C  Date Value Ref Range Status  03/12/2020 9.8 (A) 4.0 - 5.6 % Final   HbA1c, POC (controlled diabetic range)  Date Value Ref Range Status  07/03/2019 9.3 (A) 0.0 - 7.0 % Final          Passed - eGFR in normal range and within 360 days    GFR, Est African American  Date Value Ref Range Status  11/12/2019 90 > OR = 60 mL/min/1.20m Final   GFR calc Af Amer  Date Value Ref Range Status  12/21/2019 >60 >60 mL/min Final   GFR, Est Non African American  Date Value Ref Range Status  11/12/2019 78 > OR = 60 mL/min/1.720mFinal   GFR calc non Af Amer  Date Value Ref Range Status  12/21/2019 55 (L) >60 mL/min Final          Passed - Valid encounter within last 6 months    Recent Outpatient Visits            2 weeks ago DM (diabetes mellitus), type 2, uncontrolled, with renal [facility]  [facility]   1 month ago DM (diabetes mellitus), type 2, uncontrolled, with [facility]  [facility]   5 months ago DM (diabetes mellitus), type 2, uncontrolled, with renal [facility] ([facility]  [facility]   9 months ago DM (diabetes mellitus), type 2, uncontrolled, with renal [facility] ([facility]  [facility]   1 year ago Controlled type 2 diabetes mellitus with diabetic nephropathy, without long-term current use of insulin ([facility]  [facility]       Future Appointments             In 1 month SoAncil BoozerKrDrue StagerMD [facility]             Signed Prescriptions Disp Refills   atorvastatin (LIPITOR) 80 MG tablet 90 tablet 0    Sig: Take 1 tablet by mouth once daily      Cardiovascular:  Antilipid - Statins Failed - 04/22/2020  2:09 PM      Failed - Triglycerides in normal range and within 360 days    Triglycerides  Date Value Ref Range Status  11/12/2019 169 (H) <150 mg/dL Final          Passed - Total Cholesterol in normal range and within 360 days    Cholesterol, Total  Date Value Ref Range Status  11/21/2015 146 100 - 199 mg/dL Final   Cholesterol  Date Value Ref Range Status  11/12/2019 125 <200 mg/dL Final          Passed - LDL in normal range and within 360 days    LDL Cholesterol (Calc)  Date Value Ref Range Status  11/12/2019 43 mg/dL (calc) Final    Comment:    Reference range: <100 . Desirable range <100 mg/dL for primary prevention;   <70 mg/dL for patients with CHD or diabetic patients  with > or = 2 CHD risk factors. . [name] KitchenDL-C is now calculated using  the Martin-Hopkins  calculation, which is a validated novel method  providing  better accuracy than the Friedewald equation in the  estimation of LDL-C.  Cresenciano Genre et al. Annamaria Helling. 8403;353(31): 2061-2068  (http://education.[employer].com/faq/FAQ164)           Passed - HDL in normal range and within 360 days    HDL  Date Value Ref Range Status  11/12/2019 57 > OR = 50 mg/dL Final  11/21/2015 62 >39 mg/dL Final          Passed - Patient is not pregnant      Passed - Valid encounter within last 12 months    Recent Outpatient Visits           2 weeks ago DM (diabetes mellitus), type 2, uncontrolled, with renal complications [facility])   [facility] [place], Drue Stager, MD   1 month ago DM (diabetes mellitus), type 2, uncontrolled, with renal complications [facility])   [facility] [place], Drue Stager, MD   5 months ago DM (diabetes mellitus), type 2, uncontrolled, with renal complications [facility])   [facility] [place], Drue Stager, MD   9 months ago DM (diabetes mellitus), type 2, uncontrolled, with renal complications [facility])   [facility] [place], Drue Stager, MD   1 year ago Controlled type 2 diabetes mellitus with diabetic nephropathy, without long-term current use of insulin [facility])   [facility] Steele Sizer, MD       Future Appointments             In 1 month Ancil Boozer, Drue Stager, MD [facility], [facility]

## 2020-04-27 ENCOUNTER — Other Ambulatory Visit: Payer: Self-pay | Admitting: Family Medicine

## 2020-04-27 DIAGNOSIS — E1142 Type 2 diabetes mellitus with diabetic polyneuropathy: Secondary | ICD-10-CM

## 2020-04-27 DIAGNOSIS — I10 Essential (primary) hypertension: Secondary | ICD-10-CM

## 2020-05-01 DIAGNOSIS — R600 Localized edema: Secondary | ICD-10-CM | POA: Insufficient documentation

## 2020-05-01 DIAGNOSIS — Z7689 Persons encountering health services in other specified circumstances: Secondary | ICD-10-CM | POA: Diagnosis not present

## 2020-05-01 DIAGNOSIS — R0602 Shortness of breath: Secondary | ICD-10-CM | POA: Insufficient documentation

## 2020-05-01 DIAGNOSIS — R0789 Other chest pain: Secondary | ICD-10-CM | POA: Diagnosis not present

## 2020-05-01 DIAGNOSIS — G4730 Sleep apnea, unspecified: Secondary | ICD-10-CM | POA: Diagnosis not present

## 2020-05-01 DIAGNOSIS — E782 Mixed hyperlipidemia: Secondary | ICD-10-CM | POA: Diagnosis not present

## 2020-05-01 DIAGNOSIS — I10 Essential (primary) hypertension: Secondary | ICD-10-CM | POA: Diagnosis not present

## 2020-05-07 DIAGNOSIS — M48062 Spinal stenosis, lumbar region with neurogenic claudication: Secondary | ICD-10-CM | POA: Diagnosis not present

## 2020-05-07 DIAGNOSIS — M5030 Other cervical disc degeneration, unspecified cervical region: Secondary | ICD-10-CM | POA: Diagnosis not present

## 2020-05-07 DIAGNOSIS — M5416 Radiculopathy, lumbar region: Secondary | ICD-10-CM | POA: Diagnosis not present

## 2020-05-07 DIAGNOSIS — M5136 Other intervertebral disc degeneration, lumbar region: Secondary | ICD-10-CM | POA: Diagnosis not present

## 2020-05-19 DIAGNOSIS — R0602 Shortness of breath: Secondary | ICD-10-CM | POA: Diagnosis not present

## 2020-05-19 DIAGNOSIS — Z01818 Encounter for other preprocedural examination: Secondary | ICD-10-CM | POA: Diagnosis not present

## 2020-05-21 ENCOUNTER — Other Ambulatory Visit: Payer: Self-pay

## 2020-05-21 ENCOUNTER — Ambulatory Visit: Payer: Medicare HMO | Admitting: Pharmacist

## 2020-05-21 DIAGNOSIS — J411 Mucopurulent chronic bronchitis: Secondary | ICD-10-CM

## 2020-05-21 DIAGNOSIS — E1129 Type 2 diabetes mellitus with other diabetic kidney complication: Secondary | ICD-10-CM

## 2020-05-21 DIAGNOSIS — IMO0002 Reserved for concepts with insufficient information to code with codable children: Secondary | ICD-10-CM

## 2020-05-21 NOTE — Chronic Care Management (AMB) (Signed)
Chronic Care Management Pharmacy  Name: Cindy Griffin  MRN: [id number] DOB: 10-03-1946  Chief Complaint/ HPI  [name],  74 y.o. , female presents for their Initial CCM visit with the clinical pharmacist via telephone due to COVID-19 Pandemic.  PCP : Steele Sizer, MD  Their chronic conditions include: COPD, DM, Chronic Pain  Office Visits: 5/11 DM, Sowles, BP 142/70 P 100, A1c 9.8%, d/c Actos, glipizide, start Bantam 30u daily, neuropathy,Lyrica,   Consult Visit: 6/21 SOBOE, [place],Wt 243 BMI 43.0, FVC 89%, FEV1 92%, FEF 110% TLC 52% RV 26%, must lose weight 6/9 DDD, Chasnis, BP 131/76 P 77 Wt 238 BMI 40.9, Ultram 13m bid prn, failed Celebrex, Flexril, gabapentin, Cymbalta, Lyrica, Covid vaccinated, not interested in tx changes 6/3 LEE, Kowalski, BP 120/80 P 95 Wt 238, Lasix 251mdaily  Medications: Outpatient Encounter Medications as of 05/21/2020  Medication Sig  . acetaminophen (TYLENOL) 500 MG tablet Take 1,000 mg by mouth every 6 (six) hours as needed for moderate pain or headache.   . Ascorbic Acid (VITAMIN C) 1000 MG tablet Take 1,000 mg by mouth daily.  . [name] Kitchenspirin EC 81 MG tablet Take 1 tablet (81 mg total) by mouth daily.  . [name] Kitchentorvastatin (LIPITOR) 80 MG tablet Take 1 tablet by mouth once daily  . Carboxymethylcellul-Glycerin (LUBRICATING EYE DROPS OP) Place 2 drops into both eyes 2 (two) times daily as needed (for dry eyes).  . cetirizine (ZYRTEC) 10 MG tablet Take 10 mg by mouth every other day as needed for allergies.   . Cholecalciferol (VITAMIN D-3) 1000 units CAPS Take 1,000 Units by mouth daily.   . famotidine (PEPCID) 20 MG tablet TAKE 1 TABLET BY MOUTH TWICE DAILY IN  PLACE  OF  RANITIDINE  . fluticasone (FLONASE) 50 MCG/ACT nasal spray USE TWO SPRAY IN EACH NOSTRIL ONCE DAILY (Patient taking differently: every other day. )  . furosemide (LASIX) 20 MG tablet Take 20 mg by mouth daily.  . Glycopyrrolate-Formoterol (BEVESPI AEROSPHERE) 9-4.8 MCG/ACT AERO  Inhale 2 puffs into the lungs at bedtime.  . Insulin Glargine-Lixisenatide (SOLIQUA) 100-33 UNT-MCG/ML SOPN Inject 10-50 units of lipase into the skin daily. (Patient taking differently: Inject 52 units of lipase into the skin daily. )  . Insulin Pen Needle 31G X 6 MM MISC 1 each by Does not apply route daily.  . Lancets (ONETOUCH DELICA PLUS [id number] CHECK GLUCOSE THREE TIMES DAILY  . lisinopril (ZESTRIL) 40 MG tablet TAKE 1 TABLET BY MOUTH AT BEDTIME  . meclizine (ANTIVERT) 25 MG tablet Take 1 tablet (25 mg total) by mouth every 6 (six) hours. (Patient taking differently: Take 25 mg by mouth daily as needed. )  . metFORMIN (GLUCOPHAGE-XR) 750 MG 24 hr tablet TAKE 2 TABLETS BY MOUTH ONCE DAILY WITH BREAKFAST  . methocarbamol (ROBAXIN) 750 MG tablet Take by mouth.  . Multiple Vitamins-Minerals (MULTIVITAMIN PO) Take 1 tablet by mouth daily.  . Omega-3 1000 MG CAPS Take by mouth.  . pregabalin (LYRICA) 75 MG capsule Take 1 capsule (75 mg total) by mouth 2 (two) times daily.  . [name] KitchenROAIR HFA 108 (90 Base) MCG/ACT inhaler INHALE 2 PUFFS INTO LUNGS EVERY 6 HOURS AS NEEDED FOR WHEEZING OR SHORTNESS OF BREATH  . traMADol (ULTRAM) 50 MG tablet Take 1 tablet by mouth 2 (two) times daily as needed.   . traZODone (DESYREL) 50 MG tablet TAKE ONE-HALF TO ONE TABLET BY MOUTH AT BEDTIME AS NEEDED FOR SLEEP  . hydrochlorothiazide (HYDRODIURIL) 25 MG tablet Take 1  tablet (25 mg total) by mouth daily. (Patient not taking: Reported on 05/21/2020)  . Omega-3 Fatty Acids (OMEGA-3 CF PO) Take 660 mg by mouth daily. Over the counter product (Patient not taking: Reported on 05/21/2020)   No facility-administered encounter medications on file as of 05/21/2020.      Financial Resource Strain: High Risk  . Difficulty of Paying Living Expenses: Very hard    Current Diagnosis/Assessment:  Goals Addressed            This Visit's Progress   . Chronic Care Management       CARE PLAN ENTRY (see longitudinal plan  of care for additional care plan information)  Current Barriers:  . Chronic Disease Management support, education, and care coordination needs related to Hyperlipidemia, Diabetes, and COPD   Hypertension BP Readings from Last 3 Encounters:  04/08/20 (!) 142/70  03/12/20 116/60  12/21/19 129/77   . Pharmacist Clinical Goal(s): o Over the next 90 days, patient will work with PharmD and providers to maintain BP goal <140/90 . Current regimen:  o Lisinopril 56m daily . Interventions: o None . Patient self care activities - Over the next 90 days, patient will: o Check BP weekly, document, and provide at future appointments o Ensure daily salt intake < 2300 mg/day  Hyperlipidemia Lab Results  Component Value Date/Time   LDLCALC 43 11/12/2019 12:00 AM   . Pharmacist Clinical Goal(s): o Over the next 90 days, patient will work with PharmD and providers to maintain LDL goal < 70 . Current regimen:  o Lipitor 834mdaily . Interventions: o None . Patient self care activities - Over the next 90 days, patient will: o Continue to take atorvastatin 8011maily o Report any unexplained muscle pain to PharmD or provider  Diabetes Lab Results  Component Value Date/Time   HGBA1C 9.8 (A) 03/12/2020 01:20 PM   HGBA1C 8.4 (A) 11/12/2019 02:49 PM   HGBA1C 9.3 (A) 07/03/2019 09:19 AM   HGBA1C 6.9 03/01/2019 10:05 AM   . Pharmacist Clinical Goal(s): o Over the next 90 days, patient will work with PharmD and providers to achieve A1c goal <7% . Current regimen:  o Soliqua 52 units inject daily o Metformin XR 1500m8mily . Interventions: o None . Patient self care activities - Over the next 90 days, patient will: o Check blood sugar twice daily, document, and provide at future appointments o Contact provider with any episodes of hypoglycemia o Contact PharmD regarding affordability  COPD . Pharmacist Clinical Goal(s) o Over the next 90 days, patient will work with PharmD and providers  to reduce dependence on rescue inhaler . Current regimen:  o Bevespi 2 puffs at bedtime o ProAir 2 puffs four times daily as needed for shortness of breath . Interventions: o None . Patient self care activities - Over the next 90 days, patient will: o Attempt to reduce dependence on rescue inhaler o If shortness of breath worsens, on less ProAir, contact PharmD or provider to evaluate maintenance inhaler change  Medication management . Pharmacist Clinical Goal(s): o Over the next 90 days, patient will work with PharmD and providers to maintain optimal medication adherence . Current pharmacy: [facility] . Interventions o Comprehensive medication review performed. o Continue current medication management strategy . Patient self care activities - Over the next 90 days, patient will: o Focus on medication adherence by monitoring copay amounts o Take medications as prescribed o Report any questions, affordability issues or concerns to PharmD and/or provider(s)  Initial goal documentation  Diabetes   Recent Relevant Labs: Lab Results  Component Value Date/Time   HGBA1C 9.8 (A) 03/12/2020 01:20 PM   HGBA1C 8.4 (A) 11/12/2019 02:49 PM   HGBA1C 9.3 (A) 07/03/2019 09:19 AM   HGBA1C 6.9 03/01/2019 10:05 AM   MICROALBUR 0.6 11/12/2019 12:00 AM   MICROALBUR 1.4 10/31/2018 12:06 PM   MICROALBUR 50 07/22/2017 09:00 AM   MICROALBUR 50 07/15/2016 10:52 AM     Checking BG: Rarely  Recent FBG Readings: 120 Patient is currently uncontrolled on the following medications: Soliqua  Last diabetic Foot exam:  Lab Results  Component Value Date/Time   HMDIABEYEEXA No Retinopathy 10/04/2019 12:00 AM    Last diabetic Eye exam: No results found for: HMDIABFOOTEX   We discussed:  Where in Steger titration? 52 units (beyond sig) Feels low at 120 A1c does not reflect current regimen  Plan  Continue current medications   Hyperlipidemia   LDL goal < 70  Lipid Panel     Component  Value Date/Time   CHOL 125 11/12/2019 0000   CHOL 146 11/21/2015 1125   TRIG 169 (H) 11/12/2019 0000   HDL 57 11/12/2019 0000   HDL 62 11/21/2015 1125   LDLCALC 43 11/12/2019 0000    Hepatic Function Latest Ref Rng & Units 12/21/2019 11/12/2019 07/03/2019  Total Protein 6.5 - 8.1 g/dL 7.3 6.9 7.4  Albumin 3.5 - 5.0 g/dL 4.2 - -  AST 15 - 41 U/L 22 12 17   ALT 0 - 44 U/L 20 13 15   Alk Phosphatase 38 - 126 U/L 77 - -  Total Bilirubin 0.3 - 1.2 mg/dL 0.6 0.3 0.3     The ASCVD Risk score Mikey Bussing DC Jr., et al., 2013) failed to calculate for the following reasons:   The valid total cholesterol range is 130 to 320 mg/dL   Patient has failed these meds in past: NA Patient is currently controlled on the following medications:  . Lipitor 15m daily  We discussed:   At goal LDL Denies myalgias  Plan  Continue current medications  Chronic Pain    Patient has failed these meds in past: NA Patient is currently uncontrolled on the following medications: Lyrica, Robaxin  We discussed:   Continue Tylenol extra strength as needed Continue Lyrica 75 mg twice daily. She has only been able to tolerate 1 capsule daily. Continue tramadol 50 milligrams twice daily as needed Takes Tylenol 10038mabout bid Robaxin about once weeklby  Plan  Increase to Lyrica 7583ms prescribed Continue current medications   COPD / Asthma / Tobacco   Last spirometry score: FEV1 92%  Eosinophil count:   Lab Results  Component Value Date/Time   EOSPCT 2 12/21/2019 10:28 AM  %                               Eos (Absolute):  Lab Results  Component Value Date/Time   EOSABS 0.1 12/21/2019 10:28 AM   EOSABS 0.2 11/21/2015 11:25 AM    Tobacco Status:  Social History   Tobacco Use  Smoking Status Never Smoker  Smokeless Tobacco Never Used  Tobacco Comment   smoking cessation materials not required    Patient has failed these meds in past: NA Patient is currently uncontrolled on the following  medications: Bevespi, ProAir Using maintenance inhaler regularly? Yes Frequency of rescue inhaler use:  multiple times per day  We discussed:   Taking Flonase qod Affordability of Bevespi, approaching  [place]  Plan  Continue current medications  Medication Management   Pt uses [facility] for all medications Uses pill box? Yes Pt endorses 100% compliance Disadvantaged to [employer]  We discussed:  Not taking iron Lubricant eye drops once daily Zyrtec every other day Needs calcium supplement No GERD  Add furosemide 28m daily Soliqua $45 Bevespi $45 ProAir $45 Antivert as needed (not as needed)  Nearing [employer]calcium citrate 400/400 mg/iu twice daily File patient assistance when in [place] May need to change meds. Continue current medication management strategy   Follow up: 3 month phone visit  TMilus Height PharmD, [facility] [phone]

## 2020-05-25 NOTE — Patient Instructions (Signed)
Visit Information  Goals Addressed            This Visit's Progress   . Chronic Care Management       CARE PLAN ENTRY (see longitudinal plan of care for additional care plan information)  Current Barriers:  . Chronic Disease Management support, education, and care coordination needs related to Hyperlipidemia, Diabetes, and COPD   Hypertension BP Readings from Last 3 Encounters:  04/08/20 (!) 142/70  03/12/20 116/60  12/21/19 129/77   . Pharmacist Clinical Goal(s): o Over the next 90 days, patient will work with PharmD and providers to maintain BP goal <140/90 . Current regimen:  o Lisinopril 40mg  daily . Interventions: o None . Patient self care activities - Over the next 90 days, patient will: o Check BP weekly, document, and provide at future appointments o Ensure daily salt intake < 2300 mg/day  Hyperlipidemia Lab Results  Component Value Date/Time   LDLCALC 43 11/12/2019 12:00 AM   . Pharmacist Clinical Goal(s): o Over the next 90 days, patient will work with PharmD and providers to maintain LDL goal < 70 . Current regimen:  o Lipitor 80mg  daily . Interventions: o None . Patient self care activities - Over the next 90 days, patient will: o Continue to take atorvastatin 80mg  daily o Report any unexplained muscle pain to PharmD or provider  Diabetes Lab Results  Component Value Date/Time   HGBA1C 9.8 (A) 03/12/2020 01:20 PM   HGBA1C 8.4 (A) 11/12/2019 02:49 PM   HGBA1C 9.3 (A) 07/03/2019 09:19 AM   HGBA1C 6.9 03/01/2019 10:05 AM   . Pharmacist Clinical Goal(s): o Over the next 90 days, patient will work with PharmD and providers to achieve A1c goal <7% . Current regimen:  o Soliqua 52 units inject daily o Metformin XR 1500mg  daily . Interventions: o None . Patient self care activities - Over the next 90 days, patient will: o Check blood sugar twice daily, document, and provide at future appointments o Contact provider with any episodes of  hypoglycemia o Contact PharmD regarding affordability  COPD . Pharmacist Clinical Goal(s) o Over the next 90 days, patient will work with PharmD and providers to reduce dependence on rescue inhaler . Current regimen:  o Bevespi 2 puffs at bedtime o ProAir 2 puffs four times daily as needed for shortness of breath . Interventions: o None . Patient self care activities - Over the next 90 days, patient will: o Attempt to reduce dependence on rescue inhaler o If shortness of breath worsens, on less ProAir, contact PharmD or provider to evaluate maintenance inhaler change  Medication management . Pharmacist Clinical Goal(s): o Over the next 90 days, patient will work with PharmD and providers to maintain optimal medication adherence . Current pharmacy: [facility] . Interventions o Comprehensive medication review performed. o Continue current medication management strategy . Patient self care activities - Over the next 90 days, patient will: o Focus on medication adherence by monitoring copay amounts o Take medications as prescribed o Report any questions, affordability issues or concerns to PharmD and/or provider(s)  Initial goal documentation        Ms. Fullington was given information about Chronic Care Management services today including:  1. CCM service includes personalized support from designated clinical staff supervised by her physician, including individualized plan of care and coordination with other care providers 2. 24/7 contact phone numbers for assistance for urgent and routine care needs. 3. Standard insurance, coinsurance, copays and deductibles apply for chronic care management only  during months in which we provide at least 20 minutes of these services. Most insurances cover these services at 100%, however patients may be responsible for any copay, coinsurance and/or deductible if applicable. This service may help you avoid the need for more expensive face-to-face  services. 4. Only one practitioner may furnish and bill the service in a calendar month. 5. The patient may stop CCM services at any time (effective at the end of the month) by phone call to the office staff.  Patient agreed to services and verbal consent obtained.   Print copy of patient instructions provided.  Telephone follow up appointment with pharmacy team member scheduled for: 3 months  Milus Height, PharmD, [place], [facility] [phone]

## 2020-05-29 ENCOUNTER — Ambulatory Visit (INDEPENDENT_AMBULATORY_CARE_PROVIDER_SITE_OTHER): Payer: Medicare HMO | Admitting: Family Medicine

## 2020-05-29 ENCOUNTER — Other Ambulatory Visit: Payer: Self-pay

## 2020-05-29 ENCOUNTER — Encounter: Payer: Self-pay | Admitting: Family Medicine

## 2020-05-29 VITALS — BP 128/72 | HR 90 | Temp 97.2°F | Resp 18 | Ht 64.0 in | Wt 240.5 lb

## 2020-05-29 DIAGNOSIS — E1165 Type 2 diabetes mellitus with hyperglycemia: Secondary | ICD-10-CM

## 2020-05-29 DIAGNOSIS — E1129 Type 2 diabetes mellitus with other diabetic kidney complication: Secondary | ICD-10-CM

## 2020-05-29 DIAGNOSIS — R600 Localized edema: Secondary | ICD-10-CM

## 2020-05-29 DIAGNOSIS — IMO0002 Reserved for concepts with insufficient information to code with codable children: Secondary | ICD-10-CM

## 2020-05-29 NOTE — Progress Notes (Signed)
Name: Cindy Griffin   MRN: [id number]    DOB: 1946-09-18   Date:05/29/2020       Progress Note  Subjective  Chief Complaint  Chief Complaint  Patient presents with  . Follow-up  . Diabetes    HPI  DMII: since last visit 03/2020 , she is on Soliqua , currently taking 54 units per day, max of 60 units per day. No nausea, no vomiting, no hypoglycemic episodes. She states no appetite since started medication. Fasting glucose was 201 in May and today down to 137 , gluocse in the pm down from 210 to 102-171 . She has been compliant with medication and doing well with dietary modifications, drinking Glucerna, also eating cottage cheese, avoiding sweets  SOB: seeing Dr. Nehemiah Massed , last visit 05/01/2020 , she went for shortness of breast on exertion she will have an echo and also EKG stress test, also waiting for sleep study, she was started on lasix and she states lower extremity edema has improved but no change in sob  . Seen by  Dr Raul Del, Spirometry was within normal limits.   Patient Active Problem List   Diagnosis Date Noted  . Bilateral leg edema 05/01/2020  . SOBOE (shortness of breath on exertion) 05/01/2020  . Colon cancer, ascending ([place]) 05/25/2019  . Mild episode of recurrent major depressive disorder ([place]) 08/01/2018  . SBO (small bowel obstruction) and ileocolonic anastomosis from recent colectomy 05/22/2018  . Cancer of ascending colon - pT1N0 (0/21) s/p lap right colectomy 05/11/2018 05/21/2018  . Hypomagnesemia 05/21/2018  . Ileus following gastrointestinal surgery ([place]) 05/20/2018  . Essential hemorrhagic thrombocythemia ([place]) 03/29/2018  . Hypercalcemia 11/23/2015  . DDD (degenerative disc disease), lumbar 07/28/2015  . Spinal stenosis at L4-L5 level 06/12/2015  . Benign essential HTN 05/25/2015  . Bunion 05/25/2015  . Cardiac enlargement 05/25/2015  . Cervical radicular pain 05/25/2015  . Back pain, chronic 05/25/2015  . Osteoarthritis 05/25/2015  . Dyslipidemia  05/25/2015  . Gastric reflux 05/25/2015  . Insomnia 05/25/2015  . Eczema intertrigo 05/25/2015  . Bronchitis, chronic, mucopurulent ([place]) 05/25/2015  . Numerous moles 05/25/2015  . Morbid obesity ([place]) 05/25/2015  . Perennial allergic rhinitis 05/25/2015  . DM (diabetes mellitus), type 2, uncontrolled, with renal complications ([place]) 16/05/3709  . Snores 05/25/2015  . History of artificial joint 05/25/2015  . Degeneration of intervertebral disc of cervical region 02/06/2015  . Neuritis or radiculitis due to rupture of lumbar intervertebral disc 01/14/2015  . Abnormal presence of protein in urine 05/04/2010  . Vitamin D deficiency 05/04/2010    Past Surgical History:  Procedure Laterality Date  . ABDOMINAL HYSTERECTOMY     due to cancer-partial  . BREAST EXCISIONAL BIOPSY Right yrs ago   Benign  . COLONOSCOPY N/A 04/11/2015   Procedure: COLONOSCOPY;  Surgeon: Hulen Luster, MD;  Location: [facility] ENDOSCOPY;  Service: Gastroenterology;  Laterality: N/A;  . COLONOSCOPY WITH PROPOFOL N/A 03/13/2018   Procedure: COLONOSCOPY WITH PROPOFOL;  Surgeon: [name], MD;  Location: [facility] ENDOSCOPY;  Service: Gastroenterology;  Laterality: N/A;  . COLONOSCOPY WITH PROPOFOL N/A 12/25/2018   Procedure: COLONOSCOPY WITH PROPOFOL;  Surgeon: [name], MD;  Location: [facility] ENDOSCOPY;  Service: Gastroenterology;  Laterality: N/A;  . JOINT REPLACEMENT     left knee x3,  right knee 2005  . LAPAROSCOPIC RIGHT COLECTOMY Right 05/11/2018   Procedure: LAPAROSCOPIC RIGHT HEMICOLECTOMY ERAS PATHWAY;  Surgeon: Ileana Roup, MD;  Location: [facility] ORS;  Service: General;  Laterality: Right;  . SHOULDER ARTHROSCOPY WITH  OPEN ROTATOR CUFF REPAIR Left 05/18/2016   Procedure: SHOULDER ARTHROSCOPY WITH OPEN ROTATOR CUFF REPAIR,distal clavicle excision, decompression;  Surgeon: Corky Mull, MD;  Location: [facility] ORS;  Service: Orthopedics;  Laterality: Left;  . SPINAL FUSION  1994   C-Spine  . Transforaminal Epidural   02/20/2015   Injection into cervical spine- C5-6 Dr. Phyllis Ginger    Family History  Problem Relation Age of Onset  . Diabetes Mother   . Kidney disease Mother   . Hypertension Mother   . Healthy Father   . Asthma Daughter   . Diabetes Sister   . Kidney disease Sister   . Pancreatic cancer Sister   . Diabetes Brother     Social History   Tobacco Use  . Smoking status: Never Smoker  . Smokeless tobacco: Never Used  . Tobacco comment: smoking cessation materials not required  Substance Use Topics  . Alcohol use: Not Currently    Alcohol/week: 0.0 standard drinks     Current Outpatient Medications:  .  acetaminophen (TYLENOL) 500 MG tablet, Take 1,000 mg by mouth every 6 (six) hours as needed for moderate pain or headache. , Disp: , Rfl:  .  Ascorbic Acid (VITAMIN C) 1000 MG tablet, Take 1,000 mg by mouth daily., Disp: , Rfl:  .  aspirin EC 81 MG tablet, Take 1 tablet (81 mg total) by mouth daily., Disp: 90 tablet, Rfl: 1 .  atorvastatin (LIPITOR) 80 MG tablet, Take 1 tablet by mouth once daily, Disp: 90 tablet, Rfl: 0 .  Carboxymethylcellul-Glycerin (LUBRICATING EYE DROPS OP), Place 2 drops into both eyes 2 (two) times daily as needed (for dry eyes)., Disp: , Rfl:  .  cetirizine (ZYRTEC) 10 MG tablet, Take 10 mg by mouth every other day as needed for allergies. , Disp: , Rfl:  .  Cholecalciferol (VITAMIN D-3) 1000 units CAPS, Take 1,000 Units by mouth daily. , Disp: , Rfl:  .  famotidine (PEPCID) 20 MG tablet, TAKE 1 TABLET BY MOUTH TWICE DAILY IN  PLACE  OF  RANITIDINE, Disp: 180 tablet, Rfl: 1 .  fluticasone (FLONASE) 50 MCG/ACT nasal spray, USE TWO SPRAY IN EACH NOSTRIL ONCE DAILY (Patient taking differently: every other day. ), Disp: 16 g, Rfl: 0 .  furosemide (LASIX) 20 MG tablet, Take 20 mg by mouth daily., Disp: , Rfl:  .  Glycopyrrolate-Formoterol (BEVESPI AEROSPHERE) 9-4.8 MCG/ACT AERO, Inhale 2 puffs into the lungs at bedtime., Disp: 10.7 g, Rfl: 2 .  Insulin  Glargine-Lixisenatide (SOLIQUA) 100-33 UNT-MCG/ML SOPN, Inject 10-50 units of lipase into the skin daily. (Patient taking differently: Inject 52 units of lipase into the skin daily. ), Disp: 15 mL, Rfl: 2 .  Insulin Pen Needle 31G X 6 MM MISC, 1 each by Does not apply route daily., Disp: 100 each, Rfl: 1 .  Lancets (ONETOUCH DELICA PLUS [id number]) MISC, CHECK GLUCOSE THREE TIMES DAILY, Disp: , Rfl:  .  lisinopril (ZESTRIL) 40 MG tablet, TAKE 1 TABLET BY MOUTH AT BEDTIME, Disp: 90 tablet, Rfl: 0 .  meclizine (ANTIVERT) 25 MG tablet, Take 1 tablet (25 mg total) by mouth every 6 (six) hours. (Patient taking differently: Take 25 mg by mouth daily as needed. ), Disp: 20 tablet, Rfl: 0 .  metFORMIN (GLUCOPHAGE-XR) 750 MG 24 hr tablet, TAKE 2 TABLETS BY MOUTH ONCE DAILY WITH BREAKFAST, Disp: 180 tablet, Rfl: 0 .  methocarbamol (ROBAXIN) 750 MG tablet, Take by mouth., Disp: , Rfl:  .  Multiple Vitamins-Minerals (MULTIVITAMIN PO), Take 1 tablet by  mouth daily., Disp: , Rfl:  .  Omega-3 1000 MG CAPS, Take by mouth., Disp: , Rfl:  .  pregabalin (LYRICA) 75 MG capsule, Take 1 capsule (75 mg total) by mouth 2 (two) times daily., Disp: 180 capsule, Rfl: 1 .  PROAIR HFA 108 (90 Base) MCG/ACT inhaler, INHALE 2 PUFFS INTO LUNGS EVERY 6 HOURS AS NEEDED FOR WHEEZING OR SHORTNESS OF BREATH, Disp: 9 g, Rfl: 2 .  traMADol (ULTRAM) 50 MG tablet, Take 1 tablet by mouth 2 (two) times daily as needed. , Disp: , Rfl: 3 .  traZODone (DESYREL) 50 MG tablet, TAKE ONE-HALF TO ONE TABLET BY MOUTH AT BEDTIME AS NEEDED FOR SLEEP, Disp: 90 tablet, Rfl: 0  No Known Allergies  I personally reviewed active problem list, medication list, allergies, family history, social history, health maintenance with the patient/caregiver today.   ROS  Constitutional: Negative for fever or weight change.  Respiratory: Negative for cough and shortness of breath.   Cardiovascular: Negative for chest pain or palpitations.  Gastrointestinal:  Negative for abdominal pain, no bowel changes.  Musculoskeletal: Negative for gait problem or joint swelling.  Skin: Negative for rash.  Neurological: Negative for dizziness or headache.  No other specific complaints in a complete review of systems (except as listed in HPI above).  Objective  Vitals:   05/29/20 0858  BP: 128/72  Pulse: 90  Resp: 18  Temp: (!) 97.2 F (36.2 C)  TempSrc: Temporal  SpO2: 98%  Weight: 240 lb 8 oz (109.1 kg)  Height: 5\' 4"  (1.626 m)    Body mass index is 41.28 kg/m.  Physical Exam  Constitutional: Patient appears well-developed and well-nourished. Obese No distress.  HEENT: head atraumatic, normocephalic, pupils equal and reactive to light,  neck supple Cardiovascular: Normal rate, regular rhythm and normal heart sounds.  No murmur heard. Trace BLE edema. Pulmonary/Chest: Effort normal and breath sounds normal. No respiratory distress. Abdominal: Soft.  There is no tenderness. Psychiatric: Patient has a normal mood and affect. behavior is normal. Judgment and thought content normal.   Recent Results (from the past 2160 hour(s))  POCT HgB A1C     Status: Abnormal   Collection Time: 03/12/20  1:20 PM  Result Value Ref Range   Hemoglobin A1C 9.8 (A) 4.0 - 5.6 %   HbA1c POC (<> result, manual entry)     HbA1c, POC (prediabetic range)     HbA1c, POC (controlled diabetic range)    CEA     Status: Abnormal   Collection Time: 03/20/20 11:26 AM  Result Value Ref Range   CEA 7.5 (H) 0.0 - 4.7 ng/mL    Comment: (NOTE)                             Nonsmokers          <3.9                             Smokers             <5.6 Roche [employer] Electrochemiluminescence Immunoassay (ECLIA) Values obtained with different assay methods or kits cannot be used interchangeably.  Results cannot be interpreted as absolute evidence of the presence or absence of malignant disease. Performed At: [facility] [place], [place]  [id number] Rush Farmer MD LZ:[id number]     PHQ2/9: Depression screen [facility] 2/9 05/29/2020 04/08/2020 03/12/2020 11/12/2019 11/12/2019  Decreased  Interest 0 0 0 1 0  Down, Depressed, Hopeless 0 0 0 1 0  PHQ - 2 Score 0 0 0 2 0  Altered sleeping 0 0 0 1 0  Tired, decreased energy 0 0 0 1 0  Change in appetite 0 0 0 0 0  Feeling bad or failure about yourself  0 0 0 0 0  Trouble concentrating 0 0 0 0 0  Moving slowly or fidgety/restless 0 0 0 0 0  Suicidal thoughts 0 0 0 0 0  PHQ-9 Score 0 0 0 4 0  Difficult doing work/chores - - - Not difficult at all -  Some recent data might be hidden    phq 9 is negative   Fall Risk: Fall Risk  05/29/2020 04/08/2020 03/12/2020 11/12/2019 11/12/2019  Falls in the past year? 0 0 0 0 0  Number falls in past yr: 0 0 0 0 0  Injury with Fall? 0 0 0 0 0  Comment - - - - -  Risk for fall due to : - - - - -  Risk for fall due to: Comment - - - - -  Follow up - - - - -     Assessment & Plan  1. DM (diabetes mellitus), type 2, uncontrolled, with renal complications ([place])  Continue Soliqua , may go up to 60 units per day   2. Lower extremity edema  Doing better on lasix

## 2020-05-30 ENCOUNTER — Telehealth: Payer: Self-pay | Admitting: Family Medicine

## 2020-05-30 DIAGNOSIS — J411 Mucopurulent chronic bronchitis: Secondary | ICD-10-CM

## 2020-05-30 NOTE — Telephone Encounter (Signed)
Requested Prescriptions  Pending Prescriptions Disp Refills   PROAIR HFA 108 (90 Base) MCG/ACT inhaler [Pharmacy Med Name: ProAir HFA 108 (90 Base) MCG/ACT Inhalation Aerosol Solution] 9 g 0    Sig: INHALE 2 PUFFS INTO LUNGS EVERY 6 HOURS AS NEEDED FOR WHEEZING FOR SHORTNESS OF BREATH     Pulmonology:  Beta Agonists Failed - 05/30/2020 11:12 AM      Failed - One inhaler should last at least one month. If the patient is requesting refills earlier, contact the patient to check for uncontrolled symptoms.      Passed - Valid encounter within last 12 months    Recent Outpatient Visits          Yesterday DM (diabetes mellitus), type 2, uncontrolled, with renal complications [facility])   [facility] [place], Drue Stager, MD   1 month ago DM (diabetes mellitus), type 2, uncontrolled, with renal complications [facility])   [facility] [place], Drue Stager, MD   2 months ago DM (diabetes mellitus), type 2, uncontrolled, with renal complications [facility])   [facility] [place], Drue Stager, MD   6 months ago DM (diabetes mellitus), type 2, uncontrolled, with renal complications [facility])   [facility] [place], Drue Stager, MD   11 months ago DM (diabetes mellitus), type 2, uncontrolled, with renal complications [facility])   [facility] Steele Sizer, MD      Future Appointments            In 3 months Ancil Boozer, Drue Stager, MD [facility], [facility]

## 2020-05-30 NOTE — Telephone Encounter (Signed)
Robin calling from [place] to let Dr. Ancil Boozer know that insurance will not Proair. Requesting a script change for Ventolin.  Insurance will pay for Ventolin. Resubmitted script for Ventolin. Or call for verbal. Cb- [place]

## 2020-06-03 ENCOUNTER — Other Ambulatory Visit: Payer: Self-pay | Admitting: Family Medicine

## 2020-06-03 MED ORDER — ALBUTEROL SULFATE HFA 108 (90 BASE) MCG/ACT IN AERS
2.0000 | INHALATION_SPRAY | Freq: Four times a day (QID) | RESPIRATORY_TRACT | 0 refills | Status: DC | PRN
Start: 2020-06-03 — End: 2020-07-28

## 2020-06-11 ENCOUNTER — Other Ambulatory Visit: Payer: Self-pay | Admitting: Family Medicine

## 2020-06-11 DIAGNOSIS — K219 Gastro-esophageal reflux disease without esophagitis: Secondary | ICD-10-CM

## 2020-06-16 DIAGNOSIS — R0602 Shortness of breath: Secondary | ICD-10-CM | POA: Diagnosis not present

## 2020-06-23 ENCOUNTER — Other Ambulatory Visit: Payer: Medicare HMO

## 2020-06-23 ENCOUNTER — Other Ambulatory Visit: Payer: Self-pay | Admitting: Family Medicine

## 2020-06-23 ENCOUNTER — Telehealth: Payer: Self-pay

## 2020-06-23 ENCOUNTER — Other Ambulatory Visit: Payer: Self-pay | Admitting: *Deleted

## 2020-06-23 ENCOUNTER — Inpatient Hospital Stay: Payer: Medicare HMO | Attending: Internal Medicine

## 2020-06-23 ENCOUNTER — Other Ambulatory Visit: Payer: Self-pay

## 2020-06-23 DIAGNOSIS — R600 Localized edema: Secondary | ICD-10-CM | POA: Diagnosis not present

## 2020-06-23 DIAGNOSIS — Z800 Family history of malignant neoplasm of digestive organs: Secondary | ICD-10-CM | POA: Diagnosis not present

## 2020-06-23 DIAGNOSIS — IMO0002 Reserved for concepts with insufficient information to code with codable children: Secondary | ICD-10-CM

## 2020-06-23 DIAGNOSIS — E782 Mixed hyperlipidemia: Secondary | ICD-10-CM | POA: Diagnosis not present

## 2020-06-23 DIAGNOSIS — C182 Malignant neoplasm of ascending colon: Secondary | ICD-10-CM

## 2020-06-23 DIAGNOSIS — E1129 Type 2 diabetes mellitus with other diabetic kidney complication: Secondary | ICD-10-CM

## 2020-06-23 DIAGNOSIS — R970 Elevated carcinoembryonic antigen [CEA]: Secondary | ICD-10-CM | POA: Insufficient documentation

## 2020-06-23 DIAGNOSIS — I10 Essential (primary) hypertension: Secondary | ICD-10-CM | POA: Diagnosis not present

## 2020-06-23 DIAGNOSIS — Z90710 Acquired absence of both cervix and uterus: Secondary | ICD-10-CM | POA: Diagnosis not present

## 2020-06-23 DIAGNOSIS — Z85118 Personal history of other malignant neoplasm of bronchus and lung: Secondary | ICD-10-CM | POA: Diagnosis not present

## 2020-06-23 DIAGNOSIS — R0789 Other chest pain: Secondary | ICD-10-CM | POA: Diagnosis not present

## 2020-06-23 LAB — COMPREHENSIVE METABOLIC PANEL
ALT: 18 U/L (ref 0–44)
AST: 22 U/L (ref 15–41)
Albumin: 4 g/dL (ref 3.5–5.0)
Alkaline Phosphatase: 76 U/L (ref 38–126)
Anion gap: 9 (ref 5–15)
BUN: 17 mg/dL (ref 8–23)
CO2: 26 mmol/L (ref 22–32)
Calcium: 9.1 mg/dL (ref 8.9–10.3)
Chloride: 105 mmol/L (ref 98–111)
Creatinine, Ser: 0.98 mg/dL (ref 0.44–1.00)
GFR calc Af Amer: >60 mL/min (ref >60)
GFR calc non Af Amer: 57 mL/min — ABNORMAL LOW (ref >60)
Glucose, Bld: 212 mg/dL — ABNORMAL HIGH (ref 70–99)
Potassium: 3.9 mmol/L (ref 3.5–5.1)
Sodium: 140 mmol/L (ref 135–145)
Total Bilirubin: 0.5 mg/dL (ref 0.3–1.2)
Total Protein: 7.4 g/dL (ref 6.5–8.1)

## 2020-06-23 LAB — CBC WITH DIFFERENTIAL/PLATELET
Abs Immature Granulocytes: 0.02 10*3/uL (ref 0.00–0.07)
Basophils Absolute: 0.1 10*3/uL (ref 0.0–0.1)
Basophils Relative: 1 %
Eosinophils Absolute: 0.2 10*3/uL (ref 0.0–0.5)
Eosinophils Relative: 3 %
HCT: 36 % (ref 36.0–46.0)
Hemoglobin: 11.8 g/dL — ABNORMAL LOW (ref 12.0–15.0)
Immature Granulocytes: 0 %
Lymphocytes Relative: 29 %
Lymphs Abs: 2.1 10*3/uL (ref 0.7–4.0)
MCH: 28 pg (ref 26.0–34.0)
MCHC: 32.8 g/dL (ref 30.0–36.0)
MCV: 85.3 fL (ref 80.0–100.0)
Monocytes Absolute: 0.5 10*3/uL (ref 0.1–1.0)
Monocytes Relative: 7 %
Neutro Abs: 4.4 10*3/uL (ref 1.7–7.7)
Neutrophils Relative %: 60 %
Platelets: 282 10*3/uL (ref 150–400)
RBC: 4.22 MIL/uL (ref 3.87–5.11)
RDW: 14.6 % (ref 11.5–15.5)
WBC: 7.3 10*3/uL (ref 4.0–10.5)
nRBC: 0 % (ref 0.0–0.2)

## 2020-06-23 NOTE — Telephone Encounter (Signed)
Copied from [place] [phone]. Topic: General - Call Back - No Documentation >> Jun 23, 2020  3:45 PM Erick Blinks wrote: Pt would like a call back regarding her medication "I cannot afford to pay $101, I need something more reasonable, or samples" please advise Best contact: [phone]   Called patient about not Willeen Niece she is not able to afford the $101 she needs an alternative that is more affordable.

## 2020-06-24 LAB — CEA: CEA: 5.8 ng/mL — ABNORMAL HIGH (ref 0.0–4.7)

## 2020-06-25 ENCOUNTER — Ambulatory Visit: Payer: Medicare HMO | Admitting: Internal Medicine

## 2020-06-25 ENCOUNTER — Encounter: Payer: Self-pay | Admitting: Internal Medicine

## 2020-06-25 ENCOUNTER — Other Ambulatory Visit: Payer: Self-pay

## 2020-06-25 ENCOUNTER — Inpatient Hospital Stay (HOSPITAL_BASED_OUTPATIENT_CLINIC_OR_DEPARTMENT_OTHER): Payer: Medicare HMO | Admitting: Internal Medicine

## 2020-06-25 DIAGNOSIS — C182 Malignant neoplasm of ascending colon: Secondary | ICD-10-CM | POA: Diagnosis not present

## 2020-06-25 DIAGNOSIS — Z800 Family history of malignant neoplasm of digestive organs: Secondary | ICD-10-CM | POA: Diagnosis not present

## 2020-06-25 DIAGNOSIS — Z85118 Personal history of other malignant neoplasm of bronchus and lung: Secondary | ICD-10-CM | POA: Diagnosis not present

## 2020-06-25 DIAGNOSIS — Z90710 Acquired absence of both cervix and uterus: Secondary | ICD-10-CM | POA: Diagnosis not present

## 2020-06-25 DIAGNOSIS — R970 Elevated carcinoembryonic antigen [CEA]: Secondary | ICD-10-CM | POA: Diagnosis not present
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
## 2020-06-25 NOTE — Telephone Encounter (Signed)
She has not been able to afford Victoza in the past. There has to be another alternative to her getting affordable insulin medication even if she has to go back on tablets. I told patient that I will check to see if there is a coupon and call her back.

## 2020-06-25 NOTE — Assessment & Plan Note (Addendum)
#  Colon cancer-of ascending colon- stage I; MSI mismatch/sporadic- Excellent prognosis more than 90% chance of cure. January 2021 pelvis CT scan -no evidence of recurrence.  Continue colonoscopies as per GI [? 2023 Jan].  Clinically stable.  #Abnormal CEA-around 5-7; overall stable.  Low clinical suspicion of any recurrent malignancy.  Monitor for now.  Hold off imaging at this time.   # DISPOSITION:  # follow up in 6 months-MD; labs- CEA 2 days prior

## 2020-06-26 ENCOUNTER — Telehealth: Payer: Self-pay | Admitting: Pharmacist

## 2020-06-27 ENCOUNTER — Telehealth: Payer: Self-pay | Admitting: Family Medicine

## 2020-06-27 NOTE — Telephone Encounter (Signed)
Copied from [place] [id number]. Topic: Medicare AWV >> Jun 27, 2020  9:56 AM Cher Nakai R wrote: Reason for CRM:   Left message for patient to call back and schedule the Medicare Annual Wellness Visit (AWV) in office or virtual  Last AWV 07/03/2019  Please schedule at anytime with [facility].  40 minute appointment  Any questions, please contact me at [phone]

## 2020-07-10 DIAGNOSIS — Z20828 Contact with and (suspected) exposure to other viral communicable diseases: Secondary | ICD-10-CM | POA: Diagnosis not present

## 2020-07-15 ENCOUNTER — Other Ambulatory Visit: Payer: Self-pay

## 2020-07-15 ENCOUNTER — Ambulatory Visit (INDEPENDENT_AMBULATORY_CARE_PROVIDER_SITE_OTHER): Payer: Medicare HMO

## 2020-07-15 VITALS — BP 110/70 | HR 92 | Temp 98.4°F | Resp 16 | Ht 64.0 in | Wt 235.4 lb

## 2020-07-15 DIAGNOSIS — Z0000 Encounter for general adult medical examination without abnormal findings: Secondary | ICD-10-CM | POA: Diagnosis not present

## 2020-07-15 DIAGNOSIS — Z1231 Encounter for screening mammogram for malignant neoplasm of breast: Secondary | ICD-10-CM
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
## 2020-07-15 NOTE — Patient Instructions (Signed)
Cindy Griffin , Thank you for taking time to come for your Medicare Wellness Visit. I appreciate your ongoing commitment to your health goals. Please review the following plan we discussed and let me know if I can assist you in the future.   Screening recommendations/referrals: Colonoscopy: done 12/25/18. Repeat in 2023.  Mammogram: done 09/11/19. Please call [phone] to schedule your mammogram.  Bone Density: done 09/11/19 Recommended yearly ophthalmology/optometry visit for glaucoma screening and checkup Recommended yearly dental visit for hygiene and checkup  Vaccinations: Influenza vaccine: done 11/12/19 Pneumococcal vaccine: done 08/29/15 Tdap vaccine: due Shingles vaccine: done 09/18/18 & 11/27/18   Covid-19:done 01/08/20 & 01/29/20  Conditions/risks identified: Recommend healthy eating and physical activity to lower A1c  Next appointment: Follow up in one year for your annual wellness visit    Preventive Care 38 Years and Older, Female Preventive care refers to lifestyle choices and visits with your health care provider that can promote health and wellness. What does preventive care include?  A yearly physical exam. This is also called an annual well check.  Dental exams once or twice a year.  Routine eye exams. Ask your health care provider how often you should have your eyes checked.  Personal lifestyle choices, including:  Daily care of your teeth and gums.  Regular physical activity.  Eating a healthy diet.  Avoiding tobacco and drug use.  Limiting alcohol use.  Practicing safe sex.  Taking low-dose aspirin every day.  Taking vitamin and mineral supplements as recommended by your health care provider. What happens during an annual well check? The services and screenings done by your health care provider during your annual well check will depend on your age, overall health, lifestyle risk factors, and family history of disease. Counseling  Your health care  provider may ask you questions about your:  Alcohol use.  Tobacco use.  Drug use.  Emotional well-being.  Home and relationship well-being.  Sexual activity.  Eating habits.  History of falls.  Memory and ability to understand (cognition).  Work and work Statistician.  Reproductive health. Screening  You may have the following tests or measurements:  Height, weight, and BMI.  Blood pressure.  Lipid and cholesterol levels. These may be checked every 5 years, or more frequently if you are over 32 years old.  Skin check.  Lung cancer screening. You may have this screening every year starting at age 64 if you have a 30-pack-year history of smoking and currently smoke or have quit within the past 15 years.  Fecal occult blood test (FOBT) of the stool. You may have this test every year starting at age 57.  Flexible sigmoidoscopy or colonoscopy. You may have a sigmoidoscopy every 5 years or a colonoscopy every 10 years starting at age 36.  Hepatitis C blood test.  Hepatitis B blood test.  Sexually transmitted disease (STD) testing.  Diabetes screening. This is done by checking your blood sugar (glucose) after you have not eaten for a while (fasting). You may have this done every 1-3 years.  Bone density scan. This is done to screen for osteoporosis. You may have this done starting at age 35.  Mammogram. This may be done every 1-2 years. Talk to your health care provider about how often you should have regular mammograms. Talk with your health care provider about your test results, treatment options, and if necessary, the need for more tests. Vaccines  Your health care provider may recommend certain vaccines, such as:  Influenza vaccine. This is  recommended every year.  Tetanus, diphtheria, and acellular pertussis (Tdap, Td) vaccine. You may need a Td booster every 10 years.  Zoster vaccine. You may need this after age 54.  Pneumococcal 13-valent conjugate (PCV13)  vaccine. One dose is recommended after age 87.  Pneumococcal polysaccharide (PPSV23) vaccine. One dose is recommended after age 61. Talk to your health care provider about which screenings and vaccines you need and how often you need them. This information is not intended to replace advice given to you by your health care provider. Make sure you discuss any questions you have with your health care provider. Document Released: 12/12/2015 Document Revised: 08/04/2016 Document Reviewed: 09/16/2015 Elsevier Interactive Patient Education  2017 [place] Prevention in the Home Falls can cause injuries. They can happen to people of all ages. There are many things you can do to make your home safe and to help prevent falls. What can I do on the outside of my home?  Regularly fix the edges of walkways and driveways and fix any cracks.  Remove anything that might make you trip as you walk through a door, such as a raised step or threshold.  Trim any bushes or trees on the path to your home.  Use bright outdoor lighting.  Clear any walking paths of anything that might make someone trip, such as rocks or tools.  Regularly check to see if handrails are loose or broken. Make sure that both sides of any steps have handrails.  Any raised decks and porches should have guardrails on the edges.  Have any leaves, snow, or ice cleared regularly.  Use sand or salt on walking paths during winter.  Clean up any spills in your garage right away. This includes oil or grease spills. What can I do in the bathroom?  Use night lights.  Install grab bars by the toilet and in the tub and shower. Do not use towel bars as grab bars.  Use non-skid mats or decals in the tub or shower.  If you need to sit down in the shower, use a plastic, non-slip stool.  Keep the floor dry. Clean up any water that spills on the floor as soon as it happens.  Remove soap buildup in the tub or shower  regularly.  Attach bath mats securely with double-sided non-slip rug tape.  Do not have throw rugs and other things on the floor that can make you trip. What can I do in the bedroom?  Use night lights.  Make sure that you have a light by your bed that is easy to reach.  Do not use any sheets or blankets that are too big for your bed. They should not hang down onto the floor.  Have a firm chair that has side arms. You can use this for support while you get dressed.  Do not have throw rugs and other things on the floor that can make you trip. What can I do in the kitchen?  Clean up any spills right away.  Avoid walking on wet floors.  Keep items that you use a lot in easy-to-reach places.  If you need to reach something above you, use a strong step stool that has a grab bar.  Keep electrical cords out of the way.  Do not use floor polish or wax that makes floors slippery. If you must use wax, use non-skid floor wax.  Do not have throw rugs and other things on the floor that can make you trip.  What can I do with my stairs?  Do not leave any items on the stairs.  Make sure that there are handrails on both sides of the stairs and use them. Fix handrails that are broken or loose. Make sure that handrails are as long as the stairways.  Check any carpeting to make sure that it is firmly attached to the stairs. Fix any carpet that is loose or worn.  Avoid having throw rugs at the top or bottom of the stairs. If you do have throw rugs, attach them to the floor with carpet tape.  Make sure that you have a light switch at the top of the stairs and the bottom of the stairs. If you do not have them, ask someone to add them for you. What else can I do to help prevent falls?  Wear shoes that:  Do not have high heels.  Have rubber bottoms.  Are comfortable and fit you well.  Are closed at the toe. Do not wear sandals.  If you use a stepladder:  Make sure that it is fully  opened. Do not climb a closed stepladder.  Make sure that both sides of the stepladder are locked into place.  Ask someone to hold it for you, if possible.  Clearly mark and make sure that you can see:  Any grab bars or handrails.  First and last steps.  Where the edge of each step is.  Use tools that help you move around (mobility aids) if they are needed. These include:  Canes.  Walkers.  Scooters.  Crutches.  Turn on the lights when you go into a dark area. Replace any light bulbs as soon as they burn out.  Set up your furniture so you have a clear path. Avoid moving your furniture around.  If any of your floors are uneven, fix them.  If there are any pets around you, be aware of where they are.  Review your medicines with your doctor. Some medicines can make you feel dizzy. This can increase your chance of falling. Ask your doctor what other things that you can do to help prevent falls. This information is not intended to replace advice given to you by your health care provider. Make sure you discuss any questions you have with your health care provider. Document Released: 09/11/2009 Document Revised: 04/22/2016 Document Reviewed: 12/20/2014 Elsevier Interactive Patient Education  2017 [employer].

## 2020-07-24 ENCOUNTER — Other Ambulatory Visit: Payer: Self-pay

## 2020-07-24 DIAGNOSIS — I10 Essential (primary) hypertension: Secondary | ICD-10-CM

## 2020-07-24 DIAGNOSIS — E785 Hyperlipidemia, unspecified: Secondary | ICD-10-CM

## 2020-07-24 DIAGNOSIS — E1142 Type 2 diabetes mellitus with diabetic polyneuropathy: Secondary | ICD-10-CM

## 2020-07-24 DIAGNOSIS — R42 Dizziness and giddiness: Secondary | ICD-10-CM

## 2020-07-24 DIAGNOSIS — K219 Gastro-esophageal reflux disease without esophagitis: Secondary | ICD-10-CM

## 2020-07-24 DIAGNOSIS — E1169 Type 2 diabetes mellitus with other specified complication: Secondary | ICD-10-CM

## 2020-07-24 DIAGNOSIS — E1121 Type 2 diabetes mellitus with diabetic nephropathy: Secondary | ICD-10-CM

## 2020-07-25 ENCOUNTER — Other Ambulatory Visit: Payer: Self-pay

## 2020-07-28 ENCOUNTER — Other Ambulatory Visit: Payer: Self-pay | Admitting: Family Medicine

## 2020-07-28 ENCOUNTER — Other Ambulatory Visit: Payer: Self-pay

## 2020-07-28 DIAGNOSIS — K219 Gastro-esophageal reflux disease without esophagitis: Secondary | ICD-10-CM

## 2020-07-28 DIAGNOSIS — E785 Hyperlipidemia, unspecified: Secondary | ICD-10-CM

## 2020-07-28 DIAGNOSIS — R42 Dizziness and giddiness: Secondary | ICD-10-CM

## 2020-07-28 DIAGNOSIS — E1169 Type 2 diabetes mellitus with other specified complication: Secondary | ICD-10-CM

## 2020-07-28 DIAGNOSIS — E1121 Type 2 diabetes mellitus with diabetic nephropathy: Secondary | ICD-10-CM

## 2020-07-28 DIAGNOSIS — I10 Essential (primary) hypertension: Secondary | ICD-10-CM

## 2020-07-28 DIAGNOSIS — J3089 Other allergic rhinitis: Secondary | ICD-10-CM

## 2020-07-28 DIAGNOSIS — E1142 Type 2 diabetes mellitus with diabetic polyneuropathy: Secondary | ICD-10-CM

## 2020-07-28 MED ORDER — BD PEN NEEDLE MICRO U/F 32G X 6 MM MISC
1.0000 | Freq: Every day | 2 refills | Status: DC
Start: 2020-07-28 — End: 2020-08-01

## 2020-07-28 MED ORDER — MECLIZINE HCL 25 MG PO TABS: 25.0000 mg | ORAL_TABLET | Freq: Four times a day (QID) | ORAL | 0 refills | Status: DC

## 2020-07-28 MED ORDER — METFORMIN HCL ER 750 MG PO TB24: ORAL_TABLET | ORAL | 0 refills | Status: DC

## 2020-07-28 MED ORDER — ATORVASTATIN CALCIUM 80 MG PO TABS: 80.0000 mg | ORAL_TABLET | Freq: Every day | ORAL | 1 refills | Status: DC

## 2020-07-28 MED ORDER — FUROSEMIDE 20 MG PO TABS
20.0000 mg | ORAL_TABLET | Freq: Every day | ORAL | 0 refills | Status: DC
Start: 2020-07-28 — End: 2020-08-01

## 2020-07-28 MED ORDER — GLIPIZIDE ER 10 MG PO TB24
10.0000 mg | ORAL_TABLET | Freq: Every day | ORAL | 0 refills | Status: DC
Start: 2020-07-28 — End: 2020-08-01

## 2020-07-28 MED ORDER — METHOCARBAMOL 750 MG PO TABS: 750.0000 mg | ORAL_TABLET | Freq: Three times a day (TID) | ORAL | 0 refills | Status: DC | PRN

## 2020-07-28 MED ORDER — LISINOPRIL 40 MG PO TABS: 40.0000 mg | ORAL_TABLET | Freq: Every day | ORAL | 0 refills | Status: DC

## 2020-07-28 MED ORDER — ALBUTEROL SULFATE HFA 108 (90 BASE) MCG/ACT IN AERS: 2.0000 | INHALATION_SPRAY | Freq: Four times a day (QID) | RESPIRATORY_TRACT | 0 refills | Status: DC | PRN

## 2020-07-28 MED ORDER — FLUTICASONE PROPIONATE 50 MCG/ACT NA SUSP: NASAL | 0 refills | Status: DC

## 2020-07-28 MED ORDER — TRAZODONE HCL 50 MG PO TABS
ORAL_TABLET | ORAL | 0 refills | Status: DC
Start: 2020-07-28 — End: 2020-10-09

## 2020-07-28 MED ORDER — FAMOTIDINE 20 MG PO TABS: ORAL_TABLET | ORAL | 0 refills | Status: DC

## 2020-08-01 ENCOUNTER — Other Ambulatory Visit: Payer: Self-pay

## 2020-08-01 DIAGNOSIS — E1142 Type 2 diabetes mellitus with diabetic polyneuropathy: Secondary | ICD-10-CM

## 2020-08-01 DIAGNOSIS — I10 Essential (primary) hypertension: Secondary | ICD-10-CM

## 2020-08-01 DIAGNOSIS — K219 Gastro-esophageal reflux disease without esophagitis: Secondary | ICD-10-CM

## 2020-08-01 DIAGNOSIS — R42 Dizziness and giddiness: Secondary | ICD-10-CM

## 2020-08-01 DIAGNOSIS — E1169 Type 2 diabetes mellitus with other specified complication: Secondary | ICD-10-CM

## 2020-08-01 DIAGNOSIS — E785 Hyperlipidemia, unspecified: Secondary | ICD-10-CM

## 2020-08-01 DIAGNOSIS — E1121 Type 2 diabetes mellitus with diabetic nephropathy: Secondary | ICD-10-CM

## 2020-08-01 DIAGNOSIS — J3089 Other allergic rhinitis: Secondary | ICD-10-CM

## 2020-08-01 NOTE — Telephone Encounter (Signed)
Jamie lvm asking which pharmacy the patient is using due our office receiving multiple request for medication refills.  On 07/28/20 several of patient refills were sent to [facility] and [facility].  Provider also needed to know if patient is wanting 90 day supply.

## 2020-08-01 NOTE — Telephone Encounter (Signed)
Patient called to verify that she is using [facility] now as her pharmacy.  She requested that the other pharmacy be removed from her profile, and it was.

## 2020-08-03 DIAGNOSIS — Z20828 Contact with and (suspected) exposure to other viral communicable diseases: Secondary | ICD-10-CM | POA: Diagnosis not present

## 2020-08-05 MED ORDER — METFORMIN HCL ER 750 MG PO TB24: ORAL_TABLET | ORAL | 1 refills | Status: DC

## 2020-08-05 MED ORDER — ATORVASTATIN CALCIUM 80 MG PO TABS: 80.0000 mg | ORAL_TABLET | Freq: Every day | ORAL | 1 refills | Status: DC

## 2020-08-05 MED ORDER — MECLIZINE HCL 25 MG PO TABS: 25.0000 mg | ORAL_TABLET | Freq: Four times a day (QID) | ORAL | 0 refills | Status: DC

## 2020-08-05 MED ORDER — GLIPIZIDE ER 10 MG PO TB24
10.0000 mg | ORAL_TABLET | Freq: Every day | ORAL | 0 refills | Status: DC
Start: 2020-08-05 — End: 2020-09-03

## 2020-08-05 MED ORDER — BD PEN NEEDLE MICRO U/F 32G X 6 MM MISC
1.0000 | Freq: Every day | 2 refills | Status: DC
Start: 2020-08-05 — End: 2021-07-15

## 2020-08-05 MED ORDER — FLUTICASONE PROPIONATE 50 MCG/ACT NA SUSP: NASAL | 0 refills | Status: DC

## 2020-08-05 MED ORDER — FUROSEMIDE 20 MG PO TABS
20.0000 mg | ORAL_TABLET | Freq: Every day | ORAL | 1 refills | Status: DC
Start: 2020-08-05 — End: 2020-10-09

## 2020-08-05 MED ORDER — METHOCARBAMOL 750 MG PO TABS: 750.0000 mg | ORAL_TABLET | Freq: Three times a day (TID) | ORAL | 1 refills | Status: DC | PRN

## 2020-08-05 MED ORDER — FAMOTIDINE 20 MG PO TABS: ORAL_TABLET | ORAL | 1 refills | Status: DC

## 2020-08-05 MED ORDER — LISINOPRIL 40 MG PO TABS: 40.0000 mg | ORAL_TABLET | Freq: Every day | ORAL | 1 refills | Status: DC

## 2020-08-05 MED ORDER — ALBUTEROL SULFATE HFA 108 (90 BASE) MCG/ACT IN AERS: 2.0000 | INHALATION_SPRAY | Freq: Four times a day (QID) | RESPIRATORY_TRACT | 0 refills | Status: DC | PRN

## 2020-08-07 ENCOUNTER — Other Ambulatory Visit: Payer: Self-pay

## 2020-08-07 DIAGNOSIS — J3089 Other allergic rhinitis: Secondary | ICD-10-CM

## 2020-08-08 MED ORDER — ALBUTEROL SULFATE HFA 108 (90 BASE) MCG/ACT IN AERS: 2.0000 | INHALATION_SPRAY | Freq: Four times a day (QID) | RESPIRATORY_TRACT | 0 refills | Status: DC | PRN

## 2020-08-08 MED ORDER — FLUTICASONE PROPIONATE 50 MCG/ACT NA SUSP: NASAL | 0 refills | Status: DC

## 2020-08-08 MED ORDER — PIOGLITAZONE HCL 15 MG PO TABS
15.0000 mg | ORAL_TABLET | Freq: Every day | ORAL | 0 refills | Status: DC
Start: 2020-08-08 — End: 2020-09-03

## 2020-08-11 DIAGNOSIS — M4802 Spinal stenosis, cervical region: Secondary | ICD-10-CM | POA: Diagnosis not present

## 2020-08-11 DIAGNOSIS — M5136 Other intervertebral disc degeneration, lumbar region: Secondary | ICD-10-CM | POA: Diagnosis not present

## 2020-08-11 DIAGNOSIS — M5412 Radiculopathy, cervical region: Secondary | ICD-10-CM | POA: Diagnosis not present

## 2020-08-11 DIAGNOSIS — M48062 Spinal stenosis, lumbar region with neurogenic claudication: Secondary | ICD-10-CM | POA: Diagnosis not present

## 2020-08-11 DIAGNOSIS — M5416 Radiculopathy, lumbar region: Secondary | ICD-10-CM | POA: Diagnosis not present

## 2020-08-11 DIAGNOSIS — M5030 Other cervical disc degeneration, unspecified cervical region: Secondary | ICD-10-CM | POA: Diagnosis not present

## 2020-08-16 ENCOUNTER — Telehealth: Payer: Self-pay | Admitting: Family Medicine

## 2020-08-16 NOTE — Telephone Encounter (Signed)
Requested  medications are  due for refill today yes  Requested medications are on the active medication list yes  Last refill 9/2  Notes to clinic Do not see this med/dx in an office note, not delegated.

## 2020-08-26 ENCOUNTER — Ambulatory Visit: Payer: Self-pay

## 2020-08-26 ENCOUNTER — Ambulatory Visit: Payer: Self-pay | Admitting: Pharmacist

## 2020-08-26 ENCOUNTER — Other Ambulatory Visit: Payer: Self-pay

## 2020-08-26 DIAGNOSIS — J411 Mucopurulent chronic bronchitis: Secondary | ICD-10-CM

## 2020-08-26 DIAGNOSIS — IMO0002 Reserved for concepts with insufficient information to code with codable children: Secondary | ICD-10-CM

## 2020-08-26 NOTE — Chronic Care Management (AMB) (Signed)
Chronic Care Management Pharmacy  Name: Cindy Griffin  MRN: [id number] DOB: 12-27-1945  Chief Complaint/ HPI   Cindy Griffin,  74 y.o. , female presents for their Follow-Up CCM visit with the clinical pharmacist via telephone due to COVID-19 Pandemic.  PCP : Steele Sizer, MD  Their chronic conditions include: DM, HTN, HLD, Bronchitis  Office Visits:NA  Consult Visit: 9/13 DDD, Chas[place], BP 139/79 P 71 Wt 238.5 BMI 40.9, lower back, neck pain, cervical fusion 1994, Lyrica/Cymbalta hallucinations, Flexeril not tolerated, tramadol moderate relief  Medications: Outpatient Encounter Medications as of 08/26/2020  Medication Sig  . acetaminophen (TYLENOL) 500 MG tablet Take 1,000 mg by mouth every 6 (six) hours as needed for moderate pain or headache.   . albuterol (VENTOLIN HFA) 108 (90 Base) MCG/ACT inhaler Inhale 2 puffs into the lungs every 6 (six) hours as needed for wheezing or shortness of breath.  . Ascorbic Acid (VITAMIN C) 1000 MG tablet Take 1,000 mg by mouth daily.  [name] aspirin EC 81 MG tablet Take 1 tablet (81 mg total) by mouth daily.  [name] atorvastatin (LIPITOR) 80 MG tablet Take 1 tablet (80 mg total) by mouth daily.  . BD PEN NEEDLE MICRO U/F 32G X 6 MM MISC 1 each by Other route daily.  . Carboxymethylcellul-Glycerin (LUBRICATING EYE DROPS OP) Place 2 drops into both eyes 2 (two) times daily as needed (for dry eyes).  . cetirizine (ZYRTEC) 10 MG tablet Take 10 mg by mouth every other day as needed for allergies.   . Cholecalciferol (VITAMIN D-3) 1000 units CAPS Take 1,000 Units by mouth daily.   . famotidine (PEPCID) 20 MG tablet TAKE 1 TABLET BY MOUTH TWICE DAILY IN PLACE OF RANITIDINE  . fluticasone (FLONASE) 50 MCG/ACT nasal spray USE TWO SPRAY IN EACH NOSTRIL ONCE DAILY  . furosemide (LASIX) 20 MG tablet Take 1 tablet (20 mg total) by mouth daily.  [name] glipiZIDE (GLUCOTROL XL) 10 MG 24 hr tablet Take 1 tablet (10 mg total) by mouth daily with breakfast.  .  Glycopyrrolate-Formoterol (BEVESPI AEROSPHERE) 9-4.8 MCG/ACT AERO Inhale 2 puffs into the lungs at bedtime.  . Lancets (ONETOUCH DELICA PLUS [id number]) MISC CHECK GLUCOSE THREE TIMES DAILY  . lisinopril (ZESTRIL) 40 MG tablet Take 1 tablet (40 mg total) by mouth at bedtime.  . meclizine (ANTIVERT) 25 MG tablet Take 1 tablet (25 mg total) by mouth every 6 (six) hours.  . metFORMIN (GLUCOPHAGE-XR) 750 MG 24 hr tablet TAKE 2 TABLETS BY MOUTH ONCE DAILY WITH BREAKFAST  . methocarbamol (ROBAXIN) 750 MG tablet Take 1 tablet (750 mg total) by mouth every 8 (eight) hours as needed for muscle spasms.  . Multiple Vitamins-Minerals (MULTIVITAMIN PO) Take 1 tablet by mouth daily.  . Omega-3 1000 MG CAPS Take by mouth.  . pioglitazone (ACTOS) 15 MG tablet Take 1 tablet (15 mg total) by mouth daily.  . pregabalin (LYRICA) 75 MG capsule Take 1 capsule (75 mg total) by mouth 2 (two) times daily.  Willeen Niece 100-33 UNT-MCG/ML SOPN INJECT 10 TO 50 UNITS SUBCUTANEOUSLY DAILY (Patient not taking: Reported on 07/15/2020)  . tiZANidine (ZANAFLEX) 2 MG tablet 1 po tid prn  . traMADol (ULTRAM) 50 MG tablet Take 1 tablet by mouth 2 (two) times daily as needed.   . traZODone (DESYREL) 50 MG tablet TAKE ONE-HALF TO ONE TABLET BY MOUTH AT BEDTIME AS NEEDED FOR SLEEP   No facility-administered encounter medications on file as of 08/26/2020.     Financial Resource Strain: High  Risk  . Difficulty of Paying Living Expenses: Very hard     Current Diagnosis/Assessment:  Goals Addressed   None     Diabetes   Recent Relevant Labs: Lab Results  Component Value Date/Time   HGBA1C 9.8 (A) 03/12/2020 01:20 PM   HGBA1C 8.4 (A) 11/12/2019 02:49 PM   HGBA1C 9.3 (A) 07/03/2019 09:19 AM   HGBA1C 6.9 03/01/2019 10:05 AM   MICROALBUR 0.6 11/12/2019 12:00 AM   MICROALBUR 1.4 10/31/2018 12:06 PM   MICROALBUR 50 07/22/2017 09:00 AM   MICROALBUR 50 07/15/2016 10:52 AM     Checking BG: 2x per Day  Recent pre-meal BG  readings: 136, 106, 236 (evening), 205, 192 pm, 101, 200 Patient has failed these meds in past: [place] (cost) Patient is currently uncontrolled on the following medications: glipizide XL 10mg  daily, metformin XR 1500mg  qam, Actos 15mg  daily  Last diabetic Foot exam:  Lab Results  Component Value Date/Time   HMDIABEYEEXA No Retinopathy 10/04/2019 12:00 AM    Last diabetic Eye exam: No results found for: HMDIABFOOTEX   We discussed:  Increased Lyrica to 75mg  bid? Yes, effective Taking glipizide XL 10mg  daily Metformin XR 1500mg  qam Actos 15mg  daily Likes potato bread (whole wheat?) 4.4R sugar/slice 154 calories  Plan   Continue current medications   Chronic Bronchitis    Eosinophil count:   Lab Results  Component Value Date/Time   EOSPCT 3 06/23/2020 01:26 PM  %                               Eos (Absolute):  Lab Results  Component Value Date/Time   EOSABS 0.2 06/23/2020 01:26 PM   EOSABS 0.2 11/21/2015 11:25 AM    Tobacco Status:  Social History   Tobacco Use  Smoking Status Never Smoker  Smokeless Tobacco Never Used  Tobacco Comment   smoking cessation materials not required    Patient has failed these meds in past: [name] Patient is currently uncontrolled on the following medications: ProAir Using maintenance inhaler regularly? No Frequency of rescue inhaler use:  multiple times per day  We discussed:   Bevespi? No, check formulary ProAir 2 - 3 times daily  Plan  Continue current medications  Medication Management   Pt uses [facility] for all medications Uses pill box? Yes Pt endorses 100% compliance  We discussed:   Partial LIS Donut hole? Yes Calcium supplement? No Tramadol or Lyrica Getting massages  Plan  Trelegy patient assistance Continue current medication management strategy  Follow up: 4 month phone visit  Milus Height, PharmD, [place], [facility] [phone]

## 2020-08-27 NOTE — Addendum Note (Signed)
Addended by: Steele Sizer F on: 08/27/2020 12:58 PM   Modules accepted: Orders

## 2020-08-27 NOTE — Telephone Encounter (Signed)
Pt wants to stay on the [place]

## 2020-08-28 NOTE — Patient Instructions (Addendum)
Visit Information  Goals Addressed            This Visit's Progress   . Chronic Care Management       CARE PLAN ENTRY (see longitudinal plan of care for additional care plan information)  Current Barriers:  . Chronic Disease Management support, education, and care coordination needs related to Hyperlipidemia, Diabetes, and COPD   Hypertension BP Readings from Last 3 Encounters:  04/08/20 (!) 142/70  03/12/20 116/60  12/21/19 129/77   . Pharmacist Clinical Goal(s): o Over the next 90 days, patient will work with PharmD and providers to maintain BP goal <140/90 . Current regimen:  o Lisinopril 40mg  daily . Interventions: o None . Patient self care activities - Over the next 90 days, patient will: o Check BP weekly, document, and provide at future appointments o Ensure daily salt intake < 2300 mg/day  Hyperlipidemia Lab Results  Component Value Date/Time   LDLCALC 43 11/12/2019 12:00 AM   . Pharmacist Clinical Goal(s): o Over the next 90 days, patient will work with PharmD and providers to maintain LDL goal < 70 . Current regimen:  o Lipitor 80mg  daily . Interventions: o None . Patient self care activities - Over the next 90 days, patient will: o Continue to take atorvastatin 80mg  daily o Report any unexplained muscle pain to PharmD or provider  Diabetes Lab Results  Component Value Date/Time   HGBA1C 9.8 (A) 03/12/2020 01:20 PM   HGBA1C 8.4 (A) 11/12/2019 02:49 PM   HGBA1C 9.3 (A) 07/03/2019 09:19 AM   HGBA1C 6.9 03/01/2019 10:05 AM   . Pharmacist Clinical Goal(s): o Over the next 90 days, patient will work with PharmD and providers to achieve A1c goal <7% . Current regimen:  o Glipizide XL 10mg  daily o Actos 15mg  daily o Metformin XR 1500mg  daily . Interventions: o None . Patient self care activities - Over the next 90 days, patient will: o Check blood sugar twice daily, document, and provide at future appointments o Contact provider with any episodes of  hypoglycemia o Contact PharmD regarding affordability  COPD . Pharmacist Clinical Goal(s) o Over the next 90 days, patient will work with PharmD and providers to reduce dependence on rescue inhaler . Current regimen:  o Bevespi not used due to cost o ProAir 2 puffs four times daily as needed for shortness of breath . Interventions: o Patient assistance for Trelegy . Patient self care activities - Over the next 90 days, patient will: o Attempt to reduce dependence on rescue inhaler o If shortness of breath worsens, on less ProAir, contact PharmD or provider to evaluate maintenance inhaler change  Medication management . Pharmacist Clinical Goal(s): o Over the next 90 days, patient will work with PharmD and providers to maintain optimal medication adherence . Current pharmacy: [facility] . Interventions o Comprehensive medication review performed. o Continue current medication management strategy . Patient self care activities - Over the next 90 days, patient will: o Focus on medication adherence by monitoring copay amounts o Take medications as prescribed o Report any questions, affordability issues or concerns to PharmD and/or provider(s)  Initial goal documentation        Print copy of patient instructions provided.   Telephone follow up appointment with pharmacy team member scheduled for: 4 months  Milus Height, PharmD, [place], CTTS Clinical Pharmacist [facility] [phone]  Chronic Bronchitis, Adult Chronic bronchitis is long-lasting inflammation of the tubes that carry air into your lungs (bronchial tubes). This is inflammation that occurs:  On most days of the week.  For at least three months at a time.  Over a period of two years in a row. When the bronchial tubes are inflamed, they start to produce mucus. The inflammation and buildup of mucus make it more difficult to breathe. Chronic bronchitis is usually a permanent problem. It is one type of  chronic obstructive pulmonary disease (COPD). People with chronic bronchitis are more likely to get frequent colds or respiratory infections. What are the causes? Chronic bronchitis most often occurs in people who:  Have chronic, severe asthma.  Have a history of smoking.  Have asthma and smoke.  Have certain lung diseases.  Have had long-term exposure to certain irritating fumes or chemicals. What are the signs or symptoms? Symptoms of chronic bronchitis may include:  A cough that brings up mucus (productive cough).  Shortness of breath.  Loud breathing (wheezing).  Chest discomfort.  Frequent (recurring) colds or respiratory infections. Certain things can trigger chronic bronchitis symptoms or make them worse, such as:  Infections.  Stopping certain medicines.  Smoking.  Exposure to chemicals. How is this diagnosed? This condition may be diagnosed based on:  Your symptoms and medical history.  A physical exam.  A chest X-ray.  Lung (pulmonary) function tests. How is this treated? There is no cure for chronic bronchitis, but treatment can help control your symptoms. Treatment may include:  Using a cool mist vaporizer or humidifier to make it easier to breathe.  Drinking more fluids. Drinking more makes your mucus thinner, which may make it easier to breathe.  Lifestyle changes, such as eating a healthier diet and getting more exercise.  Medicines, such as: ? Inhalers to improve air flow in and out of your lungs. ? Antibiotics to treat any bacterial infections you have, such as:  Lung infection (pneumonia).  Sinus infection.  A sudden, severe (acute) episode of bronchitis.  Oxygen therapy.  Preventing infections by keeping up to date on vaccinations, including the pneumonia and flu vaccines.  Pulmonary rehabilitation. This is a program that helps you manage your breathing problems and improve your quality of life. It may last for up to 4-12 weeks and  may include exercise programs, education, counseling, and treatment support. Follow these instructions at home: Medicines  Take over-the-counter and prescription medicines only as told by your health care provider.  If you were prescribed an antibiotic medicine, take it as told by your health care provider. Do not stop taking the antibiotic even if you start to feel better. Preventing infections  Get vaccinations as told by your health care provider. Make sure you get a flu shot (influenza vaccine) every year.  Wash your hands often with soap and water. If soap and water are not available, use hand sanitizer.  Avoid contact with people who have symptoms of a cold or the flu. Managing symptoms   Do not smoke, and avoid secondhand smoke. Exposure to cigarette smoke or irritating chemicals will make bronchitis worse. If you smoke and you need help quitting, ask your health care provider. Quitting smoking will help your lungs heal faster.  Use an inhaler, cool mist vaporizer, or humidifier as told by your health care provider.  Avoid pollen, dust, animal dander, molds, smoke, and other things that cause shortness of breath or wheezing attacks.  Use oxygen therapy at home as directed. Follow instructions from your health care provider about how to use oxygen safely and take precautions to prevent fire. Make sure you never smoke while  using oxygen or allow others to smoke in your home.  Do not wait to get medical care if you have any concerning symptoms or trouble breathing. Waiting could cause permanent injury and may be life threatening. General instructions  Talk with your health care provider about what activities are safe for you and about possible exercise routines. Regular exercise is very important to help you feel better.  Drink enough fluids to keep your urine pale yellow.  Keep all follow-up visits as told by your health care provider. This is important. Contact a health care  provider if:  You have coughing or shortness of breath that gets worse.  You have muscle aches.  You have chest pain.  Your mucus seems to get thicker.  Your mucus changes from clear or white to yellow, green, gray, or bloody. Get help right away if:  Your usual medicines do not stop your wheezing.  You have severe difficulty breathing. These symptoms may represent a serious problem that is an emergency. Do not wait to see if the symptoms will go away. Get medical help right away. Call your local emergency services (911 in the [place] Do not drive yourself to the hospital. Summary  Chronic bronchitis is long-lasting inflammation of the tubes that carry air into your lungs (bronchial tubes).  Chronic bronchitis is usually a permanent problem. It is one type of chronic obstructive pulmonary disease (COPD).  There is no cure for chronic bronchitis, but treatment can help control your symptoms.  Do not smoke, and avoid secondhand smoke. Exposure to cigarette smoke or irritating chemicals will make bronchitis worse. This information is not intended to replace advice given to you by your health care provider. Make sure you discuss any questions you have with your health care provider. Document Revised: 09/07/2018 Document Reviewed: 10/05/2017 [employer] Patient Education  [place].

## 2020-08-29 ENCOUNTER — Other Ambulatory Visit: Payer: Self-pay | Admitting: Family Medicine

## 2020-08-29 DIAGNOSIS — E785 Hyperlipidemia, unspecified: Secondary | ICD-10-CM

## 2020-08-29 DIAGNOSIS — E1169 Type 2 diabetes mellitus with other specified complication: Secondary | ICD-10-CM

## 2020-09-02 ENCOUNTER — Other Ambulatory Visit: Payer: Self-pay | Admitting: Family Medicine

## 2020-09-02 DIAGNOSIS — E1169 Type 2 diabetes mellitus with other specified complication: Secondary | ICD-10-CM

## 2020-09-02 DIAGNOSIS — E785 Hyperlipidemia, unspecified: Secondary | ICD-10-CM

## 2020-09-02 NOTE — Progress Notes (Signed)
Name: Cindy Griffin   MRN: [id number]    DOB: [birth year]   Date:09/03/2020       Progress Note  Subjective  Chief Complaint  Chief Complaint  Patient presents with  . Diabetes    HPI  DMII: we started Soliqus back in May however she has been off medication due to cost. She is now on 1500 mg of Metformin, 15 mg of Actos, 10 mg of Glipizide since she can only afford generic medications. Reminded her to only take Glipizide before lunch since only drinking coffee in the mornings. Glucose has been 90-200, A1C today is very high at 9.7 % , glucose was improving with Soliqua but we didn't get a chance to check her A1C while on the medication    SOB: seen by  Dr. Nehemiah Massed , last visit 05/01/2020 , she went for shortness of breast on exertion she will have an echo and also EKG stress test, she never had sleep study done, she was started on lasix and she states lower extremity edema has improved but no change in sob  . Seen by  Dr Raul Del, Spirometry was within normal limits. She has not been using Bevespi only using Ventolin prn   HTN: bp is at goal, she continues to have SOB with activity, no orthopnea or palpitation but has intermittent dizziness  History of colon cancer: very early recesction pT1N0( 0/21 lymphonod). Dr. Vicente Males did last colonoscopy 11/2018 that showed to small polyps, due for repeat next year   Hyperlipidemia: taking Atorvastatin and denies side effects of medications.Last LDL was at goal at 43   Morbid Obesity: she tried Contrave but made her vomit. We started her on Belvig in April 2017 and she lost weight, she was down 22 lbs by June when she had shoulder surgery, but she stopped exercising and taking medication and hadgained 12lbs post-surgery. She stopped Belviq because of cost and now off the marked . Weightwasdown to 227 lbs, but weigh is now stable in the 240's .    Chronic Bronchitis: she has a chronic productivecoughno recent episodes of flaresthat required  steroid. She was  on Bevespi but currently not taking it, she thinks because of cost  She has a cough has SOB and wheezing with activity and takes breaks when active to catch her breath, she saw Dr. Raul Del and had a spirometry but I am not able to see the results  Major Depression in remission : Long history of depression, but refuses medication. No suicidal thoughts or ideation.  Patient Active Problem List   Diagnosis Date Noted  . Bilateral leg edema 05/01/2020  . SOBOE (shortness of breath on exertion) 05/01/2020  . Colon cancer, ascending ([place]) 05/25/2019  . Mild episode of recurrent major depressive disorder ([place]) 08/01/2018  . SBO (small bowel obstruction) and ileocolonic anastomosis from recent colectomy 05/22/2018  . Cancer of ascending colon - pT1N0 (0/21) s/p lap right colectomy 05/11/2018 05/21/2018  . Hypomagnesemia 05/21/2018  . Ileus following gastrointestinal surgery ([place]) 05/20/2018  . Essential hemorrhagic thrombocythemia ([place]) 03/29/2018  . Hypercalcemia 11/23/2015  . DDD (degenerative disc disease), lumbar 07/28/2015  . Spinal stenosis at L4-L5 level 06/12/2015  . Benign essential HTN 05/25/2015  . Bunion 05/25/2015  . Cardiac enlargement 05/25/2015  . Cervical radicular pain 05/25/2015  . Back pain, chronic 05/25/2015  . Osteoarthritis 05/25/2015  . Dyslipidemia 05/25/2015  . Gastric reflux 05/25/2015  . Insomnia 05/25/2015  . Eczema intertrigo 05/25/2015  . Bronchitis, chronic, mucopurulent ([place]) 05/25/2015  .  Numerous moles 05/25/2015  . Morbid obesity ([place]) 05/25/2015  . Perennial allergic rhinitis 05/25/2015  . DM (diabetes mellitus), type 2, uncontrolled, with renal complications ([place]) 32/20/2542  . Snores 05/25/2015  . History of artificial joint 05/25/2015  . Degeneration of intervertebral disc of cervical region 02/06/2015  . Neuritis or radiculitis due to rupture of lumbar intervertebral disc 01/14/2015  . Abnormal presence of protein in urine  05/04/2010  . Vitamin D deficiency 05/04/2010    Past Surgical History:  Procedure Laterality Date  . ABDOMINAL HYSTERECTOMY     due to cancer-partial  . BREAST EXCISIONAL BIOPSY Right yrs ago   Benign  . COLONOSCOPY N/A 04/11/2015   Procedure: COLONOSCOPY;  Surgeon: Hulen Luster, MD;  Location: [facility] ENDOSCOPY;  Service: Gastroenterology;  Laterality: N/A;  . COLONOSCOPY WITH PROPOFOL N/A 03/13/2018   Procedure: COLONOSCOPY WITH PROPOFOL;  Surgeon: [name], MD;  Location: [facility] ENDOSCOPY;  Service: Gastroenterology;  Laterality: N/A;  . COLONOSCOPY WITH PROPOFOL N/A 12/25/2018   Procedure: COLONOSCOPY WITH PROPOFOL;  Surgeon: [name], MD;  Location: [facility] ENDOSCOPY;  Service: Gastroenterology;  Laterality: N/A;  . JOINT REPLACEMENT     left knee x3,  right knee 2005  . LAPAROSCOPIC RIGHT COLECTOMY Right 05/11/2018   Procedure: LAPAROSCOPIC RIGHT HEMICOLECTOMY ERAS PATHWAY;  Surgeon: Ileana Roup, MD;  Location: [facility] ORS;  Service: General;  Laterality: Right;  . SHOULDER ARTHROSCOPY WITH OPEN ROTATOR CUFF REPAIR Left 05/18/2016   Procedure: SHOULDER ARTHROSCOPY WITH OPEN ROTATOR CUFF REPAIR,distal clavicle excision, decompression;  Surgeon: Corky Mull, MD;  Location: [facility] ORS;  Service: Orthopedics;  Laterality: Left;  . SPINAL FUSION  1994   C-Spine  . Transforaminal Epidural  02/20/2015   Injection into cervical spine- C5-6 Dr. Phyllis Ginger    Family History  Problem Relation Age of Onset  . Diabetes Mother   . Kidney disease Mother   . Hypertension Mother   . Healthy Father   . Asthma Daughter   . Diabetes Sister   . Kidney disease Sister   . Pancreatic cancer Sister   . Diabetes Brother     Social History   Tobacco Use  . Smoking status: Never Smoker  . Smokeless tobacco: Never Used  . Tobacco comment: smoking cessation materials not required  Substance Use Topics  . Alcohol use: Not Currently    Alcohol/week: 0.0 standard drinks     Current Outpatient  Medications:  .  acetaminophen (TYLENOL) 500 MG tablet, Take 1,000 mg by mouth every 6 (six) hours as needed for moderate pain or headache. , Disp: , Rfl:  .  albuterol (VENTOLIN HFA) 108 (90 Base) MCG/ACT inhaler, Inhale 2 puffs into the lungs every 6 (six) hours as needed for wheezing or shortness of breath., Disp: 18 g, Rfl: 0 .  Ascorbic Acid (VITAMIN C) 1000 MG tablet, Take 1,000 mg by mouth daily., Disp: , Rfl:  .  aspirin EC 81 MG tablet, Take 1 tablet (81 mg total) by mouth daily., Disp: 90 tablet, Rfl: 1 .  atorvastatin (LIPITOR) 80 MG tablet, Take 1 tablet (80 mg total) by mouth daily., Disp: 90 tablet, Rfl: 1 .  BD PEN NEEDLE MICRO U/F 32G X 6 MM MISC, 1 each by Other route daily., Disp: 100 each, Rfl: 2 .  Biotin 5000 MCG CAPS, Take by mouth., Disp: , Rfl:  .  Carboxymethylcellul-Glycerin (LUBRICATING EYE DROPS OP), Place 2 drops into both eyes 2 (two) times daily as needed (for dry eyes)., Disp: , Rfl:  .  cetirizine (ZYRTEC) 10 MG tablet, Take 10 mg by mouth every other day as needed for allergies. , Disp: , Rfl:  .  Cholecalciferol (VITAMIN D-3) 1000 units CAPS, Take 1,000 Units by mouth daily. , Disp: , Rfl:  .  famotidine (PEPCID) 20 MG tablet, TAKE 1 TABLET BY MOUTH TWICE DAILY IN PLACE OF RANITIDINE, Disp: 180 tablet, Rfl: 1 .  fluticasone (FLONASE) 50 MCG/ACT nasal spray, USE TWO SPRAY IN EACH NOSTRIL ONCE DAILY, Disp: 48 g, Rfl: 0 .  furosemide (LASIX) 20 MG tablet, Take 1 tablet (20 mg total) by mouth daily., Disp: 90 tablet, Rfl: 1 .  glipiZIDE (GLUCOTROL XL) 10 MG 24 hr tablet, Take 1 tablet (10 mg total) by mouth daily with breakfast., Disp: 90 tablet, Rfl: 0 .  Glycopyrrolate-Formoterol (BEVESPI AEROSPHERE) 9-4.8 MCG/ACT AERO, Inhale 2 puffs into the lungs at bedtime., Disp: 10.7 g, Rfl: 2 .  Lancets (ONETOUCH DELICA PLUS [id number]) MISC, CHECK GLUCOSE THREE TIMES DAILY, Disp: , Rfl:  .  lisinopril (ZESTRIL) 40 MG tablet, Take 1 tablet (40 mg total) by mouth at bedtime.,  Disp: 90 tablet, Rfl: 1 .  meclizine (ANTIVERT) 25 MG tablet, Take 1 tablet (25 mg total) by mouth every 6 (six) hours., Disp: 30 tablet, Rfl: 0 .  metFORMIN (GLUCOPHAGE-XR) 750 MG 24 hr tablet, TAKE 2 TABLETS BY MOUTH ONCE DAILY WITH BREAKFAST, Disp: 180 tablet, Rfl: 1 .  Multiple Vitamins-Minerals (MULTIVITAMIN PO), Take 1 tablet by mouth daily., Disp: , Rfl:  .  Omega-3 1000 MG CAPS, Take by mouth., Disp: , Rfl:  .  pioglitazone (ACTOS) 15 MG tablet, Take 1 tablet (15 mg total) by mouth daily., Disp: 90 tablet, Rfl: 0 .  pregabalin (LYRICA) 75 MG capsule, Take 1 capsule (75 mg total) by mouth 2 (two) times daily., Disp: 180 capsule, Rfl: 1 .  tiZANidine (ZANAFLEX) 2 MG tablet, 1 po tid prn, Disp: , Rfl:  .  traMADol (ULTRAM) 50 MG tablet, Take 1 tablet by mouth 2 (two) times daily as needed. , Disp: , Rfl: 3 .  traZODone (DESYREL) 50 MG tablet, TAKE ONE-HALF TO ONE TABLET BY MOUTH AT BEDTIME AS NEEDED FOR SLEEP, Disp: 90 tablet, Rfl: 0 .  Turmeric 500 MG CAPS, Take by mouth., Disp: , Rfl:  .  vitamin B-12 (CYANOCOBALAMIN) 500 MCG tablet, Take 1,000 mcg by mouth daily., Disp: , Rfl:  .  SOLIQUA 100-33 UNT-MCG/ML SOPN, INJECT 10 TO 50 UNITS SUBCUTANEOUSLY DAILY (Patient not taking: Reported on 07/15/2020), Disp: 15 mL, Rfl: 0  No Known Allergies  I personally reviewed active problem list, medication list, allergies, family history, social history, health maintenance with the patient/caregiver today.   ROS  Constitutional: Negative for fever or weight change.  Respiratory: Negative for cough , positive for  shortness of breath with activity .   Cardiovascular: Negative for chest pain or palpitations.  Gastrointestinal: Negative for abdominal pain, no bowel changes.  Musculoskeletal: Negative for gait problem or joint swelling.  Skin: Negative for rash.  Neurological: positive  for intermittent dizziness but no  headache.  No other specific complaints in a complete review of systems (except  as listed in HPI above).  Objective  Vitals:   09/03/20 1328  BP: 128/76  Pulse: 92  Temp: 98 F (36.7 C)  SpO2: 96%  Weight: 236 lb 3.2 oz (107.1 kg)  Height: 5\' 4"  (1.626 m)    Body mass index is 40.54 kg/m.  Physical Exam  Constitutional: Patient appears well-developed and well-nourished. Obese  No distress.  HEENT: head atraumatic, normocephalic, pupils equal and reactive to light,neck supple Cardiovascular: Normal rate, regular rhythm and normal heart sounds.  No murmur heard. Trace  BLE edema. Pulmonary/Chest: Effort normal and breath sounds normal. No respiratory distress. Abdominal: Soft.  There is no tenderness. Psychiatric: Patient has a normal mood and affect. behavior is normal. Judgment and thought content normal.  Recent Results (from the past 2160 hour(s))  CEA     Status: Abnormal   Collection Time: 06/23/20  1:26 PM  Result Value Ref Range   CEA 5.8 (H) 0.0 - 4.7 ng/mL    Comment: (NOTE)                             Nonsmokers          <3.9                             Smokers             <5.6 [employer] Electrochemiluminescence Immunoassay (ECLIA) Values obtained with different assay methods or kits cannot be used interchangeably.  Results cannot be interpreted as absolute evidence of the presence or absence of malignant disease. Performed At: [facility] [place], [place] [id number] Rush Farmer MD OE:[id number]   CBC with Differential     Status: Abnormal   Collection Time: 06/23/20  1:26 PM  Result Value Ref Range   WBC 7.3 4.0 - 10.5 K/uL   RBC 4.22 3.87 - 5.11 MIL/uL   Hemoglobin 11.8 (L) 12.0 - 15.0 g/dL   HCT 36.0 36 - 46 %   MCV 85.3 80.0 - 100.0 fL   MCH 28.0 26.0 - 34.0 pg   MCHC 32.8 30.0 - 36.0 g/dL   RDW 14.6 11.5 - 15.5 %   Platelets 282 150 - 400 K/uL   nRBC 0.0 0.0 - 0.2 %   Neutrophils Relative % 60 %   Neutro Abs 4.4 1.7 - 7.7 K/uL   Lymphocytes Relative 29 %   Lymphs Abs 2.1 0.7 - 4.0 K/uL    Monocytes Relative 7 %   Monocytes Absolute 0.5 0 - 1 K/uL   Eosinophils Relative 3 %   Eosinophils Absolute 0.2 0 - 0 K/uL   Basophils Relative 1 %   Basophils Absolute 0.1 0 - 0 K/uL   Immature Granulocytes 0 %   Abs Immature Granulocytes 0.02 0.00 - 0.07 K/uL    Comment: Performed at [facility], [address] [place], [place] [address]  Comprehensive metabolic panel     Status: Abnormal   Collection Time: 06/23/20  1:26 PM  Result Value Ref Range   Sodium 140 135 - 145 mmol/L   Potassium 3.9 3.5 - 5.1 mmol/L   Chloride 105 98 - 111 mmol/L   CO2 26 22 - 32 mmol/L   Glucose, Bld 212 (H) 70 - 99 mg/dL    Comment: Glucose reference range applies only to samples taken after fasting for at least 8 hours.   BUN 17 8 - 23 mg/dL   Creatinine, Ser 0.98 0.44 - 1.00 mg/dL   Calcium 9.1 8.9 - 10.3 mg/dL   Total Protein 7.4 6.5 - 8.1 g/dL   Albumin 4.0 3.5 - 5.0 g/dL   AST 22 15 - 41 U/L   ALT 18 0 - 44 U/L   Alkaline Phosphatase 76 38 - 126  U/L   Total Bilirubin 0.5 0.3 - 1.2 mg/dL   GFR calc non Af Amer 57 (L) >60 mL/min   GFR calc Af Amer >60 >60 mL/min   Anion gap 9 5 - 15    Comment: Performed at [facility], [address] [name], [place] [address]  POCT HgB A1C     Status: Abnormal   Collection Time: 09/03/20  2:03 PM  Result Value Ref Range   Hemoglobin A1C 9.7 (A) 4.0 - 5.6 %   HbA1c POC (<> result, manual entry)     HbA1c, POC (prediabetic range)     HbA1c, POC (controlled diabetic range)      Diabetic Foot Exam: Diabetic Foot Exam - Simple   Simple Foot Form Diabetic Foot exam was performed with the following findings: Yes 09/03/2020  2:15 PM  Visual Inspection No deformities, no ulcerations, no other skin breakdown bilaterally: Yes Sensation Testing Intact to touch and monofilament testing bilaterally: Yes Pulse Check Posterior Tibialis and Dorsalis pulse intact bilaterally: Yes Comments      PHQ2/9: Depression screen [facility] 2/9 07/15/2020  05/29/2020 04/08/2020 03/12/2020 11/12/2019  Decreased Interest 0 0 0 0 1  Down, Depressed, Hopeless 0 0 0 0 1  PHQ - 2 Score 0 0 0 0 2  Altered sleeping - 0 0 0 1  Tired, decreased energy - 0 0 0 1  Change in appetite - 0 0 0 0  Feeling bad or failure about yourself  - 0 0 0 0  Trouble concentrating - 0 0 0 0  Moving slowly or fidgety/restless - 0 0 0 0  Suicidal thoughts - 0 0 0 0  PHQ-9 Score - 0 0 0 4  Difficult doing work/chores - - - - Not difficult at all  Some recent data might be hidden    phq 9 is negative   Fall Risk: Fall Risk  09/03/2020 07/15/2020 05/29/2020 04/08/2020 03/12/2020  Falls in the past year? 1 1 0 0 0  Number falls in past yr: 0 0 0 0 0  Injury with Fall? 1 1 0 0 0  Comment - - - - -  Risk for fall due to : - History of fall(s) - - -  Risk for fall due to: Comment - - - - -  Follow up - Falls prevention discussed - - -     Functional Status Survey: Is the patient deaf or have difficulty hearing?: No Does the patient have difficulty seeing, even when wearing glasses/contacts?: No Does the patient have difficulty concentrating, remembering, or making decisions?: No Does the patient have difficulty walking or climbing stairs?: Yes Does the patient have difficulty dressing or bathing?: No Does the patient have difficulty doing errands alone such as visiting a doctor's office or shopping?: No   Assessment & Plan   1. DM (diabetes mellitus), type 2, uncontrolled, with renal complications (HCC)  - POCT HgB A1C  Out of control, cannot afford cost of medication, discussed importance of cutting down on calories    2. Need for immunization against influenza  - Flu Vaccine QUAD High Dose(Fluad)  1. DM (diabetes mellitus), type 2, uncontrolled, with renal complications (HCC)  - POCT HgB A1C  2. Need for immunization against influenza  - Flu Vaccine QUAD High Dose(Fluad)  3. Dyslipidemia associated with type 2 diabetes mellitus ([place])   4. Dyspnea on  exertion  Stable and likely multifactorial   5. Major depression in remission [facility])  Doing well at  this time  6. Morbid obesity ([place])  Discussed with the patient the risk posed by an increased BMI. Discussed importance of portion control, calorie counting and at least 150 minutes of physical activity weekly. Avoid sweet beverages and drink more water. Eat at least 6 servings of fruit and vegetables daily   7. Benign essential HTN   8. Mucopurulent chronic bronchitis ([place])   9. Lower extremity edema   10. B12 deficiency   11. Vitamin D deficiency   12. Type 2 diabetes mellitus with peripheral neuropathy (HCC)   13. History of recent fall

## 2020-09-03 ENCOUNTER — Other Ambulatory Visit: Payer: Self-pay

## 2020-09-03 ENCOUNTER — Ambulatory Visit (INDEPENDENT_AMBULATORY_CARE_PROVIDER_SITE_OTHER): Payer: Medicare HMO | Admitting: Family Medicine

## 2020-09-03 ENCOUNTER — Encounter: Payer: Self-pay | Admitting: Family Medicine

## 2020-09-03 VITALS — BP 128/76 | HR 92 | Temp 98.0°F | Ht 64.0 in | Wt 236.2 lb

## 2020-09-03 DIAGNOSIS — E538 Deficiency of other specified B group vitamins: Secondary | ICD-10-CM

## 2020-09-03 DIAGNOSIS — F325 Major depressive disorder, single episode, in full remission: Secondary | ICD-10-CM

## 2020-09-03 DIAGNOSIS — E1169 Type 2 diabetes mellitus with other specified complication: Secondary | ICD-10-CM

## 2020-09-03 DIAGNOSIS — R0600 Dyspnea, unspecified: Secondary | ICD-10-CM | POA: Diagnosis not present

## 2020-09-03 DIAGNOSIS — R600 Localized edema: Secondary | ICD-10-CM | POA: Diagnosis not present

## 2020-09-03 DIAGNOSIS — E1129 Type 2 diabetes mellitus with other diabetic kidney complication: Secondary | ICD-10-CM | POA: Diagnosis not present

## 2020-09-03 DIAGNOSIS — E1142 Type 2 diabetes mellitus with diabetic polyneuropathy: Secondary | ICD-10-CM

## 2020-09-03 DIAGNOSIS — Z96653 Presence of artificial knee joint, bilateral: Secondary | ICD-10-CM

## 2020-09-03 DIAGNOSIS — R0609 Other forms of dyspnea: Secondary | ICD-10-CM

## 2020-09-03 DIAGNOSIS — E1165 Type 2 diabetes mellitus with hyperglycemia: Secondary | ICD-10-CM

## 2020-09-03 DIAGNOSIS — E559 Vitamin D deficiency, unspecified: Secondary | ICD-10-CM

## 2020-09-03 DIAGNOSIS — Z23 Encounter for immunization: Secondary | ICD-10-CM

## 2020-09-03 DIAGNOSIS — J411 Mucopurulent chronic bronchitis: Secondary | ICD-10-CM | POA: Diagnosis not present

## 2020-09-03 DIAGNOSIS — I10 Essential (primary) hypertension: Secondary | ICD-10-CM | POA: Diagnosis not present

## 2020-09-03 DIAGNOSIS — L729 Follicular cyst of the skin and subcutaneous tissue, unspecified: Secondary | ICD-10-CM

## 2020-09-03 DIAGNOSIS — E785 Hyperlipidemia, unspecified: Secondary | ICD-10-CM

## 2020-09-03 DIAGNOSIS — IMO0002 Reserved for concepts with insufficient information to code with codable children: Secondary | ICD-10-CM

## 2020-09-03 DIAGNOSIS — Z9181 History of falling: Secondary | ICD-10-CM

## 2020-09-03 LAB — POCT GLYCOSYLATED HEMOGLOBIN (HGB A1C): Hemoglobin A1C: 9.7 % — AB (ref 4.0–5.6)

## 2020-09-03 MED ORDER — GLIPIZIDE ER 10 MG PO TB24
10.0000 mg | ORAL_TABLET | Freq: Every day | ORAL | 1 refills | Status: DC
Start: 2020-09-03 — End: 2020-12-10

## 2020-09-03 MED ORDER — PIOGLITAZONE HCL 15 MG PO TABS
15.0000 mg | ORAL_TABLET | Freq: Every day | ORAL | 1 refills | Status: DC
Start: 2020-09-03 — End: 2020-10-22

## 2020-09-04 ENCOUNTER — Encounter: Payer: Self-pay | Admitting: Family Medicine

## 2020-09-07 ENCOUNTER — Other Ambulatory Visit: Payer: Self-pay | Admitting: Family Medicine

## 2020-09-07 DIAGNOSIS — E1169 Type 2 diabetes mellitus with other specified complication: Secondary | ICD-10-CM

## 2020-09-07 DIAGNOSIS — E785 Hyperlipidemia, unspecified: Secondary | ICD-10-CM

## 2020-09-09 ENCOUNTER — Emergency Department
Admission: EM | Admit: 2020-09-09 | Discharge: 2020-09-09 | Disposition: A | Discharge status: Home / Self Care | Payer: Medicare HMO | Attending: Emergency Medicine | Admitting: Emergency Medicine

## 2020-09-09 ENCOUNTER — Other Ambulatory Visit: Payer: Self-pay

## 2020-09-09 ENCOUNTER — Ambulatory Visit: Payer: Self-pay | Admitting: *Deleted

## 2020-09-09 ENCOUNTER — Emergency Department: Payer: Medicare HMO

## 2020-09-09 DIAGNOSIS — Z20828 Contact with and (suspected) exposure to other viral communicable diseases: Secondary | ICD-10-CM | POA: Diagnosis not present

## 2020-09-09 DIAGNOSIS — J449 Chronic obstructive pulmonary disease, unspecified: Secondary | ICD-10-CM | POA: Diagnosis not present

## 2020-09-09 DIAGNOSIS — I10 Essential (primary) hypertension: Secondary | ICD-10-CM | POA: Diagnosis not present

## 2020-09-09 DIAGNOSIS — Z7982 Long term (current) use of aspirin: Secondary | ICD-10-CM | POA: Diagnosis not present

## 2020-09-09 DIAGNOSIS — E119 Type 2 diabetes mellitus without complications: Secondary | ICD-10-CM | POA: Insufficient documentation

## 2020-09-09 DIAGNOSIS — R42 Dizziness and giddiness: Secondary | ICD-10-CM | POA: Diagnosis not present

## 2020-09-09 DIAGNOSIS — Z79899 Other long term (current) drug therapy: Secondary | ICD-10-CM | POA: Diagnosis not present

## 2020-09-09 DIAGNOSIS — E1129 Type 2 diabetes mellitus with other diabetic kidney complication: Secondary | ICD-10-CM | POA: Insufficient documentation

## 2020-09-09 DIAGNOSIS — Z85038 Personal history of other malignant neoplasm of large intestine: Secondary | ICD-10-CM | POA: Diagnosis not present

## 2020-09-09 DIAGNOSIS — Z7984 Long term (current) use of oral hypoglycemic drugs: Secondary | ICD-10-CM | POA: Diagnosis not present

## 2020-09-09 DIAGNOSIS — Z03818 Encounter for observation for suspected exposure to other biological agents ruled out: Secondary | ICD-10-CM | POA: Diagnosis not present

## 2020-09-09 LAB — URINALYSIS, COMPLETE (UACMP) WITH MICROSCOPIC
Bilirubin Urine: NEGATIVE
Glucose, UA: NEGATIVE mg/dL
Hgb urine dipstick: NEGATIVE
Ketones, ur: NEGATIVE mg/dL
Leukocytes,Ua: NEGATIVE
Nitrite: NEGATIVE
Protein, ur: NEGATIVE mg/dL
Specific Gravity, Urine: 1.01 (ref 1.005–1.030)
WBC, UA: NONE SEEN WBC/hpf (ref 0–5)
pH: 5 (ref 5.0–8.0)

## 2020-09-09 LAB — BASIC METABOLIC PANEL
Anion gap: 11 (ref 5–15)
BUN: 18 mg/dL (ref 8–23)
CO2: 25 mmol/L (ref 22–32)
Calcium: 9.5 mg/dL (ref 8.9–10.3)
Chloride: 103 mmol/L (ref 98–111)
Creatinine, Ser: 0.85 mg/dL (ref 0.44–1.00)
GFR, Estimated: >60 mL/min (ref >60)
Glucose, Bld: 183 mg/dL — ABNORMAL HIGH (ref 70–99)
Potassium: 4.3 mmol/L (ref 3.5–5.1)
Sodium: 139 mmol/L (ref 135–145)

## 2020-09-09 LAB — CBC
HCT: 38.8 % (ref 36.0–46.0)
Hemoglobin: 12.3 g/dL (ref 12.0–15.0)
MCH: 27.4 pg (ref 26.0–34.0)
MCHC: 31.7 g/dL (ref 30.0–36.0)
MCV: 86.4 fL (ref 80.0–100.0)
Platelets: 292 10*3/uL (ref 150–400)
RBC: 4.49 MIL/uL (ref 3.87–5.11)
RDW: 14.9 % (ref 11.5–15.5)
WBC: 6.7 10*3/uL (ref 4.0–10.5)
nRBC: 0 % (ref 0.0–0.2)

## 2020-09-09 MED ORDER — DIAZEPAM 2 MG PO TABS
2.0000 mg | ORAL_TABLET | Freq: Once | ORAL | Status: AC
  Administered 2020-09-09: 2 mg via ORAL
  Filled 2020-09-09: qty 1

## 2020-09-09 MED ORDER — DIAZEPAM 5 MG PO TABS: 5.0000 mg | ORAL_TABLET | Freq: Three times a day (TID) | ORAL | 0 refills | Status: DC | PRN

## 2020-09-09 NOTE — Telephone Encounter (Signed)
Patient called with complaints of vertigo since 09/04/20; she has been taking Meclizine every 6 hours with little relief in symptoms; the pt also said her insuulin went from $41 to $125 and she was placed on Glipizide and Actos instead; her children agreed to pay for her insulin and she picked it up on 09/08/20; the pt would like to know how she is to take the medication; recommendations made per nurse triage protocol but the pt refused going to the ED; She is seen by Dr Ancil Boozer, [place]; spoke with Suanne Marker and she concurs the pt should be evaluated in the ED; the pt was also advised not to take insulin until she has discussed this with Dr Ancil Boozer; she verbalized understanding.  Reason for Disposition . [1] Dizziness (vertigo) present now AND [2] one or more STROKE RISK FACTORS (i.e., hypertension, diabetes, prior stroke/TIA, heart attack)  (Exception: prior physician evaluation for this AND no different/worse than usual)  Answer Assessment - Initial Assessment Questions 1. DESCRIPTION: "Describe your dizziness."     vertigo 2. VERTIGO: "Do you feel like either you or the room is spinning or tilting?"    yes 3. LIGHTHEADED: "Do you feel lightheaded?" (e.g., somewhat faint, woozy, weak upon standing)    yes 4. SEVERITY: "How bad is it?"  "Can you walk?"   - MILD: Feels unsteady but walking normally.   - MODERATE: Feels very unsteady when walking, but not falling; interferes with normal activities (e.g., school, work) .   - SEVERE: Unable to walk without falling, or requires assistance to walk without falling.     moderate 5. ONSET:  "When did the dizziness begin?"     Pt can not remeber 6. AGGRAVATING FACTORS: "Does anything make it worse?" (e.g., standing, change in head position)   Changing position of her head 7. CAUSE: "What do you think is causing the dizziness?"     vertigo 8. RECURRENT SYMPTOM: "Have you had dizziness before?" If Yes, ask: "When was the last time?" "What happened that  time?"     1 year ago 9. OTHER SYMPTOMS: "Do you have any other symptoms?" (e.g., headache, weakness, numbness, vomiting, earache)   Vomit x 1, nausea, "neck tight"but had fusion 10. PREGNANCY: "Is there any chance you are pregnant?" "When was your last menstrual period?"      n/a  Protocols used: DIZZINESS - VERTIGO-A-AH

## 2020-09-09 NOTE — ED Triage Notes (Signed)
Pt comes via POV from [facility] with c/o increased dizziness and vertigo. Pt states her PCP sent her here further evaluation and possible CT scan.  Pt states pain to head and dizziness. Pt states when PCP laid her down she felt like she was going to pass out.

## 2020-09-09 NOTE — Discharge Instructions (Addendum)
Please do you not tramadol or any other sedating medication or alcohol when you take the valium. It can cause difficulty with breathing and over sedation. Please follow up with the ENT doctors. Please seek medical attention for any high fevers, chest pain, shortness of breath, change in behavior, persistent vomiting, bloody stool or any other new or concerning symptoms.
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
## 2020-09-10 ENCOUNTER — Telehealth: Payer: Self-pay | Admitting: Family Medicine

## 2020-09-10 NOTE — Telephone Encounter (Signed)
Copied from [place] [id number]. Topic: General - Other >> Sep 10, 2020 10:46 AM Erick Blinks wrote: Reason for CRM: Theressa Stamps called requesting feedback about medications questions, please advise   Best contact: [phone]

## 2020-09-11 ENCOUNTER — Ambulatory Visit: Payer: Self-pay | Admitting: Surgery

## 2020-09-11 ENCOUNTER — Other Ambulatory Visit: Payer: Self-pay | Admitting: Family Medicine

## 2020-09-11 ENCOUNTER — Ambulatory Visit
Admission: RE | Admit: 2020-09-11 | Discharge: 2020-09-11 | Disposition: A | Discharge status: Home / Self Care | Payer: Medicare HMO | Source: Ambulatory Visit | Attending: Family Medicine | Admitting: Family Medicine

## 2020-09-11 ENCOUNTER — Other Ambulatory Visit: Payer: Self-pay

## 2020-09-11 DIAGNOSIS — Z1231 Encounter for screening mammogram for malignant neoplasm of breast: Secondary | ICD-10-CM

## 2020-09-11 DIAGNOSIS — K219 Gastro-esophageal reflux disease without esophagitis: Secondary | ICD-10-CM

## 2020-09-11 DIAGNOSIS — R42 Dizziness and giddiness: Secondary | ICD-10-CM | POA: Diagnosis not present

## 2020-09-15 ENCOUNTER — Other Ambulatory Visit: Payer: Self-pay | Admitting: Family Medicine

## 2020-09-15 DIAGNOSIS — N6320 Unspecified lump in the left breast, unspecified quadrant: Secondary | ICD-10-CM

## 2020-09-15 DIAGNOSIS — R928 Other abnormal and inconclusive findings on diagnostic imaging of breast: Secondary | ICD-10-CM

## 2020-09-15 DIAGNOSIS — K219 Gastro-esophageal reflux disease without esophagitis: Secondary | ICD-10-CM

## 2020-09-16 ENCOUNTER — Ambulatory Visit: Payer: Medicare HMO | Admitting: Surgery

## 2020-09-16 ENCOUNTER — Other Ambulatory Visit: Payer: Self-pay

## 2020-09-16 ENCOUNTER — Encounter: Payer: Self-pay | Admitting: Surgery

## 2020-09-16 VITALS — BP 143/86 | HR 80 | Temp 98.3°F | Resp 12 | Ht 67.0 in | Wt 234.0 lb

## 2020-09-16 DIAGNOSIS — L723 Sebaceous cyst: Secondary | ICD-10-CM | POA: Diagnosis not present
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
## 2020-09-16 NOTE — Patient Instructions (Addendum)
Please see your follow up appointment listed below.  We have removed a Cyst in our office today.  You have sutures under the skin that will dissolve and also dermabond (skin glue) on top of your skin which will come off on it's own in 10-14 days.  You may shower in 48 hours, this is on 09/18/2020  Avoid Strenuous activities that will make you sweat during the next 48 hours to avoid the glue coming off prematurely. Avoid activities that will place pressure to this area of the body for 1-2 weeks to avoid re-injury to incision site.  Please see your follow-up appointment provided. We will see you back in office to make sure this area is healed and to review the final pathology. If you have any questions or concerns prior to this appointment, call our office and speak with a nurse.    Excision of Skin Cysts or Lesions Excision of a skin lesion refers to the removal of a section of skin by making small cuts (incisions) in the skin. This procedure may be done to remove a cancerous (malignant) or noncancerous (benign) growth on the skin. It is typically done to treat or prevent cancer or infection. It may also be done to improve cosmetic appearance. The procedure may be done to remove:  Cancerous growths, such as basal cell carcinoma, squamous cell carcinoma, or melanoma.  Noncancerous growths, such as a cyst or lipoma.  Growths, such as moles or skin tags, which may be removed for cosmetic reasons.  Various excision or surgical techniques may be used depending on your condition, the location of the lesion, and your overall health. Tell a health care provider about:  Any allergies you have.  All medicines you are taking, including vitamins, herbs, eye drops, creams, and over-the-counter medicines.  Any problems you or family members have had with anesthetic medicines.  Any blood disorders you have.  Any surgeries you have had.  Any medical conditions you have.  Whether you are  pregnant or may be pregnant. What are the risks? Generally, this is a safe procedure. However, problems may occur, including:  Bleeding.  Infection.  Scarring.  Recurrence of the cyst, lipoma, or cancer.  Changes in skin sensation or appearance, such as discoloration or swelling.  Reaction to the anesthetics.  Allergic reaction to surgical materials or ointments.  Damage to nerves, blood vessels, muscles, or other structures.  Continued pain.  What happens before the procedure?  Ask your health care provider about: ? Changing or stopping your regular medicines. This is especially important if you are taking diabetes medicines or blood thinners. ? Taking medicines such as aspirin and ibuprofen. These medicines can thin your blood. Do not take these medicines before your procedure if your health care provider instructs you not to.  You may be asked to take certain medicines.  You may be asked to stop smoking.  You may have an exam or testing.  Plan to have someone take you home after the procedure.  Plan to have someone help you with activities during recovery. What happens during the procedure?  To reduce your risk of infection: ? Your health care team will wash or sanitize their hands. ? Your skin will be washed with soap.  You will be given a medicine to numb the area (local anesthetic).  One of the following excision techniques will be performed.  At the end of any of these procedures, antibiotic ointment will be applied as needed. Each of the following techniques  may vary among health care providers and hospitals. Complete Surgical Excision The area of skin that needs to be removed will be marked with a pen. Using a small scalpel or scissors, the surgeon will gently cut around and under the lesion until it is completely removed. The lesion will be placed in a fluid and sent to the lab for examination. If necessary, bleeding will be controlled with a device that  delivers heat (electrocautery). The edges of the wound may be stitched (sutured) together, and a bandage (dressing) will be applied. This procedure may be performed to treat a cancerous growth or a noncancerous cyst or lesion. Excision of a Cyst The surgeon will make an incision on the cyst. The entire cyst will be removed through the incision. The incision may be closed with sutures. Shave Excision During shave excision, the surgeon will use a small blade or an electrically heated loop instrument to shave off the lesion. This may be done to remove a mole or a skin tag. The wound will usually be left to heal on its own without sutures. Punch Excision During punch excision, the surgeon will use a small tool that is like a cookie cutter or a hole punch to cut a circle shape out of the skin. The outer edges of the skin will be sutured together. This may be done to remove a mole or a scar or to perform a biopsy of the lesion. Mohs Micrographic Surgery During Mohs micrographic surgery, layers of the lesion will be removed with a scalpel or a loop instrument and will be examined right away under a microscope. Layers will be removed until all of the abnormal or cancerous tissue has been removed. This procedure is minimally invasive, and it ensures the best cosmetic outcome. It involves the removal of as little normal tissue as possible. Mohs is usually done to treat skin cancer, such as basal cell carcinoma or squamous cell carcinoma, particularly on the face and ears. Depending on the size of the surgical wound, it may be sutured closed. What happens after the procedure?  Return to your normal activities as told by your health care provider.  Talk with your health care provider to discuss any test results, treatment options, and if necessary, the need for more tests. This information is not intended to replace advice given to you by your health care provider. Make sure you discuss any questions you have with  your health care provider. Document Released: 02/09/2010 Document Revised: 04/22/2016 Document Reviewed: 01/01/2015 Elsevier Interactive Patient Education  [employer].

## 2020-09-23 ENCOUNTER — Other Ambulatory Visit: Payer: Self-pay | Admitting: Family Medicine

## 2020-09-23 DIAGNOSIS — I10 Essential (primary) hypertension: Secondary | ICD-10-CM

## 2020-09-23 DIAGNOSIS — E1142 Type 2 diabetes mellitus with diabetic polyneuropathy: Secondary | ICD-10-CM

## 2020-09-25 ENCOUNTER — Encounter: Payer: Self-pay | Admitting: Surgery

## 2020-09-25 ENCOUNTER — Other Ambulatory Visit: Payer: Self-pay

## 2020-09-25 ENCOUNTER — Ambulatory Visit (INDEPENDENT_AMBULATORY_CARE_PROVIDER_SITE_OTHER): Payer: Medicare HMO | Admitting: Surgery

## 2020-09-25 VITALS — BP 146/83 | HR 78 | Temp 97.9°F | Ht 64.0 in | Wt 235.0 lb

## 2020-09-25 DIAGNOSIS — L723 Sebaceous cyst: Secondary | ICD-10-CM

## 2020-09-25 NOTE — Progress Notes (Signed)
[facility] POST-OP OFFICE VISIT  09/25/2020  HPI: Cindy Griffin is a 74 y.o. female 9 days s/p excision of right shoulder sebaceous cyst.  She reports she is doing fine, but recently noted a small amount of brownish drainage last night.  She has a little bit of itching, and notes a degree of tightness/pulling sensation.  Vital signs: BP (!) 146/83   Pulse 78   Temp 97.9 F (36.6 C) (Oral)   Ht 5\' 4"  (1.626 m)   Wt 235 lb (106.6 kg)   SpO2 97%   BMI 40.34 kg/m    Physical Exam: Constitutional: She appears well  Skin: On the right shoulder her scar still has Dermabond intact, there is a small amount of fluid I can appreciate under the skin with a slight amount of ecchymosis present.  There is no erythema, induration or suspicion of infection.  I do not appreciate any drainage or discharge today.  Assessment/Plan: This is a 74 y.o. female 9 days s/p excision of dermal cyst right shoulder  Patient Active Problem List   Diagnosis Date Noted  . Bilateral leg edema 05/01/2020  . SOBOE (shortness of breath on exertion) 05/01/2020  . Colon cancer, ascending ([place]) 05/25/2019  . Mild episode of recurrent major depressive disorder ([place]) 08/01/2018  . SBO (small bowel obstruction) and ileocolonic anastomosis from recent colectomy 05/22/2018  . Cancer of ascending colon - pT1N0 (0/21) s/p lap right colectomy 05/11/2018 05/21/2018  . Hypomagnesemia 05/21/2018  . Ileus following gastrointestinal surgery ([place]) 05/20/2018  . Essential hemorrhagic thrombocythemia ([place]) 03/29/2018  . Hypercalcemia 11/23/2015  . DDD (degenerative disc disease), lumbar 07/28/2015  . Spinal stenosis at L4-L5 level 06/12/2015  . Benign essential HTN 05/25/2015  . Bunion 05/25/2015  . Cardiac enlargement 05/25/2015  . Cervical radicular pain 05/25/2015  . Back pain, chronic 05/25/2015  . Osteoarthritis 05/25/2015  . Dyslipidemia 05/25/2015  . Gastric reflux 05/25/2015  . Insomnia 05/25/2015   . Eczema intertrigo 05/25/2015  . Bronchitis, chronic, mucopurulent ([place]) 05/25/2015  . Numerous moles 05/25/2015  . Morbid obesity ([place]) 05/25/2015  . Perennial allergic rhinitis 05/25/2015  . DM (diabetes mellitus), type 2, uncontrolled, with renal complications ([place]) 16/08/9603  . Snores 05/25/2015  . History of artificial joint 05/25/2015  . Degeneration of intervertebral disc of cervical region 02/06/2015  . Neuritis or radiculitis due to rupture of lumbar intervertebral disc 01/14/2015  . Abnormal presence of protein in urine 05/04/2010  . Vitamin D deficiency 05/04/2010    -We discussed the risk of infection, anticipating perhaps an additional spot of drainage or so.  I do not see a role for prophylactic antibiotics.  Advised she may want to keep a small pad of gauze over the area should she have more drainage and not potentially staining any of her clothing.  Otherwise it seems to be healing as expected.  We will see her back in 2 weeks or as needed.   Ronny Bacon M.D., FACS 09/25/2020, 10:39 AM

## 2020-09-25 NOTE — Patient Instructions (Addendum)
Dr.Rodenberg recommends patient to try and apply cold compress (ice pack) to the area to help with inflammation.  Follow-up with our office as needed. Please call and ask to speak with a nurse if you develop questions or concerns.  Wound Care, Adult Taking care of your wound properly can help to prevent pain, infection, and scarring. It can also help your wound to heal more quickly. How to care for your wound Wound care      Follow instructions from your health care provider about how to take care of your wound. Make sure you: ? Wash your hands with soap and water before you change the bandage (dressing). If soap and water are not available, use hand sanitizer. ? Change your dressing as told by your health care provider. ? Leave stitches (sutures), skin glue, or adhesive strips in place. These skin closures may need to stay in place for 2 weeks or longer. If adhesive strip edges start to loosen and curl up, you may trim the loose edges. Do not remove adhesive strips completely unless your health care provider tells you to do that.  Check your wound area every day for signs of infection. Check for: ? Redness, swelling, or pain. ? Fluid or blood. ? Warmth. ? Pus or a bad smell.  Ask your health care provider if you should clean the wound with mild soap and water. Doing this may include: ? Using a clean towel to pat the wound dry after cleaning it. Do not rub or scrub the wound. ? Applying a cream or ointment. Do this only as told by your health care provider. ? Covering the incision with a clean dressing.  Ask your health care provider when you can leave the wound uncovered.  Keep the dressing dry until your health care provider says it can be removed. Do not take baths, swim, use a hot tub, or do anything that would put the wound underwater until your health care provider approves. Ask your health care provider if you can take showers. You may only be allowed to take sponge  baths. Medicines   If you were prescribed an antibiotic medicine, cream, or ointment, take or use the antibiotic as told by your health care provider. Do not stop taking or using the antibiotic even if your condition improves.  Take over-the-counter and prescription medicines only as told by your health care provider. If you were prescribed pain medicine, take it 30 or more minutes before you do any wound care or as told by your health care provider. General instructions  Return to your normal activities as told by your health care provider. Ask your health care provider what activities are safe.  Do not scratch or pick at the wound.  Do not use any products that contain nicotine or tobacco, such as cigarettes and e-cigarettes. These may delay wound healing. If you need help quitting, ask your health care provider.  Keep all follow-up visits as told by your health care provider. This is important.  Eat a diet that includes protein, vitamin A, vitamin C, and other nutrient-rich foods to help the wound heal. ? Foods rich in protein include meat, dairy, beans, nuts, and other sources. ? Foods rich in vitamin A include carrots and dark green, leafy vegetables. ? Foods rich in vitamin C include citrus, tomatoes, and other fruits and vegetables. ? Nutrient-rich foods have protein, carbohydrates, fat, vitamins, or minerals. Eat a variety of healthy foods including vegetables, fruits, and whole grains. Contact a health  care provider if:  You received a tetanus shot and you have swelling, severe pain, redness, or bleeding at the injection site.  Your pain is not controlled with medicine.  You have redness, swelling, or pain around the wound.  You have fluid or blood coming from the wound.  Your wound feels warm to the touch.  You have pus or a bad smell coming from the wound.  You have a fever or chills.  You are nauseous or you vomit.  You are dizzy. Get help right away if:  You  have a red streak going away from your wound.  The edges of the wound open up and separate.  Your wound is bleeding, and the bleeding does not stop with gentle pressure.  You have a rash.  You faint.  You have trouble breathing. Summary  Always wash your hands with soap and water before changing your bandage (dressing).  To help with healing, eat foods that are rich in protein, vitamin A, vitamin C, and other nutrients.  Check your wound every day for signs of infection. Contact your health care provider if you suspect that your wound is infected. This information is not intended to replace advice given to you by your health care provider. Make sure you discuss any questions you have with your health care provider. Document Revised: 03/05/2019 Document Reviewed: 06/01/2016 Elsevier Patient Education  [employer].

## 2020-09-26 ENCOUNTER — Ambulatory Visit
Admission: RE | Admit: 2020-09-26 | Discharge: 2020-09-26 | Disposition: A | Discharge status: Home / Self Care | Payer: Medicare HMO | Source: Ambulatory Visit | Attending: Family Medicine | Admitting: Family Medicine

## 2020-09-26 DIAGNOSIS — R928 Other abnormal and inconclusive findings on diagnostic imaging of breast: Secondary | ICD-10-CM

## 2020-09-26 DIAGNOSIS — N6320 Unspecified lump in the left breast, unspecified quadrant: Secondary | ICD-10-CM

## 2020-09-26 DIAGNOSIS — R922 Inconclusive mammogram: Secondary | ICD-10-CM | POA: Diagnosis not present

## 2020-09-30 ENCOUNTER — Encounter: Payer: Medicare HMO | Admitting: Surgery

## 2020-10-07 ENCOUNTER — Ambulatory Visit (INDEPENDENT_AMBULATORY_CARE_PROVIDER_SITE_OTHER): Payer: Medicare HMO | Admitting: Surgery

## 2020-10-07 ENCOUNTER — Other Ambulatory Visit: Payer: Self-pay

## 2020-10-07 ENCOUNTER — Encounter: Payer: Self-pay | Admitting: Surgery

## 2020-10-07 VITALS — BP 151/83 | HR 72 | Temp 98.5°F | Ht 64.0 in | Wt 237.0 lb

## 2020-10-07 DIAGNOSIS — L723 Sebaceous cyst: Secondary | ICD-10-CM

## 2020-10-07 NOTE — Patient Instructions (Signed)
Dr.Rodenberg advised patient to resume with showering. Follow-up with our office as needed. Please call and ask to speak with a nurse if you develop questions or concerns.  Wound Care, Adult Taking care of your wound properly can help to prevent pain, infection, and scarring. It can also help your wound to heal more quickly. How to care for your wound Wound care      Follow instructions from your health care provider about how to take care of your wound. Make sure you: ? Wash your hands with soap and water before you change the bandage (dressing). If soap and water are not available, use hand sanitizer. ? Change your dressing as told by your health care provider. ? Leave stitches (sutures), skin glue, or adhesive strips in place. These skin closures may need to stay in place for 2 weeks or longer. If adhesive strip edges start to loosen and curl up, you may trim the loose edges. Do not remove adhesive strips completely unless your health care provider tells you to do that.  Check your wound area every day for signs of infection. Check for: ? Redness, swelling, or pain. ? Fluid or blood. ? Warmth. ? Pus or a bad smell.  Ask your health care provider if you should clean the wound with mild soap and water. Doing this may include: ? Using a clean towel to pat the wound dry after cleaning it. Do not rub or scrub the wound. ? Applying a cream or ointment. Do this only as told by your health care provider. ? Covering the incision with a clean dressing.  Ask your health care provider when you can leave the wound uncovered.  Keep the dressing dry until your health care provider says it can be removed. Do not take baths, swim, use a hot tub, or do anything that would put the wound underwater until your health care provider approves. Ask your health care provider if you can take showers. You may only be allowed to take sponge baths. Medicines   If you were prescribed an antibiotic medicine,  cream, or ointment, take or use the antibiotic as told by your health care provider. Do not stop taking or using the antibiotic even if your condition improves.  Take over-the-counter and prescription medicines only as told by your health care provider. If you were prescribed pain medicine, take it 30 or more minutes before you do any wound care or as told by your health care provider. General instructions  Return to your normal activities as told by your health care provider. Ask your health care provider what activities are safe.  Do not scratch or pick at the wound.  Do not use any products that contain nicotine or tobacco, such as cigarettes and e-cigarettes. These may delay wound healing. If you need help quitting, ask your health care provider.  Keep all follow-up visits as told by your health care provider. This is important.  Eat a diet that includes protein, vitamin A, vitamin C, and other nutrient-rich foods to help the wound heal. ? Foods rich in protein include meat, dairy, beans, nuts, and other sources. ? Foods rich in vitamin A include carrots and dark green, leafy vegetables. ? Foods rich in vitamin C include citrus, tomatoes, and other fruits and vegetables. ? Nutrient-rich foods have protein, carbohydrates, fat, vitamins, or minerals. Eat a variety of healthy foods including vegetables, fruits, and whole grains. Contact a health care provider if:  You received a tetanus shot and you have  swelling, severe pain, redness, or bleeding at the injection site.  Your pain is not controlled with medicine.  You have redness, swelling, or pain around the wound.  You have fluid or blood coming from the wound.  Your wound feels warm to the touch.  You have pus or a bad smell coming from the wound.  You have a fever or chills.  You are nauseous or you vomit.  You are dizzy. Get help right away if:  You have a red streak going away from your wound.  The edges of the wound  open up and separate.  Your wound is bleeding, and the bleeding does not stop with gentle pressure.  You have a rash.  You faint.  You have trouble breathing. Summary  Always wash your hands with soap and water before changing your bandage (dressing).  To help with healing, eat foods that are rich in protein, vitamin A, vitamin C, and other nutrients.  Check your wound every day for signs of infection. Contact your health care provider if you suspect that your wound is infected. This information is not intended to replace advice given to you by your health care provider. Make sure you discuss any questions you have with your health care provider. Document Revised: 03/05/2019 Document Reviewed: 06/01/2016 Elsevier Patient Education  [place].

## 2020-10-07 NOTE — Progress Notes (Signed)
[facility] POST-OP OFFICE VISIT  10/07/2020  HPI: Cindy Griffin is a 74 y.o. female 12 days s/p excision of dermal cyst right shoulder.  No complaints, minimal itching.  No drainage, minimally sore.  Vital signs: BP (!) 151/83   Pulse 72   Temp 98.5 F (36.9 C) (Oral)   Ht 5\' 4"  (1.626 m)   Wt 237 lb (107.5 kg)   SpO2 98%   BMI 40.68 kg/m    Physical Exam: Constitutional: Appears well.  Skin: Healed.  Assessment/Plan: This is a 74 y.o. female 12 days s/p right shoulder dermal cyst excision.  Patient Active Problem List   Diagnosis Date Noted  . Bilateral leg edema 05/01/2020  . SOBOE (shortness of breath on exertion) 05/01/2020  . Colon cancer, ascending ([place]) 05/25/2019  . Mild episode of recurrent major depressive disorder ([place]) 08/01/2018  . SBO (small bowel obstruction) and ileocolonic anastomosis from recent colectomy 05/22/2018  . Cancer of ascending colon - pT1N0 (0/21) s/p lap right colectomy 05/11/2018 05/21/2018  . Hypomagnesemia 05/21/2018  . Ileus following gastrointestinal surgery ([place]) 05/20/2018  . Essential hemorrhagic thrombocythemia ([place]) 03/29/2018  . Hypercalcemia 11/23/2015  . DDD (degenerative disc disease), lumbar 07/28/2015  . Spinal stenosis at L4-L5 level 06/12/2015  . Benign essential HTN 05/25/2015  . Bunion 05/25/2015  . Cardiac enlargement 05/25/2015  . Cervical radicular pain 05/25/2015  . Back pain, chronic 05/25/2015  . Osteoarthritis 05/25/2015  . Dyslipidemia 05/25/2015  . Gastric reflux 05/25/2015  . Insomnia 05/25/2015  . Eczema intertrigo 05/25/2015  . Bronchitis, chronic, mucopurulent ([place]) 05/25/2015  . Numerous moles 05/25/2015  . Morbid obesity ([place]) 05/25/2015  . Perennial allergic rhinitis 05/25/2015  . DM (diabetes mellitus), type 2, uncontrolled, with renal complications ([place]) 91/91/6606  . Snores 05/25/2015  . History of artificial joint 05/25/2015  . Degeneration of intervertebral disc of  cervical region 02/06/2015  . Neuritis or radiculitis due to rupture of lumbar intervertebral disc 01/14/2015  . Abnormal presence of protein in urine 05/04/2010  . Vitamin D deficiency 05/04/2010    -Follow-up as needed.   Ronny Bacon M.D., FACS 10/07/2020, 11:58 AM

## 2020-10-08 ENCOUNTER — Telehealth: Payer: Self-pay

## 2020-10-08 DIAGNOSIS — E119 Type 2 diabetes mellitus without complications: Secondary | ICD-10-CM | POA: Diagnosis not present

## 2020-10-08 LAB — HM DIABETES EYE EXAM

## 2020-10-08 NOTE — Progress Notes (Signed)
Chronic Care Management Pharmacy Assistant   Name: ANAIZ QAZI  MRN: [id number] DOB: 1946/07/13  Reason for Encounter: Medication Review  Patient Questions:  1.  Have you seen any other providers since your last visit? Yes, Steele Sizer (PCP), Mariana Arn (Internal Medicine), ED visit, [facility](Gen Surgery)  2.  Any changes in your medicines or health? Yes,  Stop Glipizide and Pioglitazone, start Soliqus 54 units daily. Given prescription for Valium due to vertigo at ED.   [name],  74 y.o. , female presents for their Follow-Up CCM visit with the clinical pharmacist via telephone.  PCP : Steele Sizer, MD  Allergies:  No Known Allergies  Medications: Outpatient Encounter Medications as of 10/08/2020  Medication Sig  . acetaminophen (TYLENOL) 500 MG tablet Take 1,000 mg by mouth every 6 (six) hours as needed for moderate pain or headache.   . albuterol (VENTOLIN HFA) 108 (90 Base) MCG/ACT inhaler Inhale 2 puffs into the lungs every 6 (six) hours as needed for wheezing or shortness of breath.  . Ascorbic Acid (VITAMIN C) 1000 MG tablet Take 1,000 mg by mouth daily.  [name] aspirin EC 81 MG tablet Take 1 tablet (81 mg total) by mouth daily.  [name] atorvastatin (LIPITOR) 80 MG tablet Take 1 tablet (80 mg total) by mouth daily.  . BD PEN NEEDLE MICRO U/F 32G X 6 MM MISC 1 each by Other route daily.  . Biotin 5000 MCG CAPS Take by mouth.  . Carboxymethylcellul-Glycerin (LUBRICATING EYE DROPS OP) Place 2 drops into both eyes 2 (two) times daily as needed (for dry eyes).  . cetirizine (ZYRTEC) 10 MG tablet Take 10 mg by mouth every other day as needed for allergies.   . Cholecalciferol (VITAMIN D-3) 1000 units CAPS Take 1,000 Units by mouth daily.   . diazepam (VALIUM) 5 MG tablet Take 1 tablet (5 mg total) by mouth every 8 (eight) hours as needed (vertigo).  . famotidine (PEPCID) 20 MG tablet TAKE 1 TABLET BY MOUTH TWICE DAILY IN  PLACE  OF  RANITIDINE  . fluticasone (FLONASE)  50 MCG/ACT nasal spray USE TWO SPRAY IN EACH NOSTRIL ONCE DAILY  . furosemide (LASIX) 20 MG tablet Take 1 tablet (20 mg total) by mouth daily.  [name] glipiZIDE (GLUCOTROL XL) 10 MG 24 hr tablet Take 1 tablet (10 mg total) by mouth daily with breakfast.  . Glycopyrrolate-Formoterol (BEVESPI AEROSPHERE) 9-4.8 MCG/ACT AERO Inhale 2 puffs into the lungs at bedtime.  . Lancets (ONETOUCH DELICA PLUS [id number]) MISC CHECK GLUCOSE THREE TIMES DAILY  . lisinopril (ZESTRIL) 40 MG tablet TAKE 1 TABLET BY MOUTH AT BEDTIME  . meclizine (ANTIVERT) 25 MG tablet Take 1 tablet (25 mg total) by mouth every 6 (six) hours.  . metFORMIN (GLUCOPHAGE-XR) 750 MG 24 hr tablet TAKE 2 TABLETS BY MOUTH ONCE DAILY WITH BREAKFAST  . Multiple Vitamins-Minerals (MULTIVITAMIN PO) Take 1 tablet by mouth daily.  . Omega-3 1000 MG CAPS Take by mouth.  . pioglitazone (ACTOS) 15 MG tablet Take 1 tablet (15 mg total) by mouth daily.  . pregabalin (LYRICA) 75 MG capsule Take 1 capsule (75 mg total) by mouth 2 (two) times daily.  Willeen Niece 100-33 UNT-MCG/ML SOPN Inject into the skin.  [name] tiZANidine (ZANAFLEX) 2 MG tablet 1 po tid prn  . traMADol (ULTRAM) 50 MG tablet Take 1 tablet by mouth 2 (two) times daily as needed.   . traZODone (DESYREL) 50 MG tablet TAKE ONE-HALF TO ONE TABLET BY MOUTH AT BEDTIME  AS NEEDED FOR SLEEP  . Turmeric 500 MG CAPS Take by mouth.  . vitamin B-12 (CYANOCOBALAMIN) 500 MCG tablet Take 1,000 mcg by mouth daily.   No facility-administered encounter medications on file as of 10/08/2020.    Current Diagnosis: Patient Active Problem List   Diagnosis Date Noted  . Bilateral leg edema 05/01/2020  . SOBOE (shortness of breath on exertion) 05/01/2020  . Colon cancer, ascending ([place]) 05/25/2019  . Mild episode of recurrent major depressive disorder ([place]) 08/01/2018  . SBO (small bowel obstruction) and ileocolonic anastomosis from recent colectomy 05/22/2018  . Cancer of ascending colon - pT1N0 (0/21) s/p lap  right colectomy 05/11/2018 05/21/2018  . Hypomagnesemia 05/21/2018  . Ileus following gastrointestinal surgery ([place]) 05/20/2018  . Essential hemorrhagic thrombocythemia ([place]) 03/29/2018  . Hypercalcemia 11/23/2015  . DDD (degenerative disc disease), lumbar 07/28/2015  . Spinal stenosis at L4-L5 level 06/12/2015  . Benign essential HTN 05/25/2015  . Bunion 05/25/2015  . Cardiac enlargement 05/25/2015  . Cervical radicular pain 05/25/2015  . Back pain, chronic 05/25/2015  . Osteoarthritis 05/25/2015  . Dyslipidemia 05/25/2015  . Gastric reflux 05/25/2015  . Insomnia 05/25/2015  . Eczema intertrigo 05/25/2015  . Bronchitis, chronic, mucopurulent ([place]) 05/25/2015  . Numerous moles 05/25/2015  . Morbid obesity ([place]) 05/25/2015  . Perennial allergic rhinitis 05/25/2015  . DM (diabetes mellitus), type 2, uncontrolled, with renal complications ([place]) 49/20/1007  . Snores 05/25/2015  . History of artificial joint 05/25/2015  . Degeneration of intervertebral disc of cervical region 02/06/2015  . Neuritis or radiculitis due to rupture of lumbar intervertebral disc 01/14/2015  . Abnormal presence of protein in urine 05/04/2010  . Vitamin D deficiency 05/04/2010    Goals Addressed   None     Follow-Up:  Patient Assistance Coordination   Patient called to tell me that she could not find her social security statement to turn in with her PAP for Novo Nordisc. Assured her that I still had the copy that she gave Clare Gandy in July and could utilize that for patient assistance papers that she would like to turn in. Patient will return papers ASAP.   Pt called back and would like a copy of her ss income statement and DL emailed to her niece at [email or web address] so that she can apply for assistance via [email or web address] for her telephone and Internet.

## 2020-10-09 ENCOUNTER — Other Ambulatory Visit: Payer: Self-pay | Admitting: Family Medicine

## 2020-10-09 DIAGNOSIS — E1121 Type 2 diabetes mellitus with diabetic nephropathy: Secondary | ICD-10-CM

## 2020-10-09 DIAGNOSIS — I10 Essential (primary) hypertension: Secondary | ICD-10-CM

## 2020-10-09 DIAGNOSIS — E1142 Type 2 diabetes mellitus with diabetic polyneuropathy: Secondary | ICD-10-CM

## 2020-10-13 ENCOUNTER — Telehealth: Payer: Self-pay | Admitting: Family Medicine

## 2020-10-13 ENCOUNTER — Other Ambulatory Visit: Payer: Self-pay

## 2020-10-13 MED ORDER — SOLIQUA 100-33 UNT-MCG/ML ~~LOC~~ SOPN
PEN_INJECTOR | SUBCUTANEOUS | 0 refills | Status: DC
Start: 2020-10-13 — End: 2020-10-17

## 2020-10-13 NOTE — Telephone Encounter (Signed)
Requested medication (s) are due for refill today: unsure historic  Requested medication (s) are on the active medication list: yes   Last refill:  unsure historic provider  Future visit scheduled: yes  Notes to clinic: Historic medication     Requested Prescriptions  Pending Prescriptions Disp Refills   [place] 100-33 UNT-MCG/ML SOPN [Pharmacy Med Name: Willeen Niece 100-33 UNT-MCG/ML Subcutaneous Solution Pen-injector] 15 mL 0    Sig: INJECT 10 TO 50 UNITS SUBCUTANEOUSLY ONCE DAILY      Endocrinology: Diabetes - Insulin + GLP-1 Receptor Agonist Combos 2 Failed - 10/13/2020  1:09 PM      Failed - HBA1C is between 0 and 7.9 and within 180 days    Hemoglobin A1C  Date Value Ref Range Status  09/03/2020 9.7 (A) 4.0 - 5.6 % Final   HbA1c, POC (controlled diabetic range)  Date Value Ref Range Status  07/03/2019 9.3 (A) 0.0 - 7.0 % Final          Passed - Cr in normal range and within 360 days    Creat  Date Value Ref Range Status  11/12/2019 0.76 0.60 - 0.93 mg/dL Final    Comment:    For patients >19 years of age, the reference limit for Creatinine is approximately 13% higher for people identified as African-American. .    Creatinine, Ser  Date Value Ref Range Status  09/09/2020 0.85 0.44 - 1.00 mg/dL Final   Creatinine, Urine  Date Value Ref Range Status  11/12/2019 77 20 - 275 mg/dL Final          Passed - AA eGFR in normal range and within 360 days    GFR, Est African American  Date Value Ref Range Status  11/12/2019 90 > OR = 60 mL/min/1.58m Final   GFR calc Af Amer  Date Value Ref Range Status  06/23/2020 >60 >60 mL/min Final   GFR, Est Non African American  Date Value Ref Range Status  11/12/2019 78 > OR = 60 mL/min/1.777mFinal   GFR, Estimated  Date Value Ref Range Status  09/09/2020 >60 >60 mL/min Final          Passed - Valid encounter within last 6 months    Recent Outpatient Visits           1 month ago DM (diabetes mellitus), type 2,  uncontrolled, with [facility] ([facility]  [facility]   4 months ago DM (diabetes mellitus), type 2, uncontrolled, with [facility] ([facility]  [facility]   6 months ago DM (diabetes mellitus), type 2, uncontrolled, with [facility] ([facility]  [facility]   7 months ago DM (diabetes mellitus), type 2, uncontrolled, with renal complications ([facility]  [facility]   11 months ago DM (diabetes mellitus), type 2, uncontrolled, with renal complications ([facility]  [facility]       Future Appointments             In 1 month SoAncil BoozerKrDrue StagerMD [facility] [place] In 9 months  [facility]

## 2020-10-17 ENCOUNTER — Other Ambulatory Visit: Payer: Self-pay

## 2020-10-17 ENCOUNTER — Other Ambulatory Visit: Payer: Self-pay | Admitting: Family Medicine

## 2020-10-17 DIAGNOSIS — J3089 Other allergic rhinitis: Secondary | ICD-10-CM

## 2020-10-17 MED ORDER — SOLIQUA 100-33 UNT-MCG/ML ~~LOC~~ SOPN
PEN_INJECTOR | SUBCUTANEOUS | 0 refills | Status: DC
Start: 2020-10-17 — End: 2020-11-16

## 2020-10-22 ENCOUNTER — Other Ambulatory Visit: Payer: Self-pay | Admitting: Family Medicine

## 2020-10-27 DIAGNOSIS — Z09 Encounter for follow-up examination after completed treatment for conditions other than malignant neoplasm: Secondary | ICD-10-CM | POA: Diagnosis not present

## 2020-10-27 DIAGNOSIS — Z0100 Encounter for examination of eyes and vision without abnormal findings: Secondary | ICD-10-CM | POA: Diagnosis not present

## 2020-10-27 DIAGNOSIS — Z96653 Presence of artificial knee joint, bilateral: Secondary | ICD-10-CM | POA: Diagnosis not present

## 2020-10-31 DIAGNOSIS — M542 Cervicalgia: Secondary | ICD-10-CM | POA: Diagnosis not present

## 2020-10-31 DIAGNOSIS — M9901 Segmental and somatic dysfunction of cervical region: Secondary | ICD-10-CM | POA: Diagnosis not present

## 2020-10-31 DIAGNOSIS — M9903 Segmental and somatic dysfunction of lumbar region: Secondary | ICD-10-CM | POA: Diagnosis not present

## 2020-10-31 DIAGNOSIS — M4306 Spondylolysis, lumbar region: Secondary | ICD-10-CM | POA: Diagnosis not present

## 2020-11-03 DIAGNOSIS — M9901 Segmental and somatic dysfunction of cervical region: Secondary | ICD-10-CM | POA: Diagnosis not present

## 2020-11-03 DIAGNOSIS — M4306 Spondylolysis, lumbar region: Secondary | ICD-10-CM | POA: Diagnosis not present

## 2020-11-03 DIAGNOSIS — M542 Cervicalgia: Secondary | ICD-10-CM | POA: Diagnosis not present

## 2020-11-03 DIAGNOSIS — M9903 Segmental and somatic dysfunction of lumbar region: Secondary | ICD-10-CM | POA: Diagnosis not present

## 2020-11-05 DIAGNOSIS — M9901 Segmental and somatic dysfunction of cervical region: Secondary | ICD-10-CM | POA: Diagnosis not present

## 2020-11-05 DIAGNOSIS — M542 Cervicalgia: Secondary | ICD-10-CM | POA: Diagnosis not present

## 2020-11-05 DIAGNOSIS — M4306 Spondylolysis, lumbar region: Secondary | ICD-10-CM | POA: Diagnosis not present

## 2020-11-05 DIAGNOSIS — M9903 Segmental and somatic dysfunction of lumbar region: Secondary | ICD-10-CM | POA: Diagnosis not present

## 2020-11-06 DIAGNOSIS — M9903 Segmental and somatic dysfunction of lumbar region: Secondary | ICD-10-CM | POA: Diagnosis not present

## 2020-11-06 DIAGNOSIS — M9901 Segmental and somatic dysfunction of cervical region: Secondary | ICD-10-CM | POA: Diagnosis not present

## 2020-11-06 DIAGNOSIS — M4306 Spondylolysis, lumbar region: Secondary | ICD-10-CM | POA: Diagnosis not present

## 2020-11-06 DIAGNOSIS — M542 Cervicalgia: Secondary | ICD-10-CM | POA: Diagnosis not present

## 2020-11-10 DIAGNOSIS — M4306 Spondylolysis, lumbar region: Secondary | ICD-10-CM | POA: Diagnosis not present

## 2020-11-10 DIAGNOSIS — M542 Cervicalgia: Secondary | ICD-10-CM | POA: Diagnosis not present

## 2020-11-10 DIAGNOSIS — M9903 Segmental and somatic dysfunction of lumbar region: Secondary | ICD-10-CM | POA: Diagnosis not present

## 2020-11-10 DIAGNOSIS — M9901 Segmental and somatic dysfunction of cervical region: Secondary | ICD-10-CM | POA: Diagnosis not present

## 2020-11-12 DIAGNOSIS — M9901 Segmental and somatic dysfunction of cervical region: Secondary | ICD-10-CM | POA: Diagnosis not present

## 2020-11-12 DIAGNOSIS — M9903 Segmental and somatic dysfunction of lumbar region: Secondary | ICD-10-CM | POA: Diagnosis not present

## 2020-11-12 DIAGNOSIS — M4306 Spondylolysis, lumbar region: Secondary | ICD-10-CM | POA: Diagnosis not present

## 2020-11-12 DIAGNOSIS — M542 Cervicalgia: Secondary | ICD-10-CM | POA: Diagnosis not present

## 2020-11-13 DIAGNOSIS — M9903 Segmental and somatic dysfunction of lumbar region: Secondary | ICD-10-CM | POA: Diagnosis not present

## 2020-11-13 DIAGNOSIS — M542 Cervicalgia: Secondary | ICD-10-CM | POA: Diagnosis not present

## 2020-11-13 DIAGNOSIS — M4306 Spondylolysis, lumbar region: Secondary | ICD-10-CM | POA: Diagnosis not present

## 2020-11-13 DIAGNOSIS — M9901 Segmental and somatic dysfunction of cervical region: Secondary | ICD-10-CM | POA: Diagnosis not present

## 2020-11-14 ENCOUNTER — Telehealth: Payer: Self-pay | Admitting: Family Medicine

## 2020-11-14 NOTE — Telephone Encounter (Signed)
Copied from [place] [id number]. Topic: General - Other >> Nov 14, 2020 11:23 AM Antonieta Iba C wrote: Reason for CRM: pt called in to request to speak with [facility]. Called the office and advised to send message to pharmacist Cristie Hem.    Pt is requesting a call back at the phone number on file.

## 2020-11-16 ENCOUNTER — Other Ambulatory Visit: Payer: Self-pay | Admitting: Family Medicine

## 2020-11-16 NOTE — Telephone Encounter (Signed)
Requested Prescriptions  Pending Prescriptions Disp Refills  . Insulin Glargine-Lixisenatide (SOLIQUA) 100-33 UNT-MCG/ML SOPN [Pharmacy Med Name: Soliqua 100-33 UNT-MCG/ML Subcutaneous Solution Pen-injector] 15 mL 0    Sig: INJECT 10 TO 50  SUBCUTANEOUSLY ONCE DAILY     Endocrinology: Diabetes - Insulin + GLP-1 Receptor Agonist Combos 2 Failed - 11/16/2020  9:16 AM      Failed - HBA1C is between 0 and 7.9 and within 180 days    Hemoglobin A1C  Date Value Ref Range Status  09/03/2020 9.7 (A) 4.0 - 5.6 % Final   HbA1c, POC (controlled diabetic range)  Date Value Ref Range Status  07/03/2019 9.3 (A) 0.0 - 7.0 % Final         Passed - Cr in normal range and within 360 days    Creat  Date Value Ref Range Status  11/12/2019 0.76 0.60 - 0.93 mg/dL Final    Comment:    For patients >49 years of age, the reference limit for Creatinine is approximately 13% higher for people identified as African-American. .    Creatinine, Ser  Date Value Ref Range Status  09/09/2020 0.85 0.44 - 1.00 mg/dL Final   Creatinine, Urine  Date Value Ref Range Status  11/12/2019 77 20 - 275 mg/dL Final         Passed - AA eGFR in normal range and within 360 days    GFR, Est African American  Date Value Ref Range Status  11/12/2019 90 > OR = 60 mL/min/1.73m2 Final   GFR calc Af Amer  Date Value Ref Range Status  06/23/2020 >60 >60 mL/min Final   GFR, Est Non African American  Date Value Ref Range Status  11/12/2019 78 > OR = 60 mL/min/1.73m2 Final   GFR, Estimated  Date Value Ref Range Status  09/09/2020 >60 >60 mL/min Final         Passed - Valid encounter within last 6 months    Recent Outpatient Visits          2 months ago DM (diabetes mellitus), type 2, uncontrolled, with renal complications ([facility])   [facility] Krichna, MD   5 months ago DM (diabetes mellitus), type 2, uncontrolled, with renal complications (H[facility])   [facility] Sowles,  Krichna, MD   7 months ago DM (diabetes mellitus), type 2, uncontrolled, with renal complications (HCC)   [facility] Sowles, Krichna, MD   8 months ago DM (diabetes mellitus), type 2, uncontrolled, with renal complications (HCC)   [facility] Sowles, Krichna, MD   1 year ago DM (diabetes mellitus), type 2, uncontrolled, with renal complications (HCC)   [facility] Sowles, Krichna, MD      Future Appointments            In 3 weeks Sowles, Krichna, MD [facility]   In 8 months  [facility]             

## 2020-11-17 DIAGNOSIS — M9903 Segmental and somatic dysfunction of lumbar region: Secondary | ICD-10-CM | POA: Diagnosis not present

## 2020-11-17 DIAGNOSIS — M4306 Spondylolysis, lumbar region: Secondary | ICD-10-CM | POA: Diagnosis not present

## 2020-11-17 DIAGNOSIS — M542 Cervicalgia: Secondary | ICD-10-CM | POA: Diagnosis not present

## 2020-11-17 DIAGNOSIS — M9901 Segmental and somatic dysfunction of cervical region: Secondary | ICD-10-CM | POA: Diagnosis not present

## 2020-11-18 ENCOUNTER — Telehealth: Payer: Self-pay

## 2020-11-18 NOTE — Progress Notes (Signed)
Chronic Care Management Pharmacy Assistant   Name: Cindy Griffin  MRN: [id number] DOB: [birth year]  Reason for Encounter: Medication Review  .  PCP : Steele Sizer, MD  Allergies:  No Known Allergies  Medications: Outpatient Encounter Medications as of 11/18/2020  Medication Sig  . acetaminophen (TYLENOL) 500 MG tablet Take 1,000 mg by mouth every 6 (six) hours as needed for moderate pain or headache.   . albuterol (VENTOLIN HFA) 108 (90 Base) MCG/ACT inhaler Inhale 2 puffs into the lungs every 6 (six) hours as needed for wheezing or shortness of breath.  . Ascorbic Acid (VITAMIN C) 1000 MG tablet Take 1,000 mg by mouth daily.  [name] aspirin EC 81 MG tablet Take 1 tablet (81 mg total) by mouth daily.  [name] atorvastatin (LIPITOR) 80 MG tablet Take 1 tablet (80 mg total) by mouth daily.  . BD PEN NEEDLE MICRO U/F 32G X 6 MM MISC 1 each by Other route daily.  . Biotin 5000 MCG CAPS Take by mouth.  . Carboxymethylcellul-Glycerin (LUBRICATING EYE DROPS OP) Place 2 drops into both eyes 2 (two) times daily as needed (for dry eyes).  . cetirizine (ZYRTEC) 10 MG tablet Take 10 mg by mouth every other day as needed for allergies.   . Cholecalciferol (VITAMIN D-3) 1000 units CAPS Take 1,000 Units by mouth daily.   . diazepam (VALIUM) 5 MG tablet Take 1 tablet (5 mg total) by mouth every 8 (eight) hours as needed (vertigo).  . famotidine (PEPCID) 20 MG tablet TAKE 1 TABLET BY MOUTH TWICE DAILY IN  PLACE  OF  RANITIDINE  . fluticasone (FLONASE) 50 MCG/ACT nasal spray USE 2 SPRAYS IN EACH NOSTRIL EVERY DAY  . furosemide (LASIX) 20 MG tablet TAKE 1 TABLET EVERY DAY  . glipiZIDE (GLUCOTROL XL) 10 MG 24 hr tablet Take 1 tablet (10 mg total) by mouth daily with breakfast.  . Glycopyrrolate-Formoterol (BEVESPI AEROSPHERE) 9-4.8 MCG/ACT AERO Inhale 2 puffs into the lungs at bedtime.  . Insulin Glargine-Lixisenatide (SOLIQUA) 100-33 UNT-MCG/ML SOPN INJECT 10 TO 50  SUBCUTANEOUSLY ONCE DAILY  . Lancets  (ONETOUCH DELICA PLUS [id number]) MISC CHECK GLUCOSE THREE TIMES DAILY  . lisinopril (ZESTRIL) 40 MG tablet TAKE 1 TABLET (40 MG TOTAL) BY MOUTH AT BEDTIME.  . meclizine (ANTIVERT) 25 MG tablet Take 1 tablet (25 mg total) by mouth every 6 (six) hours.  . metFORMIN (GLUCOPHAGE-XR) 750 MG 24 hr tablet TAKE 2 TABLETS BY MOUTH ONCE DAILY WITH BREAKFAST  . Multiple Vitamins-Minerals (MULTIVITAMIN PO) Take 1 tablet by mouth daily.  . Omega-3 1000 MG CAPS Take by mouth.  . pioglitazone (ACTOS) 15 MG tablet TAKE 1 TABLET EVERY DAY  . pregabalin (LYRICA) 75 MG capsule Take 1 capsule (75 mg total) by mouth 2 (two) times daily.  [name] tiZANidine (ZANAFLEX) 2 MG tablet 1 po tid prn  . traMADol (ULTRAM) 50 MG tablet Take 1 tablet by mouth 2 (two) times daily as needed.   . traZODone (DESYREL) 50 MG tablet TAKE 1/2 TO 1 TABLET AT BEDTIME AS NEEDED FOR SLEEP  . Turmeric 500 MG CAPS Take by mouth.  . vitamin B-12 (CYANOCOBALAMIN) 500 MCG tablet Take 1,000 mcg by mouth daily.   No facility-administered encounter medications on file as of 11/18/2020.    Current Diagnosis: Patient Active Problem List   Diagnosis Date Noted  . Bilateral leg edema 05/01/2020  . SOBOE (shortness of breath on exertion) 05/01/2020  . Colon cancer, ascending ([place]) 05/25/2019  . Mild episode of recurrent  major depressive disorder ([place]) 08/01/2018  . SBO (small bowel obstruction) and ileocolonic anastomosis from recent colectomy 05/22/2018  . Cancer of ascending colon - pT1N0 (0/21) s/p lap right colectomy 05/11/2018 05/21/2018  . Hypomagnesemia 05/21/2018  . Ileus following gastrointestinal surgery ([place]) 05/20/2018  . Essential hemorrhagic thrombocythemia ([place]) 03/29/2018  . Hypercalcemia 11/23/2015  . DDD (degenerative disc disease), lumbar 07/28/2015  . Spinal stenosis at L4-L5 level 06/12/2015  . Benign essential HTN 05/25/2015  . Bunion 05/25/2015  . Cardiac enlargement 05/25/2015  . Cervical radicular pain 05/25/2015  .  Back pain, chronic 05/25/2015  . Osteoarthritis 05/25/2015  . Dyslipidemia 05/25/2015  . Gastric reflux 05/25/2015  . Insomnia 05/25/2015  . Eczema intertrigo 05/25/2015  . Bronchitis, chronic, mucopurulent ([place]) 05/25/2015  . Numerous moles 05/25/2015  . Morbid obesity ([place]) 05/25/2015  . Perennial allergic rhinitis 05/25/2015  . DM (diabetes mellitus), type 2, uncontrolled, with renal complications ([place]) 54/65/0354  . Snores 05/25/2015  . History of artificial joint 05/25/2015  . Degeneration of intervertebral disc of cervical region 02/06/2015  . Neuritis or radiculitis due to rupture of lumbar intervertebral disc 01/14/2015  . Abnormal presence of protein in urine 05/04/2010  . Vitamin D deficiency 05/04/2010    Goals Addressed   None     Follow-Up:  Patient Assistance Coordination   Patient stated that she had been approved for patient assistance for [employer] but that [employer] was requesting clarification of information on application from provider. Called to clarify. Angela Adam is a change in medication started by [facility]. [place] needs complete complete prescription including strength, dose, sig. For medication. Fax requested to be sent to office for Dr Ancil Boozer to sign of on medication. Extended hold of 46 min for customer service representative.  While on the phone with patient about Charolotte Eke needs she stated that [place] was $110 a month. Application done to be printed and mailed to patient for patient assistance.

## 2020-11-19 DIAGNOSIS — M4306 Spondylolysis, lumbar region: Secondary | ICD-10-CM | POA: Diagnosis not present

## 2020-11-19 DIAGNOSIS — M9901 Segmental and somatic dysfunction of cervical region: Secondary | ICD-10-CM | POA: Diagnosis not present

## 2020-11-19 DIAGNOSIS — M9903 Segmental and somatic dysfunction of lumbar region: Secondary | ICD-10-CM | POA: Diagnosis not present

## 2020-11-19 DIAGNOSIS — M542 Cervicalgia: Secondary | ICD-10-CM | POA: Diagnosis not present

## 2020-11-20 DIAGNOSIS — M4306 Spondylolysis, lumbar region: Secondary | ICD-10-CM | POA: Diagnosis not present

## 2020-11-20 DIAGNOSIS — M9901 Segmental and somatic dysfunction of cervical region: Secondary | ICD-10-CM | POA: Diagnosis not present

## 2020-11-20 DIAGNOSIS — M542 Cervicalgia: Secondary | ICD-10-CM | POA: Diagnosis not present

## 2020-11-20 DIAGNOSIS — M9903 Segmental and somatic dysfunction of lumbar region: Secondary | ICD-10-CM | POA: Diagnosis not present

## 2020-11-24 DIAGNOSIS — M9903 Segmental and somatic dysfunction of lumbar region: Secondary | ICD-10-CM | POA: Diagnosis not present

## 2020-11-24 DIAGNOSIS — M4306 Spondylolysis, lumbar region: Secondary | ICD-10-CM | POA: Diagnosis not present

## 2020-11-24 DIAGNOSIS — M9901 Segmental and somatic dysfunction of cervical region: Secondary | ICD-10-CM | POA: Diagnosis not present

## 2020-11-24 DIAGNOSIS — M542 Cervicalgia: Secondary | ICD-10-CM | POA: Diagnosis not present

## 2020-11-26 DIAGNOSIS — M542 Cervicalgia: Secondary | ICD-10-CM | POA: Diagnosis not present

## 2020-11-26 DIAGNOSIS — M9901 Segmental and somatic dysfunction of cervical region: Secondary | ICD-10-CM | POA: Diagnosis not present

## 2020-11-26 DIAGNOSIS — M9903 Segmental and somatic dysfunction of lumbar region: Secondary | ICD-10-CM | POA: Diagnosis not present

## 2020-11-26 DIAGNOSIS — M4306 Spondylolysis, lumbar region: Secondary | ICD-10-CM | POA: Diagnosis not present

## 2020-11-27 DIAGNOSIS — M4306 Spondylolysis, lumbar region: Secondary | ICD-10-CM | POA: Diagnosis not present

## 2020-11-27 DIAGNOSIS — M9901 Segmental and somatic dysfunction of cervical region: Secondary | ICD-10-CM | POA: Diagnosis not present

## 2020-11-27 DIAGNOSIS — M542 Cervicalgia: Secondary | ICD-10-CM | POA: Diagnosis not present

## 2020-11-27 DIAGNOSIS — M9903 Segmental and somatic dysfunction of lumbar region: Secondary | ICD-10-CM | POA: Diagnosis not present

## 2020-12-02 DIAGNOSIS — M9901 Segmental and somatic dysfunction of cervical region: Secondary | ICD-10-CM | POA: Diagnosis not present

## 2020-12-02 DIAGNOSIS — M542 Cervicalgia: Secondary | ICD-10-CM | POA: Diagnosis not present

## 2020-12-02 DIAGNOSIS — M9903 Segmental and somatic dysfunction of lumbar region: Secondary | ICD-10-CM | POA: Diagnosis not present

## 2020-12-02 DIAGNOSIS — M4306 Spondylolysis, lumbar region: Secondary | ICD-10-CM | POA: Diagnosis not present

## 2020-12-04 DIAGNOSIS — M9901 Segmental and somatic dysfunction of cervical region: Secondary | ICD-10-CM | POA: Diagnosis not present

## 2020-12-04 DIAGNOSIS — M542 Cervicalgia: Secondary | ICD-10-CM | POA: Diagnosis not present

## 2020-12-04 DIAGNOSIS — M9903 Segmental and somatic dysfunction of lumbar region: Secondary | ICD-10-CM | POA: Diagnosis not present

## 2020-12-04 DIAGNOSIS — M4306 Spondylolysis, lumbar region: Secondary | ICD-10-CM | POA: Diagnosis not present

## 2020-12-09 DIAGNOSIS — M4306 Spondylolysis, lumbar region: Secondary | ICD-10-CM | POA: Diagnosis not present

## 2020-12-09 DIAGNOSIS — M9903 Segmental and somatic dysfunction of lumbar region: Secondary | ICD-10-CM | POA: Diagnosis not present

## 2020-12-09 DIAGNOSIS — M9901 Segmental and somatic dysfunction of cervical region: Secondary | ICD-10-CM | POA: Diagnosis not present

## 2020-12-09 DIAGNOSIS — M542 Cervicalgia: Secondary | ICD-10-CM | POA: Diagnosis not present
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
## 2020-12-10 ENCOUNTER — Encounter: Payer: Self-pay | Admitting: Family Medicine

## 2020-12-10 ENCOUNTER — Ambulatory Visit (INDEPENDENT_AMBULATORY_CARE_PROVIDER_SITE_OTHER): Payer: Medicare HMO | Admitting: Family Medicine

## 2020-12-10 ENCOUNTER — Other Ambulatory Visit: Payer: Self-pay

## 2020-12-10 ENCOUNTER — Telehealth: Payer: Self-pay

## 2020-12-10 VITALS — BP 138/82 | HR 76 | Temp 98.1°F | Resp 18 | Ht 64.0 in | Wt 237.7 lb

## 2020-12-10 DIAGNOSIS — F325 Major depressive disorder, single episode, in full remission: Secondary | ICD-10-CM

## 2020-12-10 DIAGNOSIS — E785 Hyperlipidemia, unspecified: Secondary | ICD-10-CM | POA: Diagnosis not present

## 2020-12-10 DIAGNOSIS — IMO0002 Reserved for concepts with insufficient information to code with codable children: Secondary | ICD-10-CM

## 2020-12-10 DIAGNOSIS — E1169 Type 2 diabetes mellitus with other specified complication: Secondary | ICD-10-CM | POA: Diagnosis not present

## 2020-12-10 DIAGNOSIS — E1129 Type 2 diabetes mellitus with other diabetic kidney complication: Secondary | ICD-10-CM

## 2020-12-10 DIAGNOSIS — E559 Vitamin D deficiency, unspecified: Secondary | ICD-10-CM

## 2020-12-10 DIAGNOSIS — E1121 Type 2 diabetes mellitus with diabetic nephropathy: Secondary | ICD-10-CM

## 2020-12-10 DIAGNOSIS — E1165 Type 2 diabetes mellitus with hyperglycemia: Secondary | ICD-10-CM

## 2020-12-10 DIAGNOSIS — J411 Mucopurulent chronic bronchitis: Secondary | ICD-10-CM

## 2020-12-10 DIAGNOSIS — I10 Essential (primary) hypertension: Secondary | ICD-10-CM | POA: Diagnosis not present

## 2020-12-10 DIAGNOSIS — E538 Deficiency of other specified B group vitamins: Secondary | ICD-10-CM

## 2020-12-10 DIAGNOSIS — E1142 Type 2 diabetes mellitus with diabetic polyneuropathy: Secondary | ICD-10-CM

## 2020-12-10 LAB — POCT GLYCOSYLATED HEMOGLOBIN (HGB A1C): Hemoglobin A1C: 8.1 % — AB (ref 4.0–5.6)

## 2020-12-10 MED ORDER — METFORMIN HCL ER 750 MG PO TB24: 1500.0000 mg | ORAL_TABLET | Freq: Every day | ORAL | 1 refills | Status: DC

## 2020-12-10 MED ORDER — SOLIQUA 100-33 UNT-MCG/ML ~~LOC~~ SOPN
PEN_INJECTOR | SUBCUTANEOUS | 1 refills | Status: DC
Start: 2020-12-10 — End: 2021-02-19

## 2020-12-10 MED ORDER — LISINOPRIL 40 MG PO TABS: 40.0000 mg | ORAL_TABLET | Freq: Every day | ORAL | 1 refills | Status: DC

## 2020-12-10 MED ORDER — ATORVASTATIN CALCIUM 80 MG PO TABS: 80.0000 mg | ORAL_TABLET | Freq: Every day | ORAL | 1 refills | Status: DC

## 2020-12-10 NOTE — Chronic Care Management (AMB) (Signed)
    Chronic Care Management Pharmacy Assistant   Name: Cindy Griffin  MRN: [id number] DOB: Oct 05, 1946   12/10/2020  Called patient to remind her of an appointment with [name] ,CPP on 12/11/2020 @ 08:15 AM.  Patient is aware to have all Medications and supplements available at time of appointment  [name] CPP Notified  Judithann Sheen, [place] Pharmacist Assistant [phone]

## 2020-12-11 ENCOUNTER — Telehealth: Payer: Self-pay

## 2020-12-11 DIAGNOSIS — M9903 Segmental and somatic dysfunction of lumbar region: Secondary | ICD-10-CM | POA: Diagnosis not present

## 2020-12-11 DIAGNOSIS — M9901 Segmental and somatic dysfunction of cervical region: Secondary | ICD-10-CM | POA: Diagnosis not present

## 2020-12-11 DIAGNOSIS — M542 Cervicalgia: Secondary | ICD-10-CM | POA: Diagnosis not present

## 2020-12-11 DIAGNOSIS — M4306 Spondylolysis, lumbar region: Secondary | ICD-10-CM | POA: Diagnosis not present

## 2020-12-11 LAB — CBC WITH DIFFERENTIAL/PLATELET
Absolute Monocytes: 496 cells/uL (ref 200–950)
Basophils Absolute: 40 cells/uL (ref 0–200)
Basophils Relative: 0.6 %
Eosinophils Absolute: 201 cells/uL (ref 15–500)
Eosinophils Relative: 3 %
HCT: 39.2 % (ref 35.0–45.0)
Hemoglobin: 13.2 g/dL (ref 11.7–15.5)
Lymphs Abs: 1702 cells/uL (ref 850–3900)
MCH: 28.8 pg (ref 27.0–33.0)
MCHC: 33.7 g/dL (ref 32.0–36.0)
MCV: 85.6 fL (ref 80.0–100.0)
MPV: 11 fL (ref 7.5–12.5)
Monocytes Relative: 7.4 %
Neutro Abs: 4261 cells/uL (ref 1500–7800)
Neutrophils Relative %: 63.6 %
Platelets: 344 10*3/uL (ref 140–400)
RBC: 4.58 10*6/uL (ref 3.80–5.10)
RDW: 14.1 % (ref 11.0–15.0)
Total Lymphocyte: 25.4 %
WBC: 6.7 10*3/uL (ref 3.8–10.8)

## 2020-12-11 LAB — LIPID PANEL
Cholesterol: 127 mg/dL (ref <200)
HDL: 56 mg/dL (ref >=50)
LDL Cholesterol (Calc): 52 mg/dL (calc)
Non-HDL Cholesterol (Calc): 71 mg/dL (calc) (ref <130)
Total CHOL/HDL Ratio: 2.3 (calc) (ref <5.0)
Triglycerides: 104 mg/dL (ref <150)

## 2020-12-11 LAB — COMPLETE METABOLIC PANEL WITH GFR
AG Ratio: 1.6 (calc) (ref 1.0–2.5)
ALT: 15 U/L (ref 6–29)
AST: 15 U/L (ref 10–35)
Albumin: 4.6 g/dL (ref 3.6–5.1)
Alkaline phosphatase (APISO): 86 U/L (ref 37–153)
BUN: 15 mg/dL (ref 7–25)
CO2: 28 mmol/L (ref 20–32)
Calcium: 10.3 mg/dL (ref 8.6–10.4)
Chloride: 102 mmol/L (ref 98–110)
Creat: 0.86 mg/dL (ref 0.60–0.93)
GFR, Est African American: 77 mL/min/{1.73_m2} (ref >=60)
GFR, Est Non African American: 67 mL/min/{1.73_m2} (ref >=60)
Globulin: 2.8 g/dL (calc) (ref 1.9–3.7)
Glucose, Bld: 106 mg/dL — ABNORMAL HIGH (ref 65–99)
Potassium: 4.4 mmol/L (ref 3.5–5.3)
Sodium: 141 mmol/L (ref 135–146)
Total Bilirubin: 0.3 mg/dL (ref 0.2–1.2)
Total Protein: 7.4 g/dL (ref 6.1–8.1)

## 2020-12-11 LAB — VITAMIN B12: Vitamin B-12: 1064 pg/mL (ref 200–1100)

## 2020-12-11 LAB — VITAMIN D 25 HYDROXY (VIT D DEFICIENCY, FRACTURES): Vit D, 25-Hydroxy: 30 ng/mL (ref 30–100)

## 2020-12-11 LAB — MICROALBUMIN / CREATININE URINE RATIO
Creatinine, Urine: 12 mg/dL — ABNORMAL LOW (ref 20–275)
Microalb, Ur: <0.2 mg/dL

## 2020-12-16 DIAGNOSIS — M9901 Segmental and somatic dysfunction of cervical region: Secondary | ICD-10-CM | POA: Diagnosis not present

## 2020-12-16 DIAGNOSIS — M4306 Spondylolysis, lumbar region: Secondary | ICD-10-CM | POA: Diagnosis not present

## 2020-12-16 DIAGNOSIS — M9903 Segmental and somatic dysfunction of lumbar region: Secondary | ICD-10-CM | POA: Diagnosis not present

## 2020-12-16 DIAGNOSIS — M542 Cervicalgia: Secondary | ICD-10-CM | POA: Diagnosis not present

## 2020-12-17 ENCOUNTER — Telehealth: Payer: Self-pay

## 2020-12-17 NOTE — Progress Notes (Signed)
Left voice  message to confirmed patient telephone appointment on 12/18/2020 for CCM at 9:00 am with Junius Argyle the Clinical pharmacist.   [place] Pharmacist Assistant [phone]

## 2020-12-18 ENCOUNTER — Ambulatory Visit: Payer: Medicare HMO

## 2020-12-18 DIAGNOSIS — E1165 Type 2 diabetes mellitus with hyperglycemia: Secondary | ICD-10-CM

## 2020-12-18 DIAGNOSIS — M9903 Segmental and somatic dysfunction of lumbar region: Secondary | ICD-10-CM | POA: Diagnosis not present

## 2020-12-18 DIAGNOSIS — M4306 Spondylolysis, lumbar region: Secondary | ICD-10-CM | POA: Diagnosis not present

## 2020-12-18 DIAGNOSIS — I10 Essential (primary) hypertension: Secondary | ICD-10-CM

## 2020-12-18 DIAGNOSIS — IMO0002 Reserved for concepts with insufficient information to code with codable children: Secondary | ICD-10-CM

## 2020-12-18 DIAGNOSIS — E1169 Type 2 diabetes mellitus with other specified complication: Secondary | ICD-10-CM

## 2020-12-18 DIAGNOSIS — M542 Cervicalgia: Secondary | ICD-10-CM | POA: Diagnosis not present

## 2020-12-18 DIAGNOSIS — M9901 Segmental and somatic dysfunction of cervical region: Secondary | ICD-10-CM | POA: Diagnosis not present

## 2020-12-18 NOTE — Chronic Care Management (AMB) (Signed)
Chronic Care Management Pharmacy  Name: Cindy Griffin  MRN: [id number] DOB: 1945/12/08  Chief Complaint/ HPI   Cindy Griffin,  75 y.o. , female presents for their Follow-Up CCM visit with the clinical pharmacist via telephone.  PCP : Steele Sizer, MD  Patient Care Team: Steele Sizer, MD as PCP - General (Family Medicine) Sharlet Salina, MD as Consulting Physician (Physical Medicine and Rehabilitation) Merlene Morse, MD as Consulting Physician (Orthopedic Surgery) Ileana Roup, MD as Consulting Physician (General Surgery) Germaine Pomfret, [facility] as Pharmacist (Pharmacist) Corey Skains, MD as Consulting Physician (Cardiology) Erby Pian, MD as Referring Physician (Pulmonary Disease)  Their chronic conditions include: Hypertension, Hyperlipidemia, Diabetes, COPD, Depression, Anxiety, and Chronic Pain, Insomnia and Chronic Bronchitis  Office Visits: 12/10/20: Patient presented to Dr. Ancil Boozer for follow-up. A1c improved to 8.1%. Glipizide stopped.   Consult Visit: 9/13 DDD, Ch[place], BP 139/79 P 71 Wt 238.5 BMI 40.9, lower back, neck pain, cervical fusion 1994, Lyrica/Cymbalta hallucinations, Flexeril not tolerated, tramadol moderate relief  Medications: Outpatient Encounter Medications as of 12/18/2020  Medication Sig Note  . acetaminophen (TYLENOL) 500 MG tablet Take 1,000 mg by mouth every 6 (six) hours as needed for moderate pain or headache.    . albuterol (VENTOLIN HFA) 108 (90 Base) MCG/ACT inhaler Inhale 2 puffs into the lungs every 6 (six) hours as needed for wheezing or shortness of breath.   . Ascorbic Acid (VITAMIN C) 1000 MG tablet Take 1,000 mg by mouth daily.   [name] aspirin EC 81 MG tablet Take 1 tablet (81 mg total) by mouth daily.   [name] atorvastatin (LIPITOR) 80 MG tablet Take 1 tablet (80 mg total) by mouth daily.   . BD PEN NEEDLE MICRO U/F 32G X 6 MM MISC 1 each by Other route daily.   . Biotin 5000 MCG CAPS Take by mouth.   .  Carboxymethylcellul-Glycerin (LUBRICATING EYE DROPS OP) Place 2 drops into both eyes 2 (two) times daily as needed (for dry eyes).   . cetirizine (ZYRTEC) 10 MG tablet Take 10 mg by mouth every other day as needed for allergies.    . Cholecalciferol (VITAMIN D-3) 1000 units CAPS Take 1,000 Units by mouth daily.    . diazepam (VALIUM) 5 MG tablet Take 1 tablet (5 mg total) by mouth every 8 (eight) hours as needed (vertigo). 12/10/2020: PRN  . famotidine (PEPCID) 20 MG tablet TAKE 1 TABLET BY MOUTH TWICE DAILY IN  PLACE  OF  RANITIDINE   . fluticasone (FLONASE) 50 MCG/ACT nasal spray USE 2 SPRAYS IN EACH NOSTRIL EVERY DAY   . furosemide (LASIX) 20 MG tablet TAKE 1 TABLET EVERY DAY   . Glycopyrrolate-Formoterol (BEVESPI AEROSPHERE) 9-4.8 MCG/ACT AERO Inhale 2 puffs into the lungs at bedtime.   . Insulin Glargine-Lixisenatide (SOLIQUA) 100-33 UNT-MCG/ML SOPN INJECT 10 TO 50  SUBCUTANEOUSLY ONCE DAILY   . Lancets (ONETOUCH DELICA PLUS [id number]) MISC CHECK GLUCOSE THREE TIMES DAILY   . lisinopril (ZESTRIL) 40 MG tablet Take 1 tablet (40 mg total) by mouth daily.   . meclizine (ANTIVERT) 25 MG tablet Take 1 tablet (25 mg total) by mouth every 6 (six) hours.   . metFORMIN (GLUCOPHAGE-XR) 750 MG 24 hr tablet Take 2 tablets (1,500 mg total) by mouth daily with breakfast.   . Multiple Vitamins-Minerals (MULTIVITAMIN PO) Take 1 tablet by mouth daily.   . Omega-3 1000 MG CAPS Take by mouth.   . pioglitazone (ACTOS) 15 MG tablet TAKE 1 TABLET EVERY DAY   .  pregabalin (LYRICA) 75 MG capsule Take 1 capsule (75 mg total) by mouth 2 (two) times daily.   [name] tiZANidine (ZANAFLEX) 2 MG tablet 1 po tid prn   . traMADol (ULTRAM) 50 MG tablet Take 1 tablet by mouth 2 (two) times daily as needed.    . traZODone (DESYREL) 50 MG tablet TAKE 1/2 TO 1 TABLET AT BEDTIME AS NEEDED FOR SLEEP   . Turmeric 500 MG CAPS Take by mouth.   . vitamin B-12 (CYANOCOBALAMIN) 500 MCG tablet Take 1,000 mcg by mouth daily.    No  facility-administered encounter medications on file as of 12/18/2020.   Current Diagnosis/Assessment:  SDOH Interventions   Flowsheet Row Most Recent Value  SDOH Interventions   Financial Strain Interventions Other (Comment)  [[facility] PAP is in progress]  Transportation Interventions Intervention Not Indicated       Goals Addressed            This Visit's Progress   . Chronic Care Management       CARE PLAN ENTRY (see longitudinal plan of care for additional care plan information)  Current Barriers:  . Chronic Disease Management support, education, and care coordination needs related to Hypertension, Hyperlipidemia, Diabetes, COPD, Depression, Anxiety, and Chronic Pain, Insomnia and Chronic Bronchitis   Hypertension BP Readings from Last 3 Encounters:  04/08/20 (!) 142/70  03/12/20 116/60  12/21/19 129/77   . Pharmacist Clinical Goal(s): o Over the next 90 days, patient will work with PharmD and providers to maintain BP goal <140/90 . Current regimen:  o Lisinopril 80m daily o Furosemide 20 mg daily  . Interventions: o None . Patient self care activities - Over the next 90 days, patient will: o Check BP weekly, document, and provide at future appointments o Ensure daily salt intake < 2300 mg/day  Hyperlipidemia Lab Results  Component Value Date/Time   LDLCALC 43 11/12/2019 12:00 AM   . Pharmacist Clinical Goal(s): o Over the next 90 days, patient will work with PharmD and providers to maintain LDL goal < 70 . Current regimen:  o Atorvastatin 812mdaily . Interventions: o None . Patient self care activities - Over the next 90 days, patient will: o Continue to take atorvastatin 8028maily o Report any unexplained muscle pain to PharmD or provider  Diabetes Lab Results  Component Value Date/Time   HGBA1C 9.8 (A) 03/12/2020 01:20 PM   HGBA1C 8.4 (A) 11/12/2019 02:49 PM   HGBA1C 9.3 (A) 07/03/2019 09:19 AM   HGBA1C 6.9 03/01/2019 10:05 AM   . Pharmacist  Clinical Goal(s): o Over the next 90 days, patient will work with PharmD and providers to achieve A1c goal <7% . Current regimen:  o Soliqua 45 units daily  o Actos 17m65mily o Metformin XR 750 mg 2 tablets daily . Interventions: o None . Patient self care activities - Over the next 90 days, patient will: o Check blood sugar twice daily, document, and provide at future appointments o Contact provider with any episodes of hypoglycemia  Medication management . Pharmacist Clinical Goal(s): o Over the next 90 days, patient will work with PharmD and providers to maintain optimal medication adherence . Current pharmacy: [facility] . Interventions o Comprehensive medication review performed. o Continue current medication management strategy . Patient self care activities - Over the next 90 days, patient will: o Focus on medication adherence by monitoring copay amounts o Take medications as prescribed o Report any questions, affordability issues or concerns to PharmD and/or provider(s)  Hypertension   BP goal is:  <140/90  Office blood pressures are  BP Readings from Last 3 Encounters:  12/10/20 138/82  10/07/20 (!) 151/83  09/25/20 (!) 146/83   Patient checks BP at home infrequently Patient home BP readings are ranging: NA  Patient has failed these meds in the past: NA Patient is currently controlled on the following medications:  . Lisinopril 40 mg daily  . Furosemide 20 mg daily   We discussed diet and exercise extensively. Reviewed proper BP monitoring technique and goals of therapy.   Plan  Continue current medications  Increase monitoring to 1-2 times weekly   Hyperlipidemia   LDL goal < 70  Last lipids Lab Results  Component Value Date   CHOL 127 12/10/2020   HDL 56 12/10/2020   LDLCALC 52 12/10/2020   TRIG 104 12/10/2020   CHOLHDL 2.3 12/10/2020   Hepatic Function Latest Ref Rng & Units 12/10/2020 06/23/2020 12/21/2019  Total Protein 6.1 - 8.1 g/dL 7.4  7.4 7.3  Albumin 3.5 - 5.0 g/dL - 4.0 4.2  AST 10 - 35 U/L _0 ALT 6 - 29 U/L _1 Alk Phosphatase 38 - 126 U/L - 76 77  Total Bilirubin 0.2 - 1.2 mg/dL 0.3 0.5 0.6     The ASCVD Risk score ([place]., et al., 2013) failed to calculate for the following reasons:   The valid total cholesterol range is 130 to 320 mg/dL   Patient has failed these meds in past: NA Patient is currently controlled on the following medications:  . Aspirin 81 mg daily  . Atorvastatin 80 mg daily   We discussed:  diet and exercise extensively. Patient stable, denies myalgias.   Plan  Continue current medications  Diabetes   A1c goal <8%  Recent Relevant Labs: Lab Results  Component Value Date/Time   HGBA1C 8.1 (A) 12/10/2020 09:23 AM   HGBA1C 9.7 (A) 09/03/2020 02:03 PM   HGBA1C 9.3 (A) 07/03/2019 09:19 AM   HGBA1C 6.9 03/01/2019 10:05 AM   FRUCTOSAMINE 264 12/27/2017 09:56 AM   MICROALBUR <0.2 12/10/2020 12:00 AM   MICROALBUR 0.6 11/12/2019 12:00 AM   MICROALBUR 50 07/22/2017 09:00 AM   MICROALBUR 50 07/15/2016 10:52 AM    Last diabetic Eye exam:  Lab Results  Component Value Date/Time   HMDIABEYEEXA No Retinopathy 10/08/2020 12:00 AM    Last diabetic Foot exam: No results found for: HMDIABFOOTEX   Checking BG: Daily   Fasting Post-Prandial  19-Jan 91   15-Jan 112   14-Jan 135   13-Jan    3-Jan  112  2-Jan  154  1-Jan 147   Average 121 133   Patient has failed these meds in past: NA Patient is currently controlled on the following medications: . Soliqua 45 units daily  . Metformin XR 750 mg 2 tablets daily with breakfast  . Pioglitazone 15 mg daily   We discussed: congratulated patient on success in A1c improvement. She attributes her success to lifestyle changes. She has cut back on bread and sweets. Not drinking sodas. Walking as much as possible (3x weekly). She mainly drinks water throughout the day, but will have Glucerna shakes 1-2 times daily.   She does  report one instance of hypoglycemia symptoms when she didn't have breakfast and she felt jittery. Her blood sugar was 91 at the time, and improved with eating food.   Patient never received the application for [facility].   Plan  Continue current  medications Will plan to print PAP materials and have patient come into clinic to sign.   Chronic Bronchitis   Eosinophil count:   Lab Results  Component Value Date/Time   EOSPCT 3.0 12/10/2020 12:00 AM  %                               Eos (Absolute):  Lab Results  Component Value Date/Time   EOSABS 201 12/10/2020 12:00 AM   EOSABS 0.2 11/21/2015 11:25 AM    Tobacco Status:  Social History   Tobacco Use  Smoking Status Never Smoker  Smokeless Tobacco Never Used  Tobacco Comment   smoking cessation materials not required    Patient has failed these meds in past: Bevespi Patient is currently uncontrolled on the following medications:  medications: . Ventolin HFA 108 mcg/act 2 puff q6hr PRN  . Bevespi 9-4.87mg/act 2 puff QHS    Using maintenance inhaler regularly? Yes Frequency of rescue inhaler use:  multiple times per day  We discussed: Bevespi PAP approved through 2022.    Plan  Continue current medications  Medication Management   Pt uses [facility] for all medications Uses pill box? Yes Pt endorses 100% compliance  We discussed:   Plan  Continue current medication management strategy  Follow up: 6 month phone visit  [phone]

## 2020-12-18 NOTE — Patient Instructions (Signed)
Visit Information It was great speaking with you today!  Please let me know if you have any questions about our visit. Goals Addressed            This Visit's Progress   . Chronic Care Management       CARE PLAN ENTRY (see longitudinal plan of care for additional care plan information)  Current Barriers:  . Chronic Disease Management support, education, and care coordination needs related to Hypertension, Hyperlipidemia, Diabetes, COPD, Depression, Anxiety, and Chronic Pain, Insomnia and Chronic Bronchitis   Hypertension BP Readings from Last 3 Encounters:  04/08/20 (!) 142/70  03/12/20 116/60  12/21/19 129/77   . Pharmacist Clinical Goal(s): o Over the next 90 days, patient will work with PharmD and providers to maintain BP goal <140/90 . Current regimen:  o Lisinopril 40mg  daily o Furosemide 20 mg daily  . Interventions: o None . Patient self care activities - Over the next 90 days, patient will: o Check BP weekly, document, and provide at future appointments o Ensure daily salt intake < 2300 mg/day  Hyperlipidemia Lab Results  Component Value Date/Time   LDLCALC 43 11/12/2019 12:00 AM   . Pharmacist Clinical Goal(s): o Over the next 90 days, patient will work with PharmD and providers to maintain LDL goal < 70 . Current regimen:  o Atorvastatin 80mg  daily . Interventions: o None . Patient self care activities - Over the next 90 days, patient will: o Continue to take atorvastatin 80mg  daily o Report any unexplained muscle pain to PharmD or provider  Diabetes Lab Results  Component Value Date/Time   HGBA1C 9.8 (A) 03/12/2020 01:20 PM   HGBA1C 8.4 (A) 11/12/2019 02:49 PM   HGBA1C 9.3 (A) 07/03/2019 09:19 AM   HGBA1C 6.9 03/01/2019 10:05 AM   . Pharmacist Clinical Goal(s): o Over the next 90 days, patient will work with PharmD and providers to achieve A1c goal <7% . Current regimen:  o Soliqua 45 units daily  o Actos 15mg  daily o Metformin XR 750 mg 2  tablets daily . Interventions: o None . Patient self care activities - Over the next 90 days, patient will: o Check blood sugar twice daily, document, and provide at future appointments o Contact provider with any episodes of hypoglycemia  Medication management . Pharmacist Clinical Goal(s): o Over the next 90 days, patient will work with PharmD and providers to maintain optimal medication adherence . Current pharmacy: [facility] . Interventions o Comprehensive medication review performed. o Continue current medication management strategy . Patient self care activities - Over the next 90 days, patient will: o Focus on medication adherence by monitoring copay amounts o Take medications as prescribed o Report any questions, affordability issues or concerns to PharmD and/or provider(s)       The patient verbalized understanding of instructions, educational materials, and care plan provided today and declined offer to receive copy of patient instructions, educational materials, and care plan.   Telephone follow up appointment with pharmacy team member scheduled for: 06/18/21 at 9:00 AM  [facility] [phone]

## 2020-12-19 ENCOUNTER — Telehealth: Payer: Self-pay

## 2020-12-19 NOTE — Chronic Care Management (AMB) (Signed)
Chronic Care Management Pharmacy Assistant   Name: Cindy Griffin  MRN: [id number] DOB: 1946/05/03  Reason for Encounter: Patient Assistance Coordination  PCP : Cindy Sizer, MD   12/19/2020- Called patient to follow up on call with Cindy Griffin looking for [facility] patient assistance forms. Patient aware that [facility], CPA worked on forms and placed in the mail to patient. But patient states she has never received. Patient aware we can get additional form together and will have at her PCP office for her to sign. Patient like this plan, patient aware that Cindy Griffin, CPP is there at PCP office a few days this week. He will be there for clinic on Wednesday 12/24/2020. Patient will come to the office then to sign. [facility] patient assistance form filled out from [employer] Patient assistance program and sent to Cindy Griffin, CPP to print out for patient.   Allergies:  No Known Allergies  Medications: Outpatient Encounter Medications as of 12/19/2020  Medication Sig Note   acetaminophen (TYLENOL) 500 MG tablet Take 1,000 mg by mouth every 6 (six) hours as needed for moderate pain or headache.     albuterol (VENTOLIN HFA) 108 (90 Base) MCG/ACT inhaler Inhale 2 puffs into the lungs every 6 (six) hours as needed for wheezing or shortness of breath.    Ascorbic Acid (VITAMIN C) 1000 MG tablet Take 1,000 mg by mouth daily.    aspirin EC 81 MG tablet Take 1 tablet (81 mg total) by mouth daily.    atorvastatin (LIPITOR) 80 MG tablet Take 1 tablet (80 mg total) by mouth daily.    BD PEN NEEDLE MICRO U/F 32G X 6 MM MISC 1 each by Other route daily.    Biotin 5000 MCG CAPS Take by mouth.    Carboxymethylcellul-Glycerin (LUBRICATING EYE DROPS OP) Place 2 drops into both eyes 2 (two) times daily as needed (for dry eyes).    cetirizine (ZYRTEC) 10 MG tablet Take 10 mg by mouth every other day as needed for allergies.     Cholecalciferol (VITAMIN D-3) 1000 units CAPS Take 1,000 Units by mouth  daily.     diazepam (VALIUM) 5 MG tablet Take 1 tablet (5 mg total) by mouth every 8 (eight) hours as needed (vertigo). 12/10/2020: PRN   famotidine (PEPCID) 20 MG tablet TAKE 1 TABLET BY MOUTH TWICE DAILY IN  PLACE  OF  RANITIDINE    fluticasone (FLONASE) 50 MCG/ACT nasal spray USE 2 SPRAYS IN EACH NOSTRIL EVERY DAY    furosemide (LASIX) 20 MG tablet TAKE 1 TABLET EVERY DAY    Glycopyrrolate-Formoterol (BEVESPI AEROSPHERE) 9-4.8 MCG/ACT AERO Inhale 2 puffs into the lungs at bedtime.    Insulin Glargine-Lixisenatide (SOLIQUA) 100-33 UNT-MCG/ML SOPN INJECT 10 TO 50  SUBCUTANEOUSLY ONCE DAILY    Lancets (ONETOUCH DELICA PLUS [id number]) MISC CHECK GLUCOSE THREE TIMES DAILY    lisinopril (ZESTRIL) 40 MG tablet Take 1 tablet (40 mg total) by mouth daily.    meclizine (ANTIVERT) 25 MG tablet Take 1 tablet (25 mg total) by mouth every 6 (six) hours.    metFORMIN (GLUCOPHAGE-XR) 750 MG 24 hr tablet Take 2 tablets (1,500 mg total) by mouth daily with breakfast.    Multiple Vitamins-Minerals (MULTIVITAMIN PO) Take 1 tablet by mouth daily.    Omega-3 1000 MG CAPS Take by mouth.    pioglitazone (ACTOS) 15 MG tablet TAKE 1 TABLET EVERY DAY    pregabalin (LYRICA) 75 MG capsule Take 1 capsule (75 mg total) by mouth 2 (two)  times daily.    tiZANidine (ZANAFLEX) 2 MG tablet 1 po tid prn    traMADol (ULTRAM) 50 MG tablet Take 1 tablet by mouth 2 (two) times daily as needed.     traZODone (DESYREL) 50 MG tablet TAKE 1/2 TO 1 TABLET AT BEDTIME AS NEEDED FOR SLEEP    Turmeric 500 MG CAPS Take by mouth.    vitamin B-12 (CYANOCOBALAMIN) 500 MCG tablet Take 1,000 mcg by mouth daily.    No facility-administered encounter medications on file as of 12/19/2020.    Current Diagnosis: Patient Active Problem List   Diagnosis Date Noted   Bilateral leg edema 05/01/2020   SOBOE (shortness of breath on exertion) 05/01/2020   Colon cancer, ascending ([place]) 05/25/2019   Mild episode of recurrent major  depressive disorder ([place]) 08/01/2018   SBO (small bowel obstruction) and ileocolonic anastomosis from recent colectomy 05/22/2018   Cancer of ascending colon - pT1N0 (0/21) s/p lap right colectomy 05/11/2018 05/21/2018   Hypomagnesemia 05/21/2018   Ileus following gastrointestinal surgery ([place]) 05/20/2018   Essential hemorrhagic thrombocythemia ([place]) 03/29/2018   Hypercalcemia 11/23/2015   DDD (degenerative disc disease), lumbar 07/28/2015   Spinal stenosis at L4-L5 level 06/12/2015   Benign essential HTN 05/25/2015   Bunion 05/25/2015   Cardiac enlargement 05/25/2015   Cervical radicular pain 05/25/2015   Back pain, chronic 05/25/2015   Osteoarthritis 05/25/2015   Dyslipidemia 05/25/2015   Gastric reflux 05/25/2015   Insomnia 05/25/2015   Eczema intertrigo 05/25/2015   Bronchitis, chronic, mucopurulent ([place]) 05/25/2015   Numerous moles 05/25/2015   Morbid obesity ([place]) 05/25/2015   Perennial allergic rhinitis 05/25/2015   DM (diabetes mellitus), type 2, uncontrolled, with renal complications ([place]) 75/08/2584   Snores 05/25/2015   History of artificial joint 05/25/2015   Degeneration of intervertebral disc of cervical region 02/06/2015   Neuritis or radiculitis due to rupture of lumbar intervertebral disc 01/14/2015   Abnormal presence of protein in urine 05/04/2010   Vitamin D deficiency 05/04/2010     Follow-Up:  Patient Cindy Griffin, Cindy Griffin Pharmacist Assistant [phone]

## 2020-12-22 ENCOUNTER — Other Ambulatory Visit: Payer: Self-pay | Admitting: Family Medicine

## 2020-12-22 DIAGNOSIS — E1121 Type 2 diabetes mellitus with diabetic nephropathy: Secondary | ICD-10-CM

## 2020-12-22 DIAGNOSIS — E1142 Type 2 diabetes mellitus with diabetic polyneuropathy: Secondary | ICD-10-CM

## 2020-12-22 DIAGNOSIS — IMO0002 Reserved for concepts with insufficient information to code with codable children: Secondary | ICD-10-CM

## 2020-12-22 DIAGNOSIS — I10 Essential (primary) hypertension: Secondary | ICD-10-CM

## 2020-12-22 DIAGNOSIS — E1165 Type 2 diabetes mellitus with hyperglycemia: Secondary | ICD-10-CM

## 2020-12-23 DIAGNOSIS — I10 Essential (primary) hypertension: Secondary | ICD-10-CM | POA: Diagnosis not present

## 2020-12-23 DIAGNOSIS — M542 Cervicalgia: Secondary | ICD-10-CM | POA: Diagnosis not present

## 2020-12-23 DIAGNOSIS — M4306 Spondylolysis, lumbar region: Secondary | ICD-10-CM | POA: Diagnosis not present

## 2020-12-23 DIAGNOSIS — M9903 Segmental and somatic dysfunction of lumbar region: Secondary | ICD-10-CM | POA: Diagnosis not present

## 2020-12-23 DIAGNOSIS — E782 Mixed hyperlipidemia: Secondary | ICD-10-CM | POA: Diagnosis not present

## 2020-12-23 DIAGNOSIS — R600 Localized edema: Secondary | ICD-10-CM | POA: Diagnosis not present

## 2020-12-23 DIAGNOSIS — M9901 Segmental and somatic dysfunction of cervical region: Secondary | ICD-10-CM | POA: Diagnosis not present

## 2020-12-24 ENCOUNTER — Inpatient Hospital Stay: Payer: Medicare HMO | Attending: Internal Medicine

## 2020-12-24 DIAGNOSIS — I10 Essential (primary) hypertension: Secondary | ICD-10-CM | POA: Insufficient documentation

## 2020-12-24 DIAGNOSIS — J449 Chronic obstructive pulmonary disease, unspecified: Secondary | ICD-10-CM | POA: Insufficient documentation

## 2020-12-24 DIAGNOSIS — C182 Malignant neoplasm of ascending colon: Secondary | ICD-10-CM | POA: Diagnosis not present

## 2020-12-24 DIAGNOSIS — M1990 Unspecified osteoarthritis, unspecified site: Secondary | ICD-10-CM | POA: Insufficient documentation

## 2020-12-24 DIAGNOSIS — K219 Gastro-esophageal reflux disease without esophagitis: Secondary | ICD-10-CM | POA: Diagnosis not present

## 2020-12-24 DIAGNOSIS — Z7982 Long term (current) use of aspirin: Secondary | ICD-10-CM | POA: Insufficient documentation

## 2020-12-24 DIAGNOSIS — E785 Hyperlipidemia, unspecified: Secondary | ICD-10-CM | POA: Diagnosis not present

## 2020-12-24 DIAGNOSIS — Z794 Long term (current) use of insulin: Secondary | ICD-10-CM | POA: Diagnosis not present

## 2020-12-24 DIAGNOSIS — Z7984 Long term (current) use of oral hypoglycemic drugs: Secondary | ICD-10-CM | POA: Insufficient documentation

## 2020-12-24 DIAGNOSIS — G4700 Insomnia, unspecified: Secondary | ICD-10-CM | POA: Diagnosis not present

## 2020-12-24 DIAGNOSIS — Z79899 Other long term (current) drug therapy: Secondary | ICD-10-CM | POA: Diagnosis not present

## 2020-12-24 DIAGNOSIS — E1165 Type 2 diabetes mellitus with hyperglycemia: Secondary | ICD-10-CM | POA: Insufficient documentation

## 2020-12-24 DIAGNOSIS — R970 Elevated carcinoembryonic antigen [CEA]: Secondary | ICD-10-CM | POA: Insufficient documentation

## 2020-12-25 DIAGNOSIS — M9903 Segmental and somatic dysfunction of lumbar region: Secondary | ICD-10-CM | POA: Diagnosis not present

## 2020-12-25 DIAGNOSIS — M4306 Spondylolysis, lumbar region: Secondary | ICD-10-CM | POA: Diagnosis not present

## 2020-12-25 DIAGNOSIS — M542 Cervicalgia: Secondary | ICD-10-CM | POA: Diagnosis not present

## 2020-12-25 DIAGNOSIS — M9901 Segmental and somatic dysfunction of cervical region: Secondary | ICD-10-CM | POA: Diagnosis not present

## 2020-12-25 LAB — CEA: CEA: 5.8 ng/mL — ABNORMAL HIGH (ref 0.0–4.7)

## 2020-12-26 ENCOUNTER — Inpatient Hospital Stay: Payer: Medicare HMO | Admitting: Internal Medicine

## 2020-12-26 ENCOUNTER — Encounter: Payer: Self-pay | Admitting: Internal Medicine

## 2020-12-26 VITALS — BP 139/79 | HR 74 | Temp 97.0°F | Resp 16 | Ht 64.0 in | Wt 239.0 lb

## 2020-12-26 DIAGNOSIS — I10 Essential (primary) hypertension: Secondary | ICD-10-CM | POA: Diagnosis not present

## 2020-12-26 DIAGNOSIS — G4700 Insomnia, unspecified: Secondary | ICD-10-CM | POA: Diagnosis not present

## 2020-12-26 DIAGNOSIS — C182 Malignant neoplasm of ascending colon: Secondary | ICD-10-CM

## 2020-12-26 DIAGNOSIS — K219 Gastro-esophageal reflux disease without esophagitis: Secondary | ICD-10-CM | POA: Diagnosis not present

## 2020-12-26 DIAGNOSIS — R970 Elevated carcinoembryonic antigen [CEA]: Secondary | ICD-10-CM | POA: Diagnosis not present

## 2020-12-26 DIAGNOSIS — J449 Chronic obstructive pulmonary disease, unspecified: Secondary | ICD-10-CM | POA: Diagnosis not present

## 2020-12-26 DIAGNOSIS — E1165 Type 2 diabetes mellitus with hyperglycemia: Secondary | ICD-10-CM | POA: Diagnosis not present

## 2020-12-26 DIAGNOSIS — M1990 Unspecified osteoarthritis, unspecified site: Secondary | ICD-10-CM | POA: Diagnosis not present

## 2020-12-26 DIAGNOSIS — E785 Hyperlipidemia, unspecified: Secondary | ICD-10-CM | POA: Diagnosis not present
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
## 2020-12-26 NOTE — Progress Notes (Signed)
Has no energy.

## 2020-12-26 NOTE — Assessment & Plan Note (Signed)
#  Colon cancer-of ascending colon- stage I; MSI mismatch/sporadic- Excellent prognosis more than 90% chance of cure. January 2021 pelvis CT scan -no evidence of recurrence.  Continue colonoscopies as per GI [? 2023 Jan].  STABLE.   #Abnormal CEA-around 5-7; overall -STABLE.  Low clinical suspicion of any recurrent malignancy.  Monitor for now.  Hold off imaging at this time.   # Fatigue: ? Sec poorly controlled DM-HbA1c- 8.7- pt has been trying to exercise. Declines- sleep study.   # DISPOSITION:  # follow up in 6 months-MD; labs- CEA 2 days prior Dr.B

## 2020-12-30 DIAGNOSIS — M542 Cervicalgia: Secondary | ICD-10-CM | POA: Diagnosis not present

## 2020-12-30 DIAGNOSIS — M4306 Spondylolysis, lumbar region: Secondary | ICD-10-CM | POA: Diagnosis not present

## 2020-12-30 DIAGNOSIS — M9901 Segmental and somatic dysfunction of cervical region: Secondary | ICD-10-CM | POA: Diagnosis not present

## 2020-12-30 DIAGNOSIS — M9903 Segmental and somatic dysfunction of lumbar region: Secondary | ICD-10-CM | POA: Diagnosis not present

## 2021-01-01 ENCOUNTER — Ambulatory Visit (INDEPENDENT_AMBULATORY_CARE_PROVIDER_SITE_OTHER): Payer: Medicare HMO

## 2021-01-01 DIAGNOSIS — E1165 Type 2 diabetes mellitus with hyperglycemia: Secondary | ICD-10-CM

## 2021-01-01 DIAGNOSIS — E1129 Type 2 diabetes mellitus with other diabetic kidney complication: Secondary | ICD-10-CM

## 2021-01-01 DIAGNOSIS — M4306 Spondylolysis, lumbar region: Secondary | ICD-10-CM | POA: Diagnosis not present

## 2021-01-01 DIAGNOSIS — M542 Cervicalgia: Secondary | ICD-10-CM | POA: Diagnosis not present

## 2021-01-01 DIAGNOSIS — M9903 Segmental and somatic dysfunction of lumbar region: Secondary | ICD-10-CM | POA: Diagnosis not present

## 2021-01-01 DIAGNOSIS — IMO0002 Reserved for concepts with insufficient information to code with codable children: Secondary | ICD-10-CM

## 2021-01-01 DIAGNOSIS — M9901 Segmental and somatic dysfunction of cervical region: Secondary | ICD-10-CM | POA: Diagnosis not present

## 2021-01-01 NOTE — Chronic Care Management (AMB) (Signed)
[employer] Patient Assistance form for [place] completed by patient and faxed for review on 01/01/21   [facility] [phone]

## 2021-01-06 DIAGNOSIS — M9901 Segmental and somatic dysfunction of cervical region: Secondary | ICD-10-CM | POA: Diagnosis not present

## 2021-01-06 DIAGNOSIS — M4306 Spondylolysis, lumbar region: Secondary | ICD-10-CM | POA: Diagnosis not present

## 2021-01-06 DIAGNOSIS — M542 Cervicalgia: Secondary | ICD-10-CM | POA: Diagnosis not present

## 2021-01-06 DIAGNOSIS — M9903 Segmental and somatic dysfunction of lumbar region: Secondary | ICD-10-CM | POA: Diagnosis not present

## 2021-01-08 DIAGNOSIS — M542 Cervicalgia: Secondary | ICD-10-CM | POA: Diagnosis not present

## 2021-01-08 DIAGNOSIS — M9903 Segmental and somatic dysfunction of lumbar region: Secondary | ICD-10-CM | POA: Diagnosis not present

## 2021-01-08 DIAGNOSIS — M4306 Spondylolysis, lumbar region: Secondary | ICD-10-CM | POA: Diagnosis not present

## 2021-01-08 DIAGNOSIS — M9901 Segmental and somatic dysfunction of cervical region: Secondary | ICD-10-CM | POA: Diagnosis not present

## 2021-01-13 DIAGNOSIS — M9901 Segmental and somatic dysfunction of cervical region: Secondary | ICD-10-CM | POA: Diagnosis not present

## 2021-01-13 DIAGNOSIS — M4306 Spondylolysis, lumbar region: Secondary | ICD-10-CM | POA: Diagnosis not present

## 2021-01-13 DIAGNOSIS — M9903 Segmental and somatic dysfunction of lumbar region: Secondary | ICD-10-CM | POA: Diagnosis not present

## 2021-01-13 DIAGNOSIS — M542 Cervicalgia: Secondary | ICD-10-CM | POA: Diagnosis not present

## 2021-01-15 DIAGNOSIS — M9901 Segmental and somatic dysfunction of cervical region: Secondary | ICD-10-CM | POA: Diagnosis not present

## 2021-01-15 DIAGNOSIS — M4306 Spondylolysis, lumbar region: Secondary | ICD-10-CM | POA: Diagnosis not present

## 2021-01-15 DIAGNOSIS — M542 Cervicalgia: Secondary | ICD-10-CM | POA: Diagnosis not present

## 2021-01-15 DIAGNOSIS — M9903 Segmental and somatic dysfunction of lumbar region: Secondary | ICD-10-CM | POA: Diagnosis not present

## 2021-01-16 ENCOUNTER — Telehealth: Payer: Self-pay

## 2021-01-16 DIAGNOSIS — T25222A Burn of second degree of left foot, initial encounter: Secondary | ICD-10-CM | POA: Diagnosis not present
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
## 2021-01-21 DIAGNOSIS — M9903 Segmental and somatic dysfunction of lumbar region: Secondary | ICD-10-CM | POA: Diagnosis not present

## 2021-01-21 DIAGNOSIS — M542 Cervicalgia: Secondary | ICD-10-CM | POA: Diagnosis not present

## 2021-01-21 DIAGNOSIS — M9901 Segmental and somatic dysfunction of cervical region: Secondary | ICD-10-CM | POA: Diagnosis not present

## 2021-01-21 DIAGNOSIS — M4306 Spondylolysis, lumbar region: Secondary | ICD-10-CM | POA: Diagnosis not present

## 2021-01-28 ENCOUNTER — Ambulatory Visit: Payer: Self-pay

## 2021-01-28 DIAGNOSIS — M9903 Segmental and somatic dysfunction of lumbar region: Secondary | ICD-10-CM | POA: Diagnosis not present

## 2021-01-28 DIAGNOSIS — IMO0002 Reserved for concepts with insufficient information to code with codable children: Secondary | ICD-10-CM

## 2021-01-28 DIAGNOSIS — E1129 Type 2 diabetes mellitus with other diabetic kidney complication: Secondary | ICD-10-CM

## 2021-01-28 DIAGNOSIS — M542 Cervicalgia: Secondary | ICD-10-CM | POA: Diagnosis not present

## 2021-01-28 DIAGNOSIS — M4306 Spondylolysis, lumbar region: Secondary | ICD-10-CM | POA: Diagnosis not present

## 2021-01-28 DIAGNOSIS — M9901 Segmental and somatic dysfunction of cervical region: Secondary | ICD-10-CM | POA: Diagnosis not present

## 2021-01-28 DIAGNOSIS — J411 Mucopurulent chronic bronchitis: Secondary | ICD-10-CM

## 2021-01-28 NOTE — Progress Notes (Signed)
[employer] Patient Assistance form for [place] completed by patient and faxed for review on 01/28/21.   Rejection letter received from [employer] for [place], but no rejection reason was listed. Will contact [employer] to investigate reason for denial.   [facility] [phone]

## 2021-01-29 ENCOUNTER — Telehealth: Payer: Self-pay

## 2021-01-29 NOTE — Progress Notes (Signed)
Chronic Care Management Pharmacy Assistant   Name: Cindy Griffin  MRN: [id number] DOB: Oct 01, 1946  Reason for Encounter:Diabetes  Disease State Call.  Patient Questions:  1.  Have you seen any other providers since your last visit? No  2.  Any changes in your medicines or health? No    PCP : Steele Sizer, MD  Allergies:  No Known Allergies  Medications: Outpatient Encounter Medications as of 01/29/2021  Medication Sig Note  . acetaminophen (TYLENOL) 500 MG tablet Take 1,000 mg by mouth every 6 (six) hours as needed for moderate pain or headache.    . albuterol (VENTOLIN HFA) 108 (90 Base) MCG/ACT inhaler Inhale 2 puffs into the lungs every 6 (six) hours as needed for wheezing or shortness of breath.   . Ascorbic Acid (VITAMIN C) 1000 MG tablet Take 1,000 mg by mouth daily.   [name] aspirin EC 81 MG tablet Take 1 tablet (81 mg total) by mouth daily.   [name] atorvastatin (LIPITOR) 80 MG tablet Take 1 tablet (80 mg total) by mouth daily.   . BD PEN NEEDLE MICRO U/F 32G X 6 MM MISC 1 each by Other route daily.   . Biotin 5000 MCG CAPS Take by mouth.   . Carboxymethylcellul-Glycerin (LUBRICATING EYE DROPS OP) Place 2 drops into both eyes 2 (two) times daily as needed (for dry eyes).   . carvedilol (COREG) 3.125 MG tablet Take by mouth.   . cetirizine (ZYRTEC) 10 MG tablet Take 10 mg by mouth every other day as needed for allergies.    . Cholecalciferol (VITAMIN D-3) 1000 units CAPS Take 1,000 Units by mouth daily.    . diazepam (VALIUM) 5 MG tablet Take 1 tablet (5 mg total) by mouth every 8 (eight) hours as needed (vertigo). 12/10/2020: PRN  . famotidine (PEPCID) 20 MG tablet TAKE 1 TABLET BY MOUTH TWICE DAILY IN  PLACE  OF  RANITIDINE   . fluticasone (FLONASE) 50 MCG/ACT nasal spray USE 2 SPRAYS IN EACH NOSTRIL EVERY DAY   . furosemide (LASIX) 20 MG tablet TAKE 1 TABLET EVERY DAY   . Glycopyrrolate-Formoterol (BEVESPI AEROSPHERE) 9-4.8 MCG/ACT AERO Inhale 2 puffs into the lungs at bedtime.    . Insulin Glargine-Lixisenatide (SOLIQUA) 100-33 UNT-MCG/ML SOPN INJECT 10 TO 50  SUBCUTANEOUSLY ONCE DAILY   . Lancets (ONETOUCH DELICA PLUS [id number]) MISC CHECK GLUCOSE THREE TIMES DAILY   . lisinopril (ZESTRIL) 40 MG tablet TAKE 1 TABLET (40 MG TOTAL) BY MOUTH AT BEDTIME.   . meclizine (ANTIVERT) 25 MG tablet Take 1 tablet (25 mg total) by mouth every 6 (six) hours.   . metFORMIN (GLUCOPHAGE-XR) 750 MG 24 hr tablet TAKE 2 TABLETS BY MOUTH ONCE DAILY WITH BREAKFAST   . Multiple Vitamins-Minerals (MULTIVITAMIN PO) Take 1 tablet by mouth daily.   . Omega-3 1000 MG CAPS Take by mouth.   . pioglitazone (ACTOS) 15 MG tablet TAKE 1 TABLET EVERY DAY   . pregabalin (LYRICA) 75 MG capsule Take 1 capsule (75 mg total) by mouth 2 (two) times daily.   [name] tiZANidine (ZANAFLEX) 2 MG tablet 1 po tid prn   . traMADol (ULTRAM) 50 MG tablet Take 1 tablet by mouth 2 (two) times daily as needed.    . traZODone (DESYREL) 50 MG tablet TAKE 1/2 TO 1 TABLET AT BEDTIME AS NEEDED FOR SLEEP   . Turmeric 500 MG CAPS Take by mouth.   . vitamin B-12 (CYANOCOBALAMIN) 500 MCG tablet Take 1,000 mcg by mouth daily.  No facility-administered encounter medications on file as of 01/29/2021.    Current Diagnosis: Patient Active Problem List   Diagnosis Date Noted  . Bilateral leg edema 05/01/2020  . SOBOE (shortness of breath on exertion) 05/01/2020  . Colon cancer, ascending ([place]) 05/25/2019  . Mild episode of recurrent major depressive disorder ([place]) 08/01/2018  . SBO (small bowel obstruction) and ileocolonic anastomosis from recent colectomy 05/22/2018  . Cancer of ascending colon - pT1N0 (0/21) s/p lap right colectomy 05/11/2018 05/21/2018  . Hypomagnesemia 05/21/2018  . Ileus following gastrointestinal surgery ([place]) 05/20/2018  . Essential hemorrhagic thrombocythemia ([place]) 03/29/2018  . Hypercalcemia 11/23/2015  . DDD (degenerative disc disease), lumbar 07/28/2015  . Spinal stenosis at L4-L5 level 06/12/2015  .  Benign essential HTN 05/25/2015  . Bunion 05/25/2015  . Cardiac enlargement 05/25/2015  . Cervical radicular pain 05/25/2015  . Back pain, chronic 05/25/2015  . Osteoarthritis 05/25/2015  . Dyslipidemia 05/25/2015  . Gastric reflux 05/25/2015  . Insomnia 05/25/2015  . Eczema intertrigo 05/25/2015  . Bronchitis, chronic, mucopurulent ([place]) 05/25/2015  . Numerous moles 05/25/2015  . Morbid obesity ([place]) 05/25/2015  . Perennial allergic rhinitis 05/25/2015  . DM (diabetes mellitus), type 2, uncontrolled, with renal complications ([place]) 32/95/1884  . Snores 05/25/2015  . History of artificial joint 05/25/2015  . Degeneration of intervertebral disc of cervical region 02/06/2015  . Neuritis or radiculitis due to rupture of lumbar intervertebral disc 01/14/2015  . Abnormal presence of protein in urine 05/04/2010  . Vitamin D deficiency 05/04/2010    Goals Addressed   None      Recent Relevant Labs: Lab Results  Component Value Date/Time   HGBA1C 8.1 (A) 12/10/2020 09:23 AM   HGBA1C 9.7 (A) 09/03/2020 02:03 PM   HGBA1C 9.3 (A) 07/03/2019 09:19 AM   HGBA1C 6.9 03/01/2019 10:05 AM   MICROALBUR <0.2 12/10/2020 12:00 AM   MICROALBUR 0.6 11/12/2019 12:00 AM   MICROALBUR 50 07/22/2017 09:00 AM   MICROALBUR 50 07/15/2016 10:52 AM    Kidney Function Lab Results  Component Value Date/Time   CREATININE 0.86 12/10/2020 12:00 AM   CREATININE 0.85 09/09/2020 01:08 PM   CREATININE 0.98 06/23/2020 01:26 PM   CREATININE 0.76 11/12/2019 12:00 AM   GFRNONAA 67 12/10/2020 12:00 AM   GFRAA 77 12/10/2020 12:00 AM    . Current antihyperglycemic regimen:  ? Soliqua 45 units daily  ? Actos 15mg  daily ? Metformin XR 750 mg 2 tablets daily . What recent interventions/DTPs have been made to improve glycemic control:  o None ID . Have there been any recent hospitalizations or ED visits since last visit with CPP? No . Patient reports hypoglycemic symptoms, including Pale, Sweaty and Shaky   o Patient states one morning last week, she had not eaten but she took her medications.Patient went to the bathroom slid down and was unable to get up.Patient reports she was in her bathroom floor from 8:00 am to 11:00 a.m. Patient states she could not function, "it was like I was in a tunnel , I was afraid I was going to died ".Patient states she did not have her cell phone on her at the time.Patient states her daughter helped her. Patient ate banana nut muffins and a tablespoon of peanut butter which help with symptoms.  . Patient reports hyperglycemic symptoms, including weakness . How often are you checking your blood sugar? once daily . What are your blood sugars ranging?  o Fasting: N/A o Before meals:  - On 01/29/2021 it was 117. -  Patient is unsure of the dates: 70,82,68,77. o After meals:  - Patient is unsure of the dates:162-(Patients reports eating bread pudding), 825,189 o Bedtime: N/A . During the week, how often does your blood glucose drop below 70? Patient states her blood sugars has been  70,68,77 this past week. . Are you checking your feet daily/regularly?   Patient reports have neuropathy in her hands and feet. Patient states "my hands are the worst, so much numbness".   Adherence Review: Is the patient currently on a STATIN medication? Yes Is the patient currently on ACE/ARB medication? Yes Does the patient have >5 day gap between last estimated fill dates? Yes  Spoke with [employer] to see why patient was denied for patient assistance for soliqua.Per [employer], patient is currently enrolled in  low subsidy level 4 and was denied because she is receiving  low subsidy.  Inform patient she was denied, and patient states that's "okay because it only cost $45.00 now with the help from low subsidy".  Follow-Up:  Pharmacist Review  Bessie Orlovista Pharmacist Assistant [phone]

## 2021-02-04 DIAGNOSIS — M9901 Segmental and somatic dysfunction of cervical region: Secondary | ICD-10-CM | POA: Diagnosis not present

## 2021-02-04 DIAGNOSIS — M4306 Spondylolysis, lumbar region: Secondary | ICD-10-CM | POA: Diagnosis not present

## 2021-02-04 DIAGNOSIS — M542 Cervicalgia: Secondary | ICD-10-CM | POA: Diagnosis not present

## 2021-02-04 DIAGNOSIS — M9903 Segmental and somatic dysfunction of lumbar region: Secondary | ICD-10-CM | POA: Diagnosis not present

## 2021-02-05 ENCOUNTER — Ambulatory Visit (INDEPENDENT_AMBULATORY_CARE_PROVIDER_SITE_OTHER): Payer: Medicare HMO

## 2021-02-05 DIAGNOSIS — E1165 Type 2 diabetes mellitus with hyperglycemia: Secondary | ICD-10-CM

## 2021-02-05 DIAGNOSIS — E1159 Type 2 diabetes mellitus with other circulatory complications: Secondary | ICD-10-CM | POA: Diagnosis not present

## 2021-02-05 DIAGNOSIS — E1129 Type 2 diabetes mellitus with other diabetic kidney complication: Secondary | ICD-10-CM | POA: Diagnosis not present

## 2021-02-05 DIAGNOSIS — I152 Hypertension secondary to endocrine disorders: Secondary | ICD-10-CM | POA: Diagnosis not present

## 2021-02-05 DIAGNOSIS — IMO0002 Reserved for concepts with insufficient information to code with codable children: Secondary | ICD-10-CM

## 2021-02-05 NOTE — Patient Instructions (Signed)
Visit Information It was great speaking with you today!  Please let me know if you have any questions about our visit.  Goals Addressed            This Visit's Progress   . Monitor and Manage My Blood Sugar-Diabetes Type 2       Timeframe:  Long-Range Goal Priority:  High Start Date:  02/05/2021                           Expected End Date:  08/08/2021                     Follow Up Date 02/19/2021    -Check blood sugar daily before breakfast - check blood sugar if I feel it is too high or too low - enter blood sugar readings and medication or insulin into daily log - take the blood sugar log to all doctor visits    Why is this important?    Checking your blood sugar at home helps to keep it from getting very high or very low.   Writing the results in a diary or log helps the doctor know how to care for you.   Your blood sugar log should have the time, date and the results.   Also, write down the amount of insulin or other medicine that you take.   Other information, like what you ate, exercise done and how you were feeling, will also be helpful.     Notes:        Patient Care Plan: General Pharmacy (Adult)    Problem Identified: Hypertension, Hyperlipidemia, Diabetes, COPD, Depression, Anxiety and Chronic Pain, Insomnia   Priority: High    Long-Range Goal: Patient-Specific Goal   Start Date: 02/05/2021  Expected End Date: 08/08/2021  This Visit's Progress: On track  Priority: High  Note:   Current Barriers:  . Unable to independently afford treatment regimen . Unable to achieve control of Diabetes   Pharmacist Clinical Goal(s):  [name] Over the next 90 days, patient will achieve control of diabetes as evidenced by A1c less than 8% through collaboration with PharmD and provider.   Interventions: . 1:1 collaboration with Steele Sizer, MD regarding development and update of comprehensive plan of care as evidenced by provider attestation and  co-signature . Inter-disciplinary care team collaboration (see longitudinal plan of care) . Comprehensive medication review performed; medication list updated in electronic medical record  Diabetes (A1c goal <8%) -Controlled -Current medications: . Pioglitazone 15 mg daily  . Soliqua 50 units at breakfast . Metformin XR 750 mg 2 tablets daily  -Medications previously tried: NA  -Current home glucose readings . fasting glucose: 135, 115, 117,  . post prandial glucose: NA -Reports hypoglycemic symptoms: felt pale, sweaty, shaky. Treated with banana nut muffin + spoonful of peanut butter.  -Skipping breakfast some mornings -Current exercise: Patient does get routine exercise -Educated on Prevention and management of hypoglycemic episodes; Benefits of routine self-monitoring of blood sugar; -Counseled to check feet daily and get yearly eye exams -Recommended decreasing Soliqua to 40 units daily  Patient Goals/Self-Care Activities . Over the next 90 days, patient will:  - check glucose daily before breakfast, document, and provide at future appointments  Follow Up Plan: Telephone follow up appointment with care management team member scheduled for: 03/11/2021 at 1:00 PM      Patient agreed to services and verbal consent obtained.   The patient verbalized understanding of  instructions, educational materials, and care plan provided today and declined offer to receive copy of patient instructions, educational materials, and care plan.   [facility] [phone]

## 2021-02-05 NOTE — Progress Notes (Signed)
Chronic Care Management Pharmacy Note  02/05/2021 Name:  Cindy Griffin MRN:  [id number] DOB:  27-Jun-1946  Subjective: Cindy Griffin is an 75 y.o. year old female who is a primary patient of Steele Sizer, MD.  The CCM team was consulted for assistance with disease management and care coordination needs.    Engaged with patient by telephone for follow up visit in response to provider referral for pharmacy case management and/or care coordination services.   Consent to Services:  The patient was given information about Chronic Care Management services, agreed to services, and gave verbal consent prior to initiation of services.  Please see initial visit note for detailed documentation.   Patient Care Team: Steele Sizer, MD as PCP - General (Family Medicine) Sharlet Salina, MD as Consulting Physician (Physical Medicine and Rehabilitation) Merlene Morse, MD as Consulting Physician (Orthopedic Surgery) Ileana Roup, MD as Consulting Physician (General Surgery) Germaine Pomfret, [facility] as Pharmacist (Pharmacist) Corey Skains, MD as Consulting Physician (Cardiology) Erby Pian, MD as Referring Physician (Pulmonary Disease) Cammie Sickle, MD as Consulting Physician (Hematology and Oncology)  Recent office visits: 12/10/20: Patient presented to Dr. Ancil Boozer for follow-up. A1c improved to 8.1%. Glipizide stopped.   Recent consult visits: None in previous 6 months  Hospital visits: None in previous 6 months  Objective:  Lab Results  Component Value Date   CREATININE 0.86 12/10/2020   BUN 15 12/10/2020   GFRNONAA 67 12/10/2020   GFRAA 77 12/10/2020   NA 141 12/10/2020   K 4.4 12/10/2020   CALCIUM 10.3 12/10/2020   CO2 28 12/10/2020    Lab Results  Component Value Date/Time   HGBA1C 8.1 (A) 12/10/2020 09:23 AM   HGBA1C 9.7 (A) 09/03/2020 02:03 PM   HGBA1C 9.3 (A) 07/03/2019 09:19 AM   HGBA1C 6.9 03/01/2019 10:05 AM   FRUCTOSAMINE 264  12/27/2017 09:56 AM   MICROALBUR <0.2 12/10/2020 12:00 AM   MICROALBUR 0.6 11/12/2019 12:00 AM   MICROALBUR 50 07/22/2017 09:00 AM   MICROALBUR 50 07/15/2016 10:52 AM    Last diabetic Eye exam:  Lab Results  Component Value Date/Time   HMDIABEYEEXA No Retinopathy 10/08/2020 12:00 AM    Last diabetic Foot exam: No results found for: HMDIABFOOTEX   Lab Results  Component Value Date   CHOL 127 12/10/2020   HDL 56 12/10/2020   LDLCALC 52 12/10/2020   TRIG 104 12/10/2020   CHOLHDL 2.3 12/10/2020    Hepatic Function Latest Ref Rng & Units 12/10/2020 06/23/2020 12/21/2019  Total Protein 6.1 - 8.1 g/dL 7.4 7.4 7.3  Albumin 3.5 - 5.0 g/dL - 4.0 4.2  AST 10 - 35 U/L 15 22 22   ALT 6 - 29 U/L 15 18 20   Alk Phosphatase 38 - 126 U/L - 76 77  Total Bilirubin 0.2 - 1.2 mg/dL 0.3 0.5 0.6    No results found for: TSH, FREET4  CBC Latest Ref Rng & Units 12/10/2020 09/09/2020 06/23/2020  WBC 3.8 - 10.8 Thousand/uL 6.7 6.7 7.3  Hemoglobin 11.7 - 15.5 g/dL 13.2 12.3 11.8(L)  Hematocrit 35.0 - 45.0 % 39.2 38.8 36.0  Platelets 140 - 400 Thousand/uL 344 292 282    Lab Results  Component Value Date/Time   VD25OH 30 12/10/2020 12:00 AM   VD25OH 51 10/31/2018 12:06 PM    Clinical ASCVD: No  The ASCVD Risk score Mikey Bussing DC Jr., et al., 2013) failed to calculate for the following reasons:   The valid total cholesterol range is  130 to 320 mg/dL    Depression screen [facility] 2/9 12/10/2020 07/15/2020 05/29/2020  Decreased Interest 0 0 0  Down, Depressed, Hopeless 0 0 0  PHQ - 2 Score 0 0 0  Altered sleeping 1 - 0  Tired, decreased energy 1 - 0  Change in appetite 0 - 0  Feeling bad or failure about yourself  1 - 0  Trouble concentrating 1 - 0  Moving slowly or fidgety/restless 0 - 0  Suicidal thoughts 0 - 0  PHQ-9 Score 4 - 0  Difficult doing work/chores Somewhat difficult - -  Some recent data might be hidden    Social History   Tobacco Use  Smoking Status Never Smoker  Smokeless Tobacco Never  Used  Tobacco Comment   smoking cessation materials not required   BP Readings from Last 3 Encounters:  12/26/20 139/79  12/10/20 138/82  10/07/20 (!) 151/83   Pulse Readings from Last 3 Encounters:  12/26/20 74  12/10/20 76  10/07/20 72   Wt Readings from Last 3 Encounters:  12/26/20 239 lb (108.4 kg)  12/10/20 237 lb 11.2 oz (107.8 kg)  10/07/20 237 lb (107.5 kg)    Assessment/Interventions: Review of patient past medical history, allergies, medications, health status, including review of consultants reports, laboratory and other test data, was performed as part of comprehensive evaluation and provision of chronic care management services.   SDOH:  (Social Determinants of Health) assessments and interventions performed: Yes SDOH Interventions   Flowsheet Row Most Recent Value  SDOH Interventions   Financial Strain Interventions Other (Comment)  [PAP]      CCM Care Plan  No Known Allergies  Medications Reviewed Today    Reviewed by Germaine Pomfret, [place] (Pharmacist) on 02/05/21 at 1357  Med List Status: <None>  Medication Order Taking? Sig Documenting Provider Last Dose Status Informant  acetaminophen (TYLENOL) 500 MG tablet [id number]  Take 1,000 mg by mouth every 6 (six) hours as needed for moderate pain or headache.  [provider]  Active Self  albuterol (VENTOLIN HFA) 108 (90 Base) MCG/ACT inhaler [id number]  Inhale 2 puffs into the lungs every 6 (six) hours as needed for wheezing or shortness of breath. Steele Sizer, MD  Active   Ascorbic Acid (VITAMIN C) 1000 MG tablet [id number]  Take 1,000 mg by mouth daily. [provider]  Active Self  aspirin EC 81 MG tablet [id number]  Take 1 tablet (81 mg total) by mouth daily. Steele Sizer, MD  Active Self  atorvastatin (LIPITOR) 80 MG tablet [id number]  Take 1 tablet (80 mg total) by mouth daily. Steele Sizer, MD  Active   BD PEN NEEDLE MICRO U/F 32G X 6 MM MISC [id number]  1 each by Other route  daily. Steele Sizer, MD  Active   Biotin 5000 MCG CAPS [id number]  Take by mouth. [provider]  Active Self  Carboxymethylcellul-Glycerin (LUBRICATING EYE DROPS OP) [id number]  Place 2 drops into both eyes 2 (two) times daily as needed (for dry eyes). [provider]  Active Self  carvedilol (COREG) 3.125 MG tablet [id number]  Take by mouth. [provider]  Active   cetirizine (ZYRTEC) 10 MG tablet [id number]  Take 10 mg by mouth every other day as needed for allergies.  [provider]  Active Self           Med Note Larwance Sachs A   Thu May 06, 2016 12:00 PM)    Cholecalciferol (VITAMIN D-3) 1000 units CAPS [id number]  Take 1,000 Units by mouth daily.  [provider]  Active Self  diazepam (VALIUM) 5 MG tablet [id number]  Take 1 tablet (5 mg total) by mouth every 8 (eight) hours as needed (vertigo). Nance Pear, MD  Active            Med Note (JEFFRIES, Langley Adie Dec 10, 2020  9:07 AM) PRN  famotidine (PEPCID) 20 MG tablet [id number]  TAKE 1 TABLET BY MOUTH TWICE DAILY IN  PLACE  OF  RANITIDINE Steele Sizer, MD  Active   fluticasone [facility]) 50 MCG/ACT nasal spray [id number]  USE 2 SPRAYS IN EACH NOSTRIL EVERY Eloise Harman, MD  Active   furosemide (LASIX) 20 MG tablet [id number]  TAKE 1 TABLET EVERY DAY Sowles, Drue Stager, MD  Active   Glycopyrrolate-Formoterol (BEVESPI AEROSPHERE) 9-4.8 MCG/ACT Hollie Salk [id number]  Inhale 2 puffs into the lungs at bedtime. Steele Sizer, MD  Active   Insulin Glargine-Lixisenatide [facility]) 100-33 UNT-MCG/ML Bon[place] Aid [id number] Yes INJECT 10 TO 50  SUBCUTANEOUSLY ONCE DAILY  Patient taking differently: Inject 50 Units into the skin daily with breakfast. INJECT 10 TO 50  SUBCUTANEOUSLY ONCE DAILY   Steele Sizer, MD Taking Active   Lancets (ONETOUCH DELICA PLUS [id number]) MISC [id number]  CHECK GLUCOSE THREE TIMES DAILY [provider]  Active   lisinopril (ZESTRIL) 40 MG tablet [id number]  TAKE 1  TABLET (40 MG TOTAL) BY MOUTH AT BEDTIME. Steele Sizer, MD  Active   meclizine (ANTIVERT) 25 MG tablet [id number]  Take 1 tablet (25 mg total) by mouth every 6 (six) hours. Steele Sizer, MD  Active   metFORMIN (GLUCOPHAGE-XR) 750 MG 24 hr tablet [id number] Yes TAKE 2 TABLETS BY MOUTH ONCE DAILY WITH [place], Drue Stager, MD Taking Active   Multiple Vitamins-Minerals (MULTIVITAMIN PO) [id number]  Take 1 tablet by mouth daily. [provider]  Active Self  Omega-3 1000 MG CAPS [id number]  Take by mouth. [provider]  Active   pioglitazone (ACTOS) 15 MG tablet [id number] Yes TAKE 1 TABLET EVERY DAY Sowles, Drue Stager, MD Taking Active   pregabalin (LYRICA) 75 MG capsule [id number]  Take 1 capsule (75 mg total) by mouth 2 (two) times daily. Steele Sizer, MD  Active   tiZANidine (ZANAFLEX) 2 MG tablet [id number]  1 po tid prn [provider]  Active   traMADol (ULTRAM) 50 MG tablet [id number]  Take 1 tablet by mouth 2 (two) times daily as needed.  Sharlet Salina, MD  Active   traZODone (DESYREL) 50 MG tablet [id number]  TAKE 1/2 TO 1 TABLET AT BEDTIME AS NEEDED FOR SLEEP Steele Sizer, MD  Active   Turmeric 500 MG CAPS [id number]  Take by mouth. [provider]  Active Self  vitamin B-12 (CYANOCOBALAMIN) 500 MCG tablet [id number]  Take 1,000 mcg by mouth daily. [provider]  Active Self          Patient Active Problem List   Diagnosis Date Noted  . Bilateral leg edema 05/01/2020  . SOBOE (shortness of breath on exertion) 05/01/2020  . Colon cancer, ascending ([place]) 05/25/2019  . Mild episode of recurrent major depressive disorder ([place]) 08/01/2018  . SBO (small bowel obstruction) and ileocolonic anastomosis from recent colectomy 05/22/2018  . Cancer of ascending colon - pT1N0 (0/21) s/p lap right colectomy 05/11/2018 05/21/2018  . Hypomagnesemia 05/21/2018  . Ileus following gastrointestinal surgery ([place]) 05/20/2018  . Essential hemorrhagic  thrombocythemia ([place]) 03/29/2018  . Hypercalcemia 11/23/2015  . DDD (degenerative disc disease), lumbar 07/28/2015  .  Spinal stenosis at L4-L5 level 06/12/2015  . Benign essential HTN 05/25/2015  . Bunion 05/25/2015  . Cardiac enlargement 05/25/2015  . Cervical radicular pain 05/25/2015  . Back pain, chronic 05/25/2015  . Osteoarthritis 05/25/2015  . Dyslipidemia 05/25/2015  . Gastric reflux 05/25/2015  . Insomnia 05/25/2015  . Eczema intertrigo 05/25/2015  . Bronchitis, chronic, mucopurulent ([place]) 05/25/2015  . Numerous moles 05/25/2015  . Morbid obesity ([place]) 05/25/2015  . Perennial allergic rhinitis 05/25/2015  . DM (diabetes mellitus), type 2, uncontrolled, with renal complications ([place]) 93/73/4287  . Snores 05/25/2015  . History of artificial joint 05/25/2015  . Degeneration of intervertebral disc of cervical region 02/06/2015  . Neuritis or radiculitis due to rupture of lumbar intervertebral disc 01/14/2015  . Abnormal presence of protein in urine 05/04/2010  . Vitamin D deficiency 05/04/2010    Immunization History  Administered Date(s) Administered  . Fluad Quad(high Dose 65+) 11/12/2019, 09/03/2020  . Influenza, High Dose Seasonal PF 08/29/2015, 08/25/2016, 08/01/2018  . Influenza, Seasonal, Injecte, Preservative Fre 08/03/2011, 08/21/2012  . Influenza,inj,Quad PF,6+ Mos 08/13/2013, 07/17/2014  . Influenza-Unspecified 07/17/2014, 08/31/2017  . PFIZER(Purple Top)SARS-COV-2 Vaccination 01/08/2020, 01/29/2020, 08/27/2020  . PPD Test 10/08/2015  . Pneumococcal Conjugate-13 07/17/2014  . Pneumococcal Polysaccharide-23 05/04/2010, 08/29/2015  . Tdap 05/04/2010  . Zoster 12/25/2010  . Zoster Recombinat (Shingrix) 09/18/2018, 11/27/2018    Conditions to be addressed/monitored:  Hypertension, Hyperlipidemia, Diabetes, COPD, Depression, Anxiety and Chronic Pain, Insomnia  Care Plan : General Pharmacy (Adult)  Updates made by Germaine Pomfret, RPH since 02/05/2021 12:00  AM    Problem: Hypertension, Hyperlipidemia, Diabetes, COPD, Depression, Anxiety and Chronic Pain, Insomnia   Priority: High    Long-Range Goal: Patient-Specific Goal   Start Date: 02/05/2021  Expected End Date: 08/08/2021  This Visit's Progress: On track  Priority: High  Note:   Current Barriers:  . Unable to independently afford treatment regimen . Unable to achieve control of Diabetes   Pharmacist Clinical Goal(s):  [name] Over the next 90 days, patient will achieve control of diabetes as evidenced by A1c less than 8% through collaboration with PharmD and provider.   Interventions: . 1:1 collaboration with Steele Sizer, MD regarding development and update of comprehensive plan of care as evidenced by provider attestation and co-signature . Inter-disciplinary care team collaboration (see longitudinal plan of care) . Comprehensive medication review performed; medication list updated in electronic medical record  Diabetes (A1c goal <8%) -Controlled -Current medications: . Pioglitazone 15 mg daily  . Soliqua 50 units at breakfast . Metformin XR 750 mg 2 tablets daily  -Medications previously tried: NA  -Current home glucose readings . fasting glucose: 135, 115, 117,  . post prandial glucose: NA -Reports hypoglycemic symptoms: felt pale, sweaty, shaky. Treated with banana nut muffin + spoonful of peanut butter.  -Skipping breakfast some mornings -Current exercise: Patient does get routine exercise -Educated on Prevention and management of hypoglycemic episodes; Benefits of routine self-monitoring of blood sugar; -Counseled to check feet daily and get yearly eye exams -Recommended decreasing Soliqua to 40 units daily  Patient Goals/Self-Care Activities . Over the next 90 days, patient will:  - check glucose daily before breakfast, document, and provide at future appointments  Follow Up Plan: Telephone follow up appointment with care management team member scheduled for:  03/11/2021 at 1:00 PM      Medication Assistance: Application for Bevespi  medication assistance program. in process.  Anticipated assistance start date 02/12/21.  See plan of care for additional detail.  Patient's preferred pharmacy  is:  [facility], [place] [address] Phone: [phone] Fax: [phone]  [facility] [address] ([address]), [place] - [place]) [place] [address] Phone: [phone] Fax: [phone]  Uses pill box? Yes Pt endorses 100% compliance  We discussed: Current pharmacy is preferred with insurance plan and patient is satisfied with pharmacy services Patient decided to: Continue current medication management strategy  Care Plan and Follow Up Patient Decision:  Patient agrees to Care Plan and Follow-up.  Plan: Telephone follow up appointment with care management team member scheduled for:  03/11/2021 at 1:00 PM  [facility] [phone]

## 2021-02-09 DIAGNOSIS — M5412 Radiculopathy, cervical region: Secondary | ICD-10-CM | POA: Diagnosis not present

## 2021-02-09 DIAGNOSIS — M65342 Trigger finger, left ring finger: Secondary | ICD-10-CM | POA: Diagnosis not present

## 2021-02-09 DIAGNOSIS — M4802 Spinal stenosis, cervical region: Secondary | ICD-10-CM | POA: Diagnosis not present

## 2021-02-09 DIAGNOSIS — M5030 Other cervical disc degeneration, unspecified cervical region: Secondary | ICD-10-CM | POA: Diagnosis not present

## 2021-02-09 DIAGNOSIS — M5416 Radiculopathy, lumbar region: Secondary | ICD-10-CM | POA: Diagnosis not present

## 2021-02-09 DIAGNOSIS — M1611 Unilateral primary osteoarthritis, right hip: Secondary | ICD-10-CM | POA: Diagnosis not present

## 2021-02-09 DIAGNOSIS — M5136 Other intervertebral disc degeneration, lumbar region: Secondary | ICD-10-CM | POA: Diagnosis not present

## 2021-02-09 DIAGNOSIS — M48062 Spinal stenosis, lumbar region with neurogenic claudication: Secondary | ICD-10-CM | POA: Diagnosis not present

## 2021-02-10 DIAGNOSIS — M1611 Unilateral primary osteoarthritis, right hip: Secondary | ICD-10-CM | POA: Diagnosis not present

## 2021-02-13 ENCOUNTER — Other Ambulatory Visit: Payer: Self-pay | Admitting: Family Medicine

## 2021-02-13 NOTE — Telephone Encounter (Signed)
Requested medication (s) are due for refill today:   Not sure  Requested medication (s) are on the active medication list:   Yes  Future visit scheduled:   Yes   Last ordered: 12/23/2019 10.7 g, 2 refills  Returned because no protocol assigned to this medication   Requested Prescriptions  Pending Prescriptions Disp Refills   BEVESPI AEROSPHERE 9-4.8 MCG/ACT AERO [Pharmacy Med Name: Bevespi Aerosphere 9-4.8 MCG/ACT Inhalation Aerosol] 11 g 0    Sig: INHALE 2 PUFFS BY MOUTH AT BEDTIME      Off-Protocol Failed - 02/13/2021  1:01 PM      Failed - Medication not assigned to a protocol, review manually.      Passed - Valid encounter within last 12 months    Recent Outpatient Visits           2 months ago DM (diabetes mellitus), type 2, uncontrolled, with renal complications [facility])   [facility] [place], Drue Stager, MD   5 months ago DM (diabetes mellitus), type 2, uncontrolled, with renal complications [facility])   [facility] [place], Drue Stager, MD   8 months ago DM (diabetes mellitus), type 2, uncontrolled, with renal complications [facility])   [facility] [place], Drue Stager, MD   10 months ago DM (diabetes mellitus), type 2, uncontrolled, with renal complications [facility])   [facility] [place], Drue Stager, MD   11 months ago DM (diabetes mellitus), type 2, uncontrolled, with renal complications [facility])   [facility] Steele Sizer, MD       Future Appointments             In 2 months Ancil Boozer, Drue Stager, MD [facility], [place]   In 5 months  [facility], [facility]

## 2021-02-14 ENCOUNTER — Other Ambulatory Visit: Payer: Self-pay | Admitting: Family Medicine

## 2021-02-14 DIAGNOSIS — K219 Gastro-esophageal reflux disease without esophagitis: Secondary | ICD-10-CM

## 2021-02-18 DIAGNOSIS — M65342 Trigger finger, left ring finger: Secondary | ICD-10-CM | POA: Diagnosis not present

## 2021-02-19 ENCOUNTER — Other Ambulatory Visit: Payer: Self-pay | Admitting: Family Medicine

## 2021-02-25 ENCOUNTER — Telehealth: Payer: Self-pay

## 2021-02-25 NOTE — Progress Notes (Signed)
    Chronic Care Management Pharmacy Assistant   Name: Cindy Griffin  MRN: [id number] DOB: [birth year]   Reason for Encounter: Medication Review   Medications: Outpatient Encounter Medications as of 02/25/2021  Medication Sig Note  . acetaminophen (TYLENOL) 500 MG tablet Take 1,000 mg by mouth every 6 (six) hours as needed for moderate pain or headache.    . albuterol (VENTOLIN HFA) 108 (90 Base) MCG/ACT inhaler Inhale 2 puffs into the lungs every 6 (six) hours as needed for wheezing or shortness of breath.   . Ascorbic Acid (VITAMIN C) 1000 MG tablet Take 1,000 mg by mouth daily.   [name] aspirin EC 81 MG tablet Take 1 tablet (81 mg total) by mouth daily.   [name] atorvastatin (LIPITOR) 80 MG tablet Take 1 tablet (80 mg total) by mouth daily.   . BD PEN NEEDLE MICRO U/F 32G X 6 MM MISC 1 each by Other route daily.   [name] BEVESPI AEROSPHERE 9-4.8 MCG/ACT AERO INHALE 2 PUFFS BY MOUTH AT BEDTIME   . Biotin 5000 MCG CAPS Take by mouth.   . Carboxymethylcellul-Glycerin (LUBRICATING EYE DROPS OP) Place 2 drops into both eyes 2 (two) times daily as needed (for dry eyes).   . carvedilol (COREG) 3.125 MG tablet Take by mouth.   . cetirizine (ZYRTEC) 10 MG tablet Take 10 mg by mouth every other day as needed for allergies.    . Cholecalciferol (VITAMIN D-3) 1000 units CAPS Take 1,000 Units by mouth daily.    . diazepam (VALIUM) 5 MG tablet Take 1 tablet (5 mg total) by mouth every 8 (eight) hours as needed (vertigo). 12/10/2020: PRN  . famotidine (PEPCID) 20 MG tablet TAKE 1 TABLET TWICE DAILY IN PLACE OF RANITIDINE   . fluticasone (FLONASE) 50 MCG/ACT nasal spray USE 2 SPRAYS IN EACH NOSTRIL EVERY DAY   . furosemide (LASIX) 20 MG tablet TAKE 1 TABLET EVERY DAY   . Insulin Glargine-Lixisenatide (SOLIQUA) 100-33 UNT-MCG/ML SOPN INJECT 10 TO 50 UNITS SUBCUTANEOUSLY ONCE DAILY   . Lancets (ONETOUCH DELICA PLUS [id number]) MISC CHECK GLUCOSE THREE TIMES DAILY   . lisinopril (ZESTRIL) 40 MG tablet TAKE 1 TABLET (40  MG TOTAL) BY MOUTH AT BEDTIME.   . meclizine (ANTIVERT) 25 MG tablet Take 1 tablet (25 mg total) by mouth every 6 (six) hours.   . metFORMIN (GLUCOPHAGE-XR) 750 MG 24 hr tablet TAKE 2 TABLETS BY MOUTH ONCE DAILY WITH BREAKFAST   . Multiple Vitamins-Minerals (MULTIVITAMIN PO) Take 1 tablet by mouth daily.   . Omega-3 1000 MG CAPS Take by mouth.   . pioglitazone (ACTOS) 15 MG tablet TAKE 1 TABLET EVERY DAY   . pregabalin (LYRICA) 75 MG capsule Take 1 capsule (75 mg total) by mouth 2 (two) times daily.   [name] tiZANidine (ZANAFLEX) 2 MG tablet 1 po tid prn   . traMADol (ULTRAM) 50 MG tablet Take 1 tablet by mouth 2 (two) times daily as needed.    . traZODone (DESYREL) 50 MG tablet TAKE 1/2 TO 1 TABLET AT BEDTIME AS NEEDED FOR SLEEP   . Turmeric 500 MG CAPS Take by mouth.   . vitamin B-12 (CYANOCOBALAMIN) 500 MCG tablet Take 1,000 mcg by mouth daily.    No facility-administered encounter medications on file as of 02/25/2021.    Reviewed chart and adherence measures. Per insurance data patient is 90-99 % adherent to Atorvastatin, 100% adherent to Lisinopril .   [place] Pharmacist Assistant [phone]

## 2021-03-05 ENCOUNTER — Other Ambulatory Visit: Payer: Self-pay | Admitting: Family Medicine

## 2021-03-07 ENCOUNTER — Other Ambulatory Visit: Payer: Self-pay | Admitting: Family Medicine

## 2021-03-07 NOTE — Telephone Encounter (Signed)
Requested Prescriptions  Pending Prescriptions Disp Refills  . pioglitazone (ACTOS) 15 MG tablet [Pharmacy Med Name: PIOGLITAZONE HYDROCHLORIDE 15 MG Tablet] 90 tablet 1    Sig: TAKE 1 TABLET EVERY DAY     Endocrinology:  Diabetes - Glitazones - pioglitazone Failed - 03/07/2021  4:57 AM      Failed - HBA1C is between 0 and 7.9 and within 180 days    Hemoglobin A1C  Date Value Ref Range Status  12/10/2020 8.1 (A) 4.0 - 5.6 % Final   HbA1c, POC (controlled diabetic range)  Date Value Ref Range Status  07/03/2019 9.3 (A) 0.0 - 7.0 % Final         Passed - Valid encounter within last 6 months    Recent Outpatient Visits          2 months ago DM (diabetes mellitus), type 2, uncontrolled, with renal complications [facility])   [facility] [place], Drue Stager, MD   6 months ago DM (diabetes mellitus), type 2, uncontrolled, with renal complications [facility])   [facility] [place], Drue Stager, MD   9 months ago DM (diabetes mellitus), type 2, uncontrolled, with renal complications [facility])   [facility] [place], Drue Stager, MD   11 months ago DM (diabetes mellitus), type 2, uncontrolled, with renal complications [facility])   [facility] [place], Drue Stager, MD   12 months ago DM (diabetes mellitus), type 2, uncontrolled, with renal complications [facility])   [facility] Steele Sizer, MD      Future Appointments            In 1 month Ancil Boozer, Drue Stager, MD [facility], [place]   In 4 months  [facility], [facility]

## 2021-03-10 ENCOUNTER — Telehealth: Payer: Self-pay

## 2021-03-10 NOTE — Progress Notes (Signed)
Left Voice message to confirmed patient telephone appointment on 03/11/2021 for CCM at 1:00 pm with Junius Argyle the Clinical pharmacist.   [place] Pharmacist Assistant [phone]

## 2021-03-11 ENCOUNTER — Ambulatory Visit (INDEPENDENT_AMBULATORY_CARE_PROVIDER_SITE_OTHER): Payer: Medicare HMO

## 2021-03-11 DIAGNOSIS — E1129 Type 2 diabetes mellitus with other diabetic kidney complication: Secondary | ICD-10-CM

## 2021-03-11 DIAGNOSIS — J411 Mucopurulent chronic bronchitis: Secondary | ICD-10-CM

## 2021-03-11 DIAGNOSIS — E785 Hyperlipidemia, unspecified: Secondary | ICD-10-CM | POA: Diagnosis not present

## 2021-03-11 DIAGNOSIS — F330 Major depressive disorder, recurrent, mild: Secondary | ICD-10-CM | POA: Diagnosis not present

## 2021-03-11 DIAGNOSIS — E1165 Type 2 diabetes mellitus with hyperglycemia: Secondary | ICD-10-CM | POA: Diagnosis not present

## 2021-03-11 DIAGNOSIS — IMO0002 Reserved for concepts with insufficient information to code with codable children: Secondary | ICD-10-CM

## 2021-03-11 DIAGNOSIS — E1159 Type 2 diabetes mellitus with other circulatory complications: Secondary | ICD-10-CM

## 2021-03-11 DIAGNOSIS — E1169 Type 2 diabetes mellitus with other specified complication: Secondary | ICD-10-CM

## 2021-03-11 DIAGNOSIS — I152 Hypertension secondary to endocrine disorders: Secondary | ICD-10-CM

## 2021-03-11 NOTE — Patient Instructions (Addendum)
Visit Information It was great speaking with you today!  Please let me know if you have any questions about our visit.  Goals Addressed            This Visit's Progress   . DIET - EAT MORE FRUITS AND VEGETABLES   On track    Recommend to decrease portion sizes by eating 3 small healthy meals and at least 2 healthy snacks per day.    . Monitor and Manage My Blood Sugar-Diabetes Type 2   On track    Timeframe:  Long-Range Goal Priority:  High Start Date:  02/05/2021                           Expected End Date:  08/08/2021                     Follow Up Date 05/28/2021    -Check blood sugar daily before breakfast - check blood sugar if I feel it is too high or too low - enter blood sugar readings and medication or insulin into daily log - take the blood sugar log to all doctor visits    Why is this important?    Checking your blood sugar at home helps to keep it from getting very high or very low.   Writing the results in a diary or log helps the doctor know how to care for you.   Your blood sugar log should have the time, date and the results.   Also, write down the amount of insulin or other medicine that you take.   Other information, like what you ate, exercise done and how you were feeling, will also be helpful.     Notes:        Patient Care Plan: General Pharmacy (Adult)    Problem Identified: Hypertension, Hyperlipidemia, Diabetes, COPD, Depression, Anxiety and Chronic Pain, Insomnia   Priority: High    Long-Range Goal: Patient-Specific Goal   Start Date: 02/05/2021  Expected End Date: 08/08/2021  Recent Progress: On track  Priority: High  Note:   Current Barriers:  . Unable to independently afford treatment regimen . Unable to achieve control of Diabetes   Pharmacist Clinical Goal(s):  [name] Over the next 90 days, patient will achieve control of diabetes as evidenced by A1c less than 8% through collaboration with PharmD and provider.   Interventions: . 1:1  collaboration with Steele Sizer, MD regarding development and update of comprehensive plan of care as evidenced by provider attestation and co-signature . Inter-disciplinary care team collaboration (see longitudinal plan of care) . Comprehensive medication review performed; medication list updated in electronic medical record  Diabetes (A1c goal <8%) -Controlled -Current medications: . Pioglitazone 15 mg daily  . Soliqua 40 units at breakfast . Metformin XR 750 mg 2 tablets daily  -Medications previously tried: NA  -Current home glucose readings  Fasting Pre-Lunch  13-Apr 138 85  12-Apr 89   11-Apr 115   8-Apr 63 110  7-Apr 115   6-Apr 96   1-Apr 71 129  Average 98 108   -Reports hypoglycemic symptoms: felt pale, sweaty, shaky. Treated with banana nut muffin + spoonful of peanut butter.    -Patient has been more consistent about getting at least a small breakfast every morning.  -Current exercise: Patient does get routine exercise -Educated on Prevention and management of hypoglycemic episodes; Benefits of routine self-monitoring of blood sugar; -Counseled to check feet daily and  get yearly eye exams -Recommended decreasing Soliqua to 35 units daily -Recommend stopping pioglitazone at next PCP visit due to risk of hypoglycemia  Hypertension (BP goal <140/90) -Controlled -Current treatment: . Carvedilol 3.125 mg twice daily  . Furosemide 20 mg daily  . Lisinopril 40 mg daily  -Medications previously tried: NA  -Current home readings: 142/76, pulse 86, 121/69, pulse 82,137/84 pulse 75, 138/79 pulse 74 (wrist)  -Denies hypotensive/hypertensive symptoms -Educated on Daily salt intake goal < 2300 mg; Importance of home blood pressure monitoring; -Counseled to monitor BP at home weekly, document, and provide log at future appointments -Recommended to continue current medication  Hyperlipidemia: (LDL goal < 70) -Controlled -Current treatment: . Atorvastatin 80 mg daily   -Current antiplatelet treatment: . Aspirin 81 mg daily  -Medications previously tried: NA  -Educated on Importance of limiting foods high in cholesterol; -Recommended to continue current medication  COPD (Goal: control symptoms and prevent exacerbations) -Controlled -Current treatment  . Ventolin HFA 2 puffs every 6 hours as needed  . Bevespi 2 puffs at bedtime  -Medications previously tried: NA  -Gold Grade: Gold 1 (FEV1>80%) -Current COPD Classification:  A (low sx, <2 exacerbations/yr) -MMRC/CAT score: NA -Pulmonary function testing: FEV1 92% (Jun 2021) -Exacerbations requiring treatment in last 6 months: None -Patient reports consistent use of maintenance inhaler -Frequency of rescue inhaler use: Infrequently -Counseled on Benefits of consistent maintenance inhaler use -Recommended to continue current medication  Depression/Anxiety (Goal: Maintain stable mood) -Controlled -Current treatment: . Diazepam 5 mg every 8 hours as needed - Vertigo  . Trazodone 50 mg 1/2 - 1 tablet nightly as needed - Sleep -Medications previously tried/failed: NA -PHQ9: 4 -GAD7: NA -Educated on Benefits of medication for symptom control -Recommended to continue current medication  Patient Goals/Self-Care Activities . Over the next 90 days, patient will:  - check glucose daily before breakfast, document, and provide at future appointments  Follow Up Plan: Telephone follow up appointment with care management team member scheduled for:  06/10/2021 at 11:30 AM      Patient agreed to services and verbal consent obtained.   The patient verbalized understanding of instructions, educational materials, and care plan provided today and declined offer to receive copy of patient instructions, educational materials, and care plan.   Doristine Section, [facility] [phone]

## 2021-03-11 NOTE — Progress Notes (Signed)
Chronic Care Management Pharmacy Note  03/11/2021 Name:  PHILOMENA BUTTERMORE MRN:  [id number] DOB:  Sep 27, 1946  Subjective: Cindy Griffin is an 75 y.o. year old female who is a primary patient of Steele Sizer, MD.  The CCM team was consulted for assistance with disease management and care coordination needs.    Engaged with patient by telephone for follow up visit in response to provider referral for pharmacy case management and/or care coordination services.   Consent to Services:  The patient was given information about Chronic Care Management services, agreed to services, and gave verbal consent prior to initiation of services.  Please see initial visit note for detailed documentation.   Patient Care Team: Steele Sizer, MD as PCP - General (Family Medicine) Sharlet Salina, MD as Consulting Physician (Physical Medicine and Rehabilitation) Merlene Morse, MD as Consulting Physician (Orthopedic Surgery) Ileana Roup, MD as Consulting Physician (General Surgery) Germaine Pomfret, [facility] as Pharmacist (Pharmacist) Corey Skains, MD as Consulting Physician (Cardiology) Erby Pian, MD as Referring Physician (Pulmonary Disease) Cammie Sickle, MD as Consulting Physician (Hematology and Oncology)  Recent office visits: 12/10/20: Patient presented to Dr. Ancil Boozer for follow-up. A1c improved to 8.1%. Glipizide stopped.   Recent consult visits: 02/18/21: Patient presented to Dr. Sharlet Salina (Ortho) for trigger finger. Patient igven injection of Celeston+ Marcaine.  02/09/21: Patient presented to Dr. Sharlet Salina (Ortho) for osteoarthritis follow-up. Patient started on tramadol.   Hospital visits: None in previous 6 months  Objective:  Lab Results  Component Value Date   CREATININE 0.86 12/10/2020   BUN 15 12/10/2020   GFRNONAA 67 12/10/2020   GFRAA 77 12/10/2020   NA 141 12/10/2020   K 4.4 12/10/2020   CALCIUM 10.3 12/10/2020   CO2 28 12/10/2020    Lab  Results  Component Value Date/Time   HGBA1C 8.1 (A) 12/10/2020 09:23 AM   HGBA1C 9.7 (A) 09/03/2020 02:03 PM   HGBA1C 9.3 (A) 07/03/2019 09:19 AM   HGBA1C 6.9 03/01/2019 10:05 AM   FRUCTOSAMINE 264 12/27/2017 09:56 AM   MICROALBUR <0.2 12/10/2020 12:00 AM   MICROALBUR 0.6 11/12/2019 12:00 AM   MICROALBUR 50 07/22/2017 09:00 AM   MICROALBUR 50 07/15/2016 10:52 AM    Last diabetic Eye exam:  Lab Results  Component Value Date/Time   HMDIABEYEEXA No Retinopathy 10/08/2020 12:00 AM    Last diabetic Foot exam: No results found for: HMDIABFOOTEX   Lab Results  Component Value Date   CHOL 127 12/10/2020   HDL 56 12/10/2020   LDLCALC 52 12/10/2020   TRIG 104 12/10/2020   CHOLHDL 2.3 12/10/2020    Hepatic Function Latest Ref Rng & Units 12/10/2020 06/23/2020 12/21/2019  Total Protein 6.1 - 8.1 g/dL 7.4 7.4 7.3  Albumin 3.5 - 5.0 g/dL - 4.0 4.2  AST 10 - 35 U/L 15 22 22   ALT 6 - 29 U/L 15 18 20   Alk Phosphatase 38 - 126 U/L - 76 77  Total Bilirubin 0.2 - 1.2 mg/dL 0.3 0.5 0.6    No results found for: TSH, FREET4  CBC Latest Ref Rng & Units 12/10/2020 09/09/2020 06/23/2020  WBC 3.8 - 10.8 Thousand/uL 6.7 6.7 7.3  Hemoglobin 11.7 - 15.5 g/dL 13.2 12.3 11.8(L)  Hematocrit 35.0 - 45.0 % 39.2 38.8 36.0  Platelets 140 - 400 Thousand/uL 344 292 282    Lab Results  Component Value Date/Time   VD25OH 30 12/10/2020 12:00 AM   VD25OH 51 10/31/2018 12:06 PM    Clinical ASCVD:  No  The ASCVD Risk score Mikey Bussing DC Jr., et al., 2013) failed to calculate for the following reasons:   The valid total cholesterol range is 130 to 320 mg/dL    Depression screen [facility] 2/9 12/10/2020 07/15/2020 05/29/2020  Decreased Interest 0 0 0  Down, Depressed, Hopeless 0 0 0  PHQ - 2 Score 0 0 0  Altered sleeping 1 - 0  Tired, decreased energy 1 - 0  Change in appetite 0 - 0  Feeling bad or failure about yourself  1 - 0  Trouble concentrating 1 - 0  Moving slowly or fidgety/restless 0 - 0  Suicidal thoughts 0  - 0  PHQ-9 Score 4 - 0  Difficult doing work/chores Somewhat difficult - -  Some recent data might be hidden    Social History   Tobacco Use  Smoking Status Never Smoker  Smokeless Tobacco Never Used  Tobacco Comment   smoking cessation materials not required   BP Readings from Last 3 Encounters:  12/26/20 139/79  12/10/20 138/82  10/07/20 (!) 151/83   Pulse Readings from Last 3 Encounters:  12/26/20 74  12/10/20 76  10/07/20 72   Wt Readings from Last 3 Encounters:  12/26/20 239 lb (108.4 kg)  12/10/20 237 lb 11.2 oz (107.8 kg)  10/07/20 237 lb (107.5 kg)    Assessment/Interventions: Review of patient past medical history, allergies, medications, health status, including review of consultants reports, laboratory and other test data, was performed as part of comprehensive evaluation and provision of chronic care management services.   SDOH:  (Social Determinants of Health) assessments and interventions performed: Yes SDOH Interventions   Flowsheet Row Most Recent Value  SDOH Interventions   Financial Strain Interventions Intervention Not Indicated      CCM Care Plan  No Known Allergies  Medications Reviewed Today    Reviewed by Germaine Pomfret, Harris (Pharmacist) on 03/11/21 at 1327  Med List Status: <None>  Medication Order Taking? Sig Documenting Provider Last Dose Status Informant  acetaminophen (TYLENOL) 500 MG tablet [id number]  Take 1,000 mg by mouth every 6 (six) hours as needed for moderate pain or headache.  [provider]  Active Self  albuterol (VENTOLIN HFA) 108 (90 Base) MCG/ACT inhaler [id number]  Inhale 2 puffs into the lungs every 6 (six) hours as needed for wheezing or shortness of breath. Steele Sizer, MD  Active   Ascorbic Acid (VITAMIN C) 1000 MG tablet [id number]  Take 1,000 mg by mouth daily. [provider]  Active Self  aspirin EC 81 MG tablet [id number] Yes Take 1 tablet (81 mg total) by mouth daily. Steele Sizer, MD  Taking Active Self  atorvastatin (LIPITOR) 80 MG tablet [id number] Yes Take 1 tablet (80 mg total) by mouth daily. Steele Sizer, MD Taking Active   BD PEN NEEDLE MICRO U/F 32G X 6 MM MISC [id number]  1 each by Other route daily. Steele Sizer, MD  Active   BEVESPI AEROSPHERE 9-4.8 MCG/ACT AERO [id number] Yes INHALE 2 PUFFS BY MOUTH AT BEDTIME Steele Sizer, MD Taking Active   Biotin 5000 MCG CAPS [id number]  Take by mouth. [provider]  Active Self  Carboxymethylcellul-Glycerin (LUBRICATING EYE DROPS OP) [id number]  Place 2 drops into both eyes 2 (two) times daily as needed (for dry eyes). [provider]  Active Self  carvedilol (COREG) 3.125 MG tablet [id number] Yes Take 3.125 mg by mouth 2 (two) times daily with a meal. [provider] Taking Active  Med Note Michaelle Birks, Saraih Lorton A   Wed Mar 11, 2021  1:26 PM) Prescribed by Dr. Nehemiah Massed  cetirizine (ZYRTEC) 10 MG tablet [id number] Yes Take 10 mg by mouth every other day as needed for allergies.  [provider] Taking Active Self           Med Note Larwance Sachs A   Thu May 06, 2016 12:00 PM)    Cholecalciferol (VITAMIN D-3) 1000 units CAPS [id number]  Take 1,000 Units by mouth daily.  [provider]  Active Self  diazepam (VALIUM) 5 MG tablet [id number]  Take 1 tablet (5 mg total) by mouth every 8 (eight) hours as needed (vertigo). Nance Pear, MD  Active            Med Note (JEFFRIES, Langley Adie Dec 10, 2020  9:07 AM) PRN  famotidine (PEPCID) 20 MG tablet [id number]  TAKE 1 TABLET TWICE DAILY IN PLACE OF RANITIDINE Steele Sizer, MD  Active   fluticasone [facility]) 50 MCG/ACT nasal spray [id number] Yes USE 2 SPRAYS IN EACH NOSTRIL EVERY Eloise Harman, MD Taking Active   furosemide (LASIX) 20 MG tablet [id number] Yes TAKE 1 TABLET EVERY Eloise Harman, MD Taking Active   Insulin Glargine-Lixisenatide [facility]) 100-33 UNT-MCG/ML SOPN [id number] Yes INJECT 10 TO 50 UNITS  SUBCUTANEOUSLY ONCE DAILY Steele Sizer, MD Taking Active            Med Note Michaelle Birks, Tucker Steedley A   Wed Mar 11, 2021  1:25 PM) Currently taking 35 units daily  Lancets (ONETOUCH DELICA PLUS INOMVE72C) MISC [id number]  CHECK GLUCOSE THREE TIMES DAILY [provider]  Active   lisinopril (ZESTRIL) 40 MG tablet [id number] Yes TAKE 1 TABLET (40 MG TOTAL) BY MOUTH AT BEDTIME. Steele Sizer, MD Taking Active   meclizine (ANTIVERT) 25 MG tablet [id number]  Take 1 tablet (25 mg total) by mouth every 6 (six) hours. Steele Sizer, MD  Active   metFORMIN (GLUCOPHAGE-XR) 750 MG 24 hr tablet [id number] Yes TAKE 2 TABLETS BY MOUTH ONCE DAILY WITH Alta Corning, Drue Stager, MD Taking Active   Multiple Vitamins-Minerals (MULTIVITAMIN PO) [id number]  Take 1 tablet by mouth daily. [provider]  Active Self  Omega-3 1000 MG CAPS [id number]  Take by mouth. [provider]  Active   pioglitazone (ACTOS) 15 MG tablet [id number] Yes TAKE 1 TABLET EVERY DAY Sowles, Drue Stager, MD Taking Active   pregabalin (LYRICA) 75 MG capsule [id number]  Take 1 capsule (75 mg total) by mouth 2 (two) times daily. Steele Sizer, MD  Active   tiZANidine (ZANAFLEX) 2 MG tablet [id number]  1 po tid prn [provider]  Active   traMADol (ULTRAM) 50 MG tablet [id number]  Take 1 tablet by mouth 2 (two) times daily as needed.  Sharlet Salina, MD  Active   traZODone (DESYREL) 50 MG tablet [id number]  TAKE 1/2 TO 1 TABLET AT BEDTIME AS NEEDED FOR SLEEP Steele Sizer, MD  Active   Turmeric 500 MG CAPS [id number]  Take by mouth. [provider]  Active Self  vitamin B-12 (CYANOCOBALAMIN) 500 MCG tablet [id number]  Take 1,000 mcg by mouth daily. [provider]  Active Self          Patient Active Problem List   Diagnosis Date Noted  . Bilateral leg edema 05/01/2020  . SOBOE (shortness of breath on exertion) 05/01/2020  . Colon cancer, ascending ([place]) 05/25/2019  . Mild episode of  recurrent major depressive disorder ([place]) 08/01/2018  . SBO (  small bowel obstruction) and ileocolonic anastomosis from recent colectomy 05/22/2018  . Cancer of ascending colon - pT1N0 (0/21) s/p lap right colectomy 05/11/2018 05/21/2018  . Hypomagnesemia 05/21/2018  . Ileus following gastrointestinal surgery ([place]) 05/20/2018  . Essential hemorrhagic thrombocythemia ([place]) 03/29/2018  . Hypercalcemia 11/23/2015  . DDD (degenerative disc disease), lumbar 07/28/2015  . Spinal stenosis at L4-L5 level 06/12/2015  . Benign essential HTN 05/25/2015  . Bunion 05/25/2015  . Cardiac enlargement 05/25/2015  . Cervical radicular pain 05/25/2015  . Back pain, chronic 05/25/2015  . Osteoarthritis 05/25/2015  . Dyslipidemia 05/25/2015  . Gastric reflux 05/25/2015  . Insomnia 05/25/2015  . Eczema intertrigo 05/25/2015  . Bronchitis, chronic, mucopurulent ([place]) 05/25/2015  . Numerous moles 05/25/2015  . Morbid obesity ([place]) 05/25/2015  . Perennial allergic rhinitis 05/25/2015  . DM (diabetes mellitus), type 2, uncontrolled, with renal complications ([place]) 53/79/4327  . Snores 05/25/2015  . History of artificial joint 05/25/2015  . Degeneration of intervertebral disc of cervical region 02/06/2015  . Neuritis or radiculitis due to rupture of lumbar intervertebral disc 01/14/2015  . Abnormal presence of protein in urine 05/04/2010  . Vitamin D deficiency 05/04/2010    Immunization History  Administered Date(s) Administered  . Fluad Quad(high Dose 65+) 11/12/2019, 09/03/2020  . Influenza, High Dose Seasonal PF 08/29/2015, 08/25/2016, 08/01/2018  . Influenza, Seasonal, Injecte, Preservative Fre 08/03/2011, 08/21/2012  . Influenza,inj,Quad PF,6+ Mos 08/13/2013, 07/17/2014  . Influenza-Unspecified 07/17/2014, 08/31/2017  . PFIZER(Purple Top)SARS-COV-2 Vaccination 01/08/2020, 01/29/2020, 08/27/2020  . PPD Test 10/08/2015  . Pneumococcal Conjugate-13 07/17/2014  . Pneumococcal Polysaccharide-23  05/04/2010, 08/29/2015  . Tdap 05/04/2010  . Zoster 12/25/2010  . Zoster Recombinat (Shingrix) 09/18/2018, 11/27/2018    Conditions to be addressed/monitored:  Hypertension, Hyperlipidemia, Diabetes, COPD, Depression, Anxiety and Chronic Pain, Insomnia  Care Plan : General Pharmacy (Adult)  Updates made by Germaine Pomfret, RPH since 03/11/2021 12:00 AM    Problem: Hypertension, Hyperlipidemia, Diabetes, COPD, Depression, Anxiety and Chronic Pain, Insomnia   Priority: High    Long-Range Goal: Patient-Specific Goal   Start Date: 02/05/2021  Expected End Date: 08/08/2021  Recent Progress: On track  Priority: High  Note:   Current Barriers:  . Unable to independently afford treatment regimen . Unable to achieve control of Diabetes   Pharmacist Clinical Goal(s):  [name] Over the next 90 days, patient will achieve control of diabetes as evidenced by A1c less than 8% through collaboration with PharmD and provider.   Interventions: . 1:1 collaboration with Steele Sizer, MD regarding development and update of comprehensive plan of care as evidenced by provider attestation and co-signature . Inter-disciplinary care team collaboration (see longitudinal plan of care) . Comprehensive medication review performed; medication list updated in electronic medical record  Diabetes (A1c goal <8%) -Controlled -Current medications: . Pioglitazone 15 mg daily  . Soliqua 40 units at breakfast . Metformin XR 750 mg 2 tablets daily  -Medications previously tried: NA  -Current home glucose readings  Fasting Pre-Lunch  13-Apr 138 85  12-Apr 89   11-Apr 115   8-Apr 63 110  7-Apr 115   6-Apr 96   1-Apr 71 129  Average 98 108   -Reports hypoglycemic symptoms: felt pale, sweaty, shaky. Treated with banana nut muffin + spoonful of peanut butter.    -Patient has been more consistent about getting at least a small breakfast every morning.  -Current exercise: Patient does get routine  exercise -Educated on Prevention and management of hypoglycemic episodes; Benefits of routine self-monitoring of blood sugar; -  Counseled to check feet daily and get yearly eye exams -Recommended decreasing Soliqua to 35 units daily -Recommend stopping pioglitazone at next PCP visit due to risk of hypoglycemia  Hypertension (BP goal <140/90) -Controlled -Current treatment: . Carvedilol 3.125 mg twice daily  . Furosemide 20 mg daily  . Lisinopril 40 mg daily  -Medications previously tried: NA  -Current home readings: 142/76, pulse 86, 121/69, pulse 82,137/84 pulse 75, 138/79 pulse 74 (wrist)  -Denies hypotensive/hypertensive symptoms -Educated on Daily salt intake goal < 2300 mg; Importance of home blood pressure monitoring; -Counseled to monitor BP at home weekly, document, and provide log at future appointments -Recommended to continue current medication  Hyperlipidemia: (LDL goal < 70) -Controlled -Current treatment: . Atorvastatin 80 mg daily  -Current antiplatelet treatment: . Aspirin 81 mg daily  -Medications previously tried: NA  -Educated on Importance of limiting foods high in cholesterol; -Recommended to continue current medication  COPD (Goal: control symptoms and prevent exacerbations) -Controlled -Current treatment  . Ventolin HFA 2 puffs every 6 hours as needed  . Bevespi 2 puffs at bedtime  -Medications previously tried: NA  -Gold Grade: Gold 1 (FEV1>80%) -Current COPD Classification:  A (low sx, <2 exacerbations/yr) -MMRC/CAT score: NA -Pulmonary function testing: FEV1 92% (Jun 2021) -Exacerbations requiring treatment in last 6 months: None -Patient reports consistent use of maintenance inhaler -Frequency of rescue inhaler use: Infrequently -Counseled on Benefits of consistent maintenance inhaler use -Recommended to continue current medication  Depression/Anxiety (Goal: Maintain stable mood) -Controlled -Current treatment: . Diazepam 5 mg every 8 hours  as needed - Vertigo  . Trazodone 50 mg 1/2 - 1 tablet nightly as needed - Sleep -Medications previously tried/failed: NA -PHQ9: 4 -GAD7: NA -Educated on Benefits of medication for symptom control -Recommended to continue current medication  Patient Goals/Self-Care Activities . Over the next 90 days, patient will:  - check glucose daily before breakfast, document, and provide at future appointments  Follow Up Plan: Telephone follow up appointment with care management team member scheduled for:  06/10/2021 at 11:30 AM      Medication Assistance: Application for Bevespi  medication assistance program. in process.  Anticipated assistance start date 02/12/21.  See plan of care for additional detail.  Patient's preferred pharmacy is:  [facility], [place] [address] Phone: [phone] Fax: [phone]  [facility] [address] [place] - [place]) [place] [address] Phone: [phone] Fax: [phone]  Uses pill box? Yes Pt endorses 100% compliance  We discussed: Current pharmacy is preferred with insurance plan and patient is satisfied with pharmacy services Patient decided to: Continue current medication management strategy  Care Plan and Follow Up Patient Decision:  Patient agrees to Care Plan and Follow-up.  Plan: Telephone follow up appointment with care management team member scheduled for:  06/10/2021 at 11:30 AM  [facility] [phone]

## 2021-03-30 ENCOUNTER — Other Ambulatory Visit: Payer: Self-pay | Admitting: Family Medicine
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
## 2021-04-15 ENCOUNTER — Ambulatory Visit (INDEPENDENT_AMBULATORY_CARE_PROVIDER_SITE_OTHER): Payer: Medicare HMO | Admitting: Family Medicine

## 2021-04-15 ENCOUNTER — Encounter: Payer: Self-pay | Admitting: Family Medicine

## 2021-04-15 ENCOUNTER — Other Ambulatory Visit: Payer: Self-pay

## 2021-04-15 VITALS — BP 140/78 | HR 83 | Temp 98.1°F | Resp 16 | Ht 64.0 in | Wt 249.0 lb

## 2021-04-15 DIAGNOSIS — E119 Type 2 diabetes mellitus without complications: Secondary | ICD-10-CM

## 2021-04-15 DIAGNOSIS — E538 Deficiency of other specified B group vitamins: Secondary | ICD-10-CM

## 2021-04-15 DIAGNOSIS — Z1231 Encounter for screening mammogram for malignant neoplasm of breast: Secondary | ICD-10-CM | POA: Diagnosis not present

## 2021-04-15 DIAGNOSIS — E1142 Type 2 diabetes mellitus with diabetic polyneuropathy: Secondary | ICD-10-CM

## 2021-04-15 DIAGNOSIS — K219 Gastro-esophageal reflux disease without esophagitis: Secondary | ICD-10-CM

## 2021-04-15 DIAGNOSIS — E1121 Type 2 diabetes mellitus with diabetic nephropathy: Secondary | ICD-10-CM

## 2021-04-15 DIAGNOSIS — F325 Major depressive disorder, single episode, in full remission: Secondary | ICD-10-CM

## 2021-04-15 DIAGNOSIS — J411 Mucopurulent chronic bronchitis: Secondary | ICD-10-CM

## 2021-04-15 DIAGNOSIS — E1169 Type 2 diabetes mellitus with other specified complication: Secondary | ICD-10-CM

## 2021-04-15 DIAGNOSIS — E669 Obesity, unspecified: Secondary | ICD-10-CM | POA: Diagnosis not present

## 2021-04-15 DIAGNOSIS — I10 Essential (primary) hypertension: Secondary | ICD-10-CM

## 2021-04-15 DIAGNOSIS — E1129 Type 2 diabetes mellitus with other diabetic kidney complication: Secondary | ICD-10-CM

## 2021-04-15 DIAGNOSIS — D692 Other nonthrombocytopenic purpura: Secondary | ICD-10-CM | POA: Diagnosis not present

## 2021-04-15 DIAGNOSIS — E785 Hyperlipidemia, unspecified: Secondary | ICD-10-CM

## 2021-04-15 DIAGNOSIS — E559 Vitamin D deficiency, unspecified: Secondary | ICD-10-CM

## 2021-04-15 LAB — POCT GLYCOSYLATED HEMOGLOBIN (HGB A1C): Hemoglobin A1C: 6.6 % — AB (ref 4.0–5.6)

## 2021-04-15 MED ORDER — METFORMIN HCL ER 750 MG PO TB24: 1500.0000 mg | ORAL_TABLET | Freq: Every day | ORAL | 1 refills | Status: DC

## 2021-04-15 MED ORDER — ATORVASTATIN CALCIUM 80 MG PO TABS: 80.0000 mg | ORAL_TABLET | Freq: Every day | ORAL | 1 refills | Status: DC

## 2021-04-15 MED ORDER — FAMOTIDINE 20 MG PO TABS
20.0000 mg | ORAL_TABLET | Freq: Two times a day (BID) | ORAL | 1 refills | Status: DC
Start: 2021-04-15 — End: 2021-09-19

## 2021-04-15 MED ORDER — LISINOPRIL 40 MG PO TABS: 40.0000 mg | ORAL_TABLET | Freq: Every day | ORAL | 1 refills | Status: DC

## 2021-04-26 DIAGNOSIS — Z03818 Encounter for observation for suspected exposure to other biological agents ruled out: Secondary | ICD-10-CM | POA: Diagnosis not present

## 2021-04-26 DIAGNOSIS — Z20822 Contact with and (suspected) exposure to covid-19: Secondary | ICD-10-CM | POA: Diagnosis not present

## 2021-04-26 DIAGNOSIS — J40 Bronchitis, not specified as acute or chronic: Secondary | ICD-10-CM | POA: Diagnosis not present

## 2021-05-13 ENCOUNTER — Other Ambulatory Visit: Payer: Self-pay | Admitting: Family Medicine

## 2021-05-14 ENCOUNTER — Other Ambulatory Visit: Payer: Self-pay | Admitting: Family Medicine

## 2021-05-20 ENCOUNTER — Other Ambulatory Visit: Payer: Self-pay | Admitting: Family Medicine

## 2021-05-20 DIAGNOSIS — J3089 Other allergic rhinitis: Secondary | ICD-10-CM

## 2021-05-20 NOTE — Telephone Encounter (Signed)
Requested Prescriptions  Pending Prescriptions Disp Refills  . fluticasone (FLONASE) 50 MCG/ACT nasal spray [Pharmacy Med Name: FLUTICASONE PROPIONATE 50 MCG/ACT Suspension] 48 g 1    Sig: USE 2 SPRAYS IN EACH NOSTRIL EVERY DAY     Ear, Nose, and Throat: Nasal Preparations - Corticosteroids Passed - 05/20/2021  6:30 AM      Passed - Valid encounter within last 12 months    Recent Outpatient Visits          1 month ago Diabetes mellitus type 2 in obese [facility])   [facility] [place], Drue Stager, MD   5 months ago DM (diabetes mellitus), type 2, uncontrolled, with renal complications [facility])   [facility] [place], Drue Stager, MD   8 months ago DM (diabetes mellitus), type 2, uncontrolled, with renal complications [facility])   [facility] [place], Drue Stager, MD   11 months ago DM (diabetes mellitus), type 2, uncontrolled, with renal complications [facility])   [facility] [place], Drue Stager, MD   1 year ago DM (diabetes mellitus), type 2, uncontrolled, with renal complications [facility] [place])   [facility] Steele Sizer, MD      Future Appointments            In 1 month  [facility], [place]   In 2 months Steele Sizer, MD [facility], [facility]

## 2021-05-22 ENCOUNTER — Telehealth: Payer: Self-pay

## 2021-05-22 NOTE — Progress Notes (Signed)
Chronic Care Management Pharmacy Assistant   Name: Cindy Griffin  MRN: [id number] DOB: [birth year]  Reason for Encounter:Diabetes Disease State Call.   Recent office visits:  04/15/2021 Dr.Sowles MD (PCP) Stop Pioglitazone 15 mg daily  Recent consult visits:  04/26/2021 Orvilla Fus DO (Internal Medicine) started doxycycline (VIBRA-TABS) 100 MG tablet; Take 1 tablet (100 mg total) by mouth 2 (two) times daily for 10 days, Started codeine-guaifenesin 10-100 mg/5 mL oral liquid; Take 5 mLs by mouth every 6 (six) hours as needed for Cough  Hospital visits:  None in previous 6 months  Medications: Outpatient Encounter Medications as of 05/22/2021  Medication Sig Note   acetaminophen (TYLENOL) 500 MG tablet Take 1,000 mg by mouth every 6 (six) hours as needed for moderate pain or headache.     albuterol (VENTOLIN HFA) 108 (90 Base) MCG/ACT inhaler Inhale 2 puffs into the lungs every 6 (six) hours as needed for wheezing or shortness of breath.    Ascorbic Acid (VITAMIN C) 1000 MG tablet Take 1,000 mg by mouth daily.    aspirin EC 81 MG tablet Take 1 tablet (81 mg total) by mouth daily.    atorvastatin (LIPITOR) 80 MG tablet Take 1 tablet (80 mg total) by mouth daily.    BD PEN NEEDLE MICRO U/F 32G X 6 MM MISC 1 each by Other route daily.    BEVESPI AEROSPHERE 9-4.8 MCG/ACT AERO INHALE 2 PUFFS BY MOUTH AT BEDTIME    Biotin 5000 MCG CAPS Take by mouth.    Carboxymethylcellul-Glycerin (LUBRICATING EYE DROPS OP) Place 2 drops into both eyes 2 (two) times daily as needed (for dry eyes).    carvedilol (COREG) 3.125 MG tablet Take 3.125 mg by mouth 2 (two) times daily with a meal. 03/11/2021: Prescribed by Dr. Nehemiah Massed   cetirizine (ZYRTEC) 10 MG tablet Take 10 mg by mouth every other day as needed for allergies.     Cholecalciferol (VITAMIN D-3) 1000 units CAPS Take 1,000 Units by mouth daily.     famotidine (PEPCID) 20 MG tablet Take 1 tablet (20 mg total) by mouth 2 (two) times daily.     fluticasone (FLONASE) 50 MCG/ACT nasal spray USE 2 SPRAYS IN EACH NOSTRIL EVERY DAY    furosemide (LASIX) 20 MG tablet TAKE 1 TABLET EVERY DAY    Lancets (ONETOUCH DELICA PLUS [id number]) MISC CHECK GLUCOSE THREE TIMES DAILY    lisinopril (ZESTRIL) 40 MG tablet Take 1 tablet (40 mg total) by mouth at bedtime.    meclizine (ANTIVERT) 25 MG tablet Take 1 tablet (25 mg total) by mouth every 6 (six) hours.    metFORMIN (GLUCOPHAGE-XR) 750 MG 24 hr tablet Take 2 tablets (1,500 mg total) by mouth daily with breakfast.    Multiple Vitamins-Minerals (MULTIVITAMIN PO) Take 1 tablet by mouth daily.    Omega-3 1000 MG CAPS Take by mouth.    pregabalin (LYRICA) 75 MG capsule Take 1 capsule (75 mg total) by mouth 2 (two) times daily.    SOLIQUA 100-33 UNT-MCG/ML SOPN INJECT 10 TO 50 UNITS SUBCUTANEOUSLY ONCE DAILY    tiZANidine (ZANAFLEX) 2 MG tablet 1 po tid prn    traMADol (ULTRAM) 50 MG tablet Take 1 tablet by mouth 2 (two) times daily as needed.     traZODone (DESYREL) 50 MG tablet TAKE 1/2 TO 1 TABLET AT BEDTIME AS NEEDED FOR SLEEP    Turmeric 500 MG CAPS Take by mouth.    vitamin B-12 (CYANOCOBALAMIN) 500 MCG tablet Take 1,000 mcg by  mouth daily.    No facility-administered encounter medications on file as of 05/22/2021.    Care Gaps: Annual Wellness  Star Rating Drugs: Atorvastatin 80 mg last filled on 02/28/2021 for 90 day supply  Lisinopril 40 mg last filled on 03/05/2021 for 90 day supply Metformin 750 mg last filled on 03/05/2021 for 90 day supply  Recent Relevant Labs: Lab Results  Component Value Date/Time   HGBA1C 6.6 (A) 04/15/2021 09:40 AM   HGBA1C 8.1 (A) 12/10/2020 09:23 AM   HGBA1C 9.3 (A) 07/03/2019 09:19 AM   HGBA1C 6.9 03/01/2019 10:05 AM   MICROALBUR <0.2 12/10/2020 12:00 AM   MICROALBUR 0.6 11/12/2019 12:00 AM   MICROALBUR 50 07/22/2017 09:00 AM   MICROALBUR 50 07/15/2016 10:52 AM    Kidney Function Lab Results  Component Value Date/Time   CREATININE 0.86 12/10/2020  12:00 AM   CREATININE 0.85 09/09/2020 01:08 PM   CREATININE 0.98 06/23/2020 01:26 PM   CREATININE 0.76 11/12/2019 12:00 AM   GFRNONAA 67 12/10/2020 12:00 AM   GFRAA 77 12/10/2020 12:00 AM    Current antihyperglycemic regimen:  Soliqua 45 units at breakfast Metformin XR 750 mg 2 tablets daily What recent interventions/DTPs have been made to improve glycemic control:  04/15/2021 Dr.Sowles MD  Stop Pioglitazone 15 mg daily Have there been any recent hospitalizations or ED visits since last visit with CPP? No   Adherence Review: Is the patient currently on a STATIN medication? Yes Is the patient currently on ACE/ARB medication? Yes Does the patient have >5 day gap between last estimated fill dates? No  Patient is schedule for a telephone follow up with clinical pharmacist on 06/10/2021 at 11:30 am. Patient is schedule for a annual wellness visit on 07/16/2021 at 3:30 pm.  I have attempted without success to contact this patient by phone three times to do her Diabetes Disease State call. Unable to leave a message due to HIPPA 06/24,06/27,06/28  [place] Pharmacist Assistant [phone]

## 2021-05-28 ENCOUNTER — Other Ambulatory Visit: Payer: Self-pay

## 2021-05-28 ENCOUNTER — Other Ambulatory Visit: Payer: Self-pay | Admitting: Family Medicine

## 2021-05-28 ENCOUNTER — Ambulatory Visit
Admission: RE | Admit: 2021-05-28 | Discharge: 2021-05-28 | Disposition: A | Discharge status: Home / Self Care | Payer: Medicare HMO | Source: Ambulatory Visit | Attending: Family Medicine | Admitting: Family Medicine

## 2021-05-28 DIAGNOSIS — Z1231 Encounter for screening mammogram for malignant neoplasm of breast: Secondary | ICD-10-CM

## 2021-05-28 DIAGNOSIS — N63 Unspecified lump in unspecified breast: Secondary | ICD-10-CM

## 2021-05-28 DIAGNOSIS — R928 Other abnormal and inconclusive findings on diagnostic imaging of breast: Secondary | ICD-10-CM | POA: Insufficient documentation

## 2021-05-28 DIAGNOSIS — N6322 Unspecified lump in the left breast, upper inner quadrant: Secondary | ICD-10-CM | POA: Diagnosis not present

## 2021-06-08 ENCOUNTER — Telehealth: Payer: Self-pay

## 2021-06-08 NOTE — Progress Notes (Signed)
Patient is requesting to reschedule her telephone appointment that is schedule on 06/10/2021 at 11:30 am with the clinical pharmacist to 07/02/2021 at 8:00 am for a telephone follow up. Sent message to scheduler.  [place] Pharmacist Assistant [phone]

## 2021-06-08 NOTE — Telephone Encounter (Signed)
Pt called to reschedule pharmacist appt and asked to speak with Alex/ I advised her Marnette Burgess will call back/ Pt stated she will not be at pone after 9:30am and asked if Bessie can call before 9:30am today/ please advise

## 2021-06-09 ENCOUNTER — Other Ambulatory Visit: Payer: Self-pay | Admitting: Family Medicine

## 2021-06-09 ENCOUNTER — Telehealth: Payer: Self-pay

## 2021-06-09 DIAGNOSIS — N63 Unspecified lump in unspecified breast: Secondary | ICD-10-CM

## 2021-06-09 NOTE — Progress Notes (Signed)
Patient states she received a letter in the mail from social security about her coverage for patient assistance.Patient states she is going to make a copy and place the letter in a envelope with the clinical pharmacist name and leave it at the front desk.Patient states she will try to bring it to her PCP  office tomorrow for the Clinical Pharmacist to review the letter.Patient is asking if the clinical pharmacist could review the letter, and let her know if she needs to do anything to continue her patient assistance that she receives. Notified Clinical Pharmacist.  [place]ess[place] Pharmacist Assistant [phone]

## 2021-06-10 ENCOUNTER — Telehealth: Payer: Self-pay

## 2021-06-15 DIAGNOSIS — M65342 Trigger finger, left ring finger: Secondary | ICD-10-CM | POA: Diagnosis not present

## 2021-06-15 DIAGNOSIS — M1611 Unilateral primary osteoarthritis, right hip: Secondary | ICD-10-CM | POA: Diagnosis not present

## 2021-06-15 DIAGNOSIS — M5136 Other intervertebral disc degeneration, lumbar region: Secondary | ICD-10-CM | POA: Diagnosis not present

## 2021-06-15 DIAGNOSIS — M5030 Other cervical disc degeneration, unspecified cervical region: Secondary | ICD-10-CM | POA: Diagnosis not present

## 2021-06-15 DIAGNOSIS — M5412 Radiculopathy, cervical region: Secondary | ICD-10-CM | POA: Diagnosis not present

## 2021-06-15 DIAGNOSIS — M5416 Radiculopathy, lumbar region: Secondary | ICD-10-CM | POA: Diagnosis not present

## 2021-06-17 NOTE — Progress Notes (Signed)
Reach out to patient to inform her per clinical pharmacist,  SSA paper she sent in was about the Extra Help Program. She needs to call [phone] to request a phone appointment to ensure she continues to receive Extra Help.  Patient verbalized understanding.  [place] Pharmacist Assistant [phone]

## 2021-06-18 ENCOUNTER — Telehealth: Payer: Self-pay

## 2021-06-24 ENCOUNTER — Inpatient Hospital Stay: Payer: Medicare HMO | Attending: Internal Medicine

## 2021-06-24 ENCOUNTER — Other Ambulatory Visit: Payer: Self-pay

## 2021-06-24 DIAGNOSIS — E669 Obesity, unspecified: Secondary | ICD-10-CM | POA: Insufficient documentation

## 2021-06-24 DIAGNOSIS — Z79899 Other long term (current) drug therapy: Secondary | ICD-10-CM | POA: Insufficient documentation

## 2021-06-24 DIAGNOSIS — R970 Elevated carcinoembryonic antigen [CEA]: Secondary | ICD-10-CM | POA: Diagnosis not present

## 2021-06-24 DIAGNOSIS — E1165 Type 2 diabetes mellitus with hyperglycemia: Secondary | ICD-10-CM | POA: Diagnosis not present

## 2021-06-24 DIAGNOSIS — Z800 Family history of malignant neoplasm of digestive organs: Secondary | ICD-10-CM | POA: Insufficient documentation

## 2021-06-24 DIAGNOSIS — I10 Essential (primary) hypertension: Secondary | ICD-10-CM | POA: Diagnosis not present

## 2021-06-24 DIAGNOSIS — J449 Chronic obstructive pulmonary disease, unspecified: Secondary | ICD-10-CM | POA: Insufficient documentation

## 2021-06-24 DIAGNOSIS — G8929 Other chronic pain: Secondary | ICD-10-CM | POA: Insufficient documentation

## 2021-06-24 DIAGNOSIS — C182 Malignant neoplasm of ascending colon: Secondary | ICD-10-CM | POA: Insufficient documentation

## 2021-06-24 DIAGNOSIS — M2550 Pain in unspecified joint: Secondary | ICD-10-CM | POA: Insufficient documentation

## 2021-06-24 DIAGNOSIS — Z7984 Long term (current) use of oral hypoglycemic drugs: Secondary | ICD-10-CM | POA: Diagnosis not present

## 2021-06-24 DIAGNOSIS — E785 Hyperlipidemia, unspecified: Secondary | ICD-10-CM | POA: Diagnosis not present

## 2021-06-24 DIAGNOSIS — G4700 Insomnia, unspecified: Secondary | ICD-10-CM | POA: Insufficient documentation

## 2021-06-24 DIAGNOSIS — K219 Gastro-esophageal reflux disease without esophagitis: Secondary | ICD-10-CM | POA: Diagnosis not present

## 2021-06-24 DIAGNOSIS — Z7982 Long term (current) use of aspirin: Secondary | ICD-10-CM | POA: Diagnosis not present

## 2021-06-25 LAB — CEA: CEA: 6.7 ng/mL — ABNORMAL HIGH (ref 0.0–4.7)

## 2021-06-26 ENCOUNTER — Inpatient Hospital Stay: Payer: Medicare HMO | Admitting: Internal Medicine

## 2021-06-26 ENCOUNTER — Encounter: Payer: Self-pay | Admitting: Internal Medicine

## 2021-06-26 DIAGNOSIS — J449 Chronic obstructive pulmonary disease, unspecified: Secondary | ICD-10-CM | POA: Diagnosis not present

## 2021-06-26 DIAGNOSIS — C182 Malignant neoplasm of ascending colon: Secondary | ICD-10-CM

## 2021-06-26 DIAGNOSIS — G8929 Other chronic pain: Secondary | ICD-10-CM | POA: Diagnosis not present

## 2021-06-26 DIAGNOSIS — E669 Obesity, unspecified: Secondary | ICD-10-CM | POA: Diagnosis not present

## 2021-06-26 DIAGNOSIS — R970 Elevated carcinoembryonic antigen [CEA]: Secondary | ICD-10-CM | POA: Diagnosis not present

## 2021-06-26 DIAGNOSIS — E785 Hyperlipidemia, unspecified: Secondary | ICD-10-CM | POA: Diagnosis not present

## 2021-06-26 DIAGNOSIS — E1165 Type 2 diabetes mellitus with hyperglycemia: Secondary | ICD-10-CM | POA: Diagnosis not present

## 2021-06-26 DIAGNOSIS — K219 Gastro-esophageal reflux disease without esophagitis: Secondary | ICD-10-CM | POA: Diagnosis not present

## 2021-06-26 DIAGNOSIS — M2550 Pain in unspecified joint: Secondary | ICD-10-CM | POA: Diagnosis not present

## 2021-06-26 NOTE — Assessment & Plan Note (Addendum)
#  Colon cancer-of ascending colon- stage I; MSI mismatch/sporadic; Excellent prognosis more than 90% chance of cure [see discussion below regarding elevated CEA]; January 2021-CT abdomen pelvis negative for any recurrence.  Reminded the patient to continue follow-up with GI [Dr.Anna]-regarding follow-up colonoscopy.  #Abnormal CEA-around 5-7 ; overall -STABLE;  Low clinical suspicion of any recurrent malignancy.  Monitor for now.    # Fatigue: ? Sec poorly controlled DM-HbA1c- 8.7- pt has been trying to exercise; recommend sleep study/see regarding weight loss.   #Obesity/weight loss:  Discussed importance of healthy weight/and weight loss.  I strongly recommend eating more green leafy vegetables and cutting down processed food/ carbohydrates.  Instead increasing whole grains / protein in the diet.  Multiple studies have shown that optimal weight would help improve cardiovascular risk; also shown to cut on the risk of malignancies-colon cancer, breast cancer ovarian/uterine cancer in women and also prostate cancer in men.    # DISPOSITION:  # follow up in 6 months-MD; labs- CEA 2 days prior Dr.B
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
## 2021-07-01 ENCOUNTER — Telehealth: Payer: Self-pay

## 2021-07-01 NOTE — Progress Notes (Signed)
Spoke to patient to confirmed patient telephone appointment on 07/02/2021 for CCM at 8:00 am with Junius Argyle the Clinical pharmacist.   Patient Verbalized understanding and denies any side effects from any of her current medications.Patient states she has no concerns at this time.  [place] Pharmacist Assistant [phone]

## 2021-07-02 ENCOUNTER — Ambulatory Visit (INDEPENDENT_AMBULATORY_CARE_PROVIDER_SITE_OTHER): Payer: Medicare HMO

## 2021-07-02 DIAGNOSIS — Z794 Long term (current) use of insulin: Secondary | ICD-10-CM

## 2021-07-02 DIAGNOSIS — E785 Hyperlipidemia, unspecified: Secondary | ICD-10-CM

## 2021-07-02 DIAGNOSIS — E1169 Type 2 diabetes mellitus with other specified complication: Secondary | ICD-10-CM

## 2021-07-02 DIAGNOSIS — E11649 Type 2 diabetes mellitus with hypoglycemia without coma: Secondary | ICD-10-CM

## 2021-07-02 NOTE — Progress Notes (Signed)
Chronic Care Management Pharmacy Note  07/14/2021 Name:  Cindy Griffin MRN:  [id number] DOB:  1946-04-01  Summary: Patient presents for CCM follow-up. Blood sugars ranging 63-172 throughout the day. She continues to have episodes of nocturnal hypoglycemia.   Recommendations/Changes made from today's visit: -DECREASE Soliqua to 30 units daily  Plan: CPP follow-up 2 months  Subjective: Cindy Griffin is an 75 y.o. year old female who is a primary patient of Steele Sizer, MD.  The CCM team was consulted for assistance with disease management and care coordination needs.    Engaged with patient by telephone for follow up visit in response to provider referral for pharmacy case management and/or care coordination services.   Consent to Services:  The patient was given information about Chronic Care Management services, agreed to services, and gave verbal consent prior to initiation of services.  Please see initial visit note for detailed documentation.   Patient Care Team: Steele Sizer, MD as PCP - General (Family Medicine) Sharlet Salina, MD as Consulting Physician (Physical Medicine and Rehabilitation) Merlene Morse, MD as Consulting Physician (Orthopedic Surgery) Ileana Roup, MD as Consulting Physician (General Surgery) Germaine Pomfret, [facility] as Pharmacist (Pharmacist) Corey Skains, MD as Consulting Physician (Cardiology) Erby Pian, MD as Referring Physician (Pulmonary Disease) Cammie Sickle, MD as Consulting Physician (Hematology and Oncology)  Recent office visits: 12/10/20: Patient presented to Dr. Ancil Boozer for follow-up. A1c improved to 8.1%. Glipizide stopped.   Recent consult visits: 02/18/21: Patient presented to Dr. Sharlet Salina (Ortho) for trigger finger. Patient igven injection of Celeston+ Marcaine.  02/09/21: Patient presented to Dr. Sharlet Salina (Ortho) for osteoarthritis follow-up. Patient started on tramadol.   Hospital visits: None  in previous 6 months  Objective:  Lab Results  Component Value Date   CREATININE 0.86 12/10/2020   BUN 15 12/10/2020   GFRNONAA 67 12/10/2020   GFRAA 77 12/10/2020   NA 141 12/10/2020   K 4.4 12/10/2020   CALCIUM 10.3 12/10/2020   CO2 28 12/10/2020    Lab Results  Component Value Date/Time   HGBA1C 6.6 (A) 04/15/2021 09:40 AM   HGBA1C 8.1 (A) 12/10/2020 09:23 AM   HGBA1C 9.3 (A) 07/03/2019 09:19 AM   HGBA1C 6.9 03/01/2019 10:05 AM   FRUCTOSAMINE 264 12/27/2017 09:56 AM   MICROALBUR <0.2 12/10/2020 12:00 AM   MICROALBUR 0.6 11/12/2019 12:00 AM   MICROALBUR 50 07/22/2017 09:00 AM   MICROALBUR 50 07/15/2016 10:52 AM    Last diabetic Eye exam:  Lab Results  Component Value Date/Time   HMDIABEYEEXA No Retinopathy 10/08/2020 12:00 AM    Last diabetic Foot exam: No results found for: HMDIABFOOTEX   Lab Results  Component Value Date   CHOL 127 12/10/2020   HDL 56 12/10/2020   LDLCALC 52 12/10/2020   TRIG 104 12/10/2020   CHOLHDL 2.3 12/10/2020    Hepatic Function Latest Ref Rng & Units 12/10/2020 06/23/2020 12/21/2019  Total Protein 6.1 - 8.1 g/dL 7.4 7.4 7.3  Albumin 3.5 - 5.0 g/dL - 4.0 4.2  AST 10 - 35 U/L _0 ALT 6 - 29 U/L _1 Alk Phosphatase 38 - 126 U/L - 76 77  Total Bilirubin 0.2 - 1.2 mg/dL 0.3 0.5 0.6    No results found for: TSH, FREET4  CBC Latest Ref Rng & Units 12/10/2020 09/09/2020 06/23/2020  WBC 3.8 - 10.8 Thousand/uL 6.7 6.7 7.3  Hemoglobin 11.7 - 15.5 g/dL 13.2 12.3 11.8(L)  Hematocrit 35.0 - 45.0 %  39.2 38.8 36.0  Platelets 140 - 400 Thousand/uL 344 292 282    Lab Results  Component Value Date/Time   VD25OH 30 12/10/2020 12:00 AM   VD25OH 51 10/31/2018 12:06 PM    Clinical ASCVD: No  The ASCVD Risk score Mikey Bussing DC Jr., et al., 2013) failed to calculate for the following reasons:   The valid total cholesterol range is 130 to 320 mg/dL    Depression screen [facility] 2/9 04/15/2021 12/10/2020 07/15/2020  Decreased Interest 0 0 0  Down,  Depressed, Hopeless 0 0 0  PHQ - 2 Score 0 0 0  Altered sleeping 0 1 -  Tired, decreased energy 1 1 -  Change in appetite 1 0 -  Feeling bad or failure about yourself  0 1 -  Trouble concentrating 0 1 -  Moving slowly or fidgety/restless 0 0 -  Suicidal thoughts 0 0 -  PHQ-9 Score 2 4 -  Difficult doing work/chores - Somewhat difficult -  Some recent data might be hidden    Social History   Tobacco Use  Smoking Status Never  Smokeless Tobacco Never  Tobacco Comments   smoking cessation materials not required   BP Readings from Last 3 Encounters:  06/26/21 132/70  04/15/21 140/78  12/26/20 139/79   Pulse Readings from Last 3 Encounters:  06/26/21 80  04/15/21 83  12/26/20 74   Wt Readings from Last 3 Encounters:  06/26/21 250 lb (113.4 kg)  04/15/21 249 lb (112.9 kg)  12/26/20 239 lb (108.4 kg)    Assessment/Interventions: Review of patient past medical history, allergies, medications, health status, including review of consultants reports, laboratory and other test data, was performed as part of comprehensive evaluation and provision of chronic care management services.   SDOH:  (Social Determinants of Health) assessments and interventions performed: Yes SDOH Interventions    Flowsheet Row Most Recent Value  SDOH Interventions   Financial Strain Interventions Intervention Not Indicated        CCM Care Plan  No Known Allergies  Medications Reviewed Today     Reviewed by Lorrin Jackson, CMA (Certified Medical Assistant) on 06/26/21 at 26  Med List Status: <None>   Medication Order Taking? Sig Documenting Provider Last Dose Status Informant  acetaminophen (TYLENOL) 500 MG tablet [id number] Yes Take 1,000 mg by mouth every 6 (six) hours as needed for moderate pain or headache.  [provider] Taking Active Self  albuterol (VENTOLIN HFA) 108 (90 Base) MCG/ACT inhaler [id number] Yes Inhale 2 puffs into the lungs every 6 (six) hours as needed for  wheezing or shortness of breath. Steele Sizer, MD Taking Active   Ascorbic Acid (VITAMIN C) 1000 MG tablet [id number] Yes Take 1,000 mg by mouth daily. [provider] Taking Active Self  aspirin EC 81 MG tablet [id number] Yes Take 1 tablet (81 mg total) by mouth daily. Steele Sizer, MD Taking Active Self  atorvastatin (LIPITOR) 80 MG tablet [id number] Yes Take 1 tablet (80 mg total) by mouth daily. Steele Sizer, MD Taking Active   BD PEN NEEDLE MICRO U/F 32G X 6 MM MISC [id number] Yes 1 each by Other route daily. Steele Sizer, MD Taking Active   BEVESPI AEROSPHERE 9-4.8 MCG/ACT AERO [id number] Yes INHALE 2 PUFFS BY MOUTH AT BEDTIME Steele Sizer, MD Taking Active   Biotin 5000 MCG CAPS [id number] Yes Take by mouth. [provider] Taking Active Self  Carboxymethylcellul-Glycerin (LUBRICATING EYE DROPS OP) [id number] Yes Place 2 drops into both eyes 2 (two) times  daily as needed (for dry eyes). [provider] Taking Active Self  carvedilol (COREG) 3.125 MG tablet [id number] Yes Take 3.125 mg by mouth 2 (two) times daily with a meal. [provider] Taking Active            Med Note Michaelle Birks, Laith Antonelli A   Wed Mar 11, 2021  1:26 PM) Prescribed by Dr. Nehemiah Massed  cetirizine (ZYRTEC) 10 MG tablet [id number] Yes Take 10 mg by mouth every other day as needed for allergies.  [provider] Taking Active Self           Med Note Larwance Sachs A   Thu May 06, 2016 12:00 PM)    Cholecalciferol (VITAMIN D-3) 1000 units CAPS [id number] Yes Take 1,000 Units by mouth daily.  [provider] Taking Active Self  famotidine (PEPCID) 20 MG tablet [id number] Yes Take 1 tablet (20 mg total) by mouth 2 (two) times daily. Steele Sizer, MD Taking Active   fluticasone [facility]) 50 MCG/ACT nasal spray [id number] Yes USE 2 SPRAYS IN EACH NOSTRIL EVERY Eloise Harman, MD Taking Active   furosemide (LASIX) 20 MG tablet [id number] Yes TAKE 1 TABLET EVERY DAY Steele Sizer, MD Taking Active   Lancets (ONETOUCH DELICA PLUS [id number]) MISC [id number] Yes CHECK GLUCOSE THREE TIMES DAILY [provider] Taking Active   lisinopril (ZESTRIL) 40 MG tablet [id number] Yes Take 1 tablet (40 mg total) by mouth at bedtime. Steele Sizer, MD Taking Active   meclizine (ANTIVERT) 25 MG tablet [id number] Yes Take 1 tablet (25 mg total) by mouth every 6 (six) hours. Steele Sizer, MD Taking Active   metFORMIN (GLUCOPHAGE-XR) 750 MG 24 hr tablet [id number] Yes Take 2 tablets (1,500 mg total) by mouth daily with breakfast. Steele Sizer, MD Taking Active   Multiple Vitamins-Minerals (MULTIVITAMIN PO) [id number] Yes Take 1 tablet by mouth daily. [provider] Taking Active Self  Omega-3 1000 MG CAPS [id number] Yes Take by mouth. [provider] Taking Active   pregabalin (LYRICA) 75 MG capsule [id number] Yes Take 1 capsule (75 mg total) by mouth 2 (two) times daily. Steele Sizer, MD Taking Active   SOLIQUA 100-33 UNT-MCG/ML SOPN [id number] Yes INJECT 10 TO 50 UNITS SUBCUTANEOUSLY ONCE DAILY Steele Sizer, MD Taking Active   tiZANidine (ZANAFLEX) 2 MG tablet [id number] Yes 1 po tid prn [provider] Taking Active   traMADol (ULTRAM) 50 MG tablet [id number] Yes Take 1 tablet by mouth 2 (two) times daily as needed.  Sharlet Salina, MD Taking Active   traZODone (DESYREL) 50 MG tablet [id number] Yes TAKE 1/2 TO 1 TABLET AT BEDTIME AS NEEDED FOR SLEEP Steele Sizer, MD Taking Active   Turmeric 500 MG CAPS [id number] Yes Take by mouth. [provider] Taking Active Self  vitamin B-12 (CYANOCOBALAMIN) 500 MCG tablet [id number] Yes Take 1,000 mcg by mouth daily. [provider] Taking Active Self            Patient Active Problem List   Diagnosis Date Noted   Bilateral leg edema 05/01/2020   SOBOE (shortness of breath on exertion) 05/01/2020   Colon cancer, ascending ([place]) 05/25/2019   Mild episode of recurrent major  depressive disorder ([place]) 08/01/2018   SBO (small bowel obstruction) and ileocolonic anastomosis from recent colectomy 05/22/2018   Hypomagnesemia 05/21/2018   Ileus following gastrointestinal surgery ([place]) 05/20/2018   Essential hemorrhagic thrombocythemia ([place]) 03/29/2018   Hypercalcemia 11/23/2015   DDD (degenerative disc disease), lumbar 07/28/2015   Spinal stenosis at L4-L5 level 06/12/2015  Benign essential HTN 05/25/2015   Bunion 05/25/2015   Cardiac enlargement 05/25/2015   Cervical radicular pain 05/25/2015   Back pain, chronic 05/25/2015   Osteoarthritis 05/25/2015   Dyslipidemia 05/25/2015   Gastric reflux 05/25/2015   Insomnia 05/25/2015   Eczema intertrigo 05/25/2015   Bronchitis, chronic, mucopurulent ([place]) 05/25/2015   Numerous moles 05/25/2015   Morbid obesity ([place]) 05/25/2015   Perennial allergic rhinitis 05/25/2015   DM (diabetes mellitus), type 2, uncontrolled, with renal complications ([place]) 28/78/6767   Snores 05/25/2015   History of artificial joint 05/25/2015   Degeneration of intervertebral disc of cervical region 02/06/2015   Neuritis or radiculitis due to rupture of lumbar intervertebral disc 01/14/2015   Abnormal presence of protein in urine 05/04/2010   Vitamin D deficiency 05/04/2010    Immunization History  Administered Date(s) Administered   Fluad Quad(high Dose 65+) 11/12/2019, 09/03/2020   Influenza, High Dose Seasonal PF 08/29/2015, 08/25/2016, 08/01/2018   Influenza, Seasonal, Injecte, Preservative Fre 08/03/2011, 08/21/2012   Influenza,inj,Quad PF,6+ Mos 08/13/2013, 07/17/2014   Influenza-Unspecified 07/17/2014, 08/31/2017   PFIZER(Purple Top)SARS-COV-2 Vaccination 01/08/2020, 01/29/2020, 08/27/2020   PPD Test 10/08/2015   Pneumococcal Conjugate-13 07/17/2014   Pneumococcal Polysaccharide-23 05/04/2010, 08/29/2015   Tdap 05/04/2010   Zoster Recombinat (Shingrix) 09/18/2018, 11/27/2018   Zoster, Live 12/25/2010    Conditions to be  addressed/monitored:  Hypertension, Hyperlipidemia, Diabetes, COPD, Depression, Anxiety and Chronic Pain, Insomnia  Care Plan : General Pharmacy (Adult)  Updates made by Germaine Pomfret, RPH since 07/14/2021 12:00 AM     Problem: Hypertension, Hyperlipidemia, Diabetes, COPD, Depression, Anxiety and Chronic Pain, Insomnia   Priority: High     Long-Range Goal: Patient-Specific Goal   Start Date: 02/05/2021  Expected End Date: 07/14/2022  This Visit's Progress: On track  Recent Progress: On track  Priority: High  Note:   Current Barriers:  Unable to independently afford treatment regimen Unable to achieve control of Diabetes   Pharmacist Clinical Goal(s):  Over the next 90 days, patient will achieve control of diabetes as evidenced by A1c less than 8% through collaboration with PharmD and provider.   Interventions: 1:1 collaboration with Steele Sizer, MD regarding development and update of comprehensive plan of care as evidenced by provider attestation and co-signature Inter-disciplinary care team collaboration (see longitudinal plan of care) Comprehensive medication review performed; medication list updated in electronic medical record  Diabetes (A1c goal <8%) -Controlled -Current medications: Soliqua 35 units at breakfast Metformin XR 750 mg 2 tablets daily  -Medications previously tried: Actos (hypoglycemia), glipizide, Invokana, Jardiance,   -Current home glucose readings  Fasting Evening Comments        125 100    147 117 Ate some popcorn   121 87    129 127    71 140    112 172    115     109     81     79     96     63     90    Average: 103 124    -Reports hypoglycemic symptoms: Woozy, weakness -Current dietary patterns: Patient feels she has been struggling with a low-carbohydrate diet for her diabetes. She reports some additional weight gain over the past few months.  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  -DECREASE Soliqua to 30 units daily   Hypertension (BP goal <140/90) -Controlled -Current treatment: Carvedilol 3.125 mg twice daily  Furosemide 20 mg daily  Lisinopril 40 mg daily  -Medications previously tried: NA  -Current home readings: 134/68, 128/74, 123/69, 132/70 -Denies hypotensive/hypertensive symptoms -Educated on Daily salt intake goal < 2300 mg; Importance of home blood pressure monitoring; -Counseled to monitor BP at home weekly, document, and provide log at future appointments -Recommended to continue current medication  Hyperlipidemia: (LDL goal < 70) -Controlled -Current treatment: Atorvastatin 80 mg daily  -Current antiplatelet treatment: Aspirin 81 mg daily  -Medications previously tried: NA  -Educated on Importance of limiting foods high in cholesterol; -Recommended to continue current medication  COPD (Goal: control symptoms and prevent exacerbations) -Controlled -Current treatment  Ventolin HFA 2 puffs every 6 hours as needed  Bevespi 2 puffs at bedtime  -Medications previously tried: NA  -Gold Grade: Gold 1 (FEV1>80%) -Current COPD Classification:  A (low sx, <2 exacerbations/yr) -MMRC/CAT score: NA -Pulmonary function testing: FEV1 92% (Jun 2021) -Exacerbations requiring treatment in last 6 months: None -Patient reports consistent use of maintenance inhaler -Frequency of rescue inhaler use: Infrequently -Counseled on Benefits of consistent maintenance inhaler use -Recommended to continue current medication  Depression/Anxiety (Goal: Maintain stable mood) -Controlled -Current treatment: Trazodone 50 mg 1/2 - 1 tablet nightly as needed - Sleep -Medications previously tried/failed: NA -PHQ9: 4 -GAD7: NA -Educated on Benefits of medication for symptom control -Recommended to continue current medication  Patient Goals/Self-Care Activities Over the next 90 days, patient will:  -  check glucose daily before breakfast, document, and provide at future appointments  Follow Up Plan: Telephone follow up appointment with care management team member scheduled for:  09/09/2021 at 8:00 AM       Medication Assistance: Application for Bevespi  medication assistance program. in process.  Anticipated assistance start date 02/12/21.  See plan of care for additional detail.  Patient's preferred pharmacy is:  [facility] - [place], [place] [address] Phone: [phone] Fax: [phone]  [facility] [address]), [place] - [place]) [place] [address] Phone: [phone] Fax: [phone]  Uses pill box? Yes Pt endorses 100% compliance  We discussed: Current pharmacy is preferred with insurance plan and patient is satisfied with pharmacy services Patient decided to: Continue current medication management strategy  Care Plan and Follow Up Patient Decision:  Patient agrees to Care Plan and Follow-up.  Plan: Telephone follow up appointment with care management team member scheduled for:  09/09/2021 at 8:00 AM  [facility] [phone]

## 2021-07-03 DIAGNOSIS — R200 Anesthesia of skin: Secondary | ICD-10-CM | POA: Diagnosis not present

## 2021-07-03 DIAGNOSIS — R202 Paresthesia of skin: Secondary | ICD-10-CM | POA: Diagnosis not present

## 2021-07-03 DIAGNOSIS — G5602 Carpal tunnel syndrome, left upper limb: Secondary | ICD-10-CM | POA: Diagnosis not present

## 2021-07-04 ENCOUNTER — Other Ambulatory Visit: Payer: Self-pay | Admitting: Family Medicine

## 2021-07-04 NOTE — Telephone Encounter (Signed)
Requested Prescriptions  Pending Prescriptions Disp Refills  . SOLIQUA 100-33 UNT-MCG/ML SOPN [Pharmacy Med Name: Willeen Niece 100-33 UNT-MCG/ML Subcutaneous Solution Pen-injector] 15 mL 0    Sig: INJECT 10 TO 50 UNITS SUBCUTANEOUSLY ONCE DAILY     Endocrinology: Diabetes - Insulin + GLP-1 Receptor Agonist Combos 2 Passed - 07/04/2021 12:37 PM      Passed - HBA1C is between 0 and 7.9 and within 180 days    Hemoglobin A1C  Date Value Ref Range Status  04/15/2021 6.6 (A) 4.0 - 5.6 % Final   HbA1c, POC (controlled diabetic range)  Date Value Ref Range Status  07/03/2019 9.3 (A) 0.0 - 7.0 % Final         Passed - Cr in normal range and within 360 days    Creat  Date Value Ref Range Status  12/10/2020 0.86 0.60 - 0.93 mg/dL Final    Comment:    For patients >21 years of age, the reference limit for Creatinine is approximately 13% higher for people identified as African-American. .    Creatinine, Urine  Date Value Ref Range Status  12/10/2020 12 (L) 20 - 275 mg/dL Final         Passed - AA eGFR in normal range and within 360 days    GFR, Est African American  Date Value Ref Range Status  12/10/2020 77 > OR = 60 mL/min/1.42m Final   GFR, Est Non African American  Date Value Ref Range Status  12/10/2020 67 > OR = 60 mL/min/1.7107mFinal         Passed - Valid encounter within last 6 months    Recent Outpatient Visits          2 months ago [facility] type 2 [facility]   6 months ago DM (diabetes mellitus), type 2, uncontrolled, [facility]   10 months ago DM (diabetes mellitus), type 2, uncontrolled, with [facility]   1 year ago DM (diabetes mellitus), type 2, uncontrolled, [facility] ([facility]   1 year ago DM  (diabetes mellitus), type 2, uncontrolled, with renal [facility] ([facility]  [facility]      Future Appointments            In 1 week  [facility] In 1 month SoSteele [facility]izerMD [facility]

## 2021-07-14 NOTE — Patient Instructions (Signed)
Visit Information It was great speaking with you today!  Please let me know if you have any questions about our visit.   Goals Addressed             This Visit's Progress    Monitor and Manage My Blood Sugar-Diabetes Type 2   On track    Timeframe:  Long-Range Goal Priority:  High Start Date:  02/05/2021                           Expected End Date:  08/08/2022                     Follow Up Date 09/09/2021    -Check blood sugar daily before breakfast - check blood sugar if I feel it is too high or too low - enter blood sugar readings and medication or insulin into daily log - take the blood sugar log to all doctor visits    Why is this important?   Checking your blood sugar at home helps to keep it from getting very high or very low.  Writing the results in a diary or log helps the doctor know how to care for you.  Your blood sugar log should have the time, date and the results.  Also, write down the amount of insulin or other medicine that you take.  Other information, like what you ate, exercise done and how you were feeling, will also be helpful.     Notes:         Patient Care Plan: General Pharmacy (Adult)     Problem Identified: Hypertension, Hyperlipidemia, Diabetes, COPD, Depression, Anxiety and Chronic Pain, Insomnia   Priority: High     Long-Range Goal: Patient-Specific Goal   Start Date: 02/05/2021  Expected End Date: 07/14/2022  This Visit's Progress: On track  Recent Progress: On track  Priority: High  Note:   Current Barriers:  Unable to independently afford treatment regimen Unable to achieve control of Diabetes   Pharmacist Clinical Goal(s):  Over the next 90 days, patient will achieve control of diabetes as evidenced by A1c less than 8% through collaboration with PharmD and provider.   Interventions: 1:1 collaboration with Steele Sizer, MD regarding development and update of comprehensive plan of care as evidenced by provider attestation and  co-signature Inter-disciplinary care team collaboration (see longitudinal plan of care) Comprehensive medication review performed; medication list updated in electronic medical record  Diabetes (A1c goal <8%) -Controlled -Current medications: Soliqua 35 units at breakfast Metformin XR 750 mg 2 tablets daily  -Medications previously tried: Actos (hypoglycemia), glipizide, Invokana, Jardiance,   -Current home glucose readings  Fasting Evening Comments        125 100    147 117 Ate some popcorn   121 87    129 127    71 140    112 172    115     109     81     79     96     63     90    Average: 103 124    -Reports hypoglycemic symptoms: Woozy, weakness -Current dietary patterns: Patient feels she has been struggling with a low-carbohydrate diet for her diabetes. She reports some additional weight gain over the past few months.  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  -  DECREASE Soliqua to 30 units daily   Hypertension (BP goal <140/90) -Controlled -Current treatment: Carvedilol 3.125 mg twice daily  Furosemide 20 mg daily  Lisinopril 40 mg daily  -Medications previously tried: NA  -Current home readings: 134/68, 128/74, 123/69, 132/70 -Denies hypotensive/hypertensive symptoms -Educated on Daily salt intake goal < 2300 mg; Importance of home blood pressure monitoring; -Counseled to monitor BP at home weekly, document, and provide log at future appointments -Recommended to continue current medication  Hyperlipidemia: (LDL goal < 70) -Controlled -Current treatment: Atorvastatin 80 mg daily  -Current antiplatelet treatment: Aspirin 81 mg daily  -Medications previously tried: NA  -Educated on Importance of limiting foods high in cholesterol; -Recommended to continue current medication  COPD (Goal: control symptoms and prevent exacerbations) -Controlled -Current  treatment  Ventolin HFA 2 puffs every 6 hours as needed  Bevespi 2 puffs at bedtime  -Medications previously tried: NA  -Gold Grade: Gold 1 (FEV1>80%) -Current COPD Classification:  A (low sx, <2 exacerbations/yr) -MMRC/CAT score: NA -Pulmonary function testing: FEV1 92% (Jun 2021) -Exacerbations requiring treatment in last 6 months: None -Patient reports consistent use of maintenance inhaler -Frequency of rescue inhaler use: Infrequently -Counseled on Benefits of consistent maintenance inhaler use -Recommended to continue current medication  Depression/Anxiety (Goal: Maintain stable mood) -Controlled -Current treatment: Trazodone 50 mg 1/2 - 1 tablet nightly as needed - Sleep -Medications previously tried/failed: NA -PHQ9: 4 -GAD7: NA -Educated on Benefits of medication for symptom control -Recommended to continue current medication  Patient Goals/Self-Care Activities Over the next 90 days, patient will:  - check glucose daily before breakfast, document, and provide at future appointments  Follow Up Plan: Telephone follow up appointment with care management team member scheduled for:  09/09/2021 at 8:00 AM      Patient agreed to services and verbal consent obtained.   Patient verbalizes understanding of instructions provided today and agrees to view in [place].   Cindy Griffin, [facility] [phone]

## 2021-07-15 ENCOUNTER — Other Ambulatory Visit: Payer: Self-pay | Admitting: Family Medicine

## 2021-07-16 ENCOUNTER — Other Ambulatory Visit: Payer: Self-pay

## 2021-07-16 ENCOUNTER — Ambulatory Visit (INDEPENDENT_AMBULATORY_CARE_PROVIDER_SITE_OTHER): Payer: Medicare HMO

## 2021-07-16 VITALS — BP 128/76 | HR 89 | Temp 98.3°F | Resp 16 | Ht 64.0 in | Wt 246.8 lb

## 2021-07-16 DIAGNOSIS — Z780 Asymptomatic menopausal state: Secondary | ICD-10-CM | POA: Diagnosis not present

## 2021-07-16 DIAGNOSIS — Z0000 Encounter for general adult medical examination without abnormal findings: Secondary | ICD-10-CM
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
## 2021-07-16 NOTE — Patient Instructions (Signed)
Cindy Griffin , Thank you for taking time to come for your Medicare Wellness Visit. I appreciate your ongoing commitment to your health goals. Please review the following plan we discussed and let me know if I can assist you in the future.   Screening recommendations/referrals: Colonoscopy: done 12/25/18. Due 12/26/2021 Mammogram: done 09/11/21. Scheduled for 09/14/21 Bone Density: done 09/11/19. Please call [phone] to schedule your bone density screening.  Recommended yearly ophthalmology/optometry visit for glaucoma screening and checkup Recommended yearly dental visit for hygiene and checkup  Vaccinations: Influenza vaccine: done 09/03/20 Pneumococcal vaccine: done 08/29/15 Tdap vaccine: due Shingles vaccine: done 09/18/18 & 11/27/18   Covid-19:done 01/08/20, 01/29/20, 08/27/20 & 03/19/21  Conditions/risks identified: Recommend increasing physical activity as tolerated.   Next appointment: Follow up in one year for your annual wellness visit    Preventive Care 65 Years and Older, Female Preventive care refers to lifestyle choices and visits with your health care provider that can promote health and wellness. What does preventive care include? A yearly physical exam. This is also called an annual well check. Dental exams once or twice a year. Routine eye exams. Ask your health care provider how often you should have your eyes checked. Personal lifestyle choices, including: Daily care of your teeth and gums. Regular physical activity. Eating a healthy diet. Avoiding tobacco and drug use. Limiting alcohol use. Practicing safe sex. Taking low-dose aspirin every day. Taking vitamin and mineral supplements as recommended by your health care provider. What happens during an annual well check? The services and screenings done by your health care provider during your annual well check will depend on your age, overall health, lifestyle risk factors, and family history of disease. Counseling   Your health care provider may ask you questions about your: Alcohol use. Tobacco use. Drug use. Emotional well-being. Home and relationship well-being. Sexual activity. Eating habits. History of falls. Memory and ability to understand (cognition). Work and work Statistician. Reproductive health. Screening  You may have the following tests or measurements: Height, weight, and BMI. Blood pressure. Lipid and cholesterol levels. These may be checked every 5 years, or more frequently if you are over 37 years old. Skin check. Lung cancer screening. You may have this screening every year starting at age 34 if you have a 30-pack-year history of smoking and currently smoke or have quit within the past 15 years. Fecal occult blood test (FOBT) of the stool. You may have this test every year starting at age 57. Flexible sigmoidoscopy or colonoscopy. You may have a sigmoidoscopy every 5 years or a colonoscopy every 10 years starting at age 75. Hepatitis C blood test. Hepatitis B blood test. Sexually transmitted disease (STD) testing. Diabetes screening. This is done by checking your blood sugar (glucose) after you have not eaten for a while (fasting). You may have this done every 1-3 years. Bone density scan. This is done to screen for osteoporosis. You may have this done starting at age 67. Mammogram. This may be done every 1-2 years. Talk to your health care provider about how often you should have regular mammograms. Talk with your health care provider about your test results, treatment options, and if necessary, the need for more tests. Vaccines  Your health care provider may recommend certain vaccines, such as: Influenza vaccine. This is recommended every year. Tetanus, diphtheria, and acellular pertussis (Tdap, Td) vaccine. You may need a Td booster every 10 years. Zoster vaccine. You may need this after age 17. Pneumococcal 13-valent conjugate (PCV13) vaccine.  One dose is recommended  after age 85. Pneumococcal polysaccharide (PPSV23) vaccine. One dose is recommended after age 85. Talk to your health care provider about which screenings and vaccines you need and how often you need them. This information is not intended to replace advice given to you by your health care provider. Make sure you discuss any questions you have with your health care provider. Document Released: 12/12/2015 Document Revised: 08/04/2016 Document Reviewed: 09/16/2015 Elsevier Interactive Patient Education  2017 [place] Prevention in the Home Falls can cause injuries. They can happen to people of all ages. There are many things you can do to make your home safe and to help prevent falls. What can I do on the outside of my home? Regularly fix the edges of walkways and driveways and fix any cracks. Remove anything that might make you trip as you walk through a door, such as a raised step or threshold. Trim any bushes or trees on the path to your home. Use bright outdoor lighting. Clear any walking paths of anything that might make someone trip, such as rocks or tools. Regularly check to see if handrails are loose or broken. Make sure that both sides of any steps have handrails. Any raised decks and porches should have guardrails on the edges. Have any leaves, snow, or ice cleared regularly. Use sand or salt on walking paths during winter. Clean up any spills in your garage right away. This includes oil or grease spills. What can I do in the bathroom? Use night lights. Install grab bars by the toilet and in the tub and shower. Do not use towel bars as grab bars. Use non-skid mats or decals in the tub or shower. If you need to sit down in the shower, use a plastic, non-slip stool. Keep the floor dry. Clean up any water that spills on the floor as soon as it happens. Remove soap buildup in the tub or shower regularly. Attach bath mats securely with double-sided non-slip rug tape. Do not  have throw rugs and other things on the floor that can make you trip. What can I do in the bedroom? Use night lights. Make sure that you have a light by your bed that is easy to reach. Do not use any sheets or blankets that are too big for your bed. They should not hang down onto the floor. Have a firm chair that has side arms. You can use this for support while you get dressed. Do not have throw rugs and other things on the floor that can make you trip. What can I do in the kitchen? Clean up any spills right away. Avoid walking on wet floors. Keep items that you use a lot in easy-to-reach places. If you need to reach something above you, use a strong step stool that has a grab bar. Keep electrical cords out of the way. Do not use floor polish or wax that makes floors slippery. If you must use wax, use non-skid floor wax. Do not have throw rugs and other things on the floor that can make you trip. What can I do with my stairs? Do not leave any items on the stairs. Make sure that there are handrails on both sides of the stairs and use them. Fix handrails that are broken or loose. Make sure that handrails are as long as the stairways. Check any carpeting to make sure that it is firmly attached to the stairs. Fix any carpet that is loose or  worn. Avoid having throw rugs at the top or bottom of the stairs. If you do have throw rugs, attach them to the floor with carpet tape. Make sure that you have a light switch at the top of the stairs and the bottom of the stairs. If you do not have them, ask someone to add them for you. What else can I do to help prevent falls? Wear shoes that: Do not have high heels. Have rubber bottoms. Are comfortable and fit you well. Are closed at the toe. Do not wear sandals. If you use a stepladder: Make sure that it is fully opened. Do not climb a closed stepladder. Make sure that both sides of the stepladder are locked into place. Ask someone to hold it for  you, if possible. Clearly mark and make sure that you can see: Any grab bars or handrails. First and last steps. Where the edge of each step is. Use tools that help you move around (mobility aids) if they are needed. These include: Canes. Walkers. Scooters. Crutches. Turn on the lights when you go into a dark area. Replace any light bulbs as soon as they burn out. Set up your furniture so you have a clear path. Avoid moving your furniture around. If any of your floors are uneven, fix them. If there are any pets around you, be aware of where they are. Review your medicines with your doctor. Some medicines can make you feel dizzy. This can increase your chance of falling. Ask your doctor what other things that you can do to help prevent falls. This information is not intended to replace advice given to you by your health care provider. Make sure you discuss any questions you have with your health care provider. Document Released: 09/11/2009 Document Revised: 04/22/2016 Document Reviewed: 12/20/2014 Elsevier Interactive Patient Education  2017 [employer].

## 2021-07-24 ENCOUNTER — Other Ambulatory Visit: Payer: Self-pay | Admitting: Family Medicine

## 2021-08-04 DIAGNOSIS — I10 Essential (primary) hypertension: Secondary | ICD-10-CM | POA: Diagnosis not present

## 2021-08-04 DIAGNOSIS — E782 Mixed hyperlipidemia: Secondary | ICD-10-CM | POA: Diagnosis not present

## 2021-08-04 DIAGNOSIS — R600 Localized edema: Secondary | ICD-10-CM | POA: Diagnosis not present

## 2021-08-06 ENCOUNTER — Telehealth: Payer: Self-pay

## 2021-08-06 NOTE — Progress Notes (Signed)
Chronic Care Management Pharmacy Assistant   Name: Cindy Griffin  MRN: [id number] DOB: 1946/05/20  Reason for Encounter:Diabetes  Disease State Call   Recent office visits:  No recent office visit  Recent consult visits:  07/03/2021 Cindy Hammers DO (Orthopedic Surgery) No Medication changes noted  Hospital visits:  None in previous 6 months  Medications: Outpatient Encounter Medications as of 08/06/2021  Medication Sig Note   acetaminophen (TYLENOL) 500 MG tablet Take 1,000 mg by mouth every 6 (six) hours as needed for moderate pain or headache.     albuterol (VENTOLIN HFA) 108 (90 Base) MCG/ACT inhaler Inhale 2 puffs into the lungs every 6 (six) hours as needed for wheezing or shortness of breath.    Ascorbic Acid (VITAMIN C) 1000 MG tablet Take 1,000 mg by mouth daily.    aspirin EC 81 MG tablet Take 1 tablet (81 mg total) by mouth daily.    atorvastatin (LIPITOR) 80 MG tablet Take 1 tablet (80 mg total) by mouth daily.    BD PEN NEEDLE MICRO U/F 32G X 6 MM MISC USE ONCE DAILY    BEVESPI AEROSPHERE 9-4.8 MCG/ACT AERO INHALE 2 PUFFS BY MOUTH AT BEDTIME    Biotin 5000 MCG CAPS Take by mouth.    Carboxymethylcellul-Glycerin (LUBRICATING EYE DROPS OP) Place 2 drops into both eyes 2 (two) times daily as needed (for dry eyes).    carvedilol (COREG) 3.125 MG tablet Take 3.125 mg by mouth 2 (two) times daily with a meal. 03/11/2021: Prescribed by Dr. Nehemiah Massed   cetirizine (ZYRTEC) 10 MG tablet Take 10 mg by mouth every other day as needed for allergies.     Cholecalciferol (VITAMIN D-3) 1000 units CAPS Take 1,000 Units by mouth daily.     famotidine (PEPCID) 20 MG tablet Take 1 tablet (20 mg total) by mouth 2 (two) times daily.    fluticasone (FLONASE) 50 MCG/ACT nasal spray USE 2 SPRAYS IN EACH NOSTRIL EVERY DAY    furosemide (LASIX) 20 MG tablet TAKE 1 TABLET EVERY DAY    Lancets (ONETOUCH DELICA PLUS [id number]) MISC CHECK GLUCOSE THREE TIMES DAILY    lisinopril (ZESTRIL) 40 MG  tablet Take 1 tablet (40 mg total) by mouth at bedtime.    meclizine (ANTIVERT) 25 MG tablet Take 1 tablet (25 mg total) by mouth every 6 (six) hours.    metFORMIN (GLUCOPHAGE-XR) 750 MG 24 hr tablet Take 2 tablets (1,500 mg total) by mouth daily with breakfast.    Multiple Vitamins-Minerals (MULTIVITAMIN PO) Take 1 tablet by mouth daily.    Omega-3 1000 MG CAPS Take by mouth.    pregabalin (LYRICA) 75 MG capsule Take 1 capsule (75 mg total) by mouth 2 (two) times daily.    SOLIQUA 100-33 UNT-MCG/ML SOPN INJECT 10 TO 50 UNITS SUBCUTANEOUSLY ONCE DAILY 07/16/2021: Pt taking 30 units daily 8.4.22   tiZANidine (ZANAFLEX) 2 MG tablet 1 po tid prn    traMADol (ULTRAM) 50 MG tablet Take 1 tablet by mouth 2 (two) times daily as needed.     traZODone (DESYREL) 50 MG tablet TAKE 1/2 TO 1 TABLET AT BEDTIME AS NEEDED FOR SLEEP    Turmeric 500 MG CAPS Take by mouth.    vitamin B-12 (CYANOCOBALAMIN) 500 MCG tablet Take 1,000 mcg by mouth daily.    No facility-administered encounter medications on file as of 08/06/2021.    Care Gaps: Tetanus Vaccine Influenza Vaccine COVID-19 Vaccine Star Rating Drugs: Atorvastatin 80 mg last filled on 05/12/2021 for 90 day  supply, No pharmacy noted Lisinopril 40 mg last filled on 05/12/2021 for 90 day supply, No pharmacy noted Metformin 750 mg last filled on 05/12/2021 for 90 day supply, No pharmacy noted Medication Fill Gaps: Pregabalin 75 MG capsule last filled 07/29/2020 90 day supply  Recent Relevant Labs: Lab Results  Component Value Date/Time   HGBA1C 6.6 (A) 04/15/2021 09:40 AM   HGBA1C 8.1 (A) 12/10/2020 09:23 AM   HGBA1C 9.3 (A) 07/03/2019 09:19 AM   HGBA1C 6.9 03/01/2019 10:05 AM   MICROALBUR <0.2 12/10/2020 12:00 AM   MICROALBUR 0.6 11/12/2019 12:00 AM   MICROALBUR 50 07/22/2017 09:00 AM   MICROALBUR 50 07/15/2016 10:52 AM    Kidney Function Lab Results  Component Value Date/Time   CREATININE 0.86 12/10/2020 12:00 AM   CREATININE 0.85 09/09/2020  01:08 PM   CREATININE 0.98 06/23/2020 01:26 PM   CREATININE 0.76 11/12/2019 12:00 AM   GFRNONAA 67 12/10/2020 12:00 AM   GFRAA 77 12/10/2020 12:00 AM    Current antihyperglycemic regimen:  Soliqua 30 units at breakfast Metformin XR 750 mg 2 tablets daily  What recent interventions/DTPs have been made to improve glycemic control:  07/02/2021 Cindy Griffin RPH ([facility]) Decrease Soliqua to 30 units daily Have there been any recent hospitalizations or ED visits since last visit with CPP? No Patient reports hypoglycemic symptoms, including Pale, Sweaty, Shaky, and Hungry Patient states she has hypoglycemic/hyperglycemic symptoms when she waits to long eat. Patient reports hyperglycemic symptoms, including fatigue and weakness How often are you checking your blood sugar? twice daily What are your blood sugars ranging?  Patient did not give the date/if she ate or not. 91,117,87,159,104,112,122,180(patient reports having ice cream the nigh prior),68 Patient check her blood sugar while on the phone  with me, and it was 71.Patient states her blood sugar is low because she has not had her lunch yet. During the week, how often does your blood glucose drop below 70? Once a week Are you checking your feet daily/regularly?   Patient reports she has neuropathy, but denies any new numbness,pain or tingling in her feet.  Adherence Review: Is the patient currently on a STATIN medication? Yes Is the patient currently on ACE/ARB medication? Yes Does the patient have >5 day gap between last estimated fill dates? No   [name] Clinical Production designer, theatre/television/film [phone]
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
## 2021-08-17 ENCOUNTER — Telehealth: Payer: Self-pay | Admitting: Gastroenterology

## 2021-08-17 ENCOUNTER — Ambulatory Visit: Payer: Medicare HMO | Admitting: Family Medicine

## 2021-08-17 DIAGNOSIS — E11649 Type 2 diabetes mellitus with hypoglycemia without coma: Secondary | ICD-10-CM

## 2021-08-17 NOTE — Telephone Encounter (Signed)
Pt. Requesting a call back. She wants to know when her next colonoscopy is due. She said she can not remember when she is supposed to schedule it

## 2021-08-18 NOTE — Telephone Encounter (Signed)
Colonoscopy is not due till 11/2021. Informed patient to call back the first of the year to get schedule
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
## 2021-08-21 ENCOUNTER — Other Ambulatory Visit: Payer: Self-pay

## 2021-08-21 ENCOUNTER — Encounter: Payer: Self-pay | Admitting: Family Medicine

## 2021-08-21 ENCOUNTER — Ambulatory Visit (INDEPENDENT_AMBULATORY_CARE_PROVIDER_SITE_OTHER): Payer: Medicare HMO | Admitting: Family Medicine

## 2021-08-21 VITALS — BP 128/74 | HR 81 | Temp 98.0°F | Resp 18 | Ht 64.0 in | Wt 248.7 lb

## 2021-08-21 DIAGNOSIS — E785 Hyperlipidemia, unspecified: Secondary | ICD-10-CM

## 2021-08-21 DIAGNOSIS — E1169 Type 2 diabetes mellitus with other specified complication: Secondary | ICD-10-CM | POA: Diagnosis not present

## 2021-08-21 DIAGNOSIS — Z794 Long term (current) use of insulin: Secondary | ICD-10-CM

## 2021-08-21 DIAGNOSIS — K219 Gastro-esophageal reflux disease without esophagitis: Secondary | ICD-10-CM

## 2021-08-21 DIAGNOSIS — Z23 Encounter for immunization: Secondary | ICD-10-CM | POA: Diagnosis not present

## 2021-08-21 DIAGNOSIS — I152 Hypertension secondary to endocrine disorders: Secondary | ICD-10-CM

## 2021-08-21 DIAGNOSIS — I10 Essential (primary) hypertension: Secondary | ICD-10-CM

## 2021-08-21 DIAGNOSIS — E11649 Type 2 diabetes mellitus with hypoglycemia without coma: Secondary | ICD-10-CM

## 2021-08-21 DIAGNOSIS — E538 Deficiency of other specified B group vitamins: Secondary | ICD-10-CM | POA: Diagnosis not present

## 2021-08-21 DIAGNOSIS — J411 Mucopurulent chronic bronchitis: Secondary | ICD-10-CM | POA: Diagnosis not present

## 2021-08-21 DIAGNOSIS — F330 Major depressive disorder, recurrent, mild: Secondary | ICD-10-CM | POA: Diagnosis not present

## 2021-08-21 DIAGNOSIS — E559 Vitamin D deficiency, unspecified: Secondary | ICD-10-CM

## 2021-08-21 DIAGNOSIS — E1159 Type 2 diabetes mellitus with other circulatory complications: Secondary | ICD-10-CM

## 2021-08-21 DIAGNOSIS — D692 Other nonthrombocytopenic purpura: Secondary | ICD-10-CM

## 2021-08-21 LAB — POCT GLYCOSYLATED HEMOGLOBIN (HGB A1C): Hemoglobin A1C: 7.5 % — AB (ref 4.0–5.6)

## 2021-08-21 MED ORDER — ALBUTEROL SULFATE HFA 108 (90 BASE) MCG/ACT IN AERS: 2.0000 | INHALATION_SPRAY | Freq: Four times a day (QID) | RESPIRATORY_TRACT | 0 refills | Status: DC | PRN

## 2021-08-26 ENCOUNTER — Other Ambulatory Visit: Payer: Self-pay | Admitting: Family Medicine

## 2021-08-31 ENCOUNTER — Telehealth: Payer: Self-pay

## 2021-08-31 NOTE — Progress Notes (Signed)
Chronic Care Management Pharmacy Assistant   Name: FATIN BACHICHA  MRN: [id number] DOB: 07-08-1946  Reason for Encounter: Medication Review/Patient Assistance Trulicity and Jardiance   Recent office visits:  08/21/2021 Dr.Sowles MD (PCP) No Medication Changes Noted  Recent consult visits:  No recent [facility] visits:  None in previous 6 months  Medications: Outpatient Encounter Medications as of 08/31/2021  Medication Sig Note   acetaminophen (TYLENOL) 500 MG tablet Take 1,000 mg by mouth every 6 (six) hours as needed for moderate pain or headache.     albuterol (VENTOLIN HFA) 108 (90 Base) MCG/ACT inhaler Inhale 2 puffs into the lungs every 6 (six) hours as needed for wheezing or shortness of breath.    Ascorbic Acid (VITAMIN C) 1000 MG tablet Take 1,000 mg by mouth daily.    aspirin EC 81 MG tablet Take 1 tablet (81 mg total) by mouth daily.    atorvastatin (LIPITOR) 80 MG tablet Take 1 tablet (80 mg total) by mouth daily.    BD PEN NEEDLE MICRO U/F 32G X 6 MM MISC USE ONCE DAILY    BEVESPI AEROSPHERE 9-4.8 MCG/ACT AERO INHALE 2 PUFFS BY MOUTH AT BEDTIME    Biotin 5000 MCG CAPS Take by mouth.    Carboxymethylcellul-Glycerin (LUBRICATING EYE DROPS OP) Place 2 drops into both eyes 2 (two) times daily as needed (for dry eyes).    carvedilol (COREG) 3.125 MG tablet Take 3.125 mg by mouth 2 (two) times daily with a meal. 03/11/2021: Prescribed by Dr. Nehemiah Massed   cetirizine (ZYRTEC) 10 MG tablet Take 10 mg by mouth every other day as needed for allergies.     Cholecalciferol (VITAMIN D-3) 1000 units CAPS Take 1,000 Units by mouth daily.     famotidine (PEPCID) 20 MG tablet Take 1 tablet (20 mg total) by mouth 2 (two) times daily.    fluticasone (FLONASE) 50 MCG/ACT nasal spray USE 2 SPRAYS IN EACH NOSTRIL EVERY DAY    furosemide (LASIX) 20 MG tablet TAKE 1 TABLET EVERY DAY    Lancets (ONETOUCH DELICA PLUS [id number]) MISC CHECK GLUCOSE THREE TIMES DAILY    lisinopril  (ZESTRIL) 40 MG tablet Take 1 tablet (40 mg total) by mouth at bedtime.    meclizine (ANTIVERT) 25 MG tablet Take 1 tablet (25 mg total) by mouth every 6 (six) hours.    metFORMIN (GLUCOPHAGE-XR) 750 MG 24 hr tablet Take 2 tablets (1,500 mg total) by mouth daily with breakfast.    Multiple Vitamins-Minerals (MULTIVITAMIN PO) Take 1 tablet by mouth daily.    Omega-3 1000 MG CAPS Take by mouth.    pregabalin (LYRICA) 75 MG capsule Take 1 capsule (75 mg total) by mouth 2 (two) times daily.    SOLIQUA 100-33 UNT-MCG/ML SOPN INJECT 10 TO 50 UNITS SUBCUTANEOUSLY ONCE DAILY    tiZANidine (ZANAFLEX) 2 MG tablet 1 po tid prn    traMADol (ULTRAM) 50 MG tablet Take 1 tablet by mouth 2 (two) times daily as needed.     traZODone (DESYREL) 50 MG tablet TAKE 1/2 TO 1 TABLET AT BEDTIME AS NEEDED FOR SLEEP    Turmeric 500 MG CAPS Take by mouth.    vitamin B-12 (CYANOCOBALAMIN) 500 MCG tablet Take 1,000 mcg by mouth daily.    No facility-administered encounter medications on file as of 08/31/2021.    Care Gaps: Tetanus Vaccine COVID-19 Vaccine Star Rating Drugs: Atorvastatin 80 mg last filled on 05/12/2021 for 90 day supply, No pharmacy noted Lisinopril 40 mg last  filled on 05/12/2021 for 90 day supply, No pharmacy noted Metformin 750 mg last filled on 05/12/2021 for 90 day supply, No pharmacy noted Medication Fill Gaps: Pregabalin 75 MG capsule last filled 07/29/2020 90 day supply  I received a task from Junius Argyle, CPP requesting that I start the application for patient assistance on the medications of Trulicity and Jardiance.    Left Voice message to inform patient  she  will receive the two application in the mailed.I informed  her once she receives the application she will need to complete her part of the application and return it to her PCP office for Junius Argyle, CPP to fax over to the [facility] for processing.Informed patient to include a copy of her proof of income AND a copy of her  Explanation of Benefits (EOB) statement from her insurance. The application will need to be faxed to [phone] [facility]) and [phone] v([place] [facility] -Trulicity) and the phone number that can be called to check the status of the application will be [phone] [facility]) [phone] [facility]). I provided my phone number of [phone]  to return my call if she has any questions.   Application emailed to Junius Argyle, CPP for review and to Mail to Patient.  [place] Pharmacist Assistant [phone]

## 2021-08-31 NOTE — Telephone Encounter (Signed)
-----   Message from Germaine Pomfret, [facility] sent at 08/26/2021 11:27 AM EDT ----- Regarding: FW: medication assistance Bessie, can we please start working on PAP for two new medications for patient?  Trulicity 1.5 mg weekly  Jardiance 25 mg daily   Thanks!  ----- Message ----- From: Steele Sizer, MD Sent: 08/21/2021  10:22 AM EDT To: Germaine Pomfret, RPH Subject: medication assistance                          Patient is taking soliqua and metformin Would she qualify for Trulicity and Jardiance / Farxiga instead? It may help with weight loss and she states Willeen Niece is costing too much now

## 2021-09-09 ENCOUNTER — Ambulatory Visit (INDEPENDENT_AMBULATORY_CARE_PROVIDER_SITE_OTHER): Payer: Medicare HMO

## 2021-09-09 ENCOUNTER — Telehealth: Payer: Self-pay

## 2021-09-09 DIAGNOSIS — I10 Essential (primary) hypertension: Secondary | ICD-10-CM

## 2021-09-09 DIAGNOSIS — Z794 Long term (current) use of insulin: Secondary | ICD-10-CM

## 2021-09-09 NOTE — Progress Notes (Addendum)
Per Clinical Pharmacist, I had my visit with Cindy Griffin today, she stopped by and dropped off her PAP for Trulicity, but not for Jardiance. Can you give her a call soon and see if she still has it and can drop it off or mail it back to the clinic.  Patient states she will drop it off at the front desk today for the clinical pharmacist.  [name] Clinical Pharmacist Assistant [phone]    Addendum 09/16/21:    [place] Patient Assistance form for Trulicity completed by patient and faxed for review on 09/16/21 [facility] Cares Patient Assistance form for Jardiance completed by patient and faxed for review on 09/16/21  Malva Limes, [facility] [phone]

## 2021-09-09 NOTE — Progress Notes (Signed)
Chronic Care Management Pharmacy Note  09/09/2021 Name:  Cindy Griffin MRN:  [id number] DOB:  04-18-1946  Summary: Patient presents for CCM follow-up. She has been under stress recently. She continues to have some hypoglycemia episodes due to not eating.   Recommendations/Changes made from today's visit: -Continue current medications.  -Patient to return patient assistance for Trulicity and Jardiance   Plan: CPP follow-up 2 months  Recommended Problem List Changes:   Modify: Type 2 diabetes mellitus with diabetic polyneuropathy, with long-term current use of insulin ([place])    Subjective: Cindy Griffin is an 75 y.o. year old female who is a primary patient of Steele Sizer, MD.  The CCM team was consulted for assistance with disease management and care coordination needs.    Engaged with patient by telephone for follow up visit in response to provider referral for pharmacy case management and/or care coordination services.   Consent to Services:  The patient was given information about Chronic Care Management services, agreed to services, and gave verbal consent prior to initiation of services.  Please see initial visit note for detailed documentation.   Patient Care Team: Steele Sizer, MD as PCP - General (Family Medicine) Sharlet Salina, MD as Consulting Physician (Physical Medicine and Rehabilitation) Merlene Morse, MD as Consulting Physician (Orthopedic Surgery) Ileana Roup, MD as Consulting Physician (General Surgery) Germaine Pomfret, [facility] as Pharmacist (Pharmacist) Corey Skains, MD as Consulting Physician (Cardiology) Erby Pian, MD as Referring Physician (Pulmonary Disease) Cammie Sickle, MD as Consulting Physician (Hematology and Oncology)  Recent office visits: 08/21/21: Patient presented to Dr. Ancil Boozer for follow-up. A1c 7.5%. BP 128/74.  07/16/21: Patient presented to Clemetine Marker, LPN for AWV.  Recent consult  visits: 08/04/21: Patient presented to Dr. Nehemiah Massed (Cardiology) for follow-up. Carvedilol increased to 6.25 mg twice daily.   Hospital visits: None in previous 6 months  Objective:  Lab Results  Component Value Date   CREATININE 0.86 12/10/2020   BUN 15 12/10/2020   GFRNONAA 67 12/10/2020   GFRAA 77 12/10/2020   NA 141 12/10/2020   K 4.4 12/10/2020   CALCIUM 10.3 12/10/2020   CO2 28 12/10/2020    Lab Results  Component Value Date/Time   HGBA1C 7.5 (A) 08/21/2021 10:14 AM   HGBA1C 6.6 (A) 04/15/2021 09:40 AM   HGBA1C 9.3 (A) 07/03/2019 09:19 AM   HGBA1C 6.9 03/01/2019 10:05 AM   FRUCTOSAMINE 264 12/27/2017 09:56 AM   MICROALBUR <0.2 12/10/2020 12:00 AM   MICROALBUR 0.6 11/12/2019 12:00 AM   MICROALBUR 50 07/22/2017 09:00 AM   MICROALBUR 50 07/15/2016 10:52 AM    Last diabetic Eye exam:  Lab Results  Component Value Date/Time   HMDIABEYEEXA No Retinopathy 10/08/2020 12:00 AM    Last diabetic Foot exam: No results found for: HMDIABFOOTEX   Lab Results  Component Value Date   CHOL 127 12/10/2020   HDL 56 12/10/2020   LDLCALC 52 12/10/2020   TRIG 104 12/10/2020   CHOLHDL 2.3 12/10/2020    Hepatic Function Latest Ref Rng & Units 12/10/2020 06/23/2020 12/21/2019  Total Protein 6.1 - 8.1 g/dL 7.4 7.4 7.3  Albumin 3.5 - 5.0 g/dL - 4.0 4.2  AST 10 - 35 U/L 15 22 22   ALT 6 - 29 U/L 15 18 20   Alk Phosphatase 38 - 126 U/L - 76 77  Total Bilirubin 0.2 - 1.2 mg/dL 0.3 0.5 0.6    No results found for: TSH, FREET4  CBC Latest Ref Rng & Units 12/10/2020  09/09/2020 06/23/2020  WBC 3.8 - 10.8 Thousand/uL 6.7 6.7 7.3  Hemoglobin 11.7 - 15.5 g/dL 13.2 12.3 11.8(L)  Hematocrit 35.0 - 45.0 % 39.2 38.8 36.0  Platelets 140 - 400 Thousand/uL 344 292 282    Lab Results  Component Value Date/Time   VD25OH 30 12/10/2020 12:00 AM   VD25OH 51 10/31/2018 12:06 PM    Clinical ASCVD: No  The ASCVD Risk score (Arnett DK, et al., 2019) failed to calculate for the following reasons:    The valid total cholesterol range is 130 to 320 mg/dL    Depression screen [facility] 2/9 08/21/2021 07/16/2021 04/15/2021  Decreased Interest 0 0 0  Down, Depressed, Hopeless 0 1 0  PHQ - 2 Score 0 1 0  Altered sleeping 2 1 0  Tired, decreased energy 1 1 1   Change in appetite 1 0 1  Feeling bad or failure about yourself  0 0 0  Trouble concentrating 0 1 0  Moving slowly or fidgety/restless 0 0 0  Suicidal thoughts 0 0 0  PHQ-9 Score 4 4 2   Difficult doing work/chores Not difficult at all Not difficult at all -  Some recent data might be hidden    Social History   Tobacco Use  Smoking Status Never  Smokeless Tobacco Never  Tobacco Comments   smoking cessation materials not required   BP Readings from Last 3 Encounters:  08/21/21 128/74  07/16/21 128/76  06/26/21 132/70   Pulse Readings from Last 3 Encounters:  08/21/21 81  07/16/21 89  06/26/21 80   Wt Readings from Last 3 Encounters:  08/21/21 248 lb 11.2 oz (112.8 kg)  07/16/21 246 lb 12.8 oz (111.9 kg)  06/26/21 250 lb (113.4 kg)    Assessment/Interventions: Review of patient past medical history, allergies, medications, health status, including review of consultants reports, laboratory and other test data, was performed as part of comprehensive evaluation and provision of chronic care management services.   SDOH:  (Social Determinants of Health) assessments and interventions performed: Yes SDOH Interventions    Flowsheet Row Most Recent Value  SDOH Interventions   Financial Strain Interventions Other (Comment)  [PAP]         CCM Care Plan  Allergies  Allergen Reactions   Doxycycline Rash    Itching    Medications Reviewed Today     Reviewed by Steele Sizer, MD (Physician) on 08/21/21 at 15  Med List Status: <None>   Medication Order Taking? Sig Documenting Provider Last Dose Status Informant  acetaminophen (TYLENOL) 500 MG tablet [id number] Yes Take 1,000 mg by mouth every 6 (six) hours as needed for  moderate pain or headache.  [provider] Taking Active Self  albuterol (VENTOLIN HFA) 108 (90 Base) MCG/ACT inhaler [id number] Yes Inhale 2 puffs into the lungs every 6 (six) hours as needed for wheezing or shortness of breath. Steele Sizer, MD Taking Active   Ascorbic Acid (VITAMIN C) 1000 MG tablet [id number] Yes Take 1,000 mg by mouth daily. [provider] Taking Active Self  aspirin EC 81 MG tablet [id number] Yes Take 1 tablet (81 mg total) by mouth daily. Steele Sizer, MD Taking Active Self  atorvastatin (LIPITOR) 80 MG tablet [id number] Yes Take 1 tablet (80 mg total) by mouth daily. Steele Sizer, MD Taking Active   BD PEN NEEDLE MICRO U/F 32G X 6 MM MISC [id number] Yes USE ONCE DAILY Steele Sizer, MD Taking Active   BEVESPI AEROSPHERE 9-4.8 MCG/ACT AERO [id number] Yes INHALE 2 PUFFS BY MOUTH  AT BEDTIME Steele Sizer, MD Taking Active   Biotin 5000 MCG CAPS [id number] Yes Take by mouth. [provider] Taking Active Self  Carboxymethylcellul-Glycerin (LUBRICATING EYE DROPS OP) [id number] Yes Place 2 drops into both eyes 2 (two) times daily as needed (for dry eyes). [provider] Taking Active Self  carvedilol (COREG) 3.125 MG tablet [id number] Yes Take 3.125 mg by mouth 2 (two) times daily with a meal. [provider] Taking Active            Med Note Michaelle Birks, Abrielle Finck A   Wed Mar 11, 2021  1:26 PM) Prescribed by Dr. Nehemiah Massed  cetirizine (ZYRTEC) 10 MG tablet [id number] Yes Take 10 mg by mouth every other day as needed for allergies.  [provider] Taking Active Self           Med Note Larwance Sachs A   Thu May 06, 2016 12:00 PM)    Cholecalciferol (VITAMIN D-3) 1000 units CAPS [id number] Yes Take 1,000 Units by mouth daily.  [provider] Taking Active Self  famotidine (PEPCID) 20 MG tablet [id number] Yes Take 1 tablet (20 mg total) by mouth 2 (two) times daily. Steele Sizer, MD Taking Active   fluticasone  [facility]) 50 MCG/ACT nasal spray [id number] Yes USE 2 SPRAYS IN EACH NOSTRIL EVERY Eloise Harman, MD Taking Active   furosemide (LASIX) 20 MG tablet [id number] Yes TAKE 1 TABLET EVERY DAY Steele Sizer, MD Taking Active   Lancets (ONETOUCH DELICA PLUS OEUMPN36R) [place] [id number] Yes CHECK GLUCOSE THREE TIMES DAILY [provider] Taking Active   lisinopril (ZESTRIL) 40 MG tablet [id number] Yes Take 1 tablet (40 mg total) by mouth at bedtime. Steele Sizer, MD Taking Active   meclizine (ANTIVERT) 25 MG tablet [id number] Yes Take 1 tablet (25 mg total) by mouth every 6 (six) hours. Steele Sizer, MD Taking Active   metFORMIN (GLUCOPHAGE-XR) 750 MG 24 hr tablet [id number] Yes Take 2 tablets (1,500 mg total) by mouth daily with breakfast. Steele Sizer, MD Taking Active   Multiple Vitamins-Minerals (MULTIVITAMIN PO) [id number] Yes Take 1 tablet by mouth daily. [provider] Taking Active Self  Omega-3 1000 MG CAPS [id number] Yes Take by mouth. [provider] Taking Active   pregabalin (LYRICA) 75 MG capsule [id number] Yes Take 1 capsule (75 mg total) by mouth 2 (two) times daily. Steele Sizer, MD Taking Active   SOLIQUA 100-33 UNT-MCG/ML SOPN [id number] Yes INJECT 10 TO 50 UNITS SUBCUTANEOUSLY ONCE DAILY Steele Sizer, MD Taking Active            Med Note Clemetine Marker D   Thu Jul 16, 2021  3:40 PM) Pt taking 30 units daily 8.4.22  tiZANidine (ZANAFLEX) 2 MG tablet [id number] Yes 1 po tid prn [provider] Taking Active   traMADol (ULTRAM) 50 MG tablet [id number] Yes Take 1 tablet by mouth 2 (two) times daily as needed.  Sharlet Salina, MD Taking Active   traZODone (DESYREL) 50 MG tablet [id number] Yes TAKE 1/2 TO 1 TABLET AT BEDTIME AS NEEDED FOR SLEEP Steele Sizer, MD Taking Active   Turmeric 500 MG CAPS [id number] Yes Take by mouth. [provider] Taking Active Self  vitamin B-12 (CYANOCOBALAMIN) 500 MCG tablet [id number] Yes Take 1,000 mcg  by mouth daily. [provider] Taking Active Self            Patient Active Problem List   Diagnosis Date Noted   Bilateral leg edema 05/01/2020   SOBOE (shortness of breath on exertion)  05/01/2020   Colon cancer, ascending ([place]) 05/25/2019   Mild episode of recurrent major depressive disorder ([place]) 08/01/2018   SBO (small bowel obstruction) and ileocolonic anastomosis from recent colectomy 05/22/2018   Hypomagnesemia 05/21/2018   Ileus following gastrointestinal surgery ([place]) 05/20/2018   Essential hemorrhagic thrombocythemia ([place]) 03/29/2018   Hypercalcemia 11/23/2015   DDD (degenerative disc disease), lumbar 07/28/2015   Spinal stenosis at L4-L5 level 06/12/2015   Benign essential HTN 05/25/2015   Bunion 05/25/2015   Cardiac enlargement 05/25/2015   Cervical radicular pain 05/25/2015   Back pain, chronic 05/25/2015   Osteoarthritis 05/25/2015   Dyslipidemia 05/25/2015   Gastric reflux 05/25/2015   Insomnia 05/25/2015   Eczema intertrigo 05/25/2015   Bronchitis, chronic, mucopurulent ([place]) 05/25/2015   Numerous moles 05/25/2015   Morbid obesity ([place]) 05/25/2015   Perennial allergic rhinitis 05/25/2015   DM (diabetes mellitus), type 2, uncontrolled, with renal complications 27/25/3664   Snores 05/25/2015   History of artificial joint 05/25/2015   Degeneration of intervertebral disc of cervical region 02/06/2015   Neuritis or radiculitis due to rupture of lumbar intervertebral disc 01/14/2015   Abnormal presence of protein in urine 05/04/2010   Vitamin D deficiency 05/04/2010    Immunization History  Administered Date(s) Administered   Fluad Quad(high Dose 65+) 11/12/2019, 09/03/2020, 08/21/2021   Influenza, High Dose Seasonal PF 08/29/2015, 08/25/2016, 08/01/2018   Influenza, Seasonal, Injecte, Preservative Fre 08/03/2011, 08/21/2012   Influenza,inj,Quad PF,6+ Mos 08/13/2013, 07/17/2014   Influenza-Unspecified 07/17/2014, 08/31/2017   PFIZER(Purple  Top)SARS-COV-2 Vaccination 01/08/2020, 01/29/2020, 08/27/2020, 03/19/2021   PPD Test 10/08/2015   Pneumococcal Conjugate-13 07/17/2014   Pneumococcal Polysaccharide-23 05/04/2010, 08/29/2015   Tdap 05/04/2010   Zoster Recombinat (Shingrix) 09/18/2018, 11/27/2018   Zoster, Live 12/25/2010    Conditions to be addressed/monitored:  Hypertension, Hyperlipidemia, Diabetes, COPD, Depression, Anxiety and Chronic Pain, Insomnia  Care Plan : General Pharmacy (Adult)  Updates made by Germaine Pomfret, RPH since 09/09/2021 12:00 AM     Problem: Hypertension, Hyperlipidemia, Diabetes, COPD, Depression, Anxiety and Chronic Pain, Insomnia   Priority: High     Long-Range Goal: Patient-Specific Goal   Start Date: 02/05/2021  Expected End Date: 07/14/2022  This Visit's Progress: On track  Recent Progress: On track  Priority: High  Note:   Current Barriers:  Unable to independently afford treatment regimen Unable to achieve control of Diabetes   Pharmacist Clinical Goal(s):  Over the next 90 days, patient will achieve control of diabetes as evidenced by A1c less than 8% through collaboration with PharmD and provider.   Interventions: 1:1 collaboration with Steele Sizer, MD regarding development and update of comprehensive plan of care as evidenced by provider attestation and co-signature Inter-disciplinary care team collaboration (see longitudinal plan of care) Comprehensive medication review performed; medication list updated in electronic medical record  Diabetes (A1c goal <8%) -Controlled -Current medications: Soliqua 30 units at breakfast Metformin XR 750 mg 2 tablets daily  -Medications previously tried: Actos (hypoglycemia), glipizide, Invokana, Jardiance,   -Current home glucose readings  Fasting Lunchtime Evening  12-Oct 117    11-Oct 98    10-Oct  159   9-Oct  87   8-Oct 91    7-Oct 87    6-Oct   154  5-Oct  113   4-Oct 91    3-Oct 103    2-Oct   165  1-Oct   81   30-Sep 98    29-Sep 128 192 (cake)   28-Sep 110    27-Sep 90  26-Sep 86    Average 100    -Reports hypoglycemic symptoms: Woozy, weakness. Occurs when she hasn't eaten anything.  -Current dietary patterns: Patient feels she has been struggling with a low-carbohydrate diet for her diabetes. She reports some additional weight gain over the past few months.  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 -Willeen Niece has been challenging to afford while patient is in donut hole. Patient was amenable to trying to see if she qualifies for patient assistance for Trulicity and Jardiance.  -Continue medications for now. Anticipate stopping [place] after Trulicity and Jardiance approved.    Hypertension (BP goal <140/90) -Controlled -Current treatment: Carvedilol 6.25 mg twice daily  Furosemide 20 mg daily  Lisinopril 40 mg daily  -Medications previously tried: NA  -Current home readings: NA -Denies hypotensive/hypertensive symptoms -Carvedilol increased by cardiology to address edema, patient feels it is unchanged. Counseled to ensure she is elevating legs, minimizing salt intake, and using compression stockings.  -Recommended to continue current medication  Hyperlipidemia: (LDL goal < 70) -Controlled -Current treatment: Atorvastatin 80 mg daily  -Current antiplatelet treatment: Aspirin 81 mg daily  -Medications previously tried: NA  -Educated on Importance of limiting foods high in cholesterol; -Recommended to continue current medication  COPD (Goal: control symptoms and prevent exacerbations) -Controlled -Current treatment  Ventolin HFA 2 puffs every 6 hours as needed  Bevespi 2 puffs at bedtime  -Medications previously tried: NA  -Gold Grade: Gold 1 (FEV1>80%) -Current COPD Classification:  A (low sx, <2 exacerbations/yr) -MMRC/CAT score: NA -Pulmonary function testing: FEV1 92% (Jun  2021) -Exacerbations requiring treatment in last 6 months: None -Patient reports consistent use of maintenance inhaler -Frequency of rescue inhaler use: Infrequently -Counseled on Benefits of consistent maintenance inhaler use -Recommended to continue current medication  Insomnia (Goal: Improve sleep quality) -Controlled -Current treatment: Trazodone 50 mg 1/2 - 1 tablet nightly as needed - Sleep -Medications previously tried/failed: NA -PHQ9: 4 -GAD7: NA -Educated on Benefits of medication for symptom control -Recommended to continue current medication  Patient Goals/Self-Care Activities Over the next 90 days, patient will:  - check glucose daily before breakfast, document, and provide at future appointments  Follow Up Plan: Telephone follow up appointment with care management team member scheduled for:  11/25/2021 at 9:15 AM     Medication Assistance: Application for Bevespi  medication assistance program. in process.  Anticipated assistance start date 02/12/21.  See plan of care for additional detail.  Patient's preferred pharmacy is:  [facility] [place], [place] [address] Phone: [phone] Fax: [phone]  [facility] [address]), [place] - [place]) [place] [address] Phone: [phone] Fax: [phone]  Uses pill box? Yes Pt endorses 100% compliance  We discussed: Current pharmacy is preferred with insurance plan and patient is satisfied with pharmacy services Patient decided to: Continue current medication management strategy  Care Plan and Follow Up Patient Decision:  Patient agrees to Care Plan and Follow-up.  Plan: Telephone follow up appointment with care management team member scheduled for:  11/25/2021 at [facility] [phone]

## 2021-09-09 NOTE — Patient Instructions (Signed)
Visit Information It was great speaking with you today!  Please let me know if you have any questions about our visit.   Goals Addressed             This Visit's Progress    Monitor and Manage My Blood Sugar-Diabetes Type 2   On track    Timeframe:  Long-Range Goal Priority:  High Start Date:  02/05/2021                           Expected End Date:  08/08/2022                     Follow Up within 60 days   -Check blood sugar daily before breakfast - check blood sugar if I feel it is too high or too low - enter blood sugar readings and medication or insulin into daily log - take the blood sugar log to all doctor visits    Why is this important?   Checking your blood sugar at home helps to keep it from getting very high or very low.  Writing the results in a diary or log helps the doctor know how to care for you.  Your blood sugar log should have the time, date and the results.  Also, write down the amount of insulin or other medicine that you take.  Other information, like what you ate, exercise done and how you were feeling, will also be helpful.     Notes:         Patient Care Plan: General Pharmacy (Adult)     Problem Identified: Hypertension, Hyperlipidemia, Diabetes, COPD, Depression, Anxiety and Chronic Pain, Insomnia   Priority: High     Long-Range Goal: Patient-Specific Goal   Start Date: 02/05/2021  Expected End Date: 07/14/2022  This Visit's Progress: On track  Recent Progress: On track  Priority: High  Note:   Current Barriers:  Unable to independently afford treatment regimen Unable to achieve control of Diabetes   Pharmacist Clinical Goal(s):  Over the next 90 days, patient will achieve control of diabetes as evidenced by A1c less than 8% through collaboration with PharmD and provider.   Interventions: 1:1 collaboration with Steele Sizer, MD regarding development and update of comprehensive plan of care as evidenced by provider attestation and  co-signature Inter-disciplinary care team collaboration (see longitudinal plan of care) Comprehensive medication review performed; medication list updated in electronic medical record  Diabetes (A1c goal <8%) -Controlled -Current medications: Soliqua 30 units at breakfast Metformin XR 750 mg 2 tablets daily  -Medications previously tried: Actos (hypoglycemia), glipizide, Invokana, Jardiance,   -Current home glucose readings  Fasting Lunchtime Evening  12-Oct 117    11-Oct 98    10-Oct  159   9-Oct  87   8-Oct 91    7-Oct 87    6-Oct   154  5-Oct  113   4-Oct 91    3-Oct 103    2-Oct   165  1-Oct   81  30-Sep 98    29-Sep 128 192 (cake)   28-Sep 110    27-Sep 90    26-Sep 86    Average 100    -Reports hypoglycemic symptoms: Woozy, weakness. Occurs when she hasn't eaten anything.  -Current dietary patterns: Patient feels she has been struggling with a low-carbohydrate diet for her diabetes. She reports some additional weight gain over the past few months.  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 -Willeen Niece has been challenging to afford while patient is in donut hole. Patient was amenable to trying to see if she qualifies for patient assistance for Trulicity and Jardiance.  -Continue medications for now. Anticipate stopping [place] after Trulicity and Jardiance approved.    Hypertension (BP goal <140/90) -Controlled -Current treatment: Carvedilol 6.25 mg twice daily  Furosemide 20 mg daily  Lisinopril 40 mg daily  -Medications previously tried: NA  -Current home readings: NA -Denies hypotensive/hypertensive symptoms -Carvedilol increased by cardiology to address edema, patient feels it is unchanged. Counseled to ensure she is elevating legs, minimizing salt intake, and using compression stockings.  -Recommended to continue current medication  Hyperlipidemia: (LDL goal <  70) -Controlled -Current treatment: Atorvastatin 80 mg daily  -Current antiplatelet treatment: Aspirin 81 mg daily  -Medications previously tried: NA  -Educated on Importance of limiting foods high in cholesterol; -Recommended to continue current medication  COPD (Goal: control symptoms and prevent exacerbations) -Controlled -Current treatment  Ventolin HFA 2 puffs every 6 hours as needed  Bevespi 2 puffs at bedtime  -Medications previously tried: NA  -Gold Grade: Gold 1 (FEV1>80%) -Current COPD Classification:  A (low sx, <2 exacerbations/yr) -MMRC/CAT score: NA -Pulmonary function testing: FEV1 92% (Jun 2021) -Exacerbations requiring treatment in last 6 months: None -Patient reports consistent use of maintenance inhaler -Frequency of rescue inhaler use: Infrequently -Counseled on Benefits of consistent maintenance inhaler use -Recommended to continue current medication  Insomnia (Goal: Improve sleep quality) -Controlled -Current treatment: Trazodone 50 mg 1/2 - 1 tablet nightly as needed - Sleep -Medications previously tried/failed: NA -PHQ9: 4 -GAD7: NA -Educated on Benefits of medication for symptom control -Recommended to continue current medication  Patient Goals/Self-Care Activities Over the next 90 days, patient will:  - check glucose daily before breakfast, document, and provide at future appointments  Follow Up Plan: Telephone follow up appointment with care management team member scheduled for:  11/25/2021 at 9:15 AM      Patient agreed to services and verbal consent obtained.   Patient verbalizes understanding of instructions provided today and agrees to view in [place].   Malva Limes, [facility] [phone]

## 2021-09-14 ENCOUNTER — Ambulatory Visit
Admission: RE | Admit: 2021-09-14 | Discharge: 2021-09-14 | Disposition: A | Discharge status: Home / Self Care | Payer: Medicare HMO | Source: Ambulatory Visit | Attending: Family Medicine | Admitting: Family Medicine

## 2021-09-14 ENCOUNTER — Other Ambulatory Visit: Payer: Self-pay

## 2021-09-14 DIAGNOSIS — N6322 Unspecified lump in the left breast, upper inner quadrant: Secondary | ICD-10-CM | POA: Diagnosis not present

## 2021-09-14 DIAGNOSIS — N63 Unspecified lump in unspecified breast: Secondary | ICD-10-CM

## 2021-09-14 DIAGNOSIS — N6489 Other specified disorders of breast: Secondary | ICD-10-CM | POA: Diagnosis not present

## 2021-09-14 DIAGNOSIS — R922 Inconclusive mammogram: Secondary | ICD-10-CM | POA: Diagnosis not present

## 2021-09-16 ENCOUNTER — Telehealth: Payer: Self-pay

## 2021-09-16 NOTE — Telephone Encounter (Signed)
Lvm informing pt that her soliqua samples have arrived. To come today before 4:45 or that the doors will open in the morning at 7am.

## 2021-09-19 ENCOUNTER — Other Ambulatory Visit: Payer: Self-pay | Admitting: Family Medicine

## 2021-09-19 DIAGNOSIS — K219 Gastro-esophageal reflux disease without esophagitis: Secondary | ICD-10-CM

## 2021-09-19 NOTE — Telephone Encounter (Signed)
Requested Prescriptions  Pending Prescriptions Disp Refills  . famotidine (PEPCID) 20 MG tablet [Pharmacy Med Name: FAMOTIDINE 20 MG Tablet] 180 tablet 1    Sig: TAKE 1 TABLET TWICE DAILY     Gastroenterology:  H2 Antagonists Passed - 09/19/2021  7:01 AM      Passed - Valid encounter within last 12 months    Recent Outpatient Visits          4 weeks ago Type 2 diabetes mellitus with other specified complication, with long-term current use of insulin [facility])   [facility] [place], Drue Stager, MD   5 months ago Diabetes mellitus type 2 in obese [facility])   [facility] [place], Drue Stager, MD   9 months ago DM (diabetes mellitus), type 2, uncontrolled, with renal complications [facility])   [facility] [place], Drue Stager, MD   1 year ago DM (diabetes mellitus), type 2, uncontrolled, with renal complications [facility])   [facility] [place], Drue Stager, MD   1 year ago DM (diabetes mellitus), type 2, uncontrolled, with renal complications [facility])   [facility] Steele Sizer, MD      Future Appointments            In 3 months Ancil Boozer, Drue Stager, MD [facility], [place]   In 10 months  [facility]

## 2021-09-28 DIAGNOSIS — I10 Essential (primary) hypertension: Secondary | ICD-10-CM

## 2021-09-28 DIAGNOSIS — Z794 Long term (current) use of insulin: Secondary | ICD-10-CM | POA: Diagnosis not present

## 2021-09-28 DIAGNOSIS — E1169 Type 2 diabetes mellitus with other specified complication: Secondary | ICD-10-CM | POA: Diagnosis not present

## 2021-09-30 ENCOUNTER — Telehealth: Payer: Self-pay

## 2021-09-30 ENCOUNTER — Ambulatory Visit (INDEPENDENT_AMBULATORY_CARE_PROVIDER_SITE_OTHER): Payer: Medicare HMO

## 2021-09-30 ENCOUNTER — Other Ambulatory Visit: Payer: Self-pay | Admitting: Family Medicine

## 2021-09-30 DIAGNOSIS — E1169 Type 2 diabetes mellitus with other specified complication: Secondary | ICD-10-CM

## 2021-09-30 DIAGNOSIS — E1121 Type 2 diabetes mellitus with diabetic nephropathy: Secondary | ICD-10-CM

## 2021-09-30 DIAGNOSIS — E669 Obesity, unspecified: Secondary | ICD-10-CM

## 2021-09-30 DIAGNOSIS — E1142 Type 2 diabetes mellitus with diabetic polyneuropathy: Secondary | ICD-10-CM

## 2021-09-30 DIAGNOSIS — E785 Hyperlipidemia, unspecified: Secondary | ICD-10-CM

## 2021-09-30 DIAGNOSIS — I10 Essential (primary) hypertension: Secondary | ICD-10-CM

## 2021-09-30 MED ORDER — DULAGLUTIDE 1.5 MG/0.5ML ~~LOC~~ SOAJ: 1.5000 mg | SUBCUTANEOUS | Status: DC

## 2021-09-30 NOTE — Telephone Encounter (Signed)
Requested Prescriptions  Pending Prescriptions Disp Refills  . pioglitazone (ACTOS) 15 MG tablet [Pharmacy Med Name: PIOGLITAZONE HYDROCHLORIDE 15 MG Tablet] 90 tablet     Sig: TAKE 1 TABLET EVERY DAY     Endocrinology:  Diabetes - Glitazones - pioglitazone Passed - 09/30/2021  3:48 AM      Passed - HBA1C is between 0 and 7.9 and within 180 days    Hemoglobin A1C  Date Value Ref Range Status  08/21/2021 7.5 (A) 4.0 - 5.6 % Final   HbA1c, POC (controlled diabetic range)  Date Value Ref Range Status  07/03/2019 9.3 (A) 0.0 - 7.0 % Final         Passed - Valid encounter within last 6 months    Recent Outpatient Visits          1 month ago Type 2 diabetes mellitus with other specified complication, with long-term current use of insulin [facility])   [facility] [place], Drue Stager, MD   5 months ago Diabetes mellitus type 2 in obese [facility])   [facility] [place], Drue Stager, MD   9 months ago DM (diabetes mellitus), type 2, uncontrolled, with renal complications [facility])   [facility] [place], Drue Stager, MD   1 year ago DM (diabetes mellitus), type 2, uncontrolled, with renal complications [facility])   [facility] [place], Drue Stager, MD   1 year ago DM (diabetes mellitus), type 2, uncontrolled, with renal complications [facility])   [facility] Steele Sizer, MD      Future Appointments            In 2 months Ancil Boozer, Drue Stager, MD [facility], [place]   In 9 months  [facility] [place]           . lisinopril (ZESTRIL) 40 MG tablet [Pharmacy Med Name: LISINOPRIL 40 MG Tablet] 90 tablet 1    Sig: TAKE 1 TABLET (40 MG TOTAL) BY MOUTH AT BEDTIME.     Cardiovascular:  ACE Inhibitors Failed - 09/30/2021  3:48 AM      Failed - Cr in normal range and within 180 days    Creat  Date Value Ref Range Status  12/10/2020 0.86 0.60 - 0.93 mg/dL Final    Comment:    For patients >49 years of  age, the reference limit for Creatinine is approximately 13% higher for people identified as African-American. .    Creatinine, Urine  Date Value Ref Range Status  12/10/2020 12 (L) 20 - 275 mg/dL Final         Failed - K in normal range and within 180 days    Potassium  Date Value Ref Range Status  12/10/2020 4.4 3.5 - 5.3 mmol/L Final         Passed - Patient is not pregnant      Passed - Last BP in normal range    BP Readings from Last 1 Encounters:  08/21/21 128/74         Passed - Valid encounter within last 6 months    Recent Outpatient Visits          1 month ago Type 2 diabetes mellitus with other specified complication, with long-term current use of insulin [facility])   [facility] [place], Drue Stager, MD   5 months ago Diabetes mellitus type 2 in obese [facility])   [facility] [place], Drue Stager, MD   9 months ago DM (diabetes mellitus), type 2,  uncontrolled, with renal complications [facility])   [facility] [place], Drue Stager, MD   1 year ago DM (diabetes mellitus), type 2, uncontrolled, with renal complications [facility])   [facility] [place], Drue Stager, MD   1 year ago DM (diabetes mellitus), type 2, uncontrolled, with renal complications [facility])   [facility] Steele Sizer, MD      Future Appointments            In 2 months Steele Sizer, MD [facility] [place]   In 9 months  [facility]           . metFORMIN (GLUCOPHAGE-XR) 750 MG 24 hr tablet [Pharmacy Med Name: METFORMIN HYDROCHLORIDE ER 750 MG Tablet Extended Release 24 Hour] 180 tablet 1    Sig: TAKE 2 TABLETS (1,500 MG TOTAL) BY MOUTH DAILY WITH BREAKFAST.     Endocrinology:  Diabetes - Biguanides Passed - 09/30/2021  3:48 AM      Passed - Cr in normal range and within 360 days    Creat  Date Value Ref Range Status  12/10/2020 0.86 0.60 - 0.93 mg/dL Final    Comment:    For  patients >56 years of age, the reference limit for Creatinine is approximately 13% higher for people identified as African-American. .    Creatinine, Urine  Date Value Ref Range Status  12/10/2020 12 (L) 20 - 275 mg/dL Final         Passed - HBA1C is between 0 and 7.9 and within 180 days    Hemoglobin A1C  Date Value Ref Range Status  08/21/2021 7.5 (A) 4.0 - 5.6 % Final   HbA1c, POC (controlled diabetic range)  Date Value Ref Range Status  07/03/2019 9.3 (A) 0.0 - 7.0 % Final         Passed - AA eGFR in normal range and within 360 days    GFR, Est African American  Date Value Ref Range Status  12/10/2020 77 > OR = 60 mL/min/1.57m Final   GFR, Est Non African American  Date Value Ref Range Status  12/10/2020 67 > OR = 60 mL/min/1.761mFinal         Passed - Valid encounter within last 6 months    Recent Outpatient Visits          1 month ago Type 2 diabetes mellitus with other specified complication, with long-term current use of insulin [facility]  [facility]   5 months ago Diabetes mellitus type 2 in obese ([facility]  [facility]   9 months ago DM (diabetes mellitus), type 2, uncontrolled, with renal complications [facility]  [facility]   1 year ago DM (diabetes mellitus), type 2, uncontrolled, with renal complications ([facility]  [facility]   1 year ago DM (diabetes mellitus), type 2, uncontrolled, with renal complications ([facility]  [facility] SizerMD      Future Appointments            In 2 months SoAncil BoozerKrDrue StagerMD [facility] In 9 months  [facility]         . atorvastatin (LIPITOR) 80 MG tablet [Pharmacy Med Name: ATORVASTATIN CALCIUM 80 MG Tablet] 90 tablet 1    Sig: TAKE 1 TABLET EVERY DAY  Cardiovascular:  Antilipid  - Statins Passed - 09/30/2021  3:48 AM      Passed - Total Cholesterol in normal range and within 360 days    Cholesterol, Total  Date Value Ref Range Status  11/21/2015 146 100 - 199 mg/dL Final   Cholesterol  Date Value Ref Range Status  12/10/2020 127 <200 mg/dL Final         Passed - LDL in normal range and within 360 days    LDL Cholesterol (Calc)  Date Value Ref Range Status  12/10/2020 52 mg/dL (calc) Final    Comment:    Reference range: <100 . Desirable range <100 mg/dL for primary prevention;   <70 mg/dL for patients with CHD or diabetic patients  with > or = 2 CHD risk factors. [name] LDL-C is now calculated using the Martin-Hopkins  calculation, which is a validated novel method providing  better accuracy than the Friedewald equation in the  estimation of LDL-C.  Cresenciano Genre et al. Annamaria Helling. 6553;748(27): 2061-2068  (http://education.Quest[employer].com/faq/FAQ164)          Passed - HDL in normal range and within 360 days    HDL  Date Value Ref Range Status  12/10/2020 56 > OR = 50 mg/dL Final  11/21/2015 62 >39 mg/dL Final         Passed - Triglycerides in normal range and within 360 days    Triglycerides  Date Value Ref Range Status  12/10/2020 104 <150 mg/dL Final         Passed - Patient is not pregnant      Passed - Valid encounter within last 12 months    Recent Outpatient Visits          1 month ago Type 2 diabetes mellitus with other specified complication, with long-term current use of insulin [facility])   [facility] [place], Drue Stager, MD   5 months ago Diabetes mellitus type 2 in obese [facility])   [facility] [place], Drue Stager, MD   9 months ago DM (diabetes mellitus), type 2, uncontrolled, with renal complications [facility])   [facility] [place], Drue Stager, MD   1 year ago DM (diabetes mellitus), type 2, uncontrolled, with renal complications [facility])   [facility] [place], Drue Stager, MD   1  year ago DM (diabetes mellitus), type 2, uncontrolled, with renal complications [facility])   [facility] Steele Sizer, MD      Future Appointments            In 2 months Ancil Boozer, Drue Stager, MD [facility] [place]   In 9 months  [facility]           . furosemide (LASIX) 20 MG tablet [Pharmacy Med Name: FUROSEMIDE 20 MG Tablet] 90 tablet 1    Sig: TAKE 1 TABLET EVERY DAY     Cardiovascular:  Diuretics - Loop Passed - 09/30/2021  3:48 AM      Passed - K in normal range and within 360 days    Potassium  Date Value Ref Range Status  12/10/2020 4.4 3.5 - 5.3 mmol/L Final         Passed - Ca in normal range and within 360 days    Calcium  Date Value Ref Range Status  12/10/2020 10.3 8.6 - 10.4 mg/dL Final         Passed - Na in normal range and within 360 days    Sodium  Date  Value Ref Range Status  12/10/2020 141 135 - 146 mmol/L Final  11/21/2015 141 134 - 144 mmol/L Final         Passed - Cr in normal range and within 360 days    Creat  Date Value Ref Range Status  12/10/2020 0.86 0.60 - 0.93 mg/dL Final    Comment:    For patients >53 years of age, the reference limit for Creatinine is approximately 13% higher for people identified as African-American. .    Creatinine, Urine  Date Value Ref Range Status  12/10/2020 12 (L) 20 - 275 mg/dL Final         Passed - Last BP in normal range    BP Readings from Last 1 Encounters:  08/21/21 128/74         Passed - Valid encounter within last 6 months    Recent Outpatient Visits          1 month ago Type 2 diabetes mellitus with other specified complication, with long-term current use of insulin [facility])   [facility] [place], Drue Stager, MD   5 months ago Diabetes mellitus type 2 in obese [facility])   [facility] [place], Drue Stager, MD   9 months ago DM (diabetes mellitus), type 2, uncontrolled, with renal complications [facility])   [facility] [place], Drue Stager, MD   1 year ago DM (diabetes mellitus), type 2, uncontrolled, with renal complications [facility])   [facility] [place], Drue Stager, MD   1 year ago DM (diabetes mellitus), type 2, uncontrolled, with renal complications [facility])   [facility] Steele Sizer, MD      Future Appointments            In 2 months Ancil Boozer, Drue Stager, MD [facility] [place]   In 9 months  [facility]

## 2021-09-30 NOTE — Progress Notes (Signed)
[place] form for Bevespi completed by patient and faxed for review on 09/30/21.   Trulicity approved through Dec 2023 .  Patient instructed to wait to start Trulicity until after speaking with clinical pharmacist.    Vania Rea application requires LIS denial letter.

## 2021-09-30 NOTE — Progress Notes (Signed)
Chronic Care Management Pharmacy Assistant   Name: Cindy Griffin  MRN: [id number] DOB: 01-17-1946  Reason for Encounter:Medication Review General Adherence  Call.   Recent office visits:  No recent Office visit  Recent consult visits:  No recent consult Visit  Hospital visits:  None in previous 6 months  Medications: Outpatient Encounter Medications as of 09/30/2021  Medication Sig Note   acetaminophen (TYLENOL) 500 MG tablet Take 1,000 mg by mouth every 6 (six) hours as needed for moderate pain or headache.     albuterol (VENTOLIN HFA) 108 (90 Base) MCG/ACT inhaler Inhale 2 puffs into the lungs every 6 (six) hours as needed for wheezing or shortness of breath.    Ascorbic Acid (VITAMIN C) 1000 MG tablet Take 1,000 mg by mouth daily.    aspirin EC 81 MG tablet Take 1 tablet (81 mg total) by mouth daily.    atorvastatin (LIPITOR) 80 MG tablet TAKE 1 TABLET EVERY DAY    BD PEN NEEDLE MICRO U/F 32G X 6 MM MISC USE ONCE DAILY    BEVESPI AEROSPHERE 9-4.8 MCG/ACT AERO INHALE 2 PUFFS BY MOUTH AT BEDTIME    Biotin 5000 MCG CAPS Take by mouth.    Carboxymethylcellul-Glycerin (LUBRICATING EYE DROPS OP) Place 2 drops into both eyes 2 (two) times daily as needed (for dry eyes).    carvedilol (COREG) 3.125 MG tablet Take 3.125 mg by mouth 2 (two) times daily with a meal. 03/11/2021: Prescribed by Dr. Nehemiah Massed   cetirizine (ZYRTEC) 10 MG tablet Take 10 mg by mouth every other day as needed for allergies.     Cholecalciferol (VITAMIN D-3) 1000 units CAPS Take 1,000 Units by mouth daily.     famotidine (PEPCID) 20 MG tablet TAKE 1 TABLET TWICE DAILY    fluticasone (FLONASE) 50 MCG/ACT nasal spray USE 2 SPRAYS IN EACH NOSTRIL EVERY DAY    furosemide (LASIX) 20 MG tablet TAKE 1 TABLET EVERY DAY    Lancets (ONETOUCH DELICA PLUS [id number]) MISC CHECK GLUCOSE THREE TIMES DAILY    lisinopril (ZESTRIL) 40 MG tablet TAKE 1 TABLET (40 MG TOTAL) BY MOUTH AT BEDTIME.    meclizine (ANTIVERT) 25 MG tablet  Take 1 tablet (25 mg total) by mouth every 6 (six) hours.    metFORMIN (GLUCOPHAGE-XR) 750 MG 24 hr tablet TAKE 2 TABLETS (1,500 MG TOTAL) BY MOUTH DAILY WITH BREAKFAST.    Multiple Vitamins-Minerals (MULTIVITAMIN PO) Take 1 tablet by mouth daily.    Omega-3 1000 MG CAPS Take by mouth.    pregabalin (LYRICA) 75 MG capsule Take 1 capsule (75 mg total) by mouth 2 (two) times daily.    SOLIQUA 100-33 UNT-MCG/ML SOPN INJECT 10 TO 50 UNITS SUBCUTANEOUSLY ONCE DAILY    tiZANidine (ZANAFLEX) 2 MG tablet 1 po tid prn    traMADol (ULTRAM) 50 MG tablet Take 1 tablet by mouth 2 (two) times daily as needed.     traZODone (DESYREL) 50 MG tablet TAKE 1/2 TO 1 TABLET AT BEDTIME AS NEEDED FOR SLEEP    Turmeric 500 MG CAPS Take by mouth.    vitamin B-12 (CYANOCOBALAMIN) 500 MCG tablet Take 1,000 mcg by mouth daily.    No facility-administered encounter medications on file as of 09/30/2021.   Care Gaps: Tetanus Vaccine COVID-19 Vaccine Star Rating Drugs: Atorvastatin 80 mg last filled on 07/24/2021 for 90 day supply, No pharmacy noted Lisinopril 40 mg last filled on 07/24/2021 for 90 day supply, No pharmacy noted Metformin 750 mg last filled on 07/24/2021  for 90 day supply, No pharmacy noted Medication Fill Gaps: Pregabalin 75 MG capsule last filled 07/29/2020 90 day supply  Recent Relevant Labs: Lab Results  Component Value Date/Time   HGBA1C 7.5 (A) 08/21/2021 10:14 AM   HGBA1C 6.6 (A) 04/15/2021 09:40 AM   HGBA1C 9.3 (A) 07/03/2019 09:19 AM   HGBA1C 6.9 03/01/2019 10:05 AM   MICROALBUR <0.2 12/10/2020 12:00 AM   MICROALBUR 0.6 11/12/2019 12:00 AM   MICROALBUR 50 07/22/2017 09:00 AM   MICROALBUR 50 07/15/2016 10:52 AM    Kidney Function Lab Results  Component Value Date/Time   CREATININE 0.86 12/10/2020 12:00 AM   CREATININE 0.85 09/09/2020 01:08 PM   CREATININE 0.98 06/23/2020 01:26 PM   CREATININE 0.76 11/12/2019 12:00 AM   GFRNONAA 67 12/10/2020 12:00 AM   GFRAA 77 12/10/2020 12:00 AM     Patient states she is stress and has a lot of thoughts going through her mind at this moment.Patient reports her son was in a car wreck on 09/28/2021 around 6:00 pm because he fell asleep behind the wheel.Patient states to return her call at another time.   Patient Denies ED visit since her last CPP follow up.  Patient Denies  any side effects with her medication. Patient Denies  any problems with hercurrent pharmacy   Patient assistance update for Trulicity and Jardiance:  I reach out to [employer] to get the status of patient application for Trulicity.Per [employer], patient was approved on 09/16/2021 through 11/28/2022. Trulicity is still in the shipment process, and it can take 10-14 days before someone reach out to the patient to confirm shipment.Patient is on day 11.If patient needs it sooner she can call and talk to the pharmacy and request Trulicity at [phone].  I reach out to [facility] to get the status of the patient application for Jardiance.Per [employer], they need a denial letter from the LIS program before she can be approve, and once they receive the denial letter she will be approve.Notifed Clinical Pharmacist  Patient states she did receive a letter stating she was approve for LIS program, but it is not effective until January 2023.Patient states she will bring the letter to her PCP office ,so the clinical pharmacist can review it and inform her of his recommendations.Notified Clinical pharmacist.  Per Clinical Pharmacist, Let her know to inform us when she receives trulicity as she should not take it with her [place]. Patient Verbalized understanding and agrees to return my call or to reach out to her PCP office.  [place] Pharmacist Assistant [phone]

## 2021-09-30 NOTE — Telephone Encounter (Signed)
Requested medications are due for refill today.  no  Requested medications are on the active medications list.  no  Last refill. 03/07/2021  Future visit scheduled.   yes  Notes to clinic.  Medication was d/c'd 08/21/2021.

## 2021-10-05 ENCOUNTER — Other Ambulatory Visit: Payer: Self-pay | Admitting: Family Medicine

## 2021-10-08 ENCOUNTER — Telehealth: Payer: Self-pay

## 2021-10-08 NOTE — Progress Notes (Signed)
Patient states she received noticed from [facility] that she will get her shipment on 94/50/3888 of Trulicity.Notified Clinical pharmacist.   Per Clinical Pharmacist, Please instruct patient to:  STOP Soliquia  START Trulicity 1.5 mg inject one pen into the skin weekly  CONTINUE metformin  Please inform her that her blood sugars may run a little higher initially as we have not gotten the Jardiance approved yet, and to inform us if they are consistently greater than 200. She dropped a letter off on my desk about Extra Help, it looks like she was approved for 2023, when you speak with her can you confirm if she know she was approved? If so, I will have to send the Jardiance through her pharmacy instead.    Patient states she is aware she is approved for Extra Help Program.Patient is asking if she has to wait until January before she can use the assistance to help cover Jardiance.Notified Clinical Pharmacist.  Per Clinical pharmacist, I would be happy to send in an Rx into her local pharmacy so she can see if Vania Rea would be affordable until January, otherwise yes we will probably need to wait until January to start the medication.  Patient states she would prefer to wait until January and verbalized understanding to all.  [place] Pharmacist Assistant [phone]

## 2021-10-12 ENCOUNTER — Telehealth: Payer: Self-pay

## 2021-10-12 DIAGNOSIS — E119 Type 2 diabetes mellitus without complications: Secondary | ICD-10-CM | POA: Diagnosis not present

## 2021-10-12 DIAGNOSIS — Z0100 Encounter for examination of eyes and vision without abnormal findings: Secondary | ICD-10-CM | POA: Diagnosis not present

## 2021-10-12 NOTE — Progress Notes (Signed)
Patient states she has not received her shipment from [employer] for [employer]. Informed patient I would call and check the shipment status.  Per [employer], patient needs to contact the pharmacy to check the shipment status at [phone].  I reach out to the pharmacy to get the shipment status for Trulicity.Per [employer], there was a shipping delay,but patient order has been process and she should receive her shipment on Wednesday 10/14/2021.  [place] Pharmacist Assistant [phone]

## 2021-10-15 ENCOUNTER — Telehealth: Payer: Self-pay

## 2021-10-15 NOTE — Progress Notes (Signed)
Patient states she is confused on how to use Trulicity, and would like to set up a office appointment with the clinical pharmacist to [place] states she still using her insulin at the moment.  Office follow up appointment with Care management team member scheduled for : 10/21/2021 at 1:00 pm.Patient is aware to bring her Trulicity with her to her appointment.    [place] Pharmacist Assistant [phone]

## 2021-10-21 ENCOUNTER — Ambulatory Visit: Payer: Medicare HMO

## 2021-10-21 ENCOUNTER — Other Ambulatory Visit: Payer: Self-pay

## 2021-10-21 DIAGNOSIS — J411 Mucopurulent chronic bronchitis: Secondary | ICD-10-CM

## 2021-10-21 DIAGNOSIS — Z794 Long term (current) use of insulin: Secondary | ICD-10-CM

## 2021-10-21 MED ORDER — TRULICITY 3 MG/0.5ML ~~LOC~~ SOAJ: 3.0000 mg | SUBCUTANEOUS | Status: DC

## 2021-10-26 MED ORDER — BEVESPI AEROSPHERE 9-4.8 MCG/ACT IN AERO: INHALATION_SPRAY | RESPIRATORY_TRACT | 11 refills | Status: DC

## 2021-10-26 NOTE — Patient Instructions (Signed)
Visit Information It was great speaking with you today!  Please let me know if you have any questions about our visit.   Goals Addressed             This Visit's Progress    Monitor and Manage My Blood Sugar-Diabetes Type 2   On track    Timeframe:  Long-Range Goal Priority:  High Start Date:  02/05/2021                           Expected End Date:  08/08/2022                     Follow Up within 60 days   -Check blood sugar daily before breakfast - check blood sugar if I feel it is too high or too low - enter blood sugar readings and medication or insulin into daily log - take the blood sugar log to all doctor visits    Why is this important?   Checking your blood sugar at home helps to keep it from getting very high or very low.  Writing the results in a diary or log helps the doctor know how to care for you.  Your blood sugar log should have the time, date and the results.  Also, write down the amount of insulin or other medicine that you take.  Other information, like what you ate, exercise done and how you were feeling, will also be helpful.     Notes:         Patient Care Plan: General Pharmacy (Adult)     Problem Identified: Hypertension, Hyperlipidemia, Diabetes, COPD, Depression, Anxiety and Chronic Pain, Insomnia   Priority: High     Long-Range Goal: Patient-Specific Goal   Start Date: 02/05/2021  Expected End Date: 07/14/2022  This Visit's Progress: On track  Recent Progress: On track  Priority: High  Note:   Current Barriers:  Unable to independently afford treatment regimen Unable to achieve control of Diabetes   Pharmacist Clinical Goal(s):  Over the next 90 days, patient will achieve control of diabetes as evidenced by A1c less than 8% through collaboration with PharmD and provider.   Interventions: 1:1 collaboration with Steele Sizer, MD regarding development and update of comprehensive plan of care as evidenced by provider attestation and  co-signature Inter-disciplinary care team collaboration (see longitudinal plan of care) Comprehensive medication review performed; medication list updated in electronic medical record  Diabetes (A1c goal <8%) -Controlled -Current medications: Soliqua 30 units at breakfast Metformin XR 750 mg 2 tablets daily  -Medications previously tried: Actos (hypoglycemia), glipizide, Invokana, Jardiance,   -Current home glucose readings -Reports hypoglycemic symptoms: Woozy, weakness. Occurs when she hasn't eaten anything.  -Current dietary patterns: Patient feels she has been struggling with a low-carbohydrate diet for her diabetes. She reports some additional weight gain over the past few months.  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 -Patient instructed on proper use of Trulicity. Vania Rea will not be able to be started until 2023 when LIS is approved.   Hypertension (BP goal <140/90) -Controlled -Current treatment: Carvedilol 6.25 mg twice daily  Furosemide 20 mg daily  Lisinopril 40 mg daily  -Medications previously tried: NA  -Current home readings: NA -Denies hypotensive/hypertensive symptoms -Carvedilol increased by cardiology to address edema, patient feels it is unchanged. Counseled to ensure she is elevating legs,  minimizing salt intake, and using compression stockings.  -Recommended to continue current medication  Hyperlipidemia: (LDL goal < 70) -Controlled -Current treatment: Atorvastatin 80 mg daily  -Current antiplatelet treatment: Aspirin 81 mg daily  -Medications previously tried: NA  -Educated on Importance of limiting foods high in cholesterol; -Recommended to continue current medication  COPD (Goal: control symptoms and prevent exacerbations) -Controlled -Current treatment  Ventolin HFA 2 puffs every 6 hours as needed  Bevespi 2 puffs at bedtime  -Medications  previously tried: NA  -Gold Grade: Gold 1 (FEV1>80%) -Current COPD Classification:  A (low sx, <2 exacerbations/yr) -MMRC/CAT score: NA -Pulmonary function testing: FEV1 92% (Jun 2021) -Exacerbations requiring treatment in last 6 months: None -Patient reports consistent use of maintenance inhaler -Frequency of rescue inhaler use: Infrequently -Counseled on Benefits of consistent maintenance inhaler use -Recommended to continue current medication  Insomnia (Goal: Improve sleep quality) -Controlled -Current treatment: Trazodone 50 mg 1/2 - 1 tablet nightly as needed - Sleep -Medications previously tried/failed: NA -PHQ9: 4 -GAD7: NA -Educated on Benefits of medication for symptom control -Recommended to continue current medication  Patient Goals/Self-Care Activities Over the next 90 days, patient will:  - check glucose daily before breakfast, document, and provide at future appointments  Follow Up Plan: Telephone follow up appointment with care management team member scheduled for:  11/25/2021 at 9:15 AM      Patient agreed to services and verbal consent obtained.   Patient verbalizes understanding of instructions provided today and agrees to view in [place].   Malva Limes, [place] Pharmacist Practitioner  [facility] [phone]

## 2021-10-26 NOTE — Progress Notes (Signed)
Chronic Care Management Pharmacy Note  10/26/2021 Name:  Cindy Griffin MRN:  [id number] DOB:  07-04-1946  Summary: Patient presents for CCM follow-up. She has received her supply of Trulicity and was given instruction on how to administer the medication.   Recommendations/Changes made from today's visit: -STOP Soliqua  -START Trulicity 3 mg weekly   -Refill of Bevespi sent in for [place] patient assistance  Plan: CPP follow-up 2 months  Recommended Problem List Changes:   Modify: Type 2 diabetes mellitus with diabetic polyneuropathy, with long-term current use of insulin (HCC)  Subjective: Cindy Griffin is an 75 y.o. year old female who is a primary patient of Steele Sizer, MD.  The CCM team was consulted for assistance with disease management and care coordination needs.    Engaged with patient by telephone for follow up visit in response to provider referral for pharmacy case management and/or care coordination services.   Consent to Services:  The patient was given information about Chronic Care Management services, agreed to services, and gave verbal consent prior to initiation of services.  Please see initial visit note for detailed documentation.   Patient Care Team: Steele Sizer, MD as PCP - General (Family Medicine) Sharlet Salina, MD as Consulting Physician (Physical Medicine and Rehabilitation) Merlene Morse, MD as Consulting Physician (Orthopedic Surgery) Ileana Roup, MD as Consulting Physician (General Surgery) Germaine Pomfret, [facility] as Pharmacist (Pharmacist) Corey Skains, MD as Consulting Physician (Cardiology) Erby Pian, MD as Referring Physician (Pulmonary Disease) Cammie Sickle, MD as Consulting Physician (Hematology and Oncology)  Recent office visits: 08/21/21: Patient presented to Dr. Ancil Boozer for follow-up. A1c 7.5%. BP 128/74.  07/16/21: Patient presented to Clemetine Marker, LPN for AWV.  Recent consult  visits: 08/04/21: Patient presented to Dr. Nehemiah Massed (Cardiology) for follow-up. Carvedilol increased to 6.25 mg twice daily.   Hospital visits: None in previous 6 months  Objective:  Lab Results  Component Value Date   CREATININE 0.86 12/10/2020   BUN 15 12/10/2020   GFRNONAA 67 12/10/2020   GFRAA 77 12/10/2020   NA 141 12/10/2020   K 4.4 12/10/2020   CALCIUM 10.3 12/10/2020   CO2 28 12/10/2020    Lab Results  Component Value Date/Time   HGBA1C 7.5 (A) 08/21/2021 10:14 AM   HGBA1C 6.6 (A) 04/15/2021 09:40 AM   HGBA1C 9.3 (A) 07/03/2019 09:19 AM   HGBA1C 6.9 03/01/2019 10:05 AM   FRUCTOSAMINE 264 12/27/2017 09:56 AM   MICROALBUR <0.2 12/10/2020 12:00 AM   MICROALBUR 0.6 11/12/2019 12:00 AM   MICROALBUR 50 07/22/2017 09:00 AM   MICROALBUR 50 07/15/2016 10:52 AM    Last diabetic Eye exam:  Lab Results  Component Value Date/Time   HMDIABEYEEXA No Retinopathy 10/08/2020 12:00 AM    Last diabetic Foot exam: No results found for: HMDIABFOOTEX   Lab Results  Component Value Date   CHOL 127 12/10/2020   HDL 56 12/10/2020   LDLCALC 52 12/10/2020   TRIG 104 12/10/2020   CHOLHDL 2.3 12/10/2020    Hepatic Function Latest Ref Rng & Units 12/10/2020 06/23/2020 12/21/2019  Total Protein 6.1 - 8.1 g/dL 7.4 7.4 7.3  Albumin 3.5 - 5.0 g/dL - 4.0 4.2  AST 10 - 35 U/L 15 22 22   ALT 6 - 29 U/L 15 18 20   Alk Phosphatase 38 - 126 U/L - 76 77  Total Bilirubin 0.2 - 1.2 mg/dL 0.3 0.5 0.6    No results found for: TSH, FREET4  CBC Latest Ref Rng &  Units 12/10/2020 09/09/2020 06/23/2020  WBC 3.8 - 10.8 Thousand/uL 6.7 6.7 7.3  Hemoglobin 11.7 - 15.5 g/dL 13.2 12.3 11.8(L)  Hematocrit 35.0 - 45.0 % 39.2 38.8 36.0  Platelets 140 - 400 Thousand/uL 344 292 282    Lab Results  Component Value Date/Time   VD25OH 30 12/10/2020 12:00 AM   VD25OH 51 10/31/2018 12:06 PM    Clinical ASCVD: No  The ASCVD Risk score (Arnett DK, et al., 2019) failed to calculate for the following reasons:    The valid total cholesterol range is 130 to 320 mg/dL    Depression screen [facility] 2/9 08/21/2021 07/16/2021 04/15/2021  Decreased Interest 0 0 0  Down, Depressed, Hopeless 0 1 0  PHQ - 2 Score 0 1 0  Altered sleeping 2 1 0  Tired, decreased energy 1 1 1   Change in appetite 1 0 1  Feeling bad or failure about yourself  0 0 0  Trouble concentrating 0 1 0  Moving slowly or fidgety/restless 0 0 0  Suicidal thoughts 0 0 0  PHQ-9 Score 4 4 2   Difficult doing work/chores Not difficult at all Not difficult at all -  Some recent data might be hidden    Social History   Tobacco Use  Smoking Status Never  Smokeless Tobacco Never  Tobacco Comments   smoking cessation materials not required   BP Readings from Last 3 Encounters:  08/21/21 128/74  07/16/21 128/76  06/26/21 132/70   Pulse Readings from Last 3 Encounters:  08/21/21 81  07/16/21 89  06/26/21 80   Wt Readings from Last 3 Encounters:  08/21/21 248 lb 11.2 oz (112.8 kg)  07/16/21 246 lb 12.8 oz (111.9 kg)  06/26/21 250 lb (113.4 kg)    Assessment/Interventions: Review of patient past medical history, allergies, medications, health status, including review of consultants reports, laboratory and other test data, was performed as part of comprehensive evaluation and provision of chronic care management services.   SDOH:  (Social Determinants of Health) assessments and interventions performed: Yes SDOH Interventions    Flowsheet Row Most Recent Value  SDOH Interventions   Financial Strain Interventions Other (Comment)  [PAP]          CCM Care Plan  Allergies  Allergen Reactions   Doxycycline Rash    Itching    Medications Reviewed Today     Reviewed by Steele Sizer, MD (Physician) on 08/21/21 at 40  Med List Status: <None>   Medication Order Taking? Sig Documenting Provider Last Dose Status Informant  acetaminophen (TYLENOL) 500 MG tablet [id number] Yes Take 1,000 mg by mouth every 6 (six) hours as needed  for moderate pain or headache.  [provider] Taking Active Self  albuterol (VENTOLIN HFA) 108 (90 Base) MCG/ACT inhaler [id number] Yes Inhale 2 puffs into the lungs every 6 (six) hours as needed for wheezing or shortness of breath. Steele Sizer, MD Taking Active   Ascorbic Acid (VITAMIN C) 1000 MG tablet [id number] Yes Take 1,000 mg by mouth daily. [provider] Taking Active Self  aspirin EC 81 MG tablet [id number] Yes Take 1 tablet (81 mg total) by mouth daily. Steele Sizer, MD Taking Active Self  atorvastatin (LIPITOR) 80 MG tablet [id number] Yes Take 1 tablet (80 mg total) by mouth daily. Steele Sizer, MD Taking Active   BD PEN NEEDLE MICRO U/F 32G X 6 MM MISC [id number] Yes USE ONCE DAILY Steele Sizer, MD Taking Active   BEVESPI AEROSPHERE 9-4.8 MCG/ACT AERO [id number] Yes INHALE 2  PUFFS BY MOUTH AT BEDTIME Steele Sizer, MD Taking Active   Biotin 5000 MCG CAPS [id number] Yes Take by mouth. [provider] Taking Active Self  Carboxymethylcellul-Glycerin (LUBRICATING EYE DROPS OP) [id number] Yes Place 2 drops into both eyes 2 (two) times daily as needed (for dry eyes). [provider] Taking Active Self  carvedilol (COREG) 3.125 MG tablet [id number] Yes Take 3.125 mg by mouth 2 (two) times daily with a meal. [provider] Taking Active            Med Note Michaelle Birks, Jessyka Austria A   Wed Mar 11, 2021  1:26 PM) Prescribed by Dr. Nehemiah Massed  cetirizine (ZYRTEC) 10 MG tablet [id number] Yes Take 10 mg by mouth every other day as needed for allergies.  [provider] Taking Active Self           Med Note Larwance Sachs A   Thu May 06, 2016 12:00 PM)    Cholecalciferol (VITAMIN D-3) 1000 units CAPS [id number] Yes Take 1,000 Units by mouth daily.  [provider] Taking Active Self  famotidine (PEPCID) 20 MG tablet [id number] Yes Take 1 tablet (20 mg total) by mouth 2 (two) times daily. Steele Sizer, MD Taking Active   fluticasone  [facility]) 50 MCG/ACT nasal spray [id number] Yes USE 2 SPRAYS IN EACH NOSTRIL EVERY Eloise Harman, MD Taking Active   furosemide (LASIX) 20 MG tablet [id number] Yes TAKE 1 TABLET EVERY DAY Steele Sizer, MD Taking Active   Lancets (ONETOUCH DELICA PLUS VELFYB01B) [place] [id number] Yes CHECK GLUCOSE THREE TIMES DAILY [provider] Taking Active   lisinopril (ZESTRIL) 40 MG tablet [id number] Yes Take 1 tablet (40 mg total) by mouth at bedtime. Steele Sizer, MD Taking Active   meclizine (ANTIVERT) 25 MG tablet [id number] Yes Take 1 tablet (25 mg total) by mouth every 6 (six) hours. Steele Sizer, MD Taking Active   metFORMIN (GLUCOPHAGE-XR) 750 MG 24 hr tablet [id number] Yes Take 2 tablets (1,500 mg total) by mouth daily with breakfast. Steele Sizer, MD Taking Active   Multiple Vitamins-Minerals (MULTIVITAMIN PO) [id number] Yes Take 1 tablet by mouth daily. [provider] Taking Active Self  Omega-3 1000 MG CAPS [id number] Yes Take by mouth. [provider] Taking Active   pregabalin (LYRICA) 75 MG capsule [id number] Yes Take 1 capsule (75 mg total) by mouth 2 (two) times daily. Steele Sizer, MD Taking Active   SOLIQUA 100-33 UNT-MCG/ML SOPN [id number] Yes INJECT 10 TO 50 UNITS SUBCUTANEOUSLY ONCE DAILY Steele Sizer, MD Taking Active            Med Note Clemetine Marker D   Thu Jul 16, 2021  3:40 PM) Pt taking 30 units daily 8.4.22  tiZANidine (ZANAFLEX) 2 MG tablet [id number] Yes 1 po tid prn [provider] Taking Active   traMADol (ULTRAM) 50 MG tablet [id number] Yes Take 1 tablet by mouth 2 (two) times daily as needed.  Sharlet Salina, MD Taking Active   traZODone (DESYREL) 50 MG tablet [id number] Yes TAKE 1/2 TO 1 TABLET AT BEDTIME AS NEEDED FOR SLEEP Steele Sizer, MD Taking Active   Turmeric 500 MG CAPS [id number] Yes Take by mouth. [provider] Taking Active Self  vitamin B-12 (CYANOCOBALAMIN) 500 MCG tablet [id number] Yes Take 1,000 mcg  by mouth daily. [provider] Taking Active Self            Patient Active Problem List   Diagnosis Date Noted   Bilateral leg edema 05/01/2020   SOBOE (shortness of  breath on exertion) 05/01/2020   Colon cancer, ascending ([place]) 05/25/2019   Mild episode of recurrent major depressive disorder ([place]) 08/01/2018   SBO (small bowel obstruction) and ileocolonic anastomosis from recent colectomy 05/22/2018   Hypomagnesemia 05/21/2018   Ileus following gastrointestinal surgery ([place]) 05/20/2018   Essential hemorrhagic thrombocythemia ([place]) 03/29/2018   Hypercalcemia 11/23/2015   DDD (degenerative disc disease), lumbar 07/28/2015   Spinal stenosis at L4-L5 level 06/12/2015   Benign essential HTN 05/25/2015   Bunion 05/25/2015   Cardiac enlargement 05/25/2015   Cervical radicular pain 05/25/2015   Back pain, chronic 05/25/2015   Osteoarthritis 05/25/2015   Dyslipidemia 05/25/2015   Gastric reflux 05/25/2015   Insomnia 05/25/2015   Eczema intertrigo 05/25/2015   Bronchitis, chronic, mucopurulent ([place]) 05/25/2015   Numerous moles 05/25/2015   Morbid obesity ([place]) 05/25/2015   Perennial allergic rhinitis 05/25/2015   DM (diabetes mellitus), type 2, uncontrolled, with renal complications 50/07/3817   Snores 05/25/2015   History of artificial joint 05/25/2015   Degeneration of intervertebral disc of cervical region 02/06/2015   Neuritis or radiculitis due to rupture of lumbar intervertebral disc 01/14/2015   Abnormal presence of protein in urine 05/04/2010   Vitamin D deficiency 05/04/2010    Immunization History  Administered Date(s) Administered   Fluad Quad(high Dose 65+) 11/12/2019, 09/03/2020, 08/21/2021   Influenza, High Dose Seasonal PF 08/29/2015, 08/25/2016, 08/01/2018   Influenza, Seasonal, Injecte, Preservative Fre 08/03/2011, 08/21/2012   Influenza,inj,Quad PF,6+ Mos 08/13/2013, 07/17/2014   Influenza-Unspecified 07/17/2014, 08/31/2017   PFIZER(Purple  Top)SARS-COV-2 Vaccination 01/08/2020, 01/29/2020, 08/27/2020, 03/19/2021   PPD Test 10/08/2015   Pneumococcal Conjugate-13 07/17/2014   Pneumococcal Polysaccharide-23 05/04/2010, 08/29/2015   Tdap 05/04/2010   Zoster Recombinat (Shingrix) 09/18/2018, 11/27/2018   Zoster, Live 12/25/2010    Conditions to be addressed/monitored:  Hypertension, Hyperlipidemia, Diabetes, COPD, Depression, Anxiety and Chronic Pain, Insomnia  Care Plan : General Pharmacy (Adult)  Updates made by Germaine Pomfret, RPH since 10/26/2021 12:00 AM     Problem: Hypertension, Hyperlipidemia, Diabetes, COPD, Depression, Anxiety and Chronic Pain, Insomnia   Priority: High     Long-Range Goal: Patient-Specific Goal   Start Date: 02/05/2021  Expected End Date: 07/14/2022  This Visit's Progress: On track  Recent Progress: On track  Priority: High  Note:   Current Barriers:  Unable to independently afford treatment regimen Unable to achieve control of Diabetes   Pharmacist Clinical Goal(s):  Over the next 90 days, patient will achieve control of diabetes as evidenced by A1c less than 8% through collaboration with PharmD and provider.   Interventions: 1:1 collaboration with Steele Sizer, MD regarding development and update of comprehensive plan of care as evidenced by provider attestation and co-signature Inter-disciplinary care team collaboration (see longitudinal plan of care) Comprehensive medication review performed; medication list updated in electronic medical record  Diabetes (A1c goal <8%) -Controlled -Current medications: Soliqua 30 units at breakfast Metformin XR 750 mg 2 tablets daily  -Medications previously tried: Actos (hypoglycemia), glipizide, Invokana, Jardiance,   -Current home glucose readings -Reports hypoglycemic symptoms: Woozy, weakness. Occurs when she hasn't eaten anything.  -Current dietary patterns: Patient feels she has been struggling with a low-carbohydrate diet for her  diabetes. She reports some additional weight gain over the past few months.  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 -Patient instructed on proper use of Trulicity. Vania Rea will not be able to be started until 2023 when LIS is  approved.   Hypertension (BP goal <140/90) -Controlled -Current treatment: Carvedilol 6.25 mg twice daily  Furosemide 20 mg daily  Lisinopril 40 mg daily  -Medications previously tried: NA  -Current home readings: NA -Denies hypotensive/hypertensive symptoms -Carvedilol increased by cardiology to address edema, patient feels it is unchanged. Counseled to ensure she is elevating legs, minimizing salt intake, and using compression stockings.  -Recommended to continue current medication  Hyperlipidemia: (LDL goal < 70) -Controlled -Current treatment: Atorvastatin 80 mg daily  -Current antiplatelet treatment: Aspirin 81 mg daily  -Medications previously tried: NA  -Educated on Importance of limiting foods high in cholesterol; -Recommended to continue current medication  COPD (Goal: control symptoms and prevent exacerbations) -Controlled -Current treatment  Ventolin HFA 2 puffs every 6 hours as needed  Bevespi 2 puffs at bedtime  -Medications previously tried: NA  -Gold Grade: Gold 1 (FEV1>80%) -Current COPD Classification:  A (low sx, <2 exacerbations/yr) -MMRC/CAT score: NA -Pulmonary function testing: FEV1 92% (Jun 2021) -Exacerbations requiring treatment in last 6 months: None -Patient reports consistent use of maintenance inhaler -Frequency of rescue inhaler use: Infrequently -Counseled on Benefits of consistent maintenance inhaler use -Recommended to continue current medication  Insomnia (Goal: Improve sleep quality) -Controlled -Current treatment: Trazodone 50 mg 1/2 - 1 tablet nightly as needed - Sleep -Medications previously tried/failed:  NA -PHQ9: 4 -GAD7: NA -Educated on Benefits of medication for symptom control -Recommended to continue current medication  Patient Goals/Self-Care Activities Over the next 90 days, patient will:  - check glucose daily before breakfast, document, and provide at future appointments  Follow Up Plan: Telephone follow up appointment with care management team member scheduled for:  11/25/2021 at 9:15 AM      Medication Assistance:  Trulicity obtained through [employer] medication assistance program.  Enrollment ends Dec 2022 [place] obtained through [place] medication assistance program. Vania Rea not approved through patient assistance as patient qualified for LIS.   Patient's preferred pharmacy is:  [place], [place] [address] Phone: [phone] Fax: [phone]  [facility] [address]), [place] - [place]) [place] [address] Phone: [phone] Fax: [place], [place]. [place] [address] Phone: [phone] Fax: [phone]  Uses pill box? Yes Pt endorses 100% compliance  We discussed: Current pharmacy is preferred with insurance plan and patient is satisfied with pharmacy services Patient decided to: Continue current medication management strategy  Care Plan and Follow Up Patient Decision:  Patient agrees to Care Plan and Follow-up.  Plan: Telephone follow up appointment with care management team member scheduled for:  11/25/2021 at [place], [place], [place] Pharmacist Practitioner  [facility] [phone]

## 2021-10-28 DIAGNOSIS — Z794 Long term (current) use of insulin: Secondary | ICD-10-CM | POA: Diagnosis not present

## 2021-10-28 DIAGNOSIS — I10 Essential (primary) hypertension: Secondary | ICD-10-CM

## 2021-10-28 DIAGNOSIS — E1169 Type 2 diabetes mellitus with other specified complication: Secondary | ICD-10-CM | POA: Diagnosis not present

## 2021-10-28 DIAGNOSIS — J411 Mucopurulent chronic bronchitis: Secondary | ICD-10-CM

## 2021-10-28 DIAGNOSIS — Z7984 Long term (current) use of oral hypoglycemic drugs: Secondary | ICD-10-CM

## 2021-11-19 ENCOUNTER — Telehealth: Payer: Self-pay

## 2021-11-19 NOTE — Progress Notes (Signed)
APPOINTMENT REMINDER   Cindy Griffin was reminded to have all medications, supplements and any blood glucose and blood pressure readings available for review with Junius Argyle, Pharm. D, at her telephone visit on 11/25/2021 at 9:15 am.  Patient Confirm Appointment.  [place] Pharmacist Assistant [phone]

## 2021-11-25 ENCOUNTER — Ambulatory Visit (INDEPENDENT_AMBULATORY_CARE_PROVIDER_SITE_OTHER): Payer: Medicare HMO

## 2021-11-25 DIAGNOSIS — I10 Essential (primary) hypertension: Secondary | ICD-10-CM

## 2021-11-25 DIAGNOSIS — Z794 Long term (current) use of insulin: Secondary | ICD-10-CM

## 2021-11-25 NOTE — Patient Instructions (Signed)
Visit Information It was great speaking with you today!  Please let me know if you have any questions about our visit.   Goals Addressed             This Visit's Progress    Monitor and Manage My Blood Sugar-Diabetes Type 2   On track    Timeframe:  Long-Range Goal Priority:  High Start Date:  02/05/2021                           Expected End Date:  08/08/2022                     Follow Up within 60 days   -Check blood sugar daily before breakfast - check blood sugar if I feel it is too high or too low - enter blood sugar readings and medication or insulin into daily log - take the blood sugar log to all doctor visits    Why is this important?   Checking your blood sugar at home helps to keep it from getting very high or very low.  Writing the results in a diary or log helps the doctor know how to care for you.  Your blood sugar log should have the time, date and the results.  Also, write down the amount of insulin or other medicine that you take.  Other information, like what you ate, exercise done and how you were feeling, will also be helpful.     Notes:         Patient Care Plan: General Pharmacy (Adult)     Problem Identified: Hypertension, Hyperlipidemia, Diabetes, COPD, Depression, Anxiety and Chronic Pain, Insomnia   Priority: High     Long-Range Goal: Patient-Specific Goal   Start Date: 02/05/2021  Expected End Date: 07/14/2022  This Visit's Progress: On track  Recent Progress: On track  Priority: High  Note:   Current Barriers:  Unable to independently afford treatment regimen Unable to achieve control of Diabetes   Pharmacist Clinical Goal(s):  Over the next 90 days, patient will achieve control of diabetes as evidenced by A1c less than 8% through collaboration with PharmD and provider.   Interventions: 1:1 collaboration with Steele Sizer, MD regarding development and update of comprehensive plan of care as evidenced by provider attestation and  co-signature Inter-disciplinary care team collaboration (see longitudinal plan of care) Comprehensive medication review performed; medication list updated in electronic medical record  Diabetes (A1c goal <8%) -Controlled -Current medications: Trulicity 3 mg weekly  Metformin XR 750 mg 2 tablets daily  -Medications previously tried: Actos (hypoglycemia), glipizide, Invokana, Soliqua,   -She does report nausea since starting Trulicity, counseled on managing symptoms.  -Current home glucose readings:  118,113,108, 153 (ice cream), 90, 82 (hadn't eaten), 133,  -Reports hypoglycemic symptoms: some light-headedness.  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 -Continue current medications  Hypertension (BP goal <140/90) -Controlled -Current treatment: Carvedilol 6.25 mg twice daily  Furosemide 20 mg daily  Lisinopril 40 mg daily  -Medications previously tried: NA  -Current home readings: NA -Denies hypotensive/hypertensive symptoms -Recommended to continue current medication  Hyperlipidemia: (LDL goal < 70) -Controlled -Current treatment: Atorvastatin 80 mg daily  -Current antiplatelet treatment: Aspirin 81 mg daily  -Medications previously tried: NA  -Educated on Importance of limiting foods high  in cholesterol; -Recommended to continue current medication  COPD (Goal: control symptoms and prevent exacerbations) -Controlled -Current treatment  Ventolin HFA 2 puffs every 6 hours as needed  Bevespi 2 puffs at bedtime  -Medications previously tried: NA  -Gold Grade: Gold 1 (FEV1>80%) -Current COPD Classification:  A (low sx, <2 exacerbations/yr) -MMRC/CAT score: NA -Pulmonary function testing: FEV1 92% (Jun 2021) -Exacerbations requiring treatment in last 6 months: None -Patient reports consistent use of  maintenance inhaler -Frequency of rescue inhaler use: Infrequently -Counseled on Benefits of consistent maintenance inhaler use -Recommended to continue current medication  Insomnia (Goal: Improve sleep quality) -Controlled -Current treatment: Trazodone 50 mg 1/2 - 1 tablet nightly as needed - Sleep -Medications previously tried/failed: NA -PHQ9: 4 -GAD7: NA -Educated on Benefits of medication for symptom control -Recommended to continue current medication  Patient Goals/Self-Care Activities Over the next 90 days, patient will:  - check glucose daily before breakfast, document, and provide at future appointments  Follow Up Plan: Telephone follow up appointment with care management team member scheduled for:  03/03/22 at 9:15 AM    Patient agreed to services and verbal consent obtained.   Patient verbalizes understanding of instructions provided today and agrees to view in [place].   Malva Limes, [place] Pharmacist Practitioner  [facility] [phone]

## 2021-11-25 NOTE — Progress Notes (Signed)
Chronic Care Management Pharmacy Note  11/25/2021 Name:  Cindy Griffin MRN:  [id number] DOB:  [birth year]  Summary: Patient presents for CCM follow-up. Her blood sugars are well controlled, but she does report nausea since starting Trulicity  Recommendations/Changes made from today's visit: -Continue current medications  Plan: CPP follow-up 4 months  Recommended Problem List Changes:   Modify: Type 2 diabetes mellitus with diabetic polyneuropathy, without long-term current use of insulin (HCC)  Subjective: Cindy Griffin is an 75 y.o. year old female who is a primary patient of Cindy Sizer, MD.  The CCM team was consulted for assistance with disease management and care coordination needs.    Engaged with patient by telephone for follow up visit in response to provider referral for pharmacy case management and/or care coordination services.   Consent to Services:  The patient was given information about Chronic Care Management services, agreed to services, and gave verbal consent prior to initiation of services.  Please see initial visit note for detailed documentation.   Patient Care Team: Cindy Sizer, MD as PCP - General (Family Medicine) Cindy Salina, MD as Consulting Physician (Physical Medicine and Rehabilitation) Cindy Morse, MD as Consulting Physician (Orthopedic Surgery) Cindy Roup, MD as Consulting Physician (General Surgery) Cindy Griffin, [facility] as Pharmacist (Pharmacist) Cindy Skains, MD as Consulting Physician (Cardiology) Cindy Pian, MD as Referring Physician (Pulmonary Disease) Cindy Sickle, MD as Consulting Physician (Hematology and Oncology)  Recent office visits: 08/21/21: Patient presented to Dr. Ancil Boozer for follow-up. A1c 7.5%. BP 128/74.  07/16/21: Patient presented to Clemetine Marker, LPN for AWV.  Recent consult visits: 08/04/21: Patient presented to Dr. Nehemiah Massed (Cardiology) for follow-up. Carvedilol increased  to 6.25 mg twice daily.   Hospital visits: None in previous 6 months  Objective:  Lab Results  Component Value Date   CREATININE 0.86 12/10/2020   BUN 15 12/10/2020   GFRNONAA 67 12/10/2020   GFRAA 77 12/10/2020   NA 141 12/10/2020   K 4.4 12/10/2020   CALCIUM 10.3 12/10/2020   CO2 28 12/10/2020    Lab Results  Component Value Date/Time   HGBA1C 7.5 (A) 08/21/2021 10:14 AM   HGBA1C 6.6 (A) 04/15/2021 09:40 AM   HGBA1C 9.3 (A) 07/03/2019 09:19 AM   HGBA1C 6.9 03/01/2019 10:05 AM   FRUCTOSAMINE 264 12/27/2017 09:56 AM   MICROALBUR <0.2 12/10/2020 12:00 AM   MICROALBUR 0.6 11/12/2019 12:00 AM   MICROALBUR 50 07/22/2017 09:00 AM   MICROALBUR 50 07/15/2016 10:52 AM    Last diabetic Eye exam:  Lab Results  Component Value Date/Time   HMDIABEYEEXA No Retinopathy 10/08/2020 12:00 AM    Last diabetic Foot exam: No results found for: HMDIABFOOTEX   Lab Results  Component Value Date   CHOL 127 12/10/2020   HDL 56 12/10/2020   LDLCALC 52 12/10/2020   TRIG 104 12/10/2020   CHOLHDL 2.3 12/10/2020    Hepatic Function Latest Ref Rng & Units 12/10/2020 06/23/2020 12/21/2019  Total Protein 6.1 - 8.1 g/dL 7.4 7.4 7.3  Albumin 3.5 - 5.0 g/dL - 4.0 4.2  AST 10 - 35 U/L _0 ALT 6 - 29 U/L _1 Alk Phosphatase 38 - 126 U/L - 76 77  Total Bilirubin 0.2 - 1.2 mg/dL 0.3 0.5 0.6    No results found for: TSH, FREET4  CBC Latest Ref Rng & Units 12/10/2020 09/09/2020 06/23/2020  WBC 3.8 - 10.8 Thousand/uL 6.7 6.7 7.3  Hemoglobin 11.7 - 15.5 g/dL  13.2 12.3 11.8(L)  Hematocrit 35.0 - 45.0 % 39.2 38.8 36.0  Platelets 140 - 400 Thousand/uL 344 292 282    Lab Results  Component Value Date/Time   VD25OH 30 12/10/2020 12:00 AM   VD25OH 51 10/31/2018 12:06 PM    Clinical ASCVD: No  The ASCVD Risk score (Arnett DK, et al., 2019) failed to calculate for the following reasons:   The valid total cholesterol range is 130 to 320 mg/dL    Depression screen [facility] 2/9 08/21/2021  07/16/2021 04/15/2021  Decreased Interest 0 0 0  Down, Depressed, Hopeless 0 1 0  PHQ - 2 Score 0 1 0  Altered sleeping 2 1 0  Tired, decreased energy _0 Change in appetite 1 0 1  Feeling bad or failure about yourself  0 0 0  Trouble concentrating 0 1 0  Moving slowly or fidgety/restless 0 0 0  Suicidal thoughts 0 0 0  PHQ-9 Score _1 Difficult doing work/chores Not difficult at all Not difficult at all -  Some recent data might be hidden    Social History   Tobacco Use  Smoking Status Never  Smokeless Tobacco Never  Tobacco Comments   smoking cessation materials not required   BP Readings from Last 3 Encounters:  08/21/21 128/74  07/16/21 128/76  06/26/21 132/70   Pulse Readings from Last 3 Encounters:  08/21/21 81  07/16/21 89  06/26/21 80   Wt Readings from Last 3 Encounters:  08/21/21 248 lb 11.2 oz (112.8 kg)  07/16/21 246 lb 12.8 oz (111.9 kg)  06/26/21 250 lb (113.4 kg)    Assessment/Interventions: Review of patient past medical history, allergies, medications, health status, including review of consultants reports, laboratory and other test data, was performed as part of comprehensive evaluation and provision of chronic care management services.   SDOH:  (Social Determinants of Health) assessments and interventions performed: Yes SDOH Interventions    Flowsheet Row Most Recent Value  SDOH Interventions   Financial Strain Interventions Intervention Not Indicated           CCM Care Plan  Allergies  Allergen Reactions   Doxycycline Rash    Itching    Medications Reviewed Today     Reviewed by Cindy Sizer, MD (Physician) on 08/21/21 at 25  Med List Status: <None>   Medication Order Taking? Sig Documenting Provider Last Dose Status Informant  acetaminophen (TYLENOL) 500 MG tablet [id number] Yes Take 1,000 mg by mouth every 6 (six) hours as needed for moderate pain or headache.  [provider] Taking Active Self  albuterol  (VENTOLIN HFA) 108 (90 Base) MCG/ACT inhaler [id number] Yes Inhale 2 puffs into the lungs every 6 (six) hours as needed for wheezing or shortness of breath. Cindy Sizer, MD Taking Active   Ascorbic Acid (VITAMIN C) 1000 MG tablet [id number] Yes Take 1,000 mg by mouth daily. [provider] Taking Active Self  aspirin EC 81 MG tablet [id number] Yes Take 1 tablet (81 mg total) by mouth daily. Cindy Sizer, MD Taking Active Self  atorvastatin (LIPITOR) 80 MG tablet [id number] Yes Take 1 tablet (80 mg total) by mouth daily. Cindy Sizer, MD Taking Active   BD PEN NEEDLE MICRO U/F 32G X 6 MM MISC [id number] Yes USE ONCE DAILY Cindy Sizer, MD Taking Active   BEVESPI AEROSPHERE 9-4.8 MCG/ACT AERO [id number] Yes INHALE 2 PUFFS BY MOUTH AT BEDTIME Cindy Sizer, MD Taking Active   Biotin 5000 MCG CAPS [id number] Yes Take  by mouth. [provider] Taking Active Self  Carboxymethylcellul-Glycerin (LUBRICATING EYE DROPS OP) [id number] Yes Place 2 drops into both eyes 2 (two) times daily as needed (for dry eyes). [provider] Taking Active Self  carvedilol (COREG) 3.125 MG tablet [id number] Yes Take 3.125 mg by mouth 2 (two) times daily with a meal. [provider] Taking Active            Med Note Michaelle Birks, Angela Platner A   Wed Mar 11, 2021  1:26 PM) Prescribed by Dr. Nehemiah Massed  cetirizine (ZYRTEC) 10 MG tablet [id number] Yes Take 10 mg by mouth every other day as needed for allergies.  [provider] Taking Active Self           Med Note Larwance Sachs A   Thu May 06, 2016 12:00 PM)    Cholecalciferol (VITAMIN D-3) 1000 units CAPS [id number] Yes Take 1,000 Units by mouth daily.  [provider] Taking Active Self  famotidine (PEPCID) 20 MG tablet [id number] Yes Take 1 tablet (20 mg total) by mouth 2 (two) times daily. Cindy Sizer, MD Taking Active   fluticasone [facility]) 50 MCG/ACT nasal spray [id number] Yes USE 2 SPRAYS IN EACH NOSTRIL EVERY Eloise Harman, MD Taking Active   furosemide (LASIX) 20 MG tablet [id number] Yes TAKE 1 TABLET EVERY DAY Cindy Sizer, MD Taking Active   Lancets (ONETOUCH DELICA PLUS [id number]) [place] [id number] Yes CHECK GLUCOSE THREE TIMES DAILY [provider] Taking Active   lisinopril (ZESTRIL) 40 MG tablet [id number] Yes Take 1 tablet (40 mg total) by mouth at bedtime. Cindy Sizer, MD Taking Active   meclizine (ANTIVERT) 25 MG tablet [id number] Yes Take 1 tablet (25 mg total) by mouth every 6 (six) hours. Cindy Sizer, MD Taking Active   metFORMIN (GLUCOPHAGE-XR) 750 MG 24 hr tablet [id number] Yes Take 2 tablets (1,500 mg total) by mouth daily with breakfast. Cindy Sizer, MD Taking Active   Multiple Vitamins-Minerals (MULTIVITAMIN PO) [id number] Yes Take 1 tablet by mouth daily. [provider] Taking Active Self  Omega-3 1000 MG CAPS [id number] Yes Take by mouth. [provider] Taking Active   pregabalin (LYRICA) 75 MG capsule [id number] Yes Take 1 capsule (75 mg total) by mouth 2 (two) times daily. Cindy Sizer, MD Taking Active   SOLIQUA 100-33 UNT-MCG/ML SOPN [id number] Yes INJECT 10 TO 50 UNITS SUBCUTANEOUSLY ONCE DAILY Cindy Sizer, MD Taking Active            Med Note Clemetine Marker D   Thu Jul 16, 2021  3:40 PM) Pt taking 30 units daily 8.4.22  tiZANidine (ZANAFLEX) 2 MG tablet [id number] Yes 1 po tid prn [provider] Taking Active   traMADol (ULTRAM) 50 MG tablet [id number] Yes Take 1 tablet by mouth 2 (two) times daily as needed.  Cindy Salina, MD Taking Active   traZODone (DESYREL) 50 MG tablet [id number] Yes TAKE 1/2 TO 1 TABLET AT BEDTIME AS NEEDED FOR SLEEP Cindy Sizer, MD Taking Active   Turmeric 500 MG CAPS [id number] Yes Take by mouth. [provider] Taking Active Self  vitamin B-12 (CYANOCOBALAMIN) 500 MCG tablet [id number] Yes Take 1,000 mcg by mouth daily. [provider] Taking Active Self            Patient  Active Problem List   Diagnosis Date Noted   Bilateral leg edema 05/01/2020   SOBOE (shortness of breath on exertion) 05/01/2020   Colon cancer, ascending ([place]) 05/25/2019   Mild episode of recurrent major depressive  disorder ([place]) 08/01/2018   SBO (small bowel obstruction) and ileocolonic anastomosis from recent colectomy 05/22/2018   Hypomagnesemia 05/21/2018   Ileus following gastrointestinal surgery ([place]) 05/20/2018   Essential hemorrhagic thrombocythemia ([place]) 03/29/2018   Hypercalcemia 11/23/2015   DDD (degenerative disc disease), lumbar 07/28/2015   Spinal stenosis at L4-L5 level 06/12/2015   Benign essential HTN 05/25/2015   Bunion 05/25/2015   Cardiac enlargement 05/25/2015   Cervical radicular pain 05/25/2015   Back pain, chronic 05/25/2015   Osteoarthritis 05/25/2015   Dyslipidemia 05/25/2015   Gastric reflux 05/25/2015   Insomnia 05/25/2015   Eczema intertrigo 05/25/2015   Bronchitis, chronic, mucopurulent ([place]) 05/25/2015   Numerous moles 05/25/2015   Morbid obesity ([place]) 05/25/2015   Perennial allergic rhinitis 05/25/2015   DM (diabetes mellitus), type 2, uncontrolled, with renal complications 16/60/6301   Snores 05/25/2015   History of artificial joint 05/25/2015   Degeneration of intervertebral disc of cervical region 02/06/2015   Neuritis or radiculitis due to rupture of lumbar intervertebral disc 01/14/2015   Abnormal presence of protein in urine 05/04/2010   Vitamin D deficiency 05/04/2010    Immunization History  Administered Date(s) Administered   Fluad Quad(high Dose 65+) 11/12/2019, 09/03/2020, 08/21/2021   Influenza, High Dose Seasonal PF 08/29/2015, 08/25/2016, 08/01/2018   Influenza, Seasonal, Injecte, Preservative Fre 08/03/2011, 08/21/2012   Influenza,inj,Quad PF,6+ Mos 08/13/2013, 07/17/2014   Influenza-Unspecified 07/17/2014, 08/31/2017   PFIZER(Purple Top)SARS-COV-2 Vaccination 01/08/2020, 01/29/2020, 08/27/2020, 03/19/2021   PPD Test  10/08/2015   Pneumococcal Conjugate-13 07/17/2014   Pneumococcal Polysaccharide-23 05/04/2010, 08/29/2015   Tdap 05/04/2010   Zoster Recombinat (Shingrix) 09/18/2018, 11/27/2018   Zoster, Live 12/25/2010    Conditions to be addressed/monitored:  Hypertension, Hyperlipidemia, Diabetes, COPD, Depression, Anxiety and Chronic Pain, Insomnia  Care Plan : General Pharmacy (Adult)  Updates made by Cindy Griffin, RPH since 11/25/2021 12:00 AM     Problem: Hypertension, Hyperlipidemia, Diabetes, COPD, Depression, Anxiety and Chronic Pain, Insomnia   Priority: High     Long-Range Goal: Patient-Specific Goal   Start Date: 02/05/2021  Expected End Date: 07/14/2022  This Visit's Progress: On track  Recent Progress: On track  Priority: High  Note:   Current Barriers:  Unable to independently afford treatment regimen Unable to achieve control of Diabetes   Pharmacist Clinical Goal(s):  Over the next 90 days, patient will achieve control of diabetes as evidenced by A1c less than 8% through collaboration with PharmD and provider.   Interventions: 1:1 collaboration with Cindy Sizer, MD regarding development and update of comprehensive plan of care as evidenced by provider attestation and co-signature Inter-disciplinary care team collaboration (see longitudinal plan of care) Comprehensive medication review performed; medication list updated in electronic medical record  Diabetes (A1c goal <8%) -Controlled -Current medications: Trulicity 3 mg weekly  Metformin XR 750 mg 2 tablets daily  -Medications previously tried: Actos (hypoglycemia), glipizide, Invokana, Soliqua,   -She does report nausea since starting Trulicity, counseled on managing symptoms.  -Current home glucose readings:  118,113,108, 153 (ice cream), 90, 82 (hadn't eaten), 133,  -Reports hypoglycemic symptoms: some light-headedness.  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 -Continue current medications  Hypertension (BP goal <140/90) -Controlled -Current treatment: Carvedilol 6.25 mg twice daily  Furosemide 20 mg daily  Lisinopril 40 mg daily  -Medications previously tried: NA  -Current home readings: NA -Denies hypotensive/hypertensive symptoms -Recommended  to continue current medication  Hyperlipidemia: (LDL goal < 70) -Controlled -Current treatment: Atorvastatin 80 mg daily  -Current antiplatelet treatment: Aspirin 81 mg daily  -Medications previously tried: NA  -Educated on Importance of limiting foods high in cholesterol; -Recommended to continue current medication  COPD (Goal: control symptoms and prevent exacerbations) -Controlled -Current treatment  Ventolin HFA 2 puffs every 6 hours as needed  Bevespi 2 puffs at bedtime  -Medications previously tried: NA  -Gold Grade: Gold 1 (FEV1>80%) -Current COPD Classification:  A (low sx, <2 exacerbations/yr) -MMRC/CAT score: NA -Pulmonary function testing: FEV1 92% (Jun 2021) -Exacerbations requiring treatment in last 6 months: None -Patient reports consistent use of maintenance inhaler -Frequency of rescue inhaler use: Infrequently -Counseled on Benefits of consistent maintenance inhaler use -Recommended to continue current medication  Insomnia (Goal: Improve sleep quality) -Controlled -Current treatment: Trazodone 50 mg 1/2 - 1 tablet nightly as needed - Sleep -Medications previously tried/failed: NA -PHQ9: 4 -GAD7: NA -Educated on Benefits of medication for symptom control -Recommended to continue current medication  Patient Goals/Self-Care Activities Over the next 90 days, patient will:  - check glucose daily before breakfast, document, and provide at future appointments  Follow Up Plan: Telephone follow up appointment with care management  team member scheduled for:  03/03/22 at 9:15 AM    Medication Assistance:  Trulicity obtained through [employer] medication assistance program.  Enrollment ends Dec 2023 Bevespi obtained through [place] medication assistance program.  Patient's preferred pharmacy is:  [facility] [place], [place] [address] Phone: [phone] Fax: [phone]  [place] [address] [place]) [place] [address] Phone: [phone] Fax: [place], [place] [address] [place] [address] Phone: [phone] Fax: [phone]   Uses pill box? Yes Pt endorses 100% compliance  We discussed: Current pharmacy is preferred with insurance plan and patient is satisfied with pharmacy services Patient decided to: Continue current medication management strategy  Care Plan and Follow Up Patient Decision:  Patient agrees to Care Plan and Follow-up.  Plan: Telephone follow up appointment with care management team member scheduled for:  03/03/22 at [place], [place], [place] Pharmacist Practitioner  [facility] [phone]

## 2021-11-27 ENCOUNTER — Other Ambulatory Visit: Payer: Self-pay

## 2021-11-27 DIAGNOSIS — E1142 Type 2 diabetes mellitus with diabetic polyneuropathy: Secondary | ICD-10-CM

## 2021-11-27 MED ORDER — PREGABALIN 75 MG PO CAPS: 75.0000 mg | ORAL_CAPSULE | Freq: Two times a day (BID) | ORAL | 1 refills | Status: DC

## 2021-11-28 DIAGNOSIS — E785 Hyperlipidemia, unspecified: Secondary | ICD-10-CM | POA: Diagnosis not present

## 2021-11-28 DIAGNOSIS — I10 Essential (primary) hypertension: Secondary | ICD-10-CM

## 2021-11-28 DIAGNOSIS — J449 Chronic obstructive pulmonary disease, unspecified: Secondary | ICD-10-CM | POA: Diagnosis not present

## 2021-11-28 DIAGNOSIS — E1169 Type 2 diabetes mellitus with other specified complication: Secondary | ICD-10-CM

## 2021-12-04 ENCOUNTER — Telehealth: Payer: Self-pay | Admitting: Internal Medicine

## 2021-12-04 ENCOUNTER — Telehealth: Payer: Self-pay

## 2021-12-04 NOTE — Telephone Encounter (Signed)
Pt called to reschedule her appt for 1-27. Call back at [phone]

## 2021-12-04 NOTE — Telephone Encounter (Signed)
3 YR RECALL-Patient is ready to schedule procedure. Clinical staff will follow up with patient.

## 2021-12-04 NOTE — Telephone Encounter (Signed)
LVM for pt to call me directly to reschedule appt

## 2021-12-04 NOTE — Telephone Encounter (Signed)
Called left a message for patient to call us back when she is available

## 2021-12-07 ENCOUNTER — Other Ambulatory Visit: Payer: Self-pay

## 2021-12-07 ENCOUNTER — Telehealth: Payer: Self-pay | Admitting: Internal Medicine

## 2021-12-07 DIAGNOSIS — Z86010 Personal history of colonic polyps: Secondary | ICD-10-CM

## 2021-12-07 MED ORDER — PEG 3350-KCL-NA BICARB-NACL 420 G PO SOLR: 4000.0000 mL | Freq: Once | ORAL | 0 refills | Status: AC

## 2021-12-07 NOTE — Telephone Encounter (Signed)
Pt has called again to reschedule her appt for 1-27. Call back at [phone]

## 2021-12-07 NOTE — Telephone Encounter (Signed)
Yes I left her my phone number to call me back on Friday. I will call again

## 2021-12-07 NOTE — Progress Notes (Signed)
Gastroenterology Pre-Procedure Review  Request Date: 01/22/2022 Requesting Physician: Dr. Vicente Males  PATIENT REVIEW QUESTIONS: The patient responded to the following health history questions as indicated:    1. Are you having any GI issues? no 2. Do you have a personal history of Polyps? yes (LAST COLONOSCOPY) 3. Do you have a family history of Colon Cancer or Polyps? yes (OLDEST SON POLYPS) 4. Diabetes Mellitus? yes (TYPE 2) 5. Joint replacements in the past 12 months?no 6. Major health problems in the past 3 months?no 7. Any artificial heart valves, MVP, or defibrillator?no    MEDICATIONS & ALLERGIES:    Patient reports the following regarding taking any anticoagulation/antiplatelet therapy:   Plavix, Coumadin, Eliquis, Xarelto, Lovenox, Pradaxa, Brilinta, or Effient? no Aspirin? no  Patient confirms/reports the following medications:  Current Outpatient Medications  Medication Sig Dispense Refill   acetaminophen (TYLENOL) 500 MG tablet Take 1,000 mg by mouth every 6 (six) hours as needed for moderate pain or headache.      albuterol (VENTOLIN HFA) 108 (90 Base) MCG/ACT inhaler INHALE 2 PUFFS INTO THE LUNGS EVERY 6 HOURS AS NEEDED FOR WHEEZING OR SHORTNESS OF BREATH. 2 each 0   Ascorbic Acid (VITAMIN C) 1000 MG tablet Take 1,000 mg by mouth daily.     aspirin EC 81 MG tablet Take 1 tablet (81 mg total) by mouth daily. 90 tablet 1   atorvastatin (LIPITOR) 80 MG tablet TAKE 1 TABLET EVERY DAY 90 tablet 1   BD PEN NEEDLE MICRO U/F 32G X 6 MM MISC USE ONCE DAILY 100 each 2   Biotin 5000 MCG CAPS Take by mouth.     Carboxymethylcellul-Glycerin (LUBRICATING EYE DROPS OP) Place 2 drops into both eyes 2 (two) times daily as needed (for dry eyes).     carvedilol (COREG) 3.125 MG tablet Take 3.125 mg by mouth 2 (two) times daily with a meal.     cetirizine (ZYRTEC) 10 MG tablet Take 10 mg by mouth every other day as needed for allergies.      Cholecalciferol (VITAMIN D-3) 1000 units CAPS Take  1,000 Units by mouth daily.      Dulaglutide (TRULICITY) 3 QI/3.4VQ SOPN Inject 3 mg as directed once a week. Patient receives through [employer] Patient Assistance through Dec 2023     famotidine (PEPCID) 20 MG tablet TAKE 1 TABLET TWICE DAILY 180 tablet 1   fluticasone (FLONASE) 50 MCG/ACT nasal spray USE 2 SPRAYS IN EACH NOSTRIL EVERY DAY 48 g 1   furosemide (LASIX) 20 MG tablet TAKE 1 TABLET EVERY DAY 90 tablet 1   Glycopyrrolate-Formoterol (BEVESPI AEROSPHERE) 9-4.8 MCG/ACT AERO INHALE 2 PUFFS BY MOUTH AT BEDTIME. Patient receives through [employer] Patient Assistance 11 g 11   Lancets (ONETOUCH DELICA PLUS [id number]) MISC CHECK GLUCOSE THREE TIMES DAILY     lisinopril (ZESTRIL) 40 MG tablet TAKE 1 TABLET (40 MG TOTAL) BY MOUTH AT BEDTIME. 90 tablet 1   meclizine (ANTIVERT) 25 MG tablet Take 1 tablet (25 mg total) by mouth every 6 (six) hours. 30 tablet 0   metFORMIN (GLUCOPHAGE-XR) 750 MG 24 hr tablet TAKE 2 TABLETS (1,500 MG TOTAL) BY MOUTH DAILY WITH BREAKFAST. 180 tablet 1   Multiple Vitamins-Minerals (MULTIVITAMIN PO) Take 1 tablet by mouth daily.     Omega-3 1000 MG CAPS Take by mouth.     pregabalin (LYRICA) 75 MG capsule Take 1 capsule (75 mg total) by mouth 2 (two) times daily. 180 capsule 1   tiZANidine (ZANAFLEX) 2 MG tablet 1 po  tid prn     traMADol (ULTRAM) 50 MG tablet Take 1 tablet by mouth 2 (two) times daily as needed.   3   traZODone (DESYREL) 50 MG tablet TAKE 1/2 TO 1 TABLET AT BEDTIME AS NEEDED FOR SLEEP 90 tablet 0   Turmeric 500 MG CAPS Take by mouth.     vitamin B-12 (CYANOCOBALAMIN) 500 MCG tablet Take 1,000 mcg by mouth daily.     No current facility-administered medications for this visit.    Patient confirms/reports the following allergies:  Allergies  Allergen Reactions   Doxycycline Rash    Itching    No orders of the defined types were placed in this encounter.   AUTHORIZATION INFORMATION Primary Insurance: 1D#: Group #:  Secondary  Insurance: 1D#: Group #:  SCHEDULE INFORMATION: Date: 01/22/2022 Time: Location: [place]

## 2021-12-21 ENCOUNTER — Ambulatory Visit: Payer: Medicare HMO | Admitting: Family Medicine

## 2021-12-21 DIAGNOSIS — M5412 Radiculopathy, cervical region: Secondary | ICD-10-CM | POA: Diagnosis not present

## 2021-12-21 DIAGNOSIS — M7542 Impingement syndrome of left shoulder: Secondary | ICD-10-CM | POA: Diagnosis not present

## 2021-12-21 DIAGNOSIS — M5416 Radiculopathy, lumbar region: Secondary | ICD-10-CM | POA: Diagnosis not present

## 2021-12-21 DIAGNOSIS — M5136 Other intervertebral disc degeneration, lumbar region: Secondary | ICD-10-CM | POA: Diagnosis not present

## 2021-12-21 DIAGNOSIS — M1611 Unilateral primary osteoarthritis, right hip: Secondary | ICD-10-CM | POA: Diagnosis not present

## 2021-12-21 DIAGNOSIS — M5030 Other cervical disc degeneration, unspecified cervical region: Secondary | ICD-10-CM | POA: Diagnosis not present

## 2021-12-21 DIAGNOSIS — M4802 Spinal stenosis, cervical region: Secondary | ICD-10-CM | POA: Diagnosis not present

## 2021-12-23 ENCOUNTER — Other Ambulatory Visit: Payer: Self-pay | Admitting: Family Medicine

## 2021-12-23 DIAGNOSIS — J3089 Other allergic rhinitis: Secondary | ICD-10-CM

## 2021-12-23 NOTE — Telephone Encounter (Signed)
Requested Prescriptions  Pending Prescriptions Disp Refills   fluticasone (FLONASE) 50 MCG/ACT nasal spray [Pharmacy Med Name: FLUTICASONE PROPIONATE 50 MCG/ACT Suspension] 48 g 1    Sig: USE 2 SPRAYS IN EACH NOSTRIL EVERY DAY     Ear, Nose, and Throat: Nasal Preparations - Corticosteroids Passed - 12/23/2021  7:30 AM      Passed - Valid encounter within last 12 months    Recent Outpatient Visits          4 months ago Type 2 diabetes mellitus with other specified complication, with long-term current use of insulin [facility])   [facility] [place], Drue Stager, MD   8 months ago Diabetes mellitus type 2 in obese [facility])   [facility] [place], Drue Stager, MD   1 year ago DM (diabetes mellitus), type 2, uncontrolled, with renal complications [facility])   [facility] [place], Drue Stager, MD   1 year ago DM (diabetes mellitus), type 2, uncontrolled, with renal complications [facility])   [facility] [place], Drue Stager, MD   1 year ago DM (diabetes mellitus), type 2, uncontrolled, with renal complications [facility])   [facility] Steele Sizer, MD      Future Appointments            In 1 week Steele Sizer, MD [facility], [place]   In 6 months  [place]

## 2021-12-24 ENCOUNTER — Inpatient Hospital Stay: Payer: Medicare HMO | Attending: Internal Medicine

## 2021-12-24 ENCOUNTER — Other Ambulatory Visit: Payer: Self-pay

## 2021-12-24 ENCOUNTER — Inpatient Hospital Stay: Payer: Medicare HMO | Admitting: Internal Medicine

## 2021-12-24 ENCOUNTER — Encounter: Payer: Self-pay | Admitting: Internal Medicine

## 2021-12-24 DIAGNOSIS — C182 Malignant neoplasm of ascending colon: Secondary | ICD-10-CM | POA: Insufficient documentation

## 2021-12-24 NOTE — Assessment & Plan Note (Addendum)
#  Colon cancer-of ascending colon- stage I; MSI mismatch/sporadic; Excellent prognosis more than 90% chance of cure [see discussion below regarding elevated CEA]; January 2021-CT abdomen pelvis negative for any recurrence. STABLE; Follow-up colonoscopy- awaiting on Feb 24th.  #Abnormal CEA-around June 2022- 5-7 ; overall -STABLE;  Low clinical suspicion of any recurrent malignancy. Awaiting labs from today-  Monitor for now.    #Obesity/weight loss: on Trulicity [PCP]- helps with weight loss.     # DISPOSITION:  # follow up TBD-Dr.B  Addendum: CEA Jan 2023- 5.3; slightly high; however overall trending down.  I spoke to patient regarding the tumor maker; would like to follow up. In 6 months- MD; 2-3 days prior labs- cbc/cmp;CEA-Dr.B
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
## 2021-12-24 NOTE — Progress Notes (Signed)
Patient denies new problems/concerns today.    Patient has intentional wt loss of 11 lbs since last documented weight.

## 2021-12-25 ENCOUNTER — Ambulatory Visit: Payer: Medicare HMO | Admitting: Internal Medicine

## 2021-12-25 ENCOUNTER — Other Ambulatory Visit: Payer: Medicare HMO

## 2021-12-25 LAB — CEA: CEA: 5.3 ng/mL — ABNORMAL HIGH (ref 0.0–4.7)

## 2022-01-01 ENCOUNTER — Telehealth: Payer: Self-pay

## 2022-01-01 NOTE — Progress Notes (Signed)
Chronic Care Management Pharmacy Assistant   Name: Cindy Griffin  MRN: [id number] DOB: 10-15-1946  Reason for Encounter:Diabetes Disease State Call.   Recent office visits:  No recent office visit  Recent consult visits:  12/24/2021 Dr. Rogue Bussing MD (Oncology) No Medication Changes noted 12/21/2021 Sharlet Salina DO (Physical Medicine) No Medication Changes noted 12/18/2021 Sharlet Salina DO (Physical Medicine) Unable to see note  Hospital visits:  None in previous 6 months  Medications: Outpatient Encounter Medications as of 01/01/2022  Medication Sig Note   acetaminophen (TYLENOL) 500 MG tablet Take 1,000 mg by mouth every 6 (six) hours as needed for moderate pain or headache.     albuterol (VENTOLIN HFA) 108 (90 Base) MCG/ACT inhaler INHALE 2 PUFFS INTO THE LUNGS EVERY 6 HOURS AS NEEDED FOR WHEEZING OR SHORTNESS OF BREATH.    Ascorbic Acid (VITAMIN C) 1000 MG tablet Take 1,000 mg by mouth daily.    aspirin EC 81 MG tablet Take 1 tablet (81 mg total) by mouth daily.    atorvastatin (LIPITOR) 80 MG tablet TAKE 1 TABLET EVERY DAY    BD PEN NEEDLE MICRO U/F 32G X 6 MM MISC USE ONCE DAILY    Biotin 5000 MCG CAPS Take by mouth.    Carboxymethylcellul-Glycerin (LUBRICATING EYE DROPS OP) Place 2 drops into both eyes 2 (two) times daily as needed (for dry eyes).    carvedilol (COREG) 3.125 MG tablet Take 3.125 mg by mouth 2 (two) times daily with a meal. 03/11/2021: Prescribed by Dr. Nehemiah Massed   cetirizine (ZYRTEC) 10 MG tablet Take 10 mg by mouth every other day as needed for allergies.     Cholecalciferol (VITAMIN D-3) 1000 units CAPS Take 1,000 Units by mouth daily.     Dulaglutide (TRULICITY) 3 KW/4.0XB SOPN Inject 3 mg as directed once a week. Patient receives through [employer] Patient Assistance through Dec 2023    famotidine (PEPCID) 20 MG tablet TAKE 1 TABLET TWICE DAILY    fluticasone (FLONASE) 50 MCG/ACT nasal spray USE 2 SPRAYS IN EACH NOSTRIL EVERY DAY    furosemide  (LASIX) 20 MG tablet TAKE 1 TABLET EVERY DAY    Glycopyrrolate-Formoterol (BEVESPI AEROSPHERE) 9-4.8 MCG/ACT AERO INHALE 2 PUFFS BY MOUTH AT BEDTIME. Patient receives through [employer] Patient Assistance    Lancets (ONETOUCH DELICA PLUS [id number]) MISC CHECK GLUCOSE THREE TIMES DAILY    lisinopril (ZESTRIL) 40 MG tablet TAKE 1 TABLET (40 MG TOTAL) BY MOUTH AT BEDTIME.    meclizine (ANTIVERT) 25 MG tablet Take 1 tablet (25 mg total) by mouth every 6 (six) hours.    metFORMIN (GLUCOPHAGE-XR) 750 MG 24 hr tablet TAKE 2 TABLETS (1,500 MG TOTAL) BY MOUTH DAILY WITH BREAKFAST.    Multiple Vitamins-Minerals (MULTIVITAMIN PO) Take 1 tablet by mouth daily.    Omega-3 1000 MG CAPS Take by mouth.    pregabalin (LYRICA) 75 MG capsule Take 1 capsule (75 mg total) by mouth 2 (two) times daily.    tiZANidine (ZANAFLEX) 2 MG tablet 1 po tid prn    traMADol (ULTRAM) 50 MG tablet Take 1 tablet by mouth 2 (two) times daily as needed.     traZODone (DESYREL) 50 MG tablet TAKE 1/2 TO 1 TABLET AT BEDTIME AS NEEDED FOR SLEEP    Turmeric 500 MG CAPS Take by mouth.    vitamin B-12 (CYANOCOBALAMIN) 500 MCG tablet Take 1,000 mcg by mouth daily.    No facility-administered encounter medications on file as of 01/01/2022.    Care Gaps: Tetanus  Vaccine COVID-19 Vaccine Ophthalmology Exam Colonoscopy Star Rating Drugs: Atorvastatin 80 mg last filled on 12/13/2021 for 90 day supply, No pharmacy noted Lisinopril 40 mg last filled on 12/13/2021 for 90 day supply, No pharmacy noted Metformin 750 mg last filled on 12/13/2021 for 90 day supply, No pharmacy noted Medication Fill Gaps: None ID  Recent Relevant Labs: Lab Results  Component Value Date/Time   HGBA1C 7.5 (A) 08/21/2021 10:14 AM   HGBA1C 6.6 (A) 04/15/2021 09:40 AM   HGBA1C 9.3 (A) 07/03/2019 09:19 AM   HGBA1C 6.9 03/01/2019 10:05 AM   MICROALBUR <0.2 12/10/2020 12:00 AM   MICROALBUR 0.6 11/12/2019 12:00 AM   MICROALBUR 50 07/22/2017 09:00 AM   MICROALBUR 50  07/15/2016 10:52 AM    Kidney Function Lab Results  Component Value Date/Time   CREATININE 0.86 12/10/2020 12:00 AM   CREATININE 0.85 09/09/2020 01:08 PM   CREATININE 0.98 06/23/2020 01:26 PM   CREATININE 0.76 11/12/2019 12:00 AM   GFRNONAA 67 12/10/2020 12:00 AM   GFRAA 77 12/10/2020 12:00 AM    Current antihyperglycemic regimen:  Trulicity 3 mg weekly  Metformin XR 750 mg 2 tablets daily  What recent interventions/DTPs have been made to improve glycemic control:  None ID  Have there been any recent hospitalizations or ED visits since last visit with CPP? No Patient reports hypoglycemic symptoms, including Hungry and some Nausea . Patient states she is still having some Nausea,but it does go away.  Patient reports hyperglycemic symptoms, including polyuria Patient states she has polyuria because she is taking fluid pills. How often are you checking your blood sugar? once daily What are your blood sugars ranging?  Fasting:  118,110,116,119,125  Before meals: None After meals: None  Bedtime:  [phone] (patient states she had eaten bread pudding before checking her sugar)  During the week, how often does your blood glucose drop below 70? Never  Are you checking your feet daily/regularly?   Yes, patient states she checks her feet daily.  Patient states she has lost 11 pounds since taking Trulicity. Patient reports she does not really have a appetite like before.Patient states she eats the most around 7 pm-8 pm, and has little snack through out the day.  Patient states the last time she spoke with the clinical pharmacist was in [place] he informed her he was going to look in to a medication for her.Patient is unsure of the name of the medication as she does not remember the name.I informed the patient I would reach out to the clinical pharmacist to advise since I do not see this in his note.Patient is aware the clinical pharmacist is on Vacation , and will not be back  until Tuesday 01/05/2022, and I will inform her of his recommendation then.  Patient Verbalized understanding.  Adherence Review: Is the patient currently on a STATIN medication? Yes Is the patient currently on ACE/ARB medication? Yes Does the patient have >5 day gap between last estimated fill dates? No   Telephone follow up appointment with care management team member scheduled for:  03/03/22 at [place] Pharmacist Assistant [phone]

## 2022-01-04 ENCOUNTER — Other Ambulatory Visit: Payer: Self-pay | Admitting: Family Medicine
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
## 2022-01-05 ENCOUNTER — Encounter: Payer: Self-pay | Admitting: Family Medicine

## 2022-01-05 ENCOUNTER — Other Ambulatory Visit: Payer: Self-pay

## 2022-01-05 ENCOUNTER — Ambulatory Visit (INDEPENDENT_AMBULATORY_CARE_PROVIDER_SITE_OTHER): Payer: Medicare HMO | Admitting: Family Medicine

## 2022-01-05 VITALS — BP 134/86 | HR 89 | Resp 16 | Ht 64.0 in | Wt 231.0 lb

## 2022-01-05 DIAGNOSIS — E559 Vitamin D deficiency, unspecified: Secondary | ICD-10-CM

## 2022-01-05 DIAGNOSIS — E785 Hyperlipidemia, unspecified: Secondary | ICD-10-CM | POA: Diagnosis not present

## 2022-01-05 DIAGNOSIS — F325 Major depressive disorder, single episode, in full remission: Secondary | ICD-10-CM | POA: Diagnosis not present

## 2022-01-05 DIAGNOSIS — I152 Hypertension secondary to endocrine disorders: Secondary | ICD-10-CM | POA: Diagnosis not present

## 2022-01-05 DIAGNOSIS — D692 Other nonthrombocytopenic purpura: Secondary | ICD-10-CM | POA: Diagnosis not present

## 2022-01-05 DIAGNOSIS — J411 Mucopurulent chronic bronchitis: Secondary | ICD-10-CM | POA: Diagnosis not present

## 2022-01-05 DIAGNOSIS — G8929 Other chronic pain: Secondary | ICD-10-CM

## 2022-01-05 DIAGNOSIS — E538 Deficiency of other specified B group vitamins: Secondary | ICD-10-CM | POA: Diagnosis not present

## 2022-01-05 DIAGNOSIS — E1159 Type 2 diabetes mellitus with other circulatory complications: Secondary | ICD-10-CM | POA: Diagnosis not present

## 2022-01-05 DIAGNOSIS — I10 Essential (primary) hypertension: Secondary | ICD-10-CM | POA: Diagnosis not present

## 2022-01-05 DIAGNOSIS — R600 Localized edema: Secondary | ICD-10-CM

## 2022-01-05 DIAGNOSIS — M25512 Pain in left shoulder: Secondary | ICD-10-CM

## 2022-01-05 DIAGNOSIS — E1169 Type 2 diabetes mellitus with other specified complication: Secondary | ICD-10-CM

## 2022-01-05 DIAGNOSIS — Z23 Encounter for immunization: Secondary | ICD-10-CM

## 2022-01-05 LAB — POCT GLYCOSYLATED HEMOGLOBIN (HGB A1C): Hemoglobin A1C: 6.8 % — AB (ref 4.0–5.6)

## 2022-01-05 MED ORDER — TETANUS-DIPHTH-ACELL PERTUSSIS 5-2-15.5 LF-MCG/0.5 IM SUSP: 0.5000 mL | Freq: Once | INTRAMUSCULAR | 0 refills | Status: AC
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
## 2022-01-06 LAB — CBC WITH DIFFERENTIAL/PLATELET
Absolute Monocytes: 714 {cells}/uL (ref 200–950)
Basophils Absolute: 53 {cells}/uL (ref 0–200)
Basophils Relative: 0.5 %
Eosinophils Absolute: 137 {cells}/uL (ref 15–500)
Eosinophils Relative: 1.3 %
HCT: 39.2 % (ref 35.0–45.0)
Hemoglobin: 12.9 g/dL (ref 11.7–15.5)
Lymphs Abs: 2027 {cells}/uL (ref 850–3900)
MCH: 28.2 pg (ref 27.0–33.0)
MCHC: 32.9 g/dL (ref 32.0–36.0)
MCV: 85.8 fL (ref 80.0–100.0)
MPV: 10.7 fL (ref 7.5–12.5)
Monocytes Relative: 6.8 %
Neutro Abs: 7571 {cells}/uL (ref 1500–7800)
Neutrophils Relative %: 72.1 %
Platelets: 330 Thousand/uL (ref 140–400)
RBC: 4.57 Million/uL (ref 3.80–5.10)
RDW: 13.9 % (ref 11.0–15.0)
Total Lymphocyte: 19.3 %
WBC: 10.5 Thousand/uL (ref 3.8–10.8)

## 2022-01-06 LAB — LIPID PANEL
Cholesterol: 129 mg/dL (ref <200)
HDL: 65 mg/dL (ref >=50)
LDL Cholesterol (Calc): 48 mg/dL (calc)
Non-HDL Cholesterol (Calc): 64 mg/dL (calc) (ref <130)
Total CHOL/HDL Ratio: 2 (calc) (ref <5.0)
Triglycerides: 79 mg/dL (ref <150)

## 2022-01-06 LAB — COMPLETE METABOLIC PANEL WITH GFR
AG Ratio: 1.6 (calc) (ref 1.0–2.5)
ALT: 17 U/L (ref 6–29)
AST: 16 U/L (ref 10–35)
Albumin: 4.5 g/dL (ref 3.6–5.1)
Alkaline phosphatase (APISO): 75 U/L (ref 37–153)
BUN: 19 mg/dL (ref 7–25)
CO2: 29 mmol/L (ref 20–32)
Calcium: 10.1 mg/dL (ref 8.6–10.4)
Chloride: 103 mmol/L (ref 98–110)
Creat: 0.8 mg/dL (ref 0.60–1.00)
Globulin: 2.8 g/dL (calc) (ref 1.9–3.7)
Glucose, Bld: 107 mg/dL — ABNORMAL HIGH (ref 65–99)
Potassium: 4.8 mmol/L (ref 3.5–5.3)
Sodium: 141 mmol/L (ref 135–146)
Total Bilirubin: 0.5 mg/dL (ref 0.2–1.2)
Total Protein: 7.3 g/dL (ref 6.1–8.1)
eGFR: 77 mL/min/{1.73_m2} (ref >=60)

## 2022-01-06 LAB — VITAMIN D 25 HYDROXY (VIT D DEFICIENCY, FRACTURES): Vit D, 25-Hydroxy: 40 ng/mL (ref 30–100)

## 2022-01-06 LAB — MICROALBUMIN / CREATININE URINE RATIO
Creatinine, Urine: 99 mg/dL (ref 20–275)
Microalb Creat Ratio: 16 ug/mg{creat}
Microalb, Ur: 1.6 mg/dL

## 2022-01-06 LAB — VITAMIN B12: Vitamin B-12: 1370 pg/mL — ABNORMAL HIGH (ref 200–1100)

## 2022-01-14 ENCOUNTER — Telehealth: Payer: Self-pay | Admitting: Internal Medicine

## 2022-01-14 DIAGNOSIS — C182 Malignant neoplasm of ascending colon: Secondary | ICD-10-CM

## 2022-01-14 NOTE — Telephone Encounter (Signed)
follow up. In 6 months- MD; 2-3 days prior labs- cbc/cmp;CEA-Dr.B

## 2022-01-15 NOTE — Telephone Encounter (Signed)
Labs entered.  Colette, please schedule as Md recommends and inform pt of appt details.

## 2022-01-15 NOTE — Addendum Note (Signed)
Addended by: Vanice Sarah on: 01/15/2022 08:28 AM   Modules accepted: Orders

## 2022-01-22 ENCOUNTER — Ambulatory Visit: Payer: Medicare HMO | Admitting: Anesthesiology

## 2022-01-22 ENCOUNTER — Ambulatory Visit
Admission: RE | Admit: 2022-01-22 | Discharge: 2022-01-22 | Disposition: A | Discharge status: Home / Self Care | Payer: Medicare HMO | Attending: Gastroenterology | Admitting: Gastroenterology

## 2022-01-22 ENCOUNTER — Encounter
Admission: RE | Disposition: A | Discharge status: Home / Self Care | Payer: Self-pay | Source: Home / Self Care | Attending: Gastroenterology

## 2022-01-22 DIAGNOSIS — Z85038 Personal history of other malignant neoplasm of large intestine: Secondary | ICD-10-CM | POA: Diagnosis not present

## 2022-01-22 DIAGNOSIS — Z8542 Personal history of malignant neoplasm of other parts of uterus: Secondary | ICD-10-CM | POA: Diagnosis not present

## 2022-01-22 DIAGNOSIS — Z7984 Long term (current) use of oral hypoglycemic drugs: Secondary | ICD-10-CM | POA: Insufficient documentation

## 2022-01-22 DIAGNOSIS — J449 Chronic obstructive pulmonary disease, unspecified: Secondary | ICD-10-CM | POA: Diagnosis not present

## 2022-01-22 DIAGNOSIS — Z79899 Other long term (current) drug therapy: Secondary | ICD-10-CM | POA: Diagnosis not present

## 2022-01-22 DIAGNOSIS — K219 Gastro-esophageal reflux disease without esophagitis: Secondary | ICD-10-CM | POA: Diagnosis not present

## 2022-01-22 DIAGNOSIS — I10 Essential (primary) hypertension: Secondary | ICD-10-CM | POA: Insufficient documentation

## 2022-01-22 DIAGNOSIS — D123 Benign neoplasm of transverse colon: Secondary | ICD-10-CM | POA: Diagnosis not present

## 2022-01-22 DIAGNOSIS — Z1211 Encounter for screening for malignant neoplasm of colon: Secondary | ICD-10-CM | POA: Diagnosis not present

## 2022-01-22 DIAGNOSIS — Z860100 Personal history of colon polyps, unspecified: Secondary | ICD-10-CM

## 2022-01-22 DIAGNOSIS — E119 Type 2 diabetes mellitus without complications: Secondary | ICD-10-CM | POA: Insufficient documentation

## 2022-01-22 DIAGNOSIS — D125 Benign neoplasm of sigmoid colon: Secondary | ICD-10-CM | POA: Diagnosis not present

## 2022-01-22 DIAGNOSIS — K635 Polyp of colon: Secondary | ICD-10-CM | POA: Diagnosis not present

## 2022-01-22 DIAGNOSIS — Z980 Intestinal bypass and anastomosis status: Secondary | ICD-10-CM | POA: Diagnosis not present

## 2022-01-22 DIAGNOSIS — D124 Benign neoplasm of descending colon: Secondary | ICD-10-CM | POA: Diagnosis not present

## 2022-01-22 HISTORY — PX: COLONOSCOPY WITH PROPOFOL: SHX5780

## 2022-01-22 LAB — GLUCOSE, CAPILLARY: Glucose-Capillary: 99 mg/dL (ref 70–99)

## 2022-01-22 SURGERY — COLONOSCOPY WITH PROPOFOL
Anesthesia: General

## 2022-01-22 MED ORDER — PROPOFOL 10 MG/ML IV BOLUS
INTRAVENOUS | Status: DC | PRN
Start: 2022-01-22 — End: 2022-01-22
  Administered 2022-01-22: 20 mg via INTRAVENOUS
  Administered 2022-01-22: 60 mg via INTRAVENOUS
  Administered 2022-01-22: 20 mg via INTRAVENOUS

## 2022-01-22 MED ORDER — LIDOCAINE HCL (CARDIAC) PF 100 MG/5ML IV SOSY
PREFILLED_SYRINGE | INTRAVENOUS | Status: DC | PRN
  Administered 2022-01-22: 50 mg via INTRAVENOUS

## 2022-01-22 MED ORDER — PROPOFOL 500 MG/50ML IV EMUL
INTRAVENOUS | Status: DC | PRN
Start: 2022-01-22 — End: 2022-01-22
  Administered 2022-01-22: 140 ug/kg/min via INTRAVENOUS

## 2022-01-22 MED ORDER — SODIUM CHLORIDE 0.9 % IV SOLN
INTRAVENOUS | Status: DC
  Administered 2022-01-22: 20 mL/h via INTRAVENOUS

## 2022-01-22 NOTE — Transfer of Care (Signed)
Immediate Anesthesia Transfer of Care Note  Patient: Cindy Griffin  Procedure(s) Performed: COLONOSCOPY WITH PROPOFOL  Patient Location: Endoscopy Unit  Anesthesia Type:General  Level of Consciousness: awake, alert  and oriented  Airway & Oxygen Therapy: Patient Spontanous Breathing  Post-op Assessment: Report given to RN and Post -op Vital signs reviewed and stable  Post vital signs: Reviewed and stable  Last Vitals:  Vitals Value Taken Time  BP 123/73 01/22/22 1101  Temp 36.6 C 01/22/22 1058  Pulse 86 01/22/22 1101  Resp 18 01/22/22 1101  SpO2 100 % 01/22/22 1101  Vitals shown include unvalidated device data.  Last Pain:  Vitals:   01/22/22 1058  TempSrc: Temporal  PainSc: 0-No pain         Complications: No notable events documented.

## 2022-01-22 NOTE — Op Note (Signed)
[facility] Gastroenterology Patient Name: Cindy Griffin Procedure Date: 01/22/2022 10:17 AM MRN: [id number] Account #: [id number] Date of Birth: [birth year] Admit Type: Outpatient Age: 76 Room: [facility] ENDO ROOM 4 Gender: Female Note Status: Finalized Instrument Name: Jasper Riling [id number] Procedure:             Colonoscopy Indications:           High risk colon cancer surveillance: Personal history                         of colon cancer Providers:             [name] MD, MD Medicines:             Monitored Anesthesia Care Complications:         No immediate complications. Procedure:             Pre-Anesthesia Assessment:                        - Prior to the procedure, a History and Physical was                         performed, and patient medications, allergies and                         sensitivities were reviewed. The patient's tolerance                         of previous anesthesia was reviewed.                        - The risks and benefits of the procedure and the                         sedation options and risks were discussed with the                         patient. All questions were answered and informed                         consent was obtained.                        - ASA Grade Assessment: III - A patient with severe                         systemic disease.                        After obtaining informed consent, the colonoscope was                         passed under direct vision. Throughout the procedure,                         the patient's blood pressure, pulse, and oxygen                         saturations were monitored continuously. The  Colonoscope was introduced through the anus and                         advanced to the the ileocolonic anastomosis. The                         colonoscopy was performed with ease. The patient                         tolerated the procedure well. The quality of the bowel                          preparation was good. Findings:      The perianal and digital rectal examinations were normal.      There was evidence of a prior end-to-end ileo-colonic anastomosis in the       ascending colon. This was characterized by congestion, edema, erythema       and inflammation. Biopsies were taken with a cold forceps for histology.      Six sessile polyps were found in the transverse colon. The polyps were 5       to 8 mm in size. These polyps were removed with a cold snare. Resection       and retrieval were complete.      A 7 mm polyp was found in the sigmoid colon. The polyp was sessile. The       polyp was removed with a cold snare. Resection and retrieval were       complete.      A 10 mm polyp was found in the descending colon. The polyp was       semi-pedunculated. The polyp was removed with a hot snare. Resection and       retrieval were complete.      The exam was otherwise without abnormality on direct and retroflexion       views. Impression:            - End-to-end ileo-colonic anastomosis, characterized                         by congestion, edema, erythema and inflammation.                         Biopsied.                        - Six 5 to 8 mm polyps in the transverse colon,                         removed with a cold snare. Resected and retrieved.                        - One 7 mm polyp in the sigmoid colon, removed with a                         cold snare. Resected and retrieved.                        - One 10 mm polyp in the descending colon, removed  with a hot snare. Resected and retrieved.                        - The examination was otherwise normal on direct and                         retroflexion views. Recommendation:        - Discharge patient to home (with escort).                        - Resume previous diet.                        - Continue present medications.                        - Await pathology results.                         - Repeat colonoscopy in 1 year for surveillance. Procedure Code(s):     --- Professional ---                        [phone], Colonoscopy, flexible; with removal of                         tumor(s), polyp(s), or other lesion(s) by snare                         technique                        [id number], 94, Colonoscopy, flexible; with biopsy, single                         or multiple Diagnosis Code(s):     --- Professional ---                        K63.5, Polyp of colon                        Z85.038, Personal history of other malignant neoplasm                         of large intestine                        Z98.0, Intestinal bypass and anastomosis status CPT copyright 2019 American Medical Association. All rights reserved. The codes documented in this report are preliminary and upon coder review may  be revised to meet current compliance requirements. [name], MD [name] MD, MD 01/22/2022 10:59:06 AM This report has been signed electronically. Number of Addenda: 0 Note Initiated On: 01/22/2022 10:17 AM Scope Withdrawal Time: 0 hours 16 minutes 11 seconds  Total Procedure Duration: 0 hours 23 minutes 44 seconds  Estimated Blood Loss:  Estimated blood loss: none.      [facility]

## 2022-01-22 NOTE — Brief Op Note (Signed)
Pt had hemicolectomy. Extent reached at anastomosis during colonoscopy
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
## 2022-01-22 NOTE — Anesthesia Procedure Notes (Signed)
Date/Time: 01/22/2022 10:27 AM Performed by: Lily Peer, Makensie Mulhall, CRNA Pre-anesthesia Checklist: Patient identified, Emergency Drugs available, Suction available, Patient being monitored and Timeout performed Patient Re-evaluated:Patient Re-evaluated prior to induction Oxygen Delivery Method: Nasal cannula Induction Type: IV induction

## 2022-01-22 NOTE — Anesthesia Postprocedure Evaluation (Signed)
Anesthesia Post Note  Patient: Cindy Griffin  Procedure(s) Performed: COLONOSCOPY WITH PROPOFOL  Patient location during evaluation: Endoscopy Anesthesia Type: General Level of consciousness: awake and alert Pain management: pain level controlled Vital Signs Assessment: post-procedure vital signs reviewed and stable Respiratory status: spontaneous breathing, nonlabored ventilation, respiratory function stable and patient connected to nasal cannula oxygen Cardiovascular status: blood pressure returned to baseline and stable Postop Assessment: no apparent nausea or vomiting Anesthetic complications: no   No notable events documented.   Last Vitals:  Vitals:   01/22/22 1058 01/22/22 1118  BP: 123/73 101/82  Pulse:    Resp: (!) 22   Temp: 36.6 C   SpO2:      Last Pain:  Vitals:   01/22/22 1118  TempSrc:   PainSc: 0-No pain                 Precious Haws Courtnei Ruddell
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
## 2022-01-25 ENCOUNTER — Encounter: Payer: Self-pay | Admitting: Gastroenterology

## 2022-01-26 LAB — SURGICAL PATHOLOGY

## 2022-02-01 ENCOUNTER — Encounter: Payer: Self-pay | Admitting: Gastroenterology

## 2022-02-09 ENCOUNTER — Telehealth: Payer: Self-pay

## 2022-02-09 NOTE — Telephone Encounter (Signed)
Returned pt's call regarding her colonoscopy results. Left vm requesting a return call and that her results were mailed.  ?

## 2022-02-10 DIAGNOSIS — M5412 Radiculopathy, cervical region: Secondary | ICD-10-CM | POA: Diagnosis not present

## 2022-02-10 DIAGNOSIS — M4802 Spinal stenosis, cervical region: Secondary | ICD-10-CM | POA: Diagnosis not present

## 2022-02-10 NOTE — Telephone Encounter (Signed)
Pt returned my call and colonoscopy results were given. Pt made aware a letter was also mailed out for her records.  ?

## 2022-02-24 ENCOUNTER — Other Ambulatory Visit: Payer: Self-pay | Admitting: Family Medicine

## 2022-03-02 ENCOUNTER — Telehealth: Payer: Self-pay

## 2022-03-02 NOTE — Progress Notes (Signed)
Chronic Care Management APPOINTMENT REMINDER ? ? ?Called Cindy Griffin, No answer, left message of appointment on 03/03/2022 at 9:15 am via telephone visit with Junius Argyle , Pharm D. Notified to have all medications, supplements, blood pressure and/or blood sugar logs available during appointment and to return call if need to reschedule. ? ? ?[name],CPA ?Clinical Pharmacist Assistant ?[phone]  ? ?

## 2022-03-03 ENCOUNTER — Ambulatory Visit (INDEPENDENT_AMBULATORY_CARE_PROVIDER_SITE_OTHER): Payer: Medicare HMO

## 2022-03-03 DIAGNOSIS — E1121 Type 2 diabetes mellitus with diabetic nephropathy: Secondary | ICD-10-CM

## 2022-03-03 DIAGNOSIS — E1159 Type 2 diabetes mellitus with other circulatory complications: Secondary | ICD-10-CM

## 2022-03-03 NOTE — Patient Instructions (Signed)
Visit Information ?It was great speaking with you today!  Please let me know if you have any questions about our visit. ? ? Goals Addressed   ? ?  ?  ?  ?  ? This Visit's Progress  ?  Monitor and Manage My Blood Sugar-Diabetes Type 2   On track  ?  Timeframe:  Long-Range Goal ?Priority:  High ?Start Date:  02/05/2021                           ?Expected End Date:  08/08/2022                    ? ?Follow Up within 60 days ?  ?-Check blood sugar daily before breakfast ?- check blood sugar if I feel it is too high or too low ?- enter blood sugar readings and medication or insulin into daily log ?- take the blood sugar log to all doctor visits  ?  ?Why is this important?   ?Checking your blood sugar at home helps to keep it from getting very high or very low.  ?Writing the results in a diary or log helps the doctor know how to care for you.  ?Your blood sugar log should have the time, date and the results.  ?Also, write down the amount of insulin or other medicine that you take.  ?Other information, like what you ate, exercise done and how you were feeling, will also be helpful.   ?  ?Notes:  ?  ? ?  ? ? ?Patient Care Plan: General Pharmacy (Adult)  ?  ? ?Problem Identified: Hypertension, Hyperlipidemia, Diabetes, COPD, Depression, Anxiety and Chronic Pain, Insomnia   ?Priority: High  ?  ? ?Long-Range Goal: Patient-Specific Goal   ?Start Date: 02/05/2021  ?Expected End Date: 07/14/2022  ?This Visit's Progress: On track  ?Recent Progress: On track  ?Priority: High  ?Note:   ?Current Barriers:  ?Unable to independently afford treatment regimen ?Unable to achieve control of Diabetes  ? ?Pharmacist Clinical Goal(s):  ?Over the next 90 days, patient will achieve control of diabetes as evidenced by A1c less than 8% through collaboration with PharmD and provider.  ? ?Interventions: ?1:1 collaboration with Steele Sizer, MD regarding development and update of comprehensive plan of care as evidenced by provider attestation and  co-signature ?Inter-disciplinary care team collaboration (see longitudinal plan of care) ?Comprehensive medication review performed; medication list updated in electronic medical record ? ?Diabetes (A1c goal <7%) ?-Controlled ?-Current medications: ?Trulicity 3 mg weekly: Appropriate, Effective, Safe, Accessible ?Metformin XR 750 mg 2 tablets daily: Appropriate, Effective, Safe, Accessible  ?-Medications previously tried: Actos (hypoglycemia), glipizide, Invokana, Soliqua,   ?-She does report nausea since starting Trulicity, counseled on managing symptoms.  ?-Current home glucose readings:  ?Fasting: 127, 136, 115, 117  ? Afternoon: 129, 159, 169  ?-Reports hypoglycemic symptoms: some light-headedness.  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 ?-Continue current medications ? ?Hypertension (BP goal <140/90) ?-Controlled ?-Current treatment: ?Carvedilol 6.25 mg twice daily: Appropriate, Effective, Safe, Accessible  ?Furosemide 20 mg daily: Appropriate, Effective, Safe, Accessible  ?Lisinopril 40 mg daily: Appropriate, Effective, Safe, Accessible  ?-Medications previously tried: NA  ?-Current home readings: NA ?-Denies hypotensive/hypertensive symptoms ?-Recommended to continue current medication ? ?Hyperlipidemia: (LDL goal < 70) ?-Controlled ?-Current treatment: ?Atorvastatin 80 mg daily  ?-  Current antiplatelet treatment: ?Aspirin 81 mg daily  ?-Medications previously tried: NA  ?-Educated on Importance of limiting foods high in cholesterol; ?-Recommended to continue current medication ? ?COPD (Goal: control symptoms and prevent exacerbations) ?-Controlled ?-Current treatment  ?Ventolin HFA 2 puffs every 6 hours as needed  ?Bevespi 2 puffs at bedtime  ?-Medications previously tried: NA  ?-Gold Grade: Gold 1 (FEV1>80%) ?-Current COPD  Classification:  A (low sx, <2 exacerbations/yr) ?-MMRC/CAT score: NA ?-Pulmonary function testing: FEV1 92% (Jun 2021) ?-Exacerbations requiring treatment in last 6 months: None ?-Patient reports consistent use of maintenance inhaler ?-Frequency of rescue inhaler use: Infrequently ?-Counseled on Benefits of consistent maintenance inhaler use ?-Recommended to continue current medication ? ?Insomnia (Goal: Improve sleep quality) ?-Controlled ?-Current treatment: ?Trazodone 50 mg 1/2 - 1 tablet nightly as needed - Sleep ?-Medications previously tried/failed: NA ?-PHQ9: 4 ?-GAD7: NA ?-Educated on Benefits of medication for symptom control ?-Recommended to continue current medication ? ?Patient Goals/Self-Care Activities ?Over the next 90 days, patient will:  ?- check glucose daily before breakfast, document, and provide at future appointments ? ?Follow Up Plan: Telephone follow up appointment with care management team member scheduled for:  06/12/22 at 9:15 AM ?  ? ? ? ?Patient agreed to services and verbal consent obtained.  ? ?Patient verbalizes understanding of instructions and care plan provided today and agrees to view in [place]. Active MyChart status confirmed with patient.   ? ?Doristine Section, BCACP, CPP ?Clinical Pharmacist Practitioner  ?[facility] ?[phone]  ?

## 2022-03-03 NOTE — Progress Notes (Signed)
?Chronic Care Management ?Pharmacy Note ? ?03/03/2022 ?Name:  Cindy Griffin MRN:  [id number] DOB:  Oct 14, 1946 ? ?Summary: ?Patient presents for CCM follow-up. Her blood sugars are well controlled, but continues to note nausea since starting Trulicity ? ?Recommendations/Changes made from today's visit: ?-Continue current medications ? ?Plan: ?CPP follow-up 4 months ? ?Recommended Problem List Changes:  ? ?Modify: Type 2 diabetes mellitus with diabetic polyneuropathy, without long-term current use of insulin ([place]) ? ?Subjective: ?Cindy Griffin is an 76 y.o. year old female who is a primary patient of Steele Sizer, MD.  The CCM team was consulted for assistance with disease management and care coordination needs.   ? ?Engaged with patient by telephone for follow up visit in response to provider referral for pharmacy case management and/or care coordination services.  ? ?Consent to Services:  ?The patient was given information about Chronic Care Management services, agreed to services, and gave verbal consent prior to initiation of services.  Please see initial visit note for detailed documentation.  ? ?Patient Care Team: ?Steele Sizer, MD as PCP - General (Family Medicine) ?Sharlet Salina, MD as Consulting Physician (Physical Medicine and Rehabilitation) ?Merlene Morse, MD as Consulting Physician (Orthopedic Surgery) ?Ileana Roup, MD as Consulting Physician (General Surgery) ?Germaine Pomfret, [facility] as Pharmacist (Pharmacist) ?Corey Skains, MD as Consulting Physician (Cardiology) ?Erby Pian, MD as Referring Physician (Pulmonary Disease) ?Cammie Sickle, MD as Consulting Physician (Hematology and Oncology) ? ?Recent office visits: ?01/05/22: Patient presented to Dr. Ancil Boozer for follow-up. A1c 6.8%.  ?08/21/21: Patient presented to Dr. Ancil Boozer for follow-up. A1c 7.5%. BP 128/74.  ?07/16/21: Patient presented to Clemetine Marker, LPN for AWV.  ?Recent consult visits: ?08/04/21: Patient  presented to Dr. Nehemiah Massed (Cardiology) for follow-up. Carvedilol increased to 6.25 mg twice daily.  ? ?Hospital visits: ?None in previous 6 months ? ?Objective: ? ?Lab Results  ?Component Value Date  ? CREATININE 0.80 01/05/2022  ? BUN 19 01/05/2022  ? GFRNONAA 67 12/10/2020  ? GFRAA 77 12/10/2020  ? NA 141 01/05/2022  ? K 4.8 01/05/2022  ? CALCIUM 10.1 01/05/2022  ? CO2 29 01/05/2022  ? ? ?Lab Results  ?Component Value Date/Time  ? HGBA1C 6.8 (A) 01/05/2022 01:19 PM  ? HGBA1C 7.5 (A) 08/21/2021 10:14 AM  ? HGBA1C 9.3 (A) 07/03/2019 09:19 AM  ? HGBA1C 6.9 03/01/2019 10:05 AM  ? FRUCTOSAMINE 264 12/27/2017 09:56 AM  ? MICROALBUR 1.6 01/05/2022 01:59 PM  ? MICROALBUR <0.2 12/10/2020 12:00 AM  ? MICROALBUR 50 07/22/2017 09:00 AM  ? MICROALBUR 50 07/15/2016 10:52 AM  ?  ?Last diabetic Eye exam:  ?Lab Results  ?Component Value Date/Time  ? HMDIABEYEEXA No Retinopathy 10/08/2020 12:00 AM  ?  ?Last diabetic Foot exam: No results found for: HMDIABFOOTEX  ? ?Lab Results  ?Component Value Date  ? CHOL 129 01/05/2022  ? HDL 65 01/05/2022  ? [place] 48 01/05/2022  ? TRIG 79 01/05/2022  ? CHOLHDL 2.0 01/05/2022  ? ? ? ?  Latest Ref Rng & Units 01/05/2022  ?  1:59 PM 12/10/2020  ? 12:00 AM 06/23/2020  ?  1:26 PM  ?Hepatic Function  ?Total Protein 6.1 - 8.1 g/dL 7.3   7.4   7.4    ?Albumin 3.5 - 5.0 g/dL   4.0    ?AST 10 - 35 U/L _0 ?ALT 6 - 29 U/L _1 ?Alk Phosphatase 38 -  126 U/L   76    ?Total Bilirubin 0.2 - 1.2 mg/dL 0.5   0.3   0.5    ? ? ?No results found for: TSH, FREET4 ? ? ?  Latest Ref Rng & Units 01/05/2022  ?  1:59 PM 12/10/2020  ? 12:00 AM 09/09/2020  ?  1:08 PM  ?CBC  ?WBC 3.8 - 10.8 Thousand/uL 10.5   6.7   6.7    ?Hemoglobin 11.7 - 15.5 g/dL 12.9   13.2   12.3    ?Hematocrit 35.0 - 45.0 % 39.2   39.2   38.8    ?Platelets 140 - 400 Thousand/uL 330   344   292    ? ? ?Lab Results  ?Component Value Date/Time  ? VD25OH 40 01/05/2022 01:59 PM  ? VD25OH 30 12/10/2020 12:00 AM  ? ? ?Clinical ASCVD: No   ?The ASCVD Risk score (Arnett DK, et al., 2019) failed to calculate for the following reasons: ?  The valid total cholesterol range is 130 to 320 mg/dL   ? ? ?  01/05/2022  ?  1:16 PM 08/21/2021  ? 10:32 AM 07/16/2021  ?  3:47 PM  ?Depression screen PHQ 2/9  ?Decreased Interest 0 0 0  ?Down, Depressed, Hopeless 0 0 1  ?PHQ - 2 Score 0 0 1  ?Altered sleeping _0 ?Tired, decreased energy _1 ?Change in appetite 0 1 0  ?Feeling bad or failure about yourself  0 0 0  ?Trouble concentrating 0 0 1  ?Moving slowly or fidgety/restless 0 0 0  ?Suicidal thoughts 0 0 0  ?PHQ-9 Score _2 ?Difficult doing work/chores  Not difficult at all Not difficult at all  ?  ?Social History  ? ?Tobacco Use  ?Smoking Status Never  ?Smokeless Tobacco Never  ?Tobacco Comments  ? smoking cessation materials not required  ? ?BP Readings from Last 3 Encounters:  ?01/22/22 101/82  ?01/05/22 134/86  ?12/24/21 (!) 147/80  ? ?Pulse Readings from Last 3 Encounters:  ?01/22/22 76  ?01/05/22 89  ?12/24/21 70  ? ?Wt Readings from Last 3 Encounters:  ?01/22/22 230 lb (104.3 kg)  ?01/05/22 231 lb (104.8 kg)  ?12/24/21 237 lb 6.4 oz (107.7 kg)  ? ? ?Assessment/Interventions: Review of patient past medical history, allergies, medications, health status, including review of consultants reports, laboratory and other test data, was performed as part of comprehensive evaluation and provision of chronic care management services.  ? ?SDOH:  (Social Determinants of Health) assessments and interventions performed: Yes ? ? ? ? ? ? ? ?[place] ? ?Allergies  ?Allergen Reactions  ? Doxycycline Rash  ?  Itching  ? ? ?Medications Reviewed Today   ? ? Reviewed by Frederich Balding, RN (Registered Nurse) on 01/22/22 at 43  Med List Status: <None>  ? ?Medication Order Taking? Sig Documenting Provider Last Dose Status Informant  ?acetaminophen (TYLENOL) 500 MG tablet [id number] Yes Take 1,000 mg by mouth every 6 (six) hours as needed for moderate pain or headache.   [provider] 01/21/2022 Active Self  ?albuterol (VENTOLIN HFA) 108 (90 Base) MCG/ACT inhaler [id number] Yes INHALE 2 PUFFS INTO THE LUNGS EVERY 6 HOURS AS NEEDED FOR WHEEZING OR SHORTNESS OF BREATH. Steele Sizer, MD 01/21/2022 Active   ?Ascorbic Acid (VITAMIN C) 1000 MG tablet [id number] Yes Take 1,000 mg by mouth daily. [provider] Past Week Active Self  ?aspirin EC 81 MG tablet [id number] Yes Take 1  tablet (81 mg total) by mouth daily. Steele Sizer, MD 01/21/2022 Active Self  ?atorvastatin (LIPITOR) 80 MG tablet [id number]  TAKE 1 TABLET EVERY DAY Steele Sizer, MD  Active   ?BD PEN NEEDLE MICRO U/F 32G X 6 MM MISC [id number] Yes USE ONCE DAILY Steele Sizer, MD 01/21/2022 Active   ?Biotin 5000 MCG CAPS [id number]  Take by mouth. [provider]  Active Self  ?Carboxymethylcellul-Glycerin (LUBRICATING EYE DROPS OP) [id number] Yes Place 2 drops into both eyes 2 (two) times daily as needed (for dry eyes). [provider] 01/21/2022 Active Self  ?carvedilol (COREG) 3.125 MG tablet [id number]  Take 3.125 mg by mouth 2 (two) times daily with a meal. [provider]  Expired 12/24/21 2359   ?         ?Med Note Michaelle Birks, Joann Jorge A   Wed Mar 11, 2021  1:26 PM) Prescribed by Dr. Nehemiah Massed  ?cetirizine (ZYRTEC) 10 MG tablet [id number] Yes Take 10 mg by mouth every other day as needed for allergies.  [provider] Past Week Active Self  ?         ?Med Note Larwance Sachs A   Thu May 06, 2016 12:00 PM)    ?Cholecalciferol (VITAMIN D-3) 1000 units CAPS [id number] Yes Take 1,000 Units by mouth daily.  [provider] Past Week Active Self  ?Dulaglutide (TRULICITY) 3 AY/8.4FU SOPN [id number] Yes Inject 3 mg as directed once a week. Patient receives through [employer] Patient Assistance through Dec 2023 Steele Sizer, MD Past Week Active   ?famotidine (PEPCID) 20 MG tablet [id number] Yes TAKE 1 TABLET TWICE DAILY Steele Sizer, MD 01/21/2022 Active    ?fluticasone (FLONASE) 50 MCG/ACT nasal spray [id number] Yes USE 2 SPRAYS IN [facility] NOSTRIL EVERY Eloise Harman, MD 01/21/2022 Active   ?furosemide (LASIX) 20 MG tablet [id number] Yes TAKE 1 TABLET EVERY DAY Sowles, Kri

## 2022-03-10 DIAGNOSIS — M19012 Primary osteoarthritis, left shoulder: Secondary | ICD-10-CM | POA: Diagnosis not present

## 2022-03-10 DIAGNOSIS — M25542 Pain in joints of left hand: Secondary | ICD-10-CM | POA: Diagnosis not present

## 2022-03-10 DIAGNOSIS — M19049 Primary osteoarthritis, unspecified hand: Secondary | ICD-10-CM | POA: Diagnosis not present

## 2022-03-10 DIAGNOSIS — Z9889 Other specified postprocedural states: Secondary | ICD-10-CM | POA: Diagnosis not present

## 2022-03-10 DIAGNOSIS — M7582 Other shoulder lesions, left shoulder: Secondary | ICD-10-CM | POA: Diagnosis not present

## 2022-03-10 DIAGNOSIS — M1812 Unilateral primary osteoarthritis of first carpometacarpal joint, left hand: Secondary | ICD-10-CM | POA: Diagnosis not present

## 2022-03-28 DIAGNOSIS — Z7984 Long term (current) use of oral hypoglycemic drugs: Secondary | ICD-10-CM

## 2022-03-28 DIAGNOSIS — J449 Chronic obstructive pulmonary disease, unspecified: Secondary | ICD-10-CM

## 2022-03-28 DIAGNOSIS — I10 Essential (primary) hypertension: Secondary | ICD-10-CM | POA: Diagnosis not present

## 2022-03-28 DIAGNOSIS — E1159 Type 2 diabetes mellitus with other circulatory complications: Secondary | ICD-10-CM

## 2022-03-28 DIAGNOSIS — E785 Hyperlipidemia, unspecified: Secondary | ICD-10-CM

## 2022-03-29 DIAGNOSIS — M5136 Other intervertebral disc degeneration, lumbar region: Secondary | ICD-10-CM | POA: Diagnosis not present

## 2022-03-29 DIAGNOSIS — M4802 Spinal stenosis, cervical region: Secondary | ICD-10-CM | POA: Diagnosis not present

## 2022-03-29 DIAGNOSIS — M5030 Other cervical disc degeneration, unspecified cervical region: Secondary | ICD-10-CM | POA: Diagnosis not present

## 2022-03-29 DIAGNOSIS — M48062 Spinal stenosis, lumbar region with neurogenic claudication: Secondary | ICD-10-CM | POA: Diagnosis not present

## 2022-03-29 DIAGNOSIS — M5416 Radiculopathy, lumbar region: Secondary | ICD-10-CM | POA: Diagnosis not present

## 2022-03-29 DIAGNOSIS — M5412 Radiculopathy, cervical region: Secondary | ICD-10-CM | POA: Diagnosis not present

## 2022-04-05 ENCOUNTER — Other Ambulatory Visit: Payer: Self-pay | Admitting: Family Medicine

## 2022-04-21 DIAGNOSIS — M7582 Other shoulder lesions, left shoulder: Secondary | ICD-10-CM | POA: Diagnosis not present

## 2022-04-21 DIAGNOSIS — M1812 Unilateral primary osteoarthritis of first carpometacarpal joint, left hand: Secondary | ICD-10-CM | POA: Diagnosis not present

## 2022-04-21 DIAGNOSIS — M19049 Primary osteoarthritis, unspecified hand: Secondary | ICD-10-CM | POA: Diagnosis not present

## 2022-04-21 DIAGNOSIS — Z9889 Other specified postprocedural states: Secondary | ICD-10-CM | POA: Diagnosis not present

## 2022-04-21 DIAGNOSIS — M19012 Primary osteoarthritis, left shoulder: Secondary | ICD-10-CM | POA: Diagnosis not present

## 2022-04-22 ENCOUNTER — Other Ambulatory Visit: Payer: Self-pay | Admitting: Student

## 2022-04-22 DIAGNOSIS — M7582 Other shoulder lesions, left shoulder: Secondary | ICD-10-CM

## 2022-04-22 DIAGNOSIS — M19012 Primary osteoarthritis, left shoulder: Secondary | ICD-10-CM

## 2022-04-22 DIAGNOSIS — Z9889 Other specified postprocedural states: Secondary | ICD-10-CM

## 2022-04-27 ENCOUNTER — Ambulatory Visit
Admission: RE | Admit: 2022-04-27 | Discharge: 2022-04-27 | Disposition: A | Discharge status: Home / Self Care | Payer: Medicare PPO | Source: Ambulatory Visit | Attending: Student | Admitting: Student

## 2022-04-27 DIAGNOSIS — M19012 Primary osteoarthritis, left shoulder: Secondary | ICD-10-CM | POA: Insufficient documentation

## 2022-04-27 DIAGNOSIS — M7582 Other shoulder lesions, left shoulder: Secondary | ICD-10-CM | POA: Insufficient documentation

## 2022-04-27 DIAGNOSIS — M25512 Pain in left shoulder: Secondary | ICD-10-CM | POA: Diagnosis not present

## 2022-04-27 DIAGNOSIS — Z9889 Other specified postprocedural states: Secondary | ICD-10-CM | POA: Diagnosis not present

## 2022-04-27 DIAGNOSIS — M25412 Effusion, left shoulder: Secondary | ICD-10-CM | POA: Diagnosis not present

## 2022-04-28 ENCOUNTER — Other Ambulatory Visit: Payer: Self-pay | Admitting: Family Medicine

## 2022-04-28 DIAGNOSIS — K219 Gastro-esophageal reflux disease without esophagitis: Secondary | ICD-10-CM

## 2022-04-28 DIAGNOSIS — E1169 Type 2 diabetes mellitus with other specified complication: Secondary | ICD-10-CM

## 2022-04-28 DIAGNOSIS — E1142 Type 2 diabetes mellitus with diabetic polyneuropathy: Secondary | ICD-10-CM

## 2022-04-28 DIAGNOSIS — E785 Hyperlipidemia, unspecified: Secondary | ICD-10-CM

## 2022-04-28 DIAGNOSIS — I10 Essential (primary) hypertension: Secondary | ICD-10-CM

## 2022-04-28 DIAGNOSIS — E1121 Type 2 diabetes mellitus with diabetic nephropathy: Secondary | ICD-10-CM

## 2022-04-28 DIAGNOSIS — E669 Obesity, unspecified: Secondary | ICD-10-CM

## 2022-05-04 DIAGNOSIS — R600 Localized edema: Secondary | ICD-10-CM | POA: Diagnosis not present

## 2022-05-04 DIAGNOSIS — E782 Mixed hyperlipidemia: Secondary | ICD-10-CM | POA: Diagnosis not present

## 2022-05-04 DIAGNOSIS — E119 Type 2 diabetes mellitus without complications: Secondary | ICD-10-CM | POA: Diagnosis not present

## 2022-05-04 DIAGNOSIS — I10 Essential (primary) hypertension: Secondary | ICD-10-CM | POA: Diagnosis not present

## 2022-05-12 DIAGNOSIS — G8929 Other chronic pain: Secondary | ICD-10-CM | POA: Diagnosis not present

## 2022-05-12 DIAGNOSIS — M25512 Pain in left shoulder: Secondary | ICD-10-CM | POA: Diagnosis not present
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
## 2022-05-17 ENCOUNTER — Encounter: Payer: Self-pay | Admitting: Family Medicine

## 2022-05-17 ENCOUNTER — Ambulatory Visit (INDEPENDENT_AMBULATORY_CARE_PROVIDER_SITE_OTHER): Payer: Medicare PPO | Admitting: Family Medicine

## 2022-05-17 VITALS — BP 128/72 | HR 92 | Resp 16 | Ht 64.0 in | Wt 220.0 lb

## 2022-05-17 DIAGNOSIS — E785 Hyperlipidemia, unspecified: Secondary | ICD-10-CM

## 2022-05-17 DIAGNOSIS — I10 Essential (primary) hypertension: Secondary | ICD-10-CM

## 2022-05-17 DIAGNOSIS — D692 Other nonthrombocytopenic purpura: Secondary | ICD-10-CM | POA: Insufficient documentation

## 2022-05-17 DIAGNOSIS — E1169 Type 2 diabetes mellitus with other specified complication: Secondary | ICD-10-CM | POA: Diagnosis not present

## 2022-05-17 DIAGNOSIS — J411 Mucopurulent chronic bronchitis: Secondary | ICD-10-CM | POA: Diagnosis not present

## 2022-05-17 DIAGNOSIS — E1121 Type 2 diabetes mellitus with diabetic nephropathy: Secondary | ICD-10-CM

## 2022-05-17 LAB — POCT GLYCOSYLATED HEMOGLOBIN (HGB A1C): Hemoglobin A1C: 6.8 % — AB (ref 4.0–5.6)

## 2022-05-31 ENCOUNTER — Telehealth: Payer: Self-pay

## 2022-05-31 NOTE — Progress Notes (Signed)
Chronic Care Management APPOINTMENT REMINDER   Called SHELBIA SCINTO, No answer, left message of appointment on 06/02/2022 at 9:15 am via telephone visit with Junius Argyle , Pharm D. Notified to have all medications, supplements, blood pressure and/or blood sugar logs available during appointment and to return call if need to reschedule.   [facility] Pharmacist Assistant [phone]

## 2022-06-02 ENCOUNTER — Ambulatory Visit (INDEPENDENT_AMBULATORY_CARE_PROVIDER_SITE_OTHER): Payer: Medicare HMO

## 2022-06-02 DIAGNOSIS — E785 Hyperlipidemia, unspecified: Secondary | ICD-10-CM

## 2022-06-02 DIAGNOSIS — I10 Essential (primary) hypertension: Secondary | ICD-10-CM

## 2022-06-02 NOTE — Patient Instructions (Signed)
Visit Information It was great speaking with you today!  Please let me know if you have any questions about our visit.   Goals Addressed             This Visit's Progress    Monitor and Manage My Blood Sugar-Diabetes Type 2   On track    Timeframe:  Long-Range Goal Priority:  High Start Date:  02/05/2021                           Expected End Date:  08/09/2023                     Follow Up within 60 days   -Check blood sugar daily before breakfast - check blood sugar if I feel it is too high or too low - enter blood sugar readings and medication or insulin into daily log - take the blood sugar log to all doctor visits    Why is this important?   Checking your blood sugar at home helps to keep it from getting very high or very low.  Writing the results in a diary or log helps the doctor know how to care for you.  Your blood sugar log should have the time, date and the results.  Also, write down the amount of insulin or other medicine that you take.  Other information, like what you ate, exercise done and how you were feeling, will also be helpful.     Notes:         Patient Care Plan: General Pharmacy (Adult)     Problem Identified: Hypertension, Hyperlipidemia, Diabetes, COPD, Depression, Anxiety and Chronic Pain, Insomnia   Priority: High     Long-Range Goal: Patient-Specific Goal   Start Date: 02/05/2021  Expected End Date: 06/03/2023  This Visit's Progress: On track  Recent Progress: On track  Priority: High  Note:   Current Barriers:  Unable to independently afford treatment regimen Unable to achieve control of Diabetes   Pharmacist Clinical Goal(s):  Over the next 90 days, patient will achieve control of diabetes as evidenced by A1c less than 8% through collaboration with PharmD and provider.   Interventions: 1:1 collaboration with Steele Sizer, MD regarding development and update of comprehensive plan of care as evidenced by provider attestation and  co-signature Inter-disciplinary care team collaboration (see longitudinal plan of care) Comprehensive medication review performed; medication list updated in electronic medical record  Diabetes (A1c goal <7%) -Controlled -Current medications: Trulicity 3 mg weekly: Appropriate, Effective, Safe, Accessible Metformin XR 750 mg 2 tablets daily: Appropriate, Effective, Safe, Accessible  -Medications previously tried: Actos (hypoglycemia), glipizide, Invokana, Soliqua,   -Nausea has improved, still has some mild bouts.  -Current home glucose readings:  Fasting: 129, 140, 120, 151 (popcorn), 117, 118  -Denies hypoglycemic symptoms: .  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 -Continue current medications  Hypertension (BP goal <140/90) -Controlled -Current treatment: Carvedilol 6.25 mg twice daily: Appropriate, Effective, Safe, Accessible  Furosemide 20 mg daily: Appropriate, Effective, Safe, Accessible  Lisinopril 40 mg daily: Appropriate, Effective, Safe, Accessible  -Medications previously tried: NA  -Current home readings: NA -Denies hypotensive/hypertensive symptoms -Recommended to continue current medication  Hyperlipidemia: (LDL goal < 70) -Controlled -Current treatment: Atorvastatin 80 mg daily  -Current antiplatelet treatment: Aspirin 81 mg  daily  -Medications previously tried: NA  -Educated on Importance of limiting foods high in cholesterol; -Recommended to continue current medication  COPD (Goal: control symptoms and prevent exacerbations) -Controlled -Current treatment  Ventolin HFA 2 puffs every 6 hours as needed  Bevespi 2 puffs at bedtime  -Medications previously tried: NA  -Gold Grade: Gold 1 (FEV1>80%) -Current COPD Classification:  A (low sx, <2 exacerbations/yr) -MMRC/CAT score:  NA -Pulmonary function testing: FEV1 92% (Jun 2021) -Exacerbations requiring treatment in last 6 months: None -Patient reports consistent use of maintenance inhaler -Frequency of rescue inhaler use: Infrequently -Counseled on Benefits of consistent maintenance inhaler use -Recommended to continue current medication  Insomnia (Goal: Improve sleep quality) -Controlled -Current treatment: Trazodone 50 mg 1/2 - 1 tablet nightly as needed - Sleep -Medications previously tried/failed: NA -PHQ9: 4 -GAD7: NA -Educated on Benefits of medication for symptom control -Recommended to continue current medication  Patient Goals/Self-Care Activities Over the next 90 days, patient will:  - check glucose daily before breakfast, document, and provide at future appointments  Follow Up Plan: Telephone follow up appointment with care management team member scheduled for:  12/08/22 at 9:15 AM    Patient agreed to services and verbal consent obtained.   Patient verbalizes understanding of instructions and care plan provided today and agrees to view in [place]. Active MyChart status and patient understanding of how to access instructions and care plan via MyChart confirmed with patient.     Malva Limes, [place] Pharmacist Practitioner  [facility] [phone]

## 2022-06-02 NOTE — Progress Notes (Signed)
Chronic Care Management Pharmacy Note  06/02/2022 Name:  Cindy Griffin MRN:  [id number] DOB:  1946-03-11  Summary: Patient presents for CCM follow-up.   Recommendations/Changes made from today's visit: -Continue current medications  Plan: CPP follow-up 6 months  Subjective: Cindy Griffin is an 76 y.o. year old female who is a primary patient of Steele Sizer, MD.  The CCM team was consulted for assistance with disease management and care coordination needs.    Engaged with patient by telephone for follow up visit in response to provider referral for pharmacy case management and/or care coordination services.   Consent to Services:  The patient was given information about Chronic Care Management services, agreed to services, and gave verbal consent prior to initiation of services.  Please see initial visit note for detailed documentation.   Patient Care Team: Steele Sizer, MD as PCP - General (Family Medicine) Sharlet Salina, MD as Consulting Physician (Physical Medicine and Rehabilitation) Merlene Morse, MD as Consulting Physician (Orthopedic Surgery) Ileana Roup, MD as Consulting Physician (General Surgery) Germaine Pomfret, [facility] as Pharmacist (Pharmacist) Corey Skains, MD as Consulting Physician (Cardiology) Erby Pian, MD as Referring Physician (Pulmonary Disease) Cammie Sickle, MD as Consulting Physician (Hematology and Oncology)  Recent office visits: 05/17/22: Patient presented to Dr. Ancil Boozer for follow-up. A1c 6.8%.   01/05/22: Patient presented to Dr. Ancil Boozer for follow-up. A1c 6.8%.   07/16/21: Patient presented to Clemetine Marker, LPN for AWV.  Recent consult visits: 05/04/22: Patient presented to Jettie Booze, [place] (Cardiology).   Hospital visits: None in previous 6 months  Objective:  Lab Results  Component Value Date   CREATININE 0.80 01/05/2022   BUN 19 01/05/2022   GFRNONAA 67 12/10/2020   GFRAA 77 12/10/2020   NA 141  01/05/2022   K 4.8 01/05/2022   CALCIUM 10.1 01/05/2022   CO2 29 01/05/2022    Lab Results  Component Value Date/Time   HGBA1C 6.8 (A) 05/17/2022 10:39 AM   HGBA1C 6.8 (A) 01/05/2022 01:19 PM   HGBA1C 9.3 (A) 07/03/2019 09:19 AM   HGBA1C 6.9 03/01/2019 10:05 AM   FRUCTOSAMINE 264 12/27/2017 09:56 AM   MICROALBUR 1.6 01/05/2022 01:59 PM   MICROALBUR <0.2 12/10/2020 12:00 AM   MICROALBUR 50 07/22/2017 09:00 AM   MICROALBUR 50 07/15/2016 10:52 AM    Last diabetic Eye exam:  Lab Results  Component Value Date/Time   HMDIABEYEEXA No Retinopathy 10/08/2020 12:00 AM    Last diabetic Foot exam: No results found for: "HMDIABFOOTEX"   Lab Results  Component Value Date   CHOL 129 01/05/2022   HDL 65 01/05/2022   LDLCALC 48 01/05/2022   TRIG 79 01/05/2022   CHOLHDL 2.0 01/05/2022       Latest Ref Rng & Units 01/05/2022    1:59 PM 12/10/2020   12:00 AM 06/23/2020    1:26 PM  Hepatic Function  Total Protein 6.1 - 8.1 g/dL 7.3  7.4  7.4   Albumin 3.5 - 5.0 g/dL   4.0   AST 10 - 35 U/L 16  15  22    ALT 6 - 29 U/L 17  15  18    Alk Phosphatase 38 - 126 U/L   76   Total Bilirubin 0.2 - 1.2 mg/dL 0.5  0.3  0.5     No results found for: "TSH", "FREET4"     Latest Ref Rng & Units 01/05/2022    1:59 PM 12/10/2020   12:00 AM 09/09/2020    1:08  PM  CBC  WBC 3.8 - 10.8 Thousand/uL 10.5  6.7  6.7   Hemoglobin 11.7 - 15.5 g/dL 12.9  13.2  12.3   Hematocrit 35.0 - 45.0 % 39.2  39.2  38.8   Platelets 140 - 400 Thousand/uL 330  344  292     Lab Results  Component Value Date/Time   VD25OH 40 01/05/2022 01:59 PM   VD25OH 30 12/10/2020 12:00 AM    Clinical ASCVD: No  The ASCVD Risk score (Arnett DK, et al., 2019) failed to calculate for the following reasons:   The valid total cholesterol range is 130 to 320 mg/dL       05/17/2022   10:35 AM 01/05/2022    1:16 PM 08/21/2021   10:32 AM  Depression screen PHQ 2/9  Decreased Interest 0 0 0  Down, Depressed, Hopeless 0 0 0  PHQ - 2  Score 0 0 0  Altered sleeping 1 3 2   Tired, decreased energy 1 3 1   Change in appetite 0 0 1  Feeling bad or failure about yourself  0 0 0  Trouble concentrating 0 0 0  Moving slowly or fidgety/restless 0 0 0  Suicidal thoughts 0 0 0  PHQ-9 Score 2 6 4   Difficult doing work/chores   Not difficult at all    Social History   Tobacco Use  Smoking Status Never  Smokeless Tobacco Never  Tobacco Comments   smoking cessation materials not required   BP Readings from Last 3 Encounters:  05/17/22 128/72  01/22/22 101/82  01/05/22 134/86   Pulse Readings from Last 3 Encounters:  05/17/22 92  01/22/22 76  01/05/22 89   Wt Readings from Last 3 Encounters:  05/17/22 220 lb (99.8 kg)  04/27/22 230 lb (104.3 kg)  01/22/22 230 lb (104.3 kg)    Assessment/Interventions: Review of patient past medical history, allergies, medications, health status, including review of consultants reports, laboratory and other test data, was performed as part of comprehensive evaluation and provision of chronic care management services.   SDOH:  (Social Determinants of Health) assessments and interventions performed: Yes        CCM Care Plan  Allergies  Allergen Reactions   Doxycycline Rash    Itching    Medications Reviewed Today     Reviewed by Steele Sizer, MD (Physician) on 05/17/22 at 1105  Med List Status: <None>   Medication Order Taking? Sig Documenting Provider Last Dose Status Informant  acetaminophen (TYLENOL) 500 MG tablet [id number] Yes Take 1,000 mg by mouth every 6 (six) hours as needed for moderate pain or headache.  [provider] Taking Active Self  albuterol (VENTOLIN HFA) 108 (90 Base) MCG/ACT inhaler [id number] Yes INHALE 2 PUFFS INTO THE LUNGS EVERY 6 HOURS AS NEEDED FOR WHEEZING OR SHORTNESS OF BREATH. Steele Sizer, MD Taking Active   Ascorbic Acid (VITAMIN C) 1000 MG tablet [id number] Yes Take 1,000 mg by mouth daily. [provider] Taking Active  Self  aspirin EC 81 MG tablet [id number] Yes Take 1 tablet (81 mg total) by mouth daily. Steele Sizer, MD Taking Active Self  atorvastatin (LIPITOR) 80 MG tablet [id number] Yes TAKE 1 TABLET EVERY DAY Teodora Medici, DO Taking Active   BD PEN NEEDLE MICRO U/F 32G X 6 MM MISC [id number] Yes USE ONCE DAILY Steele Sizer, MD Taking Active   Biotin (BIOTIN MAXIMUM STRENGTH) 5 MG CAPS [id number] Yes Take by mouth. [provider] Taking Active   Biotin 5000 MCG CAPS [id number]  Yes Take by mouth. [provider] Taking Active Self  [place] 5-2.5-18.5 LF-MCG/0.5 injection [id number] Yes  [provider] Taking Active   Carboxymethylcellul-Glycerin (LUBRICATING EYE DROPS OP) [id number] Yes Place 2 drops into both eyes 2 (two) times daily as needed (for dry eyes). [provider] Taking Active Self  carvedilol (COREG) 3.125 MG tablet [id number]  Take 3.125 mg by mouth 2 (two) times daily with a meal. [provider]  Expired 12/24/21 2359            Med Note Michaelle Birks, Eyal Greenhaw A   Wed Mar 11, 2021  1:26 PM) Prescribed by Dr. Nehemiah Massed  cetirizine (ZYRTEC) 10 MG tablet [id number] Yes Take 10 mg by mouth every other day as needed for allergies.  [provider] Taking Active Self           Med Note Larwance Sachs A   Thu May 06, 2016 12:00 PM)    Cholecalciferol (VITAMIN D-3) 1000 units CAPS [id number] Yes Take 1,000 Units by mouth daily.  [provider] Taking Active Self  Dulaglutide (TRULICITY) 3 TS/1.7BL SOPN [id number] Yes Inject 3 mg as directed once a week. Patient receives through [employer] Patient Assistance through Dec 2023 Steele Sizer, MD Taking Active   famotidine (PEPCID) 20 MG tablet [id number] Yes TAKE 1 TABLET TWICE DAILY Teodora Medici, DO Taking Active   fluticasone [facility]) 50 MCG/ACT nasal spray [id number] Yes USE 2 SPRAYS IN [facility] NOSTRIL EVERY Eloise Harman, MD Taking Active   furosemide (LASIX) 20 MG tablet  [id number] Yes TAKE 1 TABLET EVERY Eloise Harman, MD Taking Active   Glycopyrrolate-Formoterol (BEVESPI AEROSPHERE) 9-4.8 MCG/ACT AERO [id number] Yes INHALE 2 PUFFS BY MOUTH AT BEDTIME. Patient receives through [place] Patient [place] Steele Sizer, MD Taking Active   Lancets (ONETOUCH DELICA PLUS [id number]) [place] [id number] Yes CHECK GLUCOSE THREE TIMES DAILY [provider] Taking Active   lisinopril (ZESTRIL) 40 MG tablet [id number] Yes TAKE 1 TABLET AT BEDTIME Teodora Medici, DO Taking Active   meclizine (ANTIVERT) 25 MG tablet [id number] Yes Take 1 tablet (25 mg total) by mouth every 6 (six) hours. Steele Sizer, MD Taking Active   metFORMIN (GLUCOPHAGE-XR) 750 MG 24 hr tablet [id number] Yes TAKE 2 TABLETS (1,500 MG TOTAL) BY MOUTH DAILY WITH BREAKFAST. Teodora Medici, DO Taking Active   Multiple Vitamins-Minerals (MULTIVITAMIN PO) [id number] Yes Take 1 tablet by mouth daily. [provider] Taking Active Self  Omega-3 1000 MG CAPS [id number] Yes Take by mouth. [provider] Taking Active   polyethylene glycol-electrolytes (NULYTELY) 420 g solution [id number] Yes See admin instructions. [provider] Taking Active   pregabalin (LYRICA) 75 MG capsule [id number] Yes Take 1 capsule (75 mg total) by mouth 2 (two) times daily. Steele Sizer, MD Taking Active   tiZANidine (ZANAFLEX) 2 MG tablet [id number] Yes 1 po tid prn [provider] Taking Active   traMADol (ULTRAM) 50 MG tablet [id number] Yes Take 1 tablet by mouth 2 (two) times daily as needed.  Sharlet Salina, MD Taking Active   traZODone (DESYREL) 50 MG tablet [id number] Yes TAKE 1/2 TO 1 TABLET AT BEDTIME AS NEEDED FOR SLEEP Steele Sizer, MD Taking Active   Turmeric 500 MG CAPS [id number] Yes Take by mouth. [provider] Taking Active Self  vitamin B-12 (CYANOCOBALAMIN) 500 MCG tablet [id number] Yes Take 1,000 mcg by mouth daily. [provider] Taking Active Self   vitamin B-12 (CYANOCOBALAMIN) 500 MCG tablet [id number] Yes Take by mouth. [provider] Taking Active  Patient Active Problem List   Diagnosis Date Noted   Senile purpura ([place]) 05/17/2022   Bilateral leg edema 05/01/2020   SOBOE (shortness of breath on exertion) 05/01/2020   Colon cancer, ascending ([place]) 05/25/2019   Mild episode of recurrent major depressive disorder ([place]) 08/01/2018   DDD (degenerative disc disease), lumbar 07/28/2015   Spinal stenosis at L4-L5 level 06/12/2015   Benign essential HTN 05/25/2015   Bunion 05/25/2015   Cardiac enlargement 05/25/2015   Cervical radicular pain 05/25/2015   Back pain, chronic 05/25/2015   Osteoarthritis 05/25/2015   Dyslipidemia 05/25/2015   Gastric reflux 05/25/2015   Insomnia 05/25/2015   Eczema intertrigo 05/25/2015   Bronchitis, chronic, mucopurulent ([place]) 05/25/2015   Numerous moles 05/25/2015   Morbid obesity ([place]) 05/25/2015   Perennial allergic rhinitis 05/25/2015   History of artificial joint 05/25/2015   Dyslipidemia associated with type 2 diabetes mellitus ([place]) 05/25/2015   Degeneration of intervertebral disc of cervical region 02/06/2015   Neuritis or radiculitis due to rupture of lumbar intervertebral disc 01/14/2015   Vitamin D deficiency 05/04/2010    Immunization History  Administered Date(s) Administered   Fluad Quad(high Dose 65+) 11/12/2019, 09/03/2020, 08/21/2021   Influenza, High Dose Seasonal PF 08/29/2015, 08/25/2016, 08/01/2018   Influenza, Seasonal, Injecte, Preservative Fre 08/03/2011, 08/21/2012   Influenza,inj,Quad PF,6+ Mos 08/13/2013, 07/17/2014   Influenza-Unspecified 07/17/2014, 08/31/2017   PFIZER(Purple Top)SARS-COV-2 Vaccination 01/08/2020, 01/29/2020, 08/27/2020, 03/19/2021   PPD Test 10/08/2015   Pfizer Covid-19 Vaccine Bivalent Booster 5y-11y 08/10/2021   Pneumococcal Conjugate-13 07/17/2014   Pneumococcal Polysaccharide-23 05/04/2010, 08/29/2015   Tdap  05/04/2010, 01/05/2022   Zoster Recombinat (Shingrix) 09/18/2018, 11/27/2018   Zoster, Live 12/25/2010    Conditions to be addressed/monitored:  Hypertension, Hyperlipidemia, Diabetes, COPD, Depression, Anxiety and Chronic Pain, Insomnia  Care Plan : General Pharmacy (Adult)  Updates made by Germaine Pomfret, RPH since 06/02/2022 12:00 AM     Problem: Hypertension, Hyperlipidemia, Diabetes, COPD, Depression, Anxiety and Chronic Pain, Insomnia   Priority: High     Long-Range Goal: Patient-Specific Goal   Start Date: 02/05/2021  Expected End Date: 06/03/2023  This Visit's Progress: On track  Recent Progress: On track  Priority: High  Note:   Current Barriers:  Unable to independently afford treatment regimen Unable to achieve control of Diabetes   Pharmacist Clinical Goal(s):  Over the next 90 days, patient will achieve control of diabetes as evidenced by A1c less than 8% through collaboration with PharmD and provider.   Interventions: 1:1 collaboration with Steele Sizer, MD regarding development and update of comprehensive plan of care as evidenced by provider attestation and co-signature Inter-disciplinary care team collaboration (see longitudinal plan of care) Comprehensive medication review performed; medication list updated in electronic medical record  Diabetes (A1c goal <7%) -Controlled -Current medications: Trulicity 3 mg weekly: Appropriate, Effective, Safe, Accessible Metformin XR 750 mg 2 tablets daily: Appropriate, Effective, Safe, Accessible  -Medications previously tried: Actos (hypoglycemia), glipizide, Invokana, Soliqua,   -Nausea has improved, still has some mild bouts.  -Current home glucose readings:  Fasting: 129, 140, 120, 151 (popcorn), 117, 118  -Denies hypoglycemic symptoms: .  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 -Continue current medications  Hypertension (BP goal <140/90) -Controlled -Current treatment: Carvedilol 6.25 mg twice daily: Appropriate, Effective, Safe, Accessible  Furosemide 20 mg daily: Appropriate, Effective, Safe, Accessible  Lisinopril 40 mg  daily: Appropriate, Effective, Safe, Accessible  -Medications previously tried: NA  -Current home readings: NA -Denies hypotensive/hypertensive symptoms -Recommended to continue current medication  Hyperlipidemia: (LDL goal < 70) -Controlled -Current treatment: Atorvastatin 80 mg daily  -Current antiplatelet treatment: Aspirin 81 mg daily  -Medications previously tried: NA  -Educated on Importance of limiting foods high in cholesterol; -Recommended to continue current medication  COPD (Goal: control symptoms and prevent exacerbations) -Controlled -Current treatment  Ventolin HFA 2 puffs every 6 hours as needed  Bevespi 2 puffs at bedtime  -Medications previously tried: NA  -Gold Grade: Gold 1 (FEV1>80%) -Current COPD Classification:  A (low sx, <2 exacerbations/yr) -MMRC/CAT score: NA -Pulmonary function testing: FEV1 92% (Jun 2021) -Exacerbations requiring treatment in last 6 months: None -Patient reports consistent use of maintenance inhaler -Frequency of rescue inhaler use: Infrequently -Counseled on Benefits of consistent maintenance inhaler use -Recommended to continue current medication  Insomnia (Goal: Improve sleep quality) -Controlled -Current treatment: Trazodone 50 mg 1/2 - 1 tablet nightly as needed - Sleep -Medications previously tried/failed: NA -PHQ9: 4 -GAD7: NA -Educated on Benefits of medication for symptom control -Recommended to continue current medication  Patient Goals/Self-Care Activities Over the next 90 days, patient will:  - check glucose daily before breakfast, document, and provide at future appointments  Follow Up Plan: Telephone follow up  appointment with care management team member scheduled for:  12/08/22 at 9:15 AM    Medication Assistance:  Trulicity obtained through [employer] medication assistance program.  Enrollment ends Dec 2023 Bevespi obtained through [place] medication [place].  Patient's preferred pharmacy is:  [facility] [place], [place] [address] Phone: [phone] Fax: [phone]  [place] [address] [place]) [place] [address] Phone: [phone] Fax: [place], [place] [address] [place] [address] Phone: [phone] Fax: [phone]   Uses pill box? Yes Pt endorses 100% compliance  We discussed: Current pharmacy is preferred with insurance plan and patient is satisfied with pharmacy services Patient decided to: Continue current medication management strategy  Care Plan and Follow Up Patient Decision:  Patient agrees to Care Plan and Follow-up.  Plan: Telephone follow up appointment with care management team member scheduled for:  12/08/22 at [place], [place], [place] Pharmacist Practitioner  [facility] [phone]

## 2022-06-07 DIAGNOSIS — M4802 Spinal stenosis, cervical region: Secondary | ICD-10-CM | POA: Diagnosis not present

## 2022-06-07 DIAGNOSIS — M5412 Radiculopathy, cervical region: Secondary | ICD-10-CM | POA: Diagnosis not present

## 2022-06-07 DIAGNOSIS — Z79899 Other long term (current) drug therapy: Secondary | ICD-10-CM | POA: Diagnosis not present

## 2022-06-07 DIAGNOSIS — M48062 Spinal stenosis, lumbar region with neurogenic claudication: Secondary | ICD-10-CM | POA: Diagnosis not present

## 2022-06-07 DIAGNOSIS — M5136 Other intervertebral disc degeneration, lumbar region: Secondary | ICD-10-CM | POA: Diagnosis not present

## 2022-06-07 DIAGNOSIS — M5030 Other cervical disc degeneration, unspecified cervical region: Secondary | ICD-10-CM | POA: Diagnosis not present

## 2022-06-07 DIAGNOSIS — M5416 Radiculopathy, lumbar region: Secondary | ICD-10-CM | POA: Diagnosis not present

## 2022-06-18 ENCOUNTER — Other Ambulatory Visit: Payer: Self-pay | Admitting: Surgery

## 2022-06-18 ENCOUNTER — Other Ambulatory Visit: Payer: Self-pay | Admitting: Family Medicine

## 2022-06-28 DIAGNOSIS — E1169 Type 2 diabetes mellitus with other specified complication: Secondary | ICD-10-CM

## 2022-06-28 DIAGNOSIS — E785 Hyperlipidemia, unspecified: Secondary | ICD-10-CM

## 2022-06-28 DIAGNOSIS — I10 Essential (primary) hypertension: Secondary | ICD-10-CM

## 2022-06-30 DIAGNOSIS — M19049 Primary osteoarthritis, unspecified hand: Secondary | ICD-10-CM | POA: Diagnosis not present

## 2022-06-30 DIAGNOSIS — G5602 Carpal tunnel syndrome, left upper limb: Secondary | ICD-10-CM | POA: Diagnosis not present

## 2022-07-04 ENCOUNTER — Other Ambulatory Visit: Payer: Self-pay | Admitting: Family Medicine

## 2022-07-05 ENCOUNTER — Encounter
Admission: RE | Admit: 2022-07-05 | Discharge: 2022-07-05 | Disposition: A | Discharge status: Home / Self Care | Payer: Medicare PPO | Source: Ambulatory Visit | Attending: Surgery | Admitting: Surgery

## 2022-07-05 ENCOUNTER — Other Ambulatory Visit: Payer: Self-pay

## 2022-07-05 DIAGNOSIS — Z01810 Encounter for preprocedural cardiovascular examination: Secondary | ICD-10-CM | POA: Diagnosis not present

## 2022-07-05 DIAGNOSIS — I10 Essential (primary) hypertension: Secondary | ICD-10-CM | POA: Diagnosis not present

## 2022-07-05 DIAGNOSIS — Z01818 Encounter for other preprocedural examination: Secondary | ICD-10-CM | POA: Insufficient documentation

## 2022-07-05 DIAGNOSIS — Z01812 Encounter for preprocedural laboratory examination: Secondary | ICD-10-CM

## 2022-07-05 HISTORY — DX: Dyspnea, unspecified: R06.00

## 2022-07-05 HISTORY — DX: Family history of other specified conditions: Z84.89

## 2022-07-05 LAB — BASIC METABOLIC PANEL
Anion gap: 9 (ref 5–15)
BUN: 16 mg/dL (ref 8–23)
CO2: 26 mmol/L (ref 22–32)
Calcium: 9.7 mg/dL (ref 8.9–10.3)
Chloride: 107 mmol/L (ref 98–111)
Creatinine, Ser: 0.82 mg/dL (ref 0.44–1.00)
GFR, Estimated: >60 mL/min (ref >60)
Glucose, Bld: 166 mg/dL — ABNORMAL HIGH (ref 70–99)
Potassium: 4.6 mmol/L (ref 3.5–5.1)
Sodium: 142 mmol/L (ref 135–145)

## 2022-07-05 LAB — CBC
HCT: 36.7 % (ref 36.0–46.0)
Hemoglobin: 11.5 g/dL — ABNORMAL LOW (ref 12.0–15.0)
MCH: 27.9 pg (ref 26.0–34.0)
MCHC: 31.3 g/dL (ref 30.0–36.0)
MCV: 89.1 fL (ref 80.0–100.0)
Platelets: 279 10*3/uL (ref 150–400)
RBC: 4.12 MIL/uL (ref 3.87–5.11)
RDW: 14.8 % (ref 11.5–15.5)
WBC: 5.4 10*3/uL (ref 4.0–10.5)
nRBC: 0 % (ref 0.0–0.2)

## 2022-07-05 NOTE — Patient Instructions (Addendum)
Your procedure is scheduled on: 07/14/22- Wednesday Report to the Registration Desk on the 1st floor of the [facility]. To find out your arrival time, please call [phone] between 1PM - 3PM on: 07/13/22 - Tuesday If your arrival time is 6:00 am, do not arrive prior to that time as the [facility] entrance doors do not open until 6:00 am.  REMEMBER: Instructions that are not followed completely may result in serious medical risk, up to and including death; or upon the discretion of your surgeon and anesthesiologist your surgery may need to be rescheduled.  Do not eat food after midnight the night before surgery.  No gum chewing, lozengers or hard candies.  You may however, drink CLEAR liquids up to 2 hours before you are scheduled to arrive for your surgery. Do not drink anything within 2 hours of your scheduled arrival time. Type 1 and Type 2 diabetics should only drink water.  In addition, your doctor has ordered for you to drink the provided  Gatorade G2 Drinking this carbohydrate drink up to two hours before surgery helps to reduce insulin resistance and improve patient outcomes. Please complete drinking 2 hours prior to scheduled arrival time.  TAKE ONLY THESE MEDICATIONS THE MORNING OF SURGERY WITH A SIP OF WATER:  - carvedilol (COREG)  - fluticasone (FLONASE)  - pregabalin (LYRICA)  - famotidine (PEPCID)   Use inhaler albuterol (VENTOLIN HFA) 108 (90 Base) on the day of surgery and bring to the hospital.  Hold Dulaglutide (TRULICITY) 3 for 7 days prior to your surgery, may resume the day after.  Hold metFORMIN (GLUCOPHAGE-XR) beginning 07/12/22, may resume taking the day after surgery.  One week prior to surgery: Stop Anti-inflammatories (NSAIDS) such as Advil, Aleve, Ibuprofen, Motrin, Naproxen, Naprosyn and Aspirin based products such as Excedrin, Goodys Powder, BC Powder.  Stop 07/07/22: ANY OVER THE COUNTER supplements until after surgery. VITAMIN D-3, Multiple  Vitamins-Minerals , Omega-3, Turmeric , vitamin B-12.  You may however, continue to take Tylenol if needed for pain up until the day of surgery.  No Alcohol for 24 hours before or after surgery.  No Smoking including e-cigarettes for 24 hours prior to surgery.  No chewable tobacco products for at least 6 hours prior to surgery.  No nicotine patches on the day of surgery.  Do not use any "recreational" drugs for at least a week prior to your surgery.  Please be advised that the combination of cocaine and anesthesia may have negative outcomes, up to and including death. If you test positive for cocaine, your surgery will be cancelled.  On the morning of surgery brush your teeth with toothpaste and water, you may rinse your mouth with mouthwash if you wish. Do not swallow any toothpaste or mouthwash.  Use CHG Soap or wipes as directed on instruction sheet.  Do not wear jewelry, make-up, hairpins, clips or nail polish.  Do not wear lotions, powders, or perfumes.   Do not shave body from the neck down 48 hours prior to surgery just in case you cut yourself which could leave a site for infection.  Also, freshly shaved skin may become irritated if using the CHG soap.  Contact lenses, hearing aids and dentures may not be worn into surgery.  Do not bring valuables to the hospital. [facility] is not responsible for any missing/lost belongings or valuables.   Notify your doctor if there is any change in your medical condition (cold, fever, infection).  Wear comfortable clothing (specific to your  surgery type) to the hospital.  After surgery, you can help prevent lung complications by doing breathing exercises.  Take deep breaths and cough every 1-2 hours. Your doctor may order a device called an Incentive Spirometer to help you take deep breaths. When coughing or sneezing, hold a pillow firmly against your incision with both hands. This is called "splinting." Doing this helps protect your  incision. It also decreases belly discomfort.  If you are being admitted to the hospital overnight, leave your suitcase in the car. After surgery it may be brought to your room.  If you are being discharged the day of surgery, you will not be allowed to drive home. You will need a responsible adult (18 years or older) to drive you home and stay with you that night.   If you are taking public transportation, you will need to have a responsible adult (18 years or older) with you. Please confirm with your physician that it is acceptable to use public transportation.   Please call the [facility] at [phone] if you have any questions about these instructions.  Surgery Visitation Policy:  Patients undergoing a surgery or procedure may have two family members or support persons with them as long as the person is not COVID-19 positive or experiencing its symptoms.   Inpatient Visitation:    Visiting hours are 7 a.m. to 8 p.m. Up to four visitors are allowed at one time in a patient room, including children. The visitors may rotate out with other people during the day. One designated support person (adult) may remain overnight.

## 2022-07-07 NOTE — Progress Notes (Signed)
  Perioperative Services Pre-Admission/Anesthesia Testing    Date: 07/07/22  Name: Cindy Griffin MRN:   [id number]  Re: GLP-1 clearance and provider recommendations   Planned Surgical Procedure(s):    Case: [id number] Date/Time: 07/14/22 0715   Procedure: LEFT THUMB SUSPENSION CMC ARTHROPLASTY, AND  ENDOSCOPIC LEFT CARPAL TUNNEL RELEASE (Left: Thumb)   Anesthesia type: Choice   Pre-op diagnosis:      Arthritis of carpometacarpal CMC joint of left thumb M18.12     CMC arthritis ,CARPAL TUNNEL SYNDROME, LEFT   Location: [facility] OR ROOM 02 / [facility]   Surgeons: Corky Mull, MD   Clinical Notes:  Patient is scheduled for the above procedure on 08/14/2022 with Dr. Milagros Evener, MD. In review of her medication reconciliation it was noted that patient is on a prescribed GLP-1 medication. Per guidelines issued by the American Society of Anesthesiologists (ASA), it is recommended that these medications be held for 7 days prior to the patient undergoing any type of elective surgical procedure. The patient is taking the following GLP-1 medication:  []  SEMAGLUTIDE (Ozempic, Rybelsus)  []  EXENATIDE (Bydureon, Byetta) []  LIRAGLUTIDE (Victoza, Saxenda)  []  LIXISENATIDE (Adalyxin) [x]  DULAGLUTIDE (Trulicity)    []  OTHER GLP-1 medication: _______________  Reached out to prescribing provider Cindy Boozer, MD) to make them aware of the guidelines from anesthesia. Given that this patient takes the prescribed GLP-1 medication for her  diabetes diagnosis, rather than for weight loss, recommendations from the prescribing provider were solicited. Prescribing provider made aware of the following so that informed decision/POC can be developed for this patient that may be taking medications belonging to these drug classes:  Oral GLP-1 medications will be held 1 day prior to surgery.  Injectable GLP-1 medications will be held 7 days prior to surgery.  Metformin is routinely held 48 hours prior to  surgery due to renal concerns, potential need for contrasted imaging perioperatively, and the potential for tissue hypoxia leading to drug induced lactic acidosis.  All SGLT2i medications are held 72 hours prior to surgery as they can be associated with the increased potential for developing euglycemic diabetic ketoacidosis (EDKA).   Impression and Plan:  Cindy Griffin is on a prescribed GLP-1 medication, which induces the known side effect of decreased gastric emptying. Efforts are bring made to mitigate the risk of perioperative hyperglycemic events, as elevated blood glucose levels have been found to contribute to intra/postoperative complications. Additionally, hyperglycemic extremes can potentially necessitate the postponing of a patient's elective case in order to better optimize perioperative glycemic control, again with the aforementioned guidelines in place. With this in mind, recommendations have been sought from the prescribing provider, who has cleared patient to proceed with holding the prescribed GLP-1 as per the guidelines from the ASA.   Provider recommending: no further recommendations received from the prescribing provider.  Copy of signed clearance and recommendations placed on patient's chart for inclusion in their medical record and for review by the surgical/anesthetic team on the day of her procedure.   Cindy Loh, MSN, APRN, FNP-C, CEN [facility]  Peri-operative Services Nurse Practitioner Phone: [phone] 07/07/22 10:05 AM  NOTE: This note has been prepared using Dragon dictation software. Despite my best ability to proofread, there is always the potential that unintentional transcriptional errors may still occur from this process.

## 2022-07-12 ENCOUNTER — Other Ambulatory Visit: Payer: Self-pay | Admitting: Family Medicine

## 2022-07-12 DIAGNOSIS — Z780 Asymptomatic menopausal state: Secondary | ICD-10-CM

## 2022-07-13 ENCOUNTER — Inpatient Hospital Stay: Payer: Medicare PPO | Attending: Internal Medicine

## 2022-07-13 DIAGNOSIS — C182 Malignant neoplasm of ascending colon: Secondary | ICD-10-CM | POA: Insufficient documentation

## 2022-07-13 LAB — CBC WITH DIFFERENTIAL/PLATELET
Abs Immature Granulocytes: 0.02 10*3/uL (ref 0.00–0.07)
Basophils Absolute: 0 10*3/uL (ref 0.0–0.1)
Basophils Relative: 1 %
Eosinophils Absolute: 0.1 10*3/uL (ref 0.0–0.5)
Eosinophils Relative: 1 %
HCT: 38.1 % (ref 36.0–46.0)
Hemoglobin: 12.3 g/dL (ref 12.0–15.0)
Immature Granulocytes: 0 %
Lymphocytes Relative: 22 %
Lymphs Abs: 1.3 10*3/uL (ref 0.7–4.0)
MCH: 28.9 pg (ref 26.0–34.0)
MCHC: 32.3 g/dL (ref 30.0–36.0)
MCV: 89.6 fL (ref 80.0–100.0)
Monocytes Absolute: 0.4 10*3/uL (ref 0.1–1.0)
Monocytes Relative: 6 %
Neutro Abs: 4.2 10*3/uL (ref 1.7–7.7)
Neutrophils Relative %: 70 %
Platelets: 286 10*3/uL (ref 150–400)
RBC: 4.25 MIL/uL (ref 3.87–5.11)
RDW: 14.7 % (ref 11.5–15.5)
WBC: 6 10*3/uL (ref 4.0–10.5)
nRBC: 0 % (ref 0.0–0.2)

## 2022-07-13 LAB — COMPREHENSIVE METABOLIC PANEL
ALT: 14 U/L (ref 0–44)
AST: 18 U/L (ref 15–41)
Albumin: 4.3 g/dL (ref 3.5–5.0)
Alkaline Phosphatase: 66 U/L (ref 38–126)
Anion gap: 10 (ref 5–15)
BUN: 18 mg/dL (ref 8–23)
CO2: 25 mmol/L (ref 22–32)
Calcium: 9.7 mg/dL (ref 8.9–10.3)
Chloride: 106 mmol/L (ref 98–111)
Creatinine, Ser: 0.77 mg/dL (ref 0.44–1.00)
GFR, Estimated: >60 mL/min (ref >60)
Glucose, Bld: 168 mg/dL — ABNORMAL HIGH (ref 70–99)
Potassium: 4 mmol/L (ref 3.5–5.1)
Sodium: 141 mmol/L (ref 135–145)
Total Bilirubin: 0.3 mg/dL (ref 0.3–1.2)
Total Protein: 8 g/dL (ref 6.5–8.1)

## 2022-07-13 MED ORDER — CEFAZOLIN SODIUM-DEXTROSE 2-4 GM/100ML-% IV SOLN
2.0000 g | INTRAVENOUS | Status: AC
  Administered 2022-07-14: 2 g via INTRAVENOUS

## 2022-07-13 MED ORDER — SODIUM CHLORIDE 0.9 % IV SOLN: INTRAVENOUS | Status: DC

## 2022-07-13 MED ORDER — ORAL CARE MOUTH RINSE: 15.0000 mL | Freq: Once | OROMUCOSAL | Status: AC

## 2022-07-13 MED ORDER — CHLORHEXIDINE GLUCONATE 0.12 % MT SOLN: 15.0000 mL | Freq: Once | OROMUCOSAL | Status: AC

## 2022-07-14 ENCOUNTER — Ambulatory Visit: Payer: Medicare PPO | Admitting: Urgent Care

## 2022-07-14 ENCOUNTER — Other Ambulatory Visit: Payer: Self-pay

## 2022-07-14 ENCOUNTER — Encounter: Payer: Self-pay | Admitting: Surgery

## 2022-07-14 ENCOUNTER — Encounter
Admission: RE | Disposition: A | Discharge status: Home / Self Care | Payer: Self-pay | Source: Home / Self Care | Attending: Surgery

## 2022-07-14 ENCOUNTER — Ambulatory Visit
Admission: RE | Admit: 2022-07-14 | Discharge: 2022-07-14 | Disposition: A | Discharge status: Home / Self Care | Payer: Medicare PPO | Attending: Surgery | Admitting: Surgery

## 2022-07-14 ENCOUNTER — Telehealth: Payer: Self-pay | Admitting: Internal Medicine

## 2022-07-14 ENCOUNTER — Ambulatory Visit: Payer: Medicare PPO

## 2022-07-14 DIAGNOSIS — R42 Dizziness and giddiness: Secondary | ICD-10-CM

## 2022-07-14 DIAGNOSIS — Z85038 Personal history of other malignant neoplasm of large intestine: Secondary | ICD-10-CM | POA: Diagnosis not present

## 2022-07-14 DIAGNOSIS — M1812 Unilateral primary osteoarthritis of first carpometacarpal joint, left hand: Secondary | ICD-10-CM | POA: Diagnosis not present

## 2022-07-14 DIAGNOSIS — Z8542 Personal history of malignant neoplasm of other parts of uterus: Secondary | ICD-10-CM | POA: Diagnosis not present

## 2022-07-14 DIAGNOSIS — K219 Gastro-esophageal reflux disease without esophagitis: Secondary | ICD-10-CM | POA: Diagnosis not present

## 2022-07-14 DIAGNOSIS — Z01812 Encounter for preprocedural laboratory examination: Secondary | ICD-10-CM

## 2022-07-14 DIAGNOSIS — E1165 Type 2 diabetes mellitus with hyperglycemia: Secondary | ICD-10-CM | POA: Diagnosis not present

## 2022-07-14 DIAGNOSIS — J449 Chronic obstructive pulmonary disease, unspecified: Secondary | ICD-10-CM | POA: Diagnosis not present

## 2022-07-14 DIAGNOSIS — I10 Essential (primary) hypertension: Secondary | ICD-10-CM | POA: Insufficient documentation

## 2022-07-14 DIAGNOSIS — E785 Hyperlipidemia, unspecified: Secondary | ICD-10-CM | POA: Insufficient documentation

## 2022-07-14 DIAGNOSIS — G5602 Carpal tunnel syndrome, left upper limb: Secondary | ICD-10-CM | POA: Diagnosis not present

## 2022-07-14 HISTORY — PX: CARPOMETACARPAL (CMC) FUSION OF THUMB: SHX6290

## 2022-07-14 LAB — GLUCOSE, CAPILLARY
Glucose-Capillary: 93 mg/dL (ref 70–99)
Glucose-Capillary: 126 mg/dL — ABNORMAL HIGH (ref 70–99)

## 2022-07-14 LAB — CEA: CEA: 6.2 ng/mL — ABNORMAL HIGH (ref 0.0–4.7)

## 2022-07-14 SURGERY — CARPOMETACARPAL (CMC) FUSION OF THUMB
Anesthesia: General | Site: Thumb | Laterality: Left

## 2022-07-14 MED ORDER — FENTANYL CITRATE (PF) 100 MCG/2ML IJ SOLN
INTRAMUSCULAR | Status: AC
  Filled 2022-07-14: qty 2

## 2022-07-14 MED ORDER — FENTANYL CITRATE (PF) 100 MCG/2ML IJ SOLN
INTRAMUSCULAR | Status: DC | PRN
  Administered 2022-07-14 (×4): 50 ug via INTRAVENOUS

## 2022-07-14 MED ORDER — MIDAZOLAM HCL 2 MG/2ML IJ SOLN
INTRAMUSCULAR | Status: DC | PRN
  Administered 2022-07-14 (×2): 1 mg via INTRAVENOUS

## 2022-07-14 MED ORDER — MECLIZINE HCL 25 MG PO TABS: 25.0000 mg | ORAL_TABLET | Freq: Three times a day (TID) | ORAL | Status: DC | PRN

## 2022-07-14 MED ORDER — BUPIVACAINE HCL (PF) 0.5 % IJ SOLN
INTRAMUSCULAR | Status: DC | PRN
  Administered 2022-07-14: 20 mL

## 2022-07-14 MED ORDER — FENTANYL CITRATE (PF) 100 MCG/2ML IJ SOLN: 25.0000 ug | INTRAMUSCULAR | Status: DC | PRN

## 2022-07-14 MED ORDER — CHLORHEXIDINE GLUCONATE 0.12 % MT SOLN
OROMUCOSAL | Status: AC
  Administered 2022-07-14: 15 mL via OROMUCOSAL
  Filled 2022-07-14: qty 15

## 2022-07-14 MED ORDER — ONDANSETRON HCL 4 MG/2ML IJ SOLN
INTRAMUSCULAR | Status: AC
  Administered 2022-07-14: 4 mg via INTRAVENOUS
  Filled 2022-07-14: qty 2

## 2022-07-14 MED ORDER — ONDANSETRON HCL 4 MG/2ML IJ SOLN
INTRAMUSCULAR | Status: DC | PRN
  Administered 2022-07-14: 4 mg via INTRAVENOUS

## 2022-07-14 MED ORDER — KETAMINE HCL 50 MG/5ML IJ SOSY
PREFILLED_SYRINGE | INTRAMUSCULAR | Status: AC
  Filled 2022-07-14: qty 5

## 2022-07-14 MED ORDER — ACETAMINOPHEN 10 MG/ML IV SOLN
INTRAVENOUS | Status: DC | PRN
  Administered 2022-07-14: 1000 mg via INTRAVENOUS

## 2022-07-14 MED ORDER — OXYCODONE HCL 5 MG/5ML PO SOLN: 5.0000 mg | Freq: Once | ORAL | Status: DC | PRN

## 2022-07-14 MED ORDER — 0.9 % SODIUM CHLORIDE (POUR BTL) OPTIME
TOPICAL | Status: DC | PRN
  Administered 2022-07-14: 500 mL

## 2022-07-14 MED ORDER — LIDOCAINE HCL (CARDIAC) PF 100 MG/5ML IV SOSY
PREFILLED_SYRINGE | INTRAVENOUS | Status: DC | PRN
  Administered 2022-07-14: 50 mg via INTRAVENOUS

## 2022-07-14 MED ORDER — ONDANSETRON HCL 4 MG/2ML IJ SOLN: 4.0000 mg | Freq: Once | INTRAMUSCULAR | Status: AC

## 2022-07-14 MED ORDER — PROPOFOL 10 MG/ML IV BOLUS
INTRAVENOUS | Status: DC | PRN
  Administered 2022-07-14: 150 mg via INTRAVENOUS

## 2022-07-14 MED ORDER — GLYCOPYRROLATE 0.2 MG/ML IJ SOLN
INTRAMUSCULAR | Status: DC | PRN
  Administered 2022-07-14: .2 mg via INTRAVENOUS

## 2022-07-14 MED ORDER — MIDAZOLAM HCL 2 MG/2ML IJ SOLN
INTRAMUSCULAR | Status: AC
  Filled 2022-07-14: qty 2

## 2022-07-14 MED ORDER — ACETAMINOPHEN 10 MG/ML IV SOLN
INTRAVENOUS | Status: AC
  Filled 2022-07-14: qty 300

## 2022-07-14 MED ORDER — DEXAMETHASONE SODIUM PHOSPHATE 10 MG/ML IJ SOLN
INTRAMUSCULAR | Status: DC | PRN
  Administered 2022-07-14: 5 mg via INTRAVENOUS

## 2022-07-14 MED ORDER — CEFAZOLIN SODIUM-DEXTROSE 2-4 GM/100ML-% IV SOLN
INTRAVENOUS | Status: AC
  Filled 2022-07-14: qty 100

## 2022-07-14 MED ORDER — HYDROCODONE-ACETAMINOPHEN 5-325 MG PO TABS: 1.0000 | ORAL_TABLET | Freq: Four times a day (QID) | ORAL | 0 refills | Status: DC | PRN

## 2022-07-14 MED ORDER — PHENYLEPHRINE 80 MCG/ML (10ML) SYRINGE FOR IV PUSH (FOR BLOOD PRESSURE SUPPORT)
PREFILLED_SYRINGE | INTRAVENOUS | Status: DC | PRN
  Administered 2022-07-14 (×2): 80 ug via INTRAVENOUS

## 2022-07-14 MED ORDER — OXYCODONE HCL 5 MG PO TABS: 5.0000 mg | ORAL_TABLET | Freq: Once | ORAL | Status: DC | PRN

## 2022-07-14 MED ORDER — BUPIVACAINE HCL (PF) 0.5 % IJ SOLN
INTRAMUSCULAR | Status: AC
  Filled 2022-07-14: qty 30

## 2022-07-14 MED ORDER — DEXMEDETOMIDINE (PRECEDEX) IN NS 20 MCG/5ML (4 MCG/ML) IV SYRINGE
PREFILLED_SYRINGE | INTRAVENOUS | Status: DC | PRN
  Administered 2022-07-14: 8 ug via INTRAVENOUS
  Administered 2022-07-14: 4 ug via INTRAVENOUS
  Administered 2022-07-14: 8 ug via INTRAVENOUS

## 2022-07-14 MED ORDER — PROPOFOL 1000 MG/100ML IV EMUL
INTRAVENOUS | Status: AC
  Filled 2022-07-14: qty 100

## 2022-07-14 MED ORDER — KETAMINE HCL 10 MG/ML IJ SOLN
INTRAMUSCULAR | Status: DC | PRN
  Administered 2022-07-14: 20 mg via INTRAVENOUS
  Administered 2022-07-14: 30 mg via INTRAVENOUS

## 2022-07-14 SURGICAL SUPPLY — 47 items
ANCHOR FIBERLOCK SUSPENSION (Anchor) ×1 IMPLANT
APL PRP STRL LF DISP 70% ISPRP (MISCELLANEOUS) ×1
BLADE DEBAKEY 8.0 (BLADE) ×2 IMPLANT
BLADE OSC/SAGITTAL 5.5X25 (BLADE) ×2 IMPLANT
BLADE SURG 15 STRL LF DISP TIS (BLADE) ×2 IMPLANT
BLADE SURG 15 STRL SS (BLADE) ×4
BNDG CMPR 5X4 CHSV STRCH STRL (GAUZE/BANDAGES/DRESSINGS)
BNDG COHESIVE 4X5 TAN STRL LF (GAUZE/BANDAGES/DRESSINGS) ×1 IMPLANT
BNDG ELASTIC 3X5.8 VLCR STR LF (GAUZE/BANDAGES/DRESSINGS) ×2 IMPLANT
BNDG ESMARK 4X12 TAN STRL LF (GAUZE/BANDAGES/DRESSINGS) ×2 IMPLANT
BNDG GZE 12X3 1 PLY HI ABS (GAUZE/BANDAGES/DRESSINGS) ×1
BNDG STRETCH GAUZE 3IN X12FT (GAUZE/BANDAGES/DRESSINGS) ×2 IMPLANT
BUR 4X55 1 (BURR) ×1 IMPLANT
CAST PADDING 3X4FT ST 30246 (SOFTGOODS) ×1
CHLORAPREP W/TINT 26 (MISCELLANEOUS) ×2 IMPLANT
CUFF TOURN SGL QUICK 18X4 (TOURNIQUET CUFF) ×2 IMPLANT
DRAPE FLUOR MINI C-ARM 54X84 (DRAPES) ×2 IMPLANT
FORCEPS JEWEL BIP 4-3/4 STR (INSTRUMENTS) ×2 IMPLANT
GAUZE XEROFORM 1X8 LF (GAUZE/BANDAGES/DRESSINGS) ×2 IMPLANT
GLOVE BIO SURGEON STRL SZ8 (GLOVE) ×4 IMPLANT
GLOVE SURG UNDER LTX SZ8 (GLOVE) ×2 IMPLANT
GOWN STRL REUS W/ TWL XL LVL3 (GOWN DISPOSABLE) ×1 IMPLANT
GOWN STRL REUS W/TWL XL LVL3 (GOWN DISPOSABLE) ×2
KIT TURNOVER KIT A (KITS) ×2 IMPLANT
MANIFOLD NEPTUNE II (INSTRUMENTS) ×2 IMPLANT
NS IRRIG 500ML POUR BTL (IV SOLUTION) ×2 IMPLANT
PACK EXTREMITY ARMC (MISCELLANEOUS) ×2 IMPLANT
PAD CAST CTTN 3X4 STRL (SOFTGOODS) ×2 IMPLANT
PADDING CAST COTTON 3X4 STRL (SOFTGOODS) ×1
PASSER SUT SWANSON 36MM LOOP (INSTRUMENTS) IMPLANT
SPLINT CAST 1 STEP 3X12 (MISCELLANEOUS) ×2 IMPLANT
STOCKINETTE 48X4 2 PLY STRL (GAUZE/BANDAGES/DRESSINGS) ×1 IMPLANT
STOCKINETTE IMPERVIOUS 9X36 MD (GAUZE/BANDAGES/DRESSINGS) ×2 IMPLANT
STOCKINETTE STRL 4IN 9604848 (GAUZE/BANDAGES/DRESSINGS) ×2 IMPLANT
STRIP CLOSURE SKIN 1/2X4 (GAUZE/BANDAGES/DRESSINGS) ×1 IMPLANT
STRIP CLOSURE SKIN 1/4X4 (GAUZE/BANDAGES/DRESSINGS) ×1 IMPLANT
SUT ETHIBOND 0 MO6 C/R (SUTURE) ×1 IMPLANT
SUT PROLENE 4 0 PS 2 18 (SUTURE) ×2 IMPLANT
SUT VIC AB 2-0 CT2 27 (SUTURE) ×2 IMPLANT
SUT VIC AB 2-0 SH 27 (SUTURE) ×2
SUT VIC AB 2-0 SH 27XBRD (SUTURE) ×1 IMPLANT
SUT VIC AB 3-0 SH 27 (SUTURE)
SUT VIC AB 3-0 SH 27X BRD (SUTURE) ×1 IMPLANT
SWABSTK COMLB BENZOIN TINCTURE (MISCELLANEOUS) ×1 IMPLANT
TRAP FLUID SMOKE EVACUATOR (MISCELLANEOUS) ×1 IMPLANT
WATER STERILE IRR 500ML POUR (IV SOLUTION) ×2 IMPLANT
WIRE Z .062 C-WIRE SPADE TIP (WIRE) IMPLANT

## 2022-07-14 NOTE — Telephone Encounter (Signed)
Called patient to reschedule appointment to video visit with Dr. B. Need to verify video capabilities.

## 2022-07-14 NOTE — Anesthesia Postprocedure Evaluation (Signed)
Anesthesia Post Note  Patient: Cindy Griffin  Procedure(s) Performed: LEFT THUMB SUSPENSION CMC ARTHROPLASTY, AND  ENDOSCOPIC LEFT CARPAL TUNNEL RELEASE (Left: Thumb)  Patient location during evaluation: PACU Anesthesia Type: General Level of consciousness: awake and alert Pain management: pain level controlled Vital Signs Assessment: post-procedure vital signs reviewed and stable Respiratory status: spontaneous breathing, nonlabored ventilation, respiratory function stable and patient connected to nasal cannula oxygen Cardiovascular status: blood pressure returned to baseline and stable Postop Assessment: no apparent nausea or vomiting Anesthetic complications: no   No notable events documented.   Last Vitals:  Vitals:   07/14/22 0955 07/14/22 1043  BP: (!) 165/86 (!) 160/81  Pulse: 68 72  Resp: 17 15  Temp: (!) 36.1 C   SpO2: 95% 98%    Last Pain:  Vitals:   07/14/22 1043  TempSrc:   PainSc: 0-No pain                 Dimas Millin

## 2022-07-14 NOTE — Op Note (Addendum)
07/14/2022  11:03 AM  Patient:   Cindy Griffin  Pre-Op Diagnosis:   1.  Left carpal tunnel syndrome.  2.  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  Post-Op Diagnosis:   Same.  Procedure:   1.  Endoscopic left carpal tunnel release.  2.  Suspension arthroplasty left thumb CMC joint.  Surgeon:   Pascal Lux, MD  Anesthesia:   General LMA  Findings:   As above.  Complications:   None  EBL:   0 cc  Fluids:   600 cc crystalloid  TT:   70 minutes at 250 mmHg  Drains:   None  Closure:   4-0 Prolene interrupted sutures  Implants:   Arthrex FiberLock suspension device  Brief Clinical Note:   The patient is a 76 year old female with a history of progressive worsening basilar left thumb pain. Her symptoms have progressed despite medications, activity modification, etc. Her history examination consistent with degenerative joint disease of the left thumb CMC joint, confirmed by plain radiographs. She presents at this time for a suspension arthroplasty of her left thumb CMC joint.   The patient also complains of chronic pain and paresthesias to her left hand. The symptoms have persisted despite medications, activity modification, etc. Her history and examination also are consistent with left carpal tunnel syndrome. The patient presents at this time for an endoscopic left carpal tunnel release.   Procedure:   The patient was brought into the operating room and lain in the supine position. After adequate general laryngeal mask anesthesia was obtained, the left hand and upper extremity were prepped with ChloraPrep solution before being draped sterilely. Preoperative antibiotics were administered. A timeout was performed to verify the appropriate surgical site before the limb was exsanguinated with an Esmarch and the tourniquet inflated to 250 mmHg.   The carpal tunnel was addressed first. An approximately 1.5-2 cm incision was made over the volar wrist flexion crease, centered  over the palmaris longus tendon. The incision was carried down through the subcutaneous tissues with care taken to identify and protect any neurovascular structures. The distal forearm fascia was penetrated just proximal to the transverse carpal ligament. The soft tissues were released off the superficial and deep surfaces of the distal forearm fascia and this was released proximally for 3-4 cm under direct visualization.  Attention was directed distally. The Soil scientist was passed beneath the transverse carpal ligament along the ulnar aspect of the carpal tunnel and used to release any adhesions as well as to remove any adherent synovial tissue before first the smaller then the larger of the two dilators were passed beneath the transverse carpal ligament along the ulnar margin of the carpal tunnel. The slotted cannula was introduced and the endoscope was placed into the slotted cannula and the undersurface of the transverse carpal ligament visualized. The distal margin of the transverse carpal ligament was marked by placing a 25-gauge needle percutaneously at [place] cardinal point so that it entered the distal portion of the slotted cannula. Under endoscopic visualization, the transverse carpal ligament was released from proximal to distal using the end-cutting blade. A second pass was performed to ensure complete release of the ligament. The adequacy of release was verified both endoscopically and by palpation using the freer elevator.  The wound was irrigated thoroughly with sterile saline solution before being closed using 4-0 Prolene interrupted sutures.   Next, the left thumb CMC joint was addressed. An approximately 4-5 cm longitudinal incision was made centered over the radial  aspect of the thumb CMC joint. The incision was carried down through the subcutaneous cutaneous tissues with care taken to identify and protect the local neurovascular structures. Several small veins were cauterized with  bipolar electrocautery. A longitudinal incision was made through the capsular tissues over the dorsal aspect of the thumb CMC joint. Subperiosteal dissection was carried out to expose the trapezium dorsally and volarly. Once the [facility] and scaphotrapezial joints were identified, a sagittal cut was made in the trapezium using the micro-oscillating saw. This cut extended approximately two-thirds down through the bone. A Hoke osteotome was used to complete transection of the bone. The bone was then removed piecemeal using rongeurs and further dissection. The flexor carpi radialis tendon was identified at the base of the defect. The base of the thumb metacarpal was prepared by exposing its radial base.  Under fluoroscopic visualization, a guidewire was passed through the base of the index metacarpal from radial to ulnar. After verifying its position fluoroscopically, the threaded introducer was secured over the top of it and advanced appropriately. The guidewire was removed and the appropriate size drill was passed through the base of the index metacarpal bicortically. The Arthrex FiberLock suspension device was then inserted through the base of the index metacarpal. Under fluoroscopic visualization, the introducer was advanced through the ulnar cortex before the device was deployed. Traction was pulled on the sutures to set the suture anchor before removing the introducer.    A guidewire was placed from the dorsal radial surface into the base of the thumb metacarpal, then overdrilled with the smaller drill. The 3.5 mm anchor was inserted with the 2 ends of the fiber tape and advanced fully to secure the sutures into the bone. Mild tension was held on the thumb to optimize thumb position. The adequacy of the reduction of the base of the thumb to the base of the index metacarpal was verified in AP, lateral, and oblique views and found to be excellent.  The wound was copiously irrigated with sterile saline solution  using bulb irrigation. The thumb CMC capsular tissues were reapproximated using 3-0 Vicryl interrupted sutures before the skin was closed using 3-0 Vicryl subcuticular interrupted sutures. Benzoin and Steri-Strips were applied to the skin. A total of 20 cc of 0.5% plain Sensorcaine was injected in and around both incisions to help with postoperative analgesia. A sterile bulky dressing was applied to the wounds before the patient was placed into a fiberglass thumb spica splint maintaining the thumb in the position of function. The patient was then awakened, extubated, and returned to the recovery room in satisfactory condition after tolerating the procedure well.

## 2022-07-14 NOTE — Anesthesia Procedure Notes (Signed)
Procedure Name: LMA Insertion Date/Time: 07/14/2022 7:35 AM  Performed by: Biagio Borg, CRNAPre-anesthesia Checklist: Patient identified, Emergency Drugs available, Suction available and Patient being monitored Patient Re-evaluated:Patient Re-evaluated prior to induction Oxygen Delivery Method: Circle system utilized Preoxygenation: Pre-oxygenation with 100% oxygen Induction Type: IV induction Ventilation: Mask ventilation without difficulty LMA: LMA inserted LMA Size: 4.0 Tube type: Oral Number of attempts: 1 Placement Confirmation: positive ETCO2 and breath sounds checked- equal and bilateral Tube secured with: Tape Dental Injury: Teeth and Oropharynx as per pre-operative assessment

## 2022-07-14 NOTE — H&P (Signed)
History of Present Illness: Cindy Griffin is a 76 y.o. female who presents today for her surgical history and physical for upcoming left thumb CMC arthroplasty. The patient is scheduled Dr. Roland Rack on 07/14/2022. The patient denies any changes in her medical history since she was last evaluated. The patient denies any trauma or injury affecting the left hand since she was last evaluated. She denies any personal history of heart attack, stroke, asthma or COPD. No personal history of blood clots. The patient does report burning and tingling in the left hand as well, the patient does have a history of left carpal tunnel syndrome and has undergone left carpal tunnel syndrome steroid injections in the past with moderate relief. The patient is interested in undergoing a endoscopic carpal tunnel release at the same time of her [facility] arthroplasty. The patient reports a moderate amount of pain in the left thumb, she is wearing her thumb spica Velcro wrist splint for support of the left hand. She does have moderate increase in discomfort when attempting to lift objects with the left hand as well.  Past Medical History: Abnormal EKG  Allergy  Asthma  Chronic back pain  Diabetes mellitus (CMS-HCC) x7 years  GERD (gastroesophageal reflux disease)  Hyperlipidemia  Hypertension  OA (osteoarthritis)  Tubular adenoma of colon 04/11/2015   Past Surgical History: Cervical Fusion by Dr. Rolin Barry in [place] 04/11/2015 (Tubular adenoma of colon/Repeat 64yrs/PYO)  Extensive arthroscopic debridement, arthroscopic SLAP repair, arthroscopic subacromial decompression, arthroscopic distal clavicle excision, mini-open rotator cuff repair, and mini-open biceps tenodesis, left shoulder. Left 05/18/2016 (Dr. Roland Rack)   Past Family History: Diabetes Mother  Kidney failure Mother  Diabetes Brother  Irregular Heart Beat (Arrhythmia) Brother  Myocardial Infarction (Heart attack) Brother  Diabetes Sister  Irregular  Heart Beat (Arrhythmia) Sister  Myocardial Infarction (Heart attack) Brother  Myocardial Infarction (Heart attack) Brother   Medications: ACCU-CHEK NANO Misc  acetaminophen (TYLENOL) 500 MG tablet Take 1,000 mg by mouth every 6 (six) hours as needed for Pain  albuterol 90 mcg/actuation inhaler Inhale 2 inhalations into the lungs every 6 (six) hours as needed  ascorbic acid, vitamin C, (VITAMIN C) 1000 MG tablet Take 1,000 mg by mouth once daily  aspirin 81 MG EC tablet Take 81 mg by mouth once daily.  atorvastatin (LIPITOR) 80 MG tablet Take 80 mg by mouth once daily.  BD ULTRA-FINE MICRO PEN NEEDLE 32 gauge x 1/4" needle as directed  BEVESPI AEROSPHERE 9-4.8 mcg inhaler Inhale 2 inhalations into the lungs nightly  biotin 5 mg capsule Take 5 mg by mouth once daily  carboxymethylcellulose-glycerin (REFRESH OPTIVE) 0.5-0.9 % ophthalmic solution Place 2 drops into both eyes 2 (two) times daily as needed  carvediloL (COREG) 6.25 MG tablet Take 1 tablet (6.25 mg total) by mouth 2 (two) times daily with meals 180 tablet 1  cetirizine (ZYRTEC) 10 MG tablet Take 10 mg by mouth once daily.  cholecalciferol (VITAMIN D3) 1,000 unit capsule Take 1,000 Units by mouth once daily.  cyanocobalamin (VITAMIN B12) 500 MCG tablet Take 500 mcg by mouth once daily  dulaglutide (TRULICITY) 3 TZ/0.0 mL subcutaneous pen injector Inject 3 mg as directed once a week. Patient receives through [employer] Assistance through Dec 2023  famotidine (PEPCID) 20 MG tablet Take 20 mg by mouth 2 (two) times daily as needed  fluticasone (FLONASE) 50 mcg/actuation nasal spray Place 1 spray into both nostrils once daily.  FUROsemide (LASIX) 20 MG tablet Take 1 tablet (20 mg total) by  mouth once daily 30 tablet 11  lisinopril (PRINIVIL,ZESTRIL) 40 MG tablet Take 40 mg by mouth once daily  meclizine (ANTIVERT) 25 mg tablet Take 25 mg by mouth every 6 (six) hours as needed  metFORMIN (GLUCOPHAGE-XR) 750 MG XR tablet Take  1,500 mg by mouth once daily  multivitamin with minerals tablet Take 1 tablet by mouth once daily  omega-3s-dha-epa-algal oil 330-300-600 mg Cap Take 660 mg by mouth once daily  pregabalin (LYRICA) 75 MG capsule Take 75 mg by mouth 2 (two) times daily  tiZANidine (ZANAFLEX) 2 MG tablet 1 po tid prn 270 tablet 3  traMADoL (ULTRAM) 50 mg tablet TAKE 1/2 TO 1 (ONE-HALF TO ONE) TABLET BY MOUTH TWICE DAILY AS NEEDED FOR NON ACUTE PAIN 60 tablet 2  traZODone (DESYREL) 50 MG tablet Take 50 mg by mouth nightly as needed.   Allergies: Doxycycline Rash and Itching   Review of Systems:  A comprehensive 14 point ROS was performed, reviewed by me today, and the pertinent orthopaedic findings are documented in the HPI.  Physical Exam: BP 122/78 (BP Location: Left upper arm, Patient Position: Sitting, BP Cuff Size: Large Adult)  Ht 162.6 cm (5\' 4" )  Wt 100.5 kg (221 lb 9.6 oz)  LMP (LMP Unknown)  BMI 38.04 kg/m  General/Constitutional: The patient appears to be well-nourished, well-developed, and in no acute distress. Neuro/Psych: Normal mood and affect, oriented to person, place and time. Eyes: Non-icteric. Pupils are equal, round, and reactive to light, and exhibit synchronous movement. ENT: Unremarkable. Lymphatic: No palpable adenopathy. Respiratory: Lungs clear to auscultation, Normal chest excursion, No wheezes, and Non-labored breathing Cardiovascular: Regular rate and rhythm. No murmurs. and No edema, swelling or tenderness, except as noted in detailed exam. Integumentary: No impressive skin lesions present, except as noted in detailed exam. Musculoskeletal: Unremarkable, except as noted in detailed exam.  General: Well developed, well nourished 76 y.o. female in no apparent distress. Normal affect. Normal communication. Patient answers questions appropriately. The patient has a normal gait. There is no antalgic component. There is no hip lurch.   Left wrist/hand exam: Skin examination of  the left hand demonstrates no open wound, erythema. There is some mild swelling noted at the base of the left thumb. Moderate tenderness to palpation directly over the [facility] joint of the left thumb at today's visit. Increased pain with any active motion such as flexion, extension, adduction and abduction. The patient is able to make a full composite fist with moderate discomfort. She is intact light touch throughout left upper extremity. Cap refills intact each individual digit. Radial pulse is intact to the left wrist. The patient does have a positive grind test the left thumb. The patient does have a positive Tinel's test the left upper extremity. The patient does have a positive carpal tunnel compression test at today's visit as well.  Neurological: The patient has sensation that is intact to light touch and pinprick bilaterally. The patient has normal grip strength. The patient has full biceps, wrist extension, grip, and interosseous strength. The patient has 2 + DTRs bilaterally.  Vascular: The patient has less than 2 second capillary refill. The patient has normal ulnar and radial pulses. The patient has normal warmth to touch.   Imaging: AP, lateral and oblique images of the left hand were obtained previously and reviewed today. These x-rays demonstrate evidence of moderate osteoarthritic changes involving the left thumb CMC joint with decreased joint space and subluxation. There is some underlying sclerotic changes at the base of the  metacarpal. Small osteophyte formations also identified. No acute fracture or lytic lesion is identified.  Impression: 1. Left thumb CMC arthritis. 2. Left carpal tunnel syndrome.  Plan:  1. Treatment options were discussed today with the patient. 2. The patient is scheduled for a left thumb [facility] arthroplasty scheduled Dr. Roland Rack on 07/14/2022. The patient is experiencing ongoing carpal tunnel symptoms as well, she would like to undergo the endoscopic carpal tunnel  release at the same time as her left thumb surgery as well. 3. The patient was instructed on the risk and benefits of surgery and wishes to proceed at this time. This document will serve as a surgical history and physical for the patient. 4. The patient will follow-up per standard postop protocol. She can contact the clinic if she has any questions, new symptoms develop or symptoms worsen.  The procedure was discussed with the patient, as were the potential risks (including bleeding, infection, nerve and/or blood vessel injury, persistent or recurrent pain, failure of the repair, progression of arthritis, need for further surgery, blood clots, strokes, heart attacks and/or arhythmias, pneumonia, etc.) and benefits. The patient states her understanding and wishes to proceed.   H&P reviewed and patient re-examined. No changes.
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
## 2022-07-14 NOTE — Transfer of Care (Signed)
Immediate Anesthesia Transfer of Care Note  Patient: Cindy Griffin  Procedure(s) Performed: LEFT THUMB SUSPENSION CMC ARTHROPLASTY, AND  ENDOSCOPIC LEFT CARPAL TUNNEL RELEASE (Left: Thumb)  Patient Location: PACU  Anesthesia Type:General  Level of Consciousness: drowsy  Airway & Oxygen Therapy: Patient Spontanous Breathing and Patient connected to face mask oxygen  Post-op Assessment: Report given to RN and Post -op Vital signs reviewed and stable  Post vital signs: Reviewed and stable  Last Vitals:  Vitals Value Taken Time  BP 125/88 07/14/22 0907  Temp 36 C 07/14/22 0907  Pulse 83 07/14/22 0910  Resp 20 07/14/22 0910  SpO2 97 % 07/14/22 0910    Last Pain:  Vitals:   07/14/22 0642  TempSrc: Temporal  PainSc: 10-Worst pain ever         Complications: No notable events documented.

## 2022-07-14 NOTE — Telephone Encounter (Signed)
Patient called requesting that her appointment on 8/17 be a virtual visit due to transportation issues.   Please advise.

## 2022-07-14 NOTE — Discharge Instructions (Addendum)
Orthopedic discharge instructions: Keep splint dry and intact. Keep hand elevated above heart level. Apply ice to affected area frequently. Take pain medication as prescribed or ES Tylenol when needed.  Return for follow-up in 10-14 days or as scheduled.  AMBULATORY SURGERY  DISCHARGE INSTRUCTIONS   The drugs that you were given will stay in your system until tomorrow so for the next 24 hours you should not:  Drive an automobile Make any legal decisions Drink any alcoholic beverage   You may resume regular meals tomorrow.  Today it is better to start with liquids and gradually work up to solid foods.  You may eat anything you prefer, but it is better to start with liquids, then soup and crackers, and gradually work up to solid foods.   Please notify your doctor immediately if you have any unusual bleeding, trouble breathing, redness and pain at the surgery site, drainage, fever, or pain not relieved by medication.    Additional Instructions:   Please contact your physician with any problems or Same Day Surgery at [phone], Monday through Friday 6 am to 4 pm, or [place] at [facility] number at [phone].

## 2022-07-15 ENCOUNTER — Inpatient Hospital Stay: Payer: Medicare PPO | Admitting: Internal Medicine

## 2022-07-15 ENCOUNTER — Encounter: Payer: Self-pay | Admitting: Surgery

## 2022-07-15 ENCOUNTER — Inpatient Hospital Stay (HOSPITAL_BASED_OUTPATIENT_CLINIC_OR_DEPARTMENT_OTHER): Payer: Medicare PPO | Admitting: Internal Medicine

## 2022-07-15 DIAGNOSIS — C182 Malignant neoplasm of ascending colon: Secondary | ICD-10-CM | POA: Diagnosis not present

## 2022-07-15 NOTE — Assessment & Plan Note (Addendum)
#  Colon cancer-of ascending colon- stage I; MSI mismatch/sporadic; Excellent prognosis more than 90% chance of cure [see discussion below regarding elevated CEA]; January 2021-CT abdomen pelvis negative for any recurrence. STABLE; Follow-up EGD/colonoscopy-  LTR,3202 polyps.   #Abnormal CEA-around AUG 2023- 5-7 ; overall -STABLE;  Low clinical suspicion of any recurrent malignancy.  I again discussed the potential etiologies include autoimmune disease; liver disease; smoking etc.   # DISPOSITION:  # follow up in 6 months- MD: labs- cbc/cmp; CEA 2-3 days prior--Dr.B

## 2022-07-15 NOTE — Progress Notes (Signed)
I connected with Cindy Griffin on 07/15/22 at 10:00 AM EDT by telephone visit and verified that I am speaking with the correct person using two identifiers.  I discussed the limitations, risks, security and privacy concerns of performing an evaluation and management service by telemedicine and the availability of in-person appointments. I also discussed with the patient that there may be a patient responsible charge related to this service. The patient expressed understanding and agreed to proceed.    Other persons participating in the visit and their role in the encounter: RN/medical reconciliation Patient's location: home Provider's location: office  Oncology History Overview Note  #June 2019- [facility]- pT1pN 0/22 LN [Dr.white, [place]; screening; Dr. Vicente Males ]  # abnormal CEA  # MSI-HIGH; MLH-1 METHYLATION/sporadic  #Survivorship: p  DIAGNOSIS: [COLON CA  STAGE:  I       ;GOALS: cure  CURRENT/MOST RECENT THERAPY : surveIillaince    Colon cancer, ascending ([place])  05/25/2019 Initial Diagnosis   Colon cancer, ascending ([place])      Chief Complaint: Elevated tumor markers   History of present illness:Cindy Griffin 76 y.o.  female with history of stage colon cancer and also history of elevated/abnormal CEA is here for follow-up.  Patient had recent carpal tunnel surgery.  Complains of pain from surgery.  In the interim patient had a colonoscopy-February, 2023-noted to have polyps.  Denies any blood in stools or black or stools.  No nausea or vomiting.   Observation/objective: Alert & oriented x 3. In No acute distress.   Assessment and plan: Colon cancer, ascending ([place]) # Colon cancer-of ascending colon- stage I; MSI mismatch/sporadic; Excellent prognosis more than 90% chance of cure [see discussion below regarding elevated CEA]; January 2021-CT abdomen pelvis negative for any recurrence. STABLE; Follow-up EGD/colonoscopy-  BTD,9741 polyps.   #Abnormal CEA-around  AUG 2023- 5-7 ; overall -STABLE;  Low clinical suspicion of any recurrent malignancy.  I again discussed the potential etiologies include autoimmune disease; liver disease; smoking etc.   # DISPOSITION:  # follow up in 6 months- MD: labs- cbc/cmp; CEA 2-3 days prior--Dr.B      Follow-up instructions:  I discussed the assessment and treatment plan with the patient.  The patient was provided an opportunity to ask questions and all were answered.  The patient agreed with the plan and demonstrated understanding of instructions.  The patient was advised to call back or seek an in person evaluation if the symptoms worsen or if the condition fails to improve as anticipated.    Dr. Charlaine Dalton [place] at [facility] 07/15/2022 10:52 PM

## 2022-07-15 NOTE — Progress Notes (Signed)
Patient had surgery on left hand yesterday.  Having nausea and takes OTC ginger with relief.

## 2022-07-20 ENCOUNTER — Ambulatory Visit (INDEPENDENT_AMBULATORY_CARE_PROVIDER_SITE_OTHER): Payer: Medicare PPO

## 2022-07-20 VITALS — BP 120/62 | HR 86 | Temp 98.1°F | Resp 16 | Ht 64.0 in | Wt 221.2 lb

## 2022-07-20 DIAGNOSIS — Z0000 Encounter for general adult medical examination without abnormal findings: Secondary | ICD-10-CM
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
## 2022-07-26 DIAGNOSIS — M19042 Primary osteoarthritis, left hand: Secondary | ICD-10-CM | POA: Diagnosis not present

## 2022-07-31 ENCOUNTER — Other Ambulatory Visit: Payer: Self-pay | Admitting: Family Medicine

## 2022-07-31 DIAGNOSIS — J3089 Other allergic rhinitis: Secondary | ICD-10-CM

## 2022-08-03 ENCOUNTER — Other Ambulatory Visit: Payer: Self-pay

## 2022-08-03 DIAGNOSIS — J3089 Other allergic rhinitis: Secondary | ICD-10-CM

## 2022-08-13 ENCOUNTER — Other Ambulatory Visit: Payer: Self-pay | Admitting: Family Medicine

## 2022-08-13 DIAGNOSIS — Z1231 Encounter for screening mammogram for malignant neoplasm of breast: Secondary | ICD-10-CM

## 2022-08-19 ENCOUNTER — Other Ambulatory Visit: Payer: Self-pay | Admitting: Family Medicine

## 2022-08-27 DIAGNOSIS — M1812 Unilateral primary osteoarthritis of first carpometacarpal joint, left hand: Secondary | ICD-10-CM | POA: Insufficient documentation

## 2022-09-02 ENCOUNTER — Telehealth: Payer: Self-pay

## 2022-09-02 NOTE — Progress Notes (Signed)
Chronic Care Management Pharmacy Assistant   Name: Cindy Griffin  MRN: [id number] DOB: [birth year]   Reason for Encounter: Medication Review/Patient assistance renewal for Trulicity.    Recent office visits:  07/20/2022 Rexford Maus CMA (PCP Office) Medicare Wellness Completed, No medication Changes noted  Recent consult visits:  07/15/2022 Dr. Rogue Bussing MD (Oncology) N\o Medication Changes noted 06/30/2022 Damaris Hippo PA (Ortho Surgery) No medication Changes noted 06/07/2022 Sharlet Salina (Physical Medicine) Unable to see note  Hospital visits:  Medication Reconciliation was completed by comparing discharge summary, patient's EMR and Pharmacy list, and upon discussion with patient.  Admitted to the hospital on 07/14/2022 due to Pre-operative laboratory  examination. Discharge date was 07/14/2022. Discharged from [facility]?Medications Started at [facility] Discharge:?? -started None ID  Medication Changes at Hospital Discharge: -Changed None ID  Medications Discontinued at Hospital Discharge: -Stopped None ID  Medications that remain the same after Hospital Discharge:??  -All other medications will remain the same.    Medications: Outpatient Encounter Medications as of 09/02/2022  Medication Sig   acetaminophen (TYLENOL) 500 MG tablet Take 1,000 mg by mouth every 6 (six) hours as needed for moderate pain or headache.    albuterol (VENTOLIN HFA) 108 (90 Base) MCG/ACT inhaler INHALE 2 PUFFS INTO THE LUNGS EVERY 6 HOURS AS NEEDED FOR WHEEZING OR SHORTNESS OF BREATH.   Ascorbic Acid (VITAMIN C) 1000 MG tablet Take 1,000 mg by mouth daily.   aspirin EC 81 MG tablet Take 1 tablet (81 mg total) by mouth daily.   atorvastatin (LIPITOR) 80 MG tablet TAKE 1 TABLET EVERY DAY   BD PEN NEEDLE MICRO U/F 32G X 6 MM MISC USE ONCE DAILY   Carboxymethylcellul-Glycerin (LUBRICATING EYE DROPS OP) Place 2 drops into both eyes 2 (two) times daily as needed (for dry  eyes).   carvedilol (COREG) 6.25 MG tablet Take 6.25 mg by mouth daily.   cetirizine (ZYRTEC) 10 MG tablet Take 10 mg by mouth every other day as needed for allergies.    Cholecalciferol (VITAMIN D-3) 1000 units CAPS Take 1,000 Units by mouth daily.    Dulaglutide (TRULICITY) 3 YJ/8.5UD SOPN Inject 3 mg as directed once a week. Patient receives through [employer] Patient Assistance through Dec 2023   famotidine (PEPCID) 20 MG tablet TAKE 1 TABLET TWICE DAILY   fluticasone (FLONASE) 50 MCG/ACT nasal spray USE 2 SPRAYS IN EACH NOSTRIL EVERY DAY   furosemide (LASIX) 20 MG tablet TAKE 1 TABLET EVERY DAY   Ginger, Zingiber officinalis, (GINGER PO) Take by mouth. Patient reports taking OTC Ginger for nausea relief   Glycopyrrolate-Formoterol (BEVESPI AEROSPHERE) 9-4.8 MCG/ACT AERO INHALE 2 PUFFS BY MOUTH AT BEDTIME. Patient receives through [employer] Patient Assistance   HYDROcodone-acetaminophen (NORCO/VICODIN) 5-325 MG tablet Take 1-2 tablets by mouth every 6 (six) hours as needed for moderate pain or severe pain.   Lancets (ONETOUCH DELICA PLUS [id number]) MISC CHECK GLUCOSE THREE TIMES DAILY   lisinopril (ZESTRIL) 40 MG tablet TAKE 1 TABLET AT BEDTIME   meclizine (ANTIVERT) 25 MG tablet Take 1 tablet (25 mg total) by mouth 3 (three) times daily as needed.   metFORMIN (GLUCOPHAGE-XR) 750 MG 24 hr tablet TAKE 2 TABLETS (1,500 MG TOTAL) BY MOUTH DAILY WITH BREAKFAST.   Multiple Vitamins-Minerals (MULTIVITAMIN PO) Take 1 tablet by mouth daily.   Omega-3 1000 MG CAPS Take by mouth.   pioglitazone (ACTOS) 15 MG tablet TAKE 1 TABLET EVERY DAY   polyethylene glycol powder (GLYCOLAX/MIRALAX) 17 GM/SCOOP  powder Take 1 Container by mouth as needed. Taking 3 times a week   pregabalin (LYRICA) 75 MG capsule Take 1 capsule (75 mg total) by mouth 2 (two) times daily.   tiZANidine (ZANAFLEX) 2 MG tablet 1 po tid prn   traZODone (DESYREL) 50 MG tablet TAKE 1/2 TO 1 TABLET AT BEDTIME AS NEEDED FOR SLEEP   Turmeric 500  MG CAPS Take by mouth.   vitamin B-12 (CYANOCOBALAMIN) 500 MCG tablet Take 1,000 mcg by mouth daily.   No facility-administered encounter medications on file as of 09/02/2022.    Care Gaps: Foot Exam COVID-19 Vaccine Ophthalmology Exam Influenza Vaccine  Star Rating Drugs: Atorvastatin 80 mg last filled on 07/11/2022 for 90 day supply [facility] Lisinopril 40 mg last filled on 07/11/2022 for 90 day supply [facility] pharmacy Metformin 750 mg last filled on 07/11/2022 for 90 day supply [facility] Trulicity 3 mg - receives from [employer].   Medication Fill Gaps: None ID   Patient assistance renewal for Trulicity:  I received a task from Junius Argyle, CPP requesting that I start the renewal application for patient assistance on the medication Trulicity to continue assistance through year 2024.     Unable to speak  with the patient to inform her that the application will be mailed.Once she receives the application she will need to complete her part of the application and return it to her PCP office for Junius Argyle, CPP to fax over to the [facility] for processing. Patient should include a copy of her/his proof of income.     Application emailed to Junius Argyle, CPP for review and to mail to patient home.   Task made for the clinical pharmacist to submit a new prescription for Bevespi to [employer] for the patient to continue patient assistance for year 2024.  [place] Pharmacist Assistant [phone]

## 2022-09-06 ENCOUNTER — Encounter: Payer: Self-pay | Admitting: Occupational Therapy

## 2022-09-06 ENCOUNTER — Ambulatory Visit: Payer: Medicare PPO | Attending: Surgery | Admitting: Occupational Therapy

## 2022-09-06 DIAGNOSIS — M79642 Pain in left hand: Secondary | ICD-10-CM | POA: Diagnosis not present

## 2022-09-06 DIAGNOSIS — M25632 Stiffness of left wrist, not elsewhere classified: Secondary | ICD-10-CM | POA: Diagnosis not present

## 2022-09-06 DIAGNOSIS — M25642 Stiffness of left hand, not elsewhere classified: Secondary | ICD-10-CM | POA: Diagnosis not present

## 2022-09-06 DIAGNOSIS — M6281 Muscle weakness (generalized): Secondary | ICD-10-CM | POA: Insufficient documentation
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
## 2022-09-10 ENCOUNTER — Ambulatory Visit: Payer: Medicare PPO | Admitting: Occupational Therapy

## 2022-09-10 DIAGNOSIS — M79642 Pain in left hand: Secondary | ICD-10-CM

## 2022-09-10 DIAGNOSIS — M25632 Stiffness of left wrist, not elsewhere classified: Secondary | ICD-10-CM

## 2022-09-10 DIAGNOSIS — M6281 Muscle weakness (generalized): Secondary | ICD-10-CM | POA: Diagnosis not present

## 2022-09-10 DIAGNOSIS — M25642 Stiffness of left hand, not elsewhere classified: Secondary | ICD-10-CM | POA: Diagnosis not present
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
## 2022-09-15 ENCOUNTER — Ambulatory Visit
Admission: RE | Admit: 2022-09-15 | Discharge: 2022-09-15 | Disposition: A | Discharge status: Home / Self Care | Payer: Medicare PPO | Source: Ambulatory Visit | Attending: Family Medicine | Admitting: Family Medicine

## 2022-09-15 ENCOUNTER — Ambulatory Visit: Payer: Medicare PPO | Admitting: Occupational Therapy

## 2022-09-15 DIAGNOSIS — M6281 Muscle weakness (generalized): Secondary | ICD-10-CM

## 2022-09-15 DIAGNOSIS — M79642 Pain in left hand: Secondary | ICD-10-CM | POA: Diagnosis not present

## 2022-09-15 DIAGNOSIS — Z1231 Encounter for screening mammogram for malignant neoplasm of breast: Secondary | ICD-10-CM | POA: Diagnosis not present

## 2022-09-15 DIAGNOSIS — M25632 Stiffness of left wrist, not elsewhere classified: Secondary | ICD-10-CM

## 2022-09-15 DIAGNOSIS — M25642 Stiffness of left hand, not elsewhere classified: Secondary | ICD-10-CM
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
## 2022-09-17 ENCOUNTER — Encounter: Payer: Self-pay | Admitting: Family Medicine

## 2022-09-17 ENCOUNTER — Ambulatory Visit: Payer: Medicare PPO | Admitting: Occupational Therapy

## 2022-09-17 ENCOUNTER — Ambulatory Visit (INDEPENDENT_AMBULATORY_CARE_PROVIDER_SITE_OTHER): Payer: Medicare PPO | Admitting: Family Medicine

## 2022-09-17 VITALS — BP 124/72 | HR 89 | Resp 16 | Ht 64.0 in | Wt 222.0 lb

## 2022-09-17 DIAGNOSIS — Z85038 Personal history of other malignant neoplasm of large intestine: Secondary | ICD-10-CM

## 2022-09-17 DIAGNOSIS — E1159 Type 2 diabetes mellitus with other circulatory complications: Secondary | ICD-10-CM | POA: Insufficient documentation

## 2022-09-17 DIAGNOSIS — J411 Mucopurulent chronic bronchitis: Secondary | ICD-10-CM | POA: Diagnosis not present

## 2022-09-17 DIAGNOSIS — M25512 Pain in left shoulder: Secondary | ICD-10-CM | POA: Diagnosis not present

## 2022-09-17 DIAGNOSIS — I10 Essential (primary) hypertension: Secondary | ICD-10-CM

## 2022-09-17 DIAGNOSIS — F325 Major depressive disorder, single episode, in full remission: Secondary | ICD-10-CM | POA: Diagnosis not present

## 2022-09-17 DIAGNOSIS — E1169 Type 2 diabetes mellitus with other specified complication: Secondary | ICD-10-CM

## 2022-09-17 DIAGNOSIS — G8929 Other chronic pain: Secondary | ICD-10-CM

## 2022-09-17 DIAGNOSIS — I152 Hypertension secondary to endocrine disorders: Secondary | ICD-10-CM

## 2022-09-17 DIAGNOSIS — M6281 Muscle weakness (generalized): Secondary | ICD-10-CM | POA: Diagnosis not present

## 2022-09-17 DIAGNOSIS — M25642 Stiffness of left hand, not elsewhere classified: Secondary | ICD-10-CM

## 2022-09-17 DIAGNOSIS — E785 Hyperlipidemia, unspecified: Secondary | ICD-10-CM | POA: Diagnosis not present

## 2022-09-17 DIAGNOSIS — M25632 Stiffness of left wrist, not elsewhere classified: Secondary | ICD-10-CM | POA: Diagnosis not present

## 2022-09-17 DIAGNOSIS — D692 Other nonthrombocytopenic purpura: Secondary | ICD-10-CM

## 2022-09-17 DIAGNOSIS — M79642 Pain in left hand: Secondary | ICD-10-CM | POA: Diagnosis not present

## 2022-09-17 LAB — POCT GLYCOSYLATED HEMOGLOBIN (HGB A1C): Hemoglobin A1C: 6.6 % — AB (ref 4.0–5.6)
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
## 2022-09-21 ENCOUNTER — Ambulatory Visit: Payer: Medicare PPO | Admitting: Occupational Therapy

## 2022-09-21 DIAGNOSIS — M25632 Stiffness of left wrist, not elsewhere classified: Secondary | ICD-10-CM

## 2022-09-21 DIAGNOSIS — M6281 Muscle weakness (generalized): Secondary | ICD-10-CM

## 2022-09-21 DIAGNOSIS — M79642 Pain in left hand: Secondary | ICD-10-CM

## 2022-09-21 DIAGNOSIS — M25642 Stiffness of left hand, not elsewhere classified: Secondary | ICD-10-CM | POA: Diagnosis not present
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
## 2022-09-23 ENCOUNTER — Encounter: Payer: Medicare PPO | Admitting: Occupational Therapy

## 2022-09-27 ENCOUNTER — Ambulatory Visit: Payer: Medicare PPO | Admitting: Occupational Therapy

## 2022-09-27 DIAGNOSIS — M6281 Muscle weakness (generalized): Secondary | ICD-10-CM

## 2022-09-27 DIAGNOSIS — M25632 Stiffness of left wrist, not elsewhere classified: Secondary | ICD-10-CM

## 2022-09-27 DIAGNOSIS — M25642 Stiffness of left hand, not elsewhere classified: Secondary | ICD-10-CM

## 2022-09-27 DIAGNOSIS — M79642 Pain in left hand: Secondary | ICD-10-CM | POA: Diagnosis not present
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
## 2022-09-29 ENCOUNTER — Ambulatory Visit: Payer: Medicare PPO | Attending: Surgery | Admitting: Occupational Therapy

## 2022-09-29 ENCOUNTER — Telehealth: Payer: Self-pay

## 2022-09-29 DIAGNOSIS — M25642 Stiffness of left hand, not elsewhere classified: Secondary | ICD-10-CM | POA: Insufficient documentation

## 2022-09-29 DIAGNOSIS — M25632 Stiffness of left wrist, not elsewhere classified: Secondary | ICD-10-CM | POA: Insufficient documentation

## 2022-09-29 DIAGNOSIS — M6281 Muscle weakness (generalized): Secondary | ICD-10-CM | POA: Insufficient documentation

## 2022-09-29 DIAGNOSIS — M79642 Pain in left hand: Secondary | ICD-10-CM | POA: Insufficient documentation

## 2022-09-29 MED ORDER — BEVESPI AEROSPHERE 9-4.8 MCG/ACT IN AERO: INHALATION_SPRAY | RESPIRATORY_TRACT | 11 refills | Status: DC

## 2022-09-29 NOTE — Telephone Encounter (Signed)
Bevespi Refill sent into [facility] for 2024 renewal   Malva Limes, [place] Pharmacist Practitioner  [facility] [phone]
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
## 2022-10-03 ENCOUNTER — Other Ambulatory Visit: Payer: Self-pay | Admitting: Family Medicine

## 2022-10-04 ENCOUNTER — Ambulatory Visit: Payer: Medicare PPO | Admitting: Occupational Therapy

## 2022-10-04 DIAGNOSIS — M6281 Muscle weakness (generalized): Secondary | ICD-10-CM | POA: Diagnosis not present

## 2022-10-04 DIAGNOSIS — M25642 Stiffness of left hand, not elsewhere classified: Secondary | ICD-10-CM | POA: Diagnosis not present

## 2022-10-04 DIAGNOSIS — M79642 Pain in left hand: Secondary | ICD-10-CM | POA: Diagnosis not present

## 2022-10-04 DIAGNOSIS — M25632 Stiffness of left wrist, not elsewhere classified: Secondary | ICD-10-CM | POA: Diagnosis not present
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
## 2022-10-06 ENCOUNTER — Ambulatory Visit: Payer: Medicare PPO | Admitting: Occupational Therapy

## 2022-10-06 DIAGNOSIS — M79642 Pain in left hand: Secondary | ICD-10-CM

## 2022-10-06 DIAGNOSIS — M25632 Stiffness of left wrist, not elsewhere classified: Secondary | ICD-10-CM

## 2022-10-06 DIAGNOSIS — M25642 Stiffness of left hand, not elsewhere classified: Secondary | ICD-10-CM

## 2022-10-06 DIAGNOSIS — M6281 Muscle weakness (generalized): Secondary | ICD-10-CM
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
## 2022-10-12 ENCOUNTER — Telehealth: Payer: Self-pay

## 2022-10-12 ENCOUNTER — Ambulatory Visit: Payer: Medicare PPO | Admitting: Occupational Therapy

## 2022-10-12 DIAGNOSIS — M25642 Stiffness of left hand, not elsewhere classified: Secondary | ICD-10-CM | POA: Diagnosis not present

## 2022-10-12 DIAGNOSIS — M79642 Pain in left hand: Secondary | ICD-10-CM

## 2022-10-12 DIAGNOSIS — M6281 Muscle weakness (generalized): Secondary | ICD-10-CM | POA: Diagnosis not present

## 2022-10-12 DIAGNOSIS — M25632 Stiffness of left wrist, not elsewhere classified: Secondary | ICD-10-CM | POA: Diagnosis not present
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
## 2022-10-12 NOTE — Progress Notes (Signed)
Chronic Care Management Pharmacy Assistant   Name: Cindy Griffin  MRN: [id number] DOB: June 15, 1946  Reason for Encounter: Medication Review/Patient assistance renewal update for Trulicity.   Medications: Outpatient Encounter Medications as of 10/12/2022  Medication Sig   acetaminophen (TYLENOL) 500 MG tablet Take 1,000 mg by mouth every 6 (six) hours as needed for moderate pain or headache.    albuterol (VENTOLIN HFA) 108 (90 Base) MCG/ACT inhaler INHALE 2 PUFFS INTO THE LUNGS EVERY 6 HOURS AS NEEDED FOR WHEEZING OR SHORTNESS OF BREATH.   Ascorbic Acid (VITAMIN C) 1000 MG tablet Take 1,000 mg by mouth daily.   aspirin EC 81 MG tablet Take 1 tablet (81 mg total) by mouth daily.   atorvastatin (LIPITOR) 80 MG tablet TAKE 1 TABLET EVERY DAY   BD PEN NEEDLE MICRO U/F 32G X 6 MM MISC USE ONCE DAILY   Carboxymethylcellul-Glycerin (LUBRICATING EYE DROPS OP) Place 2 drops into both eyes 2 (two) times daily as needed (for dry eyes).   carvedilol (COREG) 6.25 MG tablet Take 6.25 mg by mouth daily.   cetirizine (ZYRTEC) 10 MG tablet Take 10 mg by mouth every other day as needed for allergies.    Cholecalciferol (VITAMIN D-3) 1000 units CAPS Take 1,000 Units by mouth daily.    Dulaglutide (TRULICITY) 3 PI/9.5JO SOPN Inject 3 mg as directed once a week. Patient receives through [employer] Patient Assistance through Dec 2023   famotidine (PEPCID) 20 MG tablet TAKE 1 TABLET TWICE DAILY   fluticasone (FLONASE) 50 MCG/ACT nasal spray USE 2 SPRAYS IN EACH NOSTRIL EVERY DAY   furosemide (LASIX) 20 MG tablet TAKE 1 TABLET EVERY DAY   Ginger, Zingiber officinalis, (GINGER PO) Take by mouth. Patient reports taking OTC Ginger for nausea relief   Glycopyrrolate-Formoterol (BEVESPI AEROSPHERE) 9-4.8 MCG/ACT AERO INHALE 2 PUFFS BY MOUTH AT BEDTIME. Patient receives through [employer] Patient Assistance   HYDROcodone-acetaminophen (NORCO/VICODIN) 5-325 MG tablet Take 1-2 tablets by mouth every 6 (six) hours as needed  for moderate pain or severe pain.   Lancets (ONETOUCH DELICA PLUS [id number]) MISC CHECK GLUCOSE THREE TIMES DAILY   lisinopril (ZESTRIL) 40 MG tablet TAKE 1 TABLET AT BEDTIME   meclizine (ANTIVERT) 25 MG tablet Take 1 tablet (25 mg total) by mouth 3 (three) times daily as needed.   metFORMIN (GLUCOPHAGE-XR) 750 MG 24 hr tablet TAKE 2 TABLETS (1,500 MG TOTAL) BY MOUTH DAILY WITH BREAKFAST.   Multiple Vitamins-Minerals (MULTIVITAMIN PO) Take 1 tablet by mouth daily.   Omega-3 1000 MG CAPS Take by mouth.   pioglitazone (ACTOS) 15 MG tablet TAKE 1 TABLET EVERY DAY   polyethylene glycol powder (GLYCOLAX/MIRALAX) 17 GM/SCOOP powder Take 1 Container by mouth as needed. Taking 3 times a week   pregabalin (LYRICA) 75 MG capsule Take 1 capsule (75 mg total) by mouth 2 (two) times daily.   tiZANidine (ZANAFLEX) 2 MG tablet 1 po tid prn   traZODone (DESYREL) 50 MG tablet TAKE 1/2 TO 1 TABLET AT BEDTIME AS NEEDED FOR SLEEP   Turmeric 500 MG CAPS Take by mouth.   vitamin B-12 (CYANOCOBALAMIN) 500 MCG tablet Take 1,000 mcg by mouth daily.   No facility-administered encounter medications on file as of 10/12/2022.   Care Gaps: Ophthalmology Exam Influenza Vaccine  Star Rating Drugs: Atorvastatin 80 mg last filled on 09/22/2022 for 90 day supply [facility] Lisinopril 40 mg last filled on 09/22/2022 for 90 day supply [facility] pharmacy Metformin 750 mg last filled on 09/22/2022 for 90 day supply  [facility] Trulicity 3 mg - receives from [employer].    I reach out to [employer] to get the status of patient assistance application renewal for Trulicity:  Per [employer], patient was approved for year 2024 with a end date of 11/29/2023.I left a voice message informing patient of approval.   [place] Pharmacist Assistant [phone]

## 2022-10-14 ENCOUNTER — Encounter: Payer: Medicare PPO | Admitting: Occupational Therapy

## 2022-10-15 DIAGNOSIS — M654 Radial styloid tenosynovitis [de Quervain]: Secondary | ICD-10-CM | POA: Diagnosis not present

## 2022-10-15 DIAGNOSIS — M1812 Unilateral primary osteoarthritis of first carpometacarpal joint, left hand: Secondary | ICD-10-CM | POA: Diagnosis not present

## 2022-10-18 ENCOUNTER — Ambulatory Visit: Payer: Medicare PPO | Admitting: Occupational Therapy

## 2022-10-18 DIAGNOSIS — M25632 Stiffness of left wrist, not elsewhere classified: Secondary | ICD-10-CM | POA: Diagnosis not present

## 2022-10-18 DIAGNOSIS — M79642 Pain in left hand: Secondary | ICD-10-CM

## 2022-10-18 DIAGNOSIS — M25642 Stiffness of left hand, not elsewhere classified: Secondary | ICD-10-CM

## 2022-10-18 DIAGNOSIS — M6281 Muscle weakness (generalized): Secondary | ICD-10-CM | POA: Diagnosis not present

## 2022-10-18 DIAGNOSIS — E119 Type 2 diabetes mellitus without complications: Secondary | ICD-10-CM | POA: Diagnosis not present

## 2022-10-18 DIAGNOSIS — H2513 Age-related nuclear cataract, bilateral: Secondary | ICD-10-CM | POA: Diagnosis not present

## 2022-10-18 LAB — HM DIABETES EYE EXAM
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
## 2022-10-20 ENCOUNTER — Ambulatory Visit: Payer: Medicare PPO | Admitting: Occupational Therapy

## 2022-10-20 DIAGNOSIS — M25642 Stiffness of left hand, not elsewhere classified: Secondary | ICD-10-CM

## 2022-10-20 DIAGNOSIS — M25632 Stiffness of left wrist, not elsewhere classified: Secondary | ICD-10-CM | POA: Diagnosis not present

## 2022-10-20 DIAGNOSIS — M79642 Pain in left hand: Secondary | ICD-10-CM | POA: Diagnosis not present

## 2022-10-20 DIAGNOSIS — M6281 Muscle weakness (generalized): Secondary | ICD-10-CM
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
## 2022-10-27 ENCOUNTER — Ambulatory Visit: Payer: Medicare PPO | Admitting: Occupational Therapy

## 2022-10-27 DIAGNOSIS — M6281 Muscle weakness (generalized): Secondary | ICD-10-CM | POA: Diagnosis not present

## 2022-10-27 DIAGNOSIS — M79642 Pain in left hand: Secondary | ICD-10-CM

## 2022-10-27 DIAGNOSIS — M25632 Stiffness of left wrist, not elsewhere classified: Secondary | ICD-10-CM

## 2022-10-27 DIAGNOSIS — M25642 Stiffness of left hand, not elsewhere classified: Secondary | ICD-10-CM
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
## 2022-10-29 ENCOUNTER — Ambulatory Visit: Payer: Medicare PPO | Attending: Surgery | Admitting: Occupational Therapy

## 2022-10-29 DIAGNOSIS — M25632 Stiffness of left wrist, not elsewhere classified: Secondary | ICD-10-CM | POA: Diagnosis not present

## 2022-10-29 DIAGNOSIS — M79642 Pain in left hand: Secondary | ICD-10-CM

## 2022-10-29 DIAGNOSIS — M6281 Muscle weakness (generalized): Secondary | ICD-10-CM

## 2022-10-29 DIAGNOSIS — M25642 Stiffness of left hand, not elsewhere classified: Secondary | ICD-10-CM
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
## 2022-11-01 ENCOUNTER — Ambulatory Visit: Payer: Medicare PPO | Admitting: Occupational Therapy

## 2022-11-01 DIAGNOSIS — M25632 Stiffness of left wrist, not elsewhere classified: Secondary | ICD-10-CM

## 2022-11-01 DIAGNOSIS — M25642 Stiffness of left hand, not elsewhere classified: Secondary | ICD-10-CM

## 2022-11-01 DIAGNOSIS — M6281 Muscle weakness (generalized): Secondary | ICD-10-CM

## 2022-11-01 DIAGNOSIS — M79642 Pain in left hand: Secondary | ICD-10-CM

## 2022-11-02 ENCOUNTER — Telehealth: Payer: Self-pay | Admitting: Family Medicine

## 2022-11-02 ENCOUNTER — Telehealth: Payer: Self-pay

## 2022-11-02 NOTE — Progress Notes (Addendum)
Chronic Care Management Pharmacy Assistant   Name: Cindy Griffin  MRN: [id number] DOB: [birth year]  Reason for Encounter:Diabetes Disease State Call.   Recent office visits:  09/17/2022 Dr. Ancil Boozer MD (PCP) No medication Changes noted, Return in about 4 months  Recent consult visits:  10/29/2022 Rosalyn Gess OT (Occupational therapy) No Medication Changes noted 10/27/2022 Rosalyn Gess OT (Occupational therapy) No Medication Changes noted 10/20/2022 Rosalyn Gess OT (Occupational therapy) No Medication Changes noted 10/18/2022 Rosalyn Gess OT (Occupational therapy) No Medication Changes noted 10/15/2022 Dr. Roland Rack MD (Ortho Surgery) No medication Changes noted 10/12/2022 Rosalyn Gess OT (Occupational therapy) No Medication Changes noted 10/06/2022 Rosalyn Gess OT (Occupational therapy) No Medication Changes noted 10/04/2022 Rosalyn Gess OT (Occupational therapy) No Medication Changes noted 09/29/2022 Rosalyn Gess OT (Occupational therapy) No Medication Changes noted 09/27/2022 Rosalyn Gess OT (Occupational therapy) No Medication Changes noted 09/21/2022 Rosalyn Gess OT (Occupational therapy) No Medication Changes noted 09/17/2022 Rosalyn Gess OT (Occupational therapy) No Medication Changes noted 09/15/2022 Rosalyn Gess OT (Occupational therapy) No Medication Changes noted 09/10/2022 Rosalyn Gess OT (Occupational therapy) No Medication Changes noted 09/06/2022 Rosalyn Gess OT (Occupational therapy) No Medication Changes noted  Hospital visits:  None in previous 6 months  Medications: Outpatient Encounter Medications as of 11/02/2022  Medication Sig   acetaminophen (TYLENOL) 500 MG tablet Take 1,000 mg by mouth every 6 (six) hours as needed for moderate pain or headache.    albuterol (VENTOLIN HFA) 108 (90 Base) MCG/ACT inhaler INHALE 2 PUFFS INTO THE LUNGS EVERY 6 HOURS AS NEEDED FOR WHEEZING OR SHORTNESS OF BREATH.   Ascorbic Acid (VITAMIN C)  1000 MG tablet Take 1,000 mg by mouth daily.   aspirin EC 81 MG tablet Take 1 tablet (81 mg total) by mouth daily.   atorvastatin (LIPITOR) 80 MG tablet TAKE 1 TABLET EVERY DAY   BD PEN NEEDLE MICRO U/F 32G X 6 MM MISC USE ONCE DAILY   Carboxymethylcellul-Glycerin (LUBRICATING EYE DROPS OP) Place 2 drops into both eyes 2 (two) times daily as needed (for dry eyes).   carvedilol (COREG) 6.25 MG tablet Take 6.25 mg by mouth daily.   cetirizine (ZYRTEC) 10 MG tablet Take 10 mg by mouth every other day as needed for allergies.    Cholecalciferol (VITAMIN D-3) 1000 units CAPS Take 1,000 Units by mouth daily.    Dulaglutide (TRULICITY) 3 NF/6.2ZH SOPN Inject 3 mg as directed once a week. Patient receives through [employer] Patient Assistance through Dec 2023   famotidine (PEPCID) 20 MG tablet TAKE 1 TABLET TWICE DAILY   fluticasone (FLONASE) 50 MCG/ACT nasal spray USE 2 SPRAYS IN EACH NOSTRIL EVERY DAY   furosemide (LASIX) 20 MG tablet TAKE 1 TABLET EVERY DAY   Ginger, Zingiber officinalis, (GINGER PO) Take by mouth. Patient reports taking OTC Ginger for nausea relief   Glycopyrrolate-Formoterol (BEVESPI AEROSPHERE) 9-4.8 MCG/ACT AERO INHALE 2 PUFFS BY MOUTH AT BEDTIME. Patient receives through [employer] Patient [employer]   HYDROcodone-acetaminophen (NORCO/VICODIN) 5-325 MG tablet Take 1-2 tablets by mouth every 6 (six) hours as needed for moderate pain or severe pain.   Lancets (ONETOUCH DELICA PLUS [id number]) MISC CHECK GLUCOSE THREE TIMES DAILY   lisinopril (ZESTRIL) 40 MG tablet TAKE 1 TABLET AT BEDTIME   meclizine (ANTIVERT) 25 MG tablet Take 1 tablet (25 mg total) by mouth 3 (three) times daily as needed.   metFORMIN (GLUCOPHAGE-XR) 750 MG 24 hr tablet TAKE 2 TABLETS (1,500 MG TOTAL) BY MOUTH DAILY WITH BREAKFAST.   Multiple Vitamins-Minerals (MULTIVITAMIN PO)  Take 1 tablet by mouth daily.   Omega-3 1000 MG CAPS Take by mouth.   pioglitazone (ACTOS) 15 MG tablet TAKE 1 TABLET EVERY DAY    polyethylene glycol powder (GLYCOLAX/MIRALAX) 17 GM/SCOOP powder Take 1 Container by mouth as needed. Taking 3 times a week   pregabalin (LYRICA) 75 MG capsule Take 1 capsule (75 mg total) by mouth 2 (two) times daily.   tiZANidine (ZANAFLEX) 2 MG tablet 1 po tid prn   traZODone (DESYREL) 50 MG tablet TAKE 1/2 TO 1 TABLET AT BEDTIME AS NEEDED FOR SLEEP   Turmeric 500 MG CAPS Take by mouth.   vitamin B-12 (CYANOCOBALAMIN) 500 MCG tablet Take 1,000 mcg by mouth daily.   No facility-administered encounter medications on file as of 11/02/2022.    Care Gaps: COVID-19 Vaccine Influenza Vaccine   Star Rating Drugs: Atorvastatin 80 mg last filled on 09/22/2022 for 90 day supply [facility] Lisinopril 40 mg last filled on 09/22/2022 for 90 day supply [facility] Metformin 750 mg last filled on 09/22/2022 for 90 day supply [facility] Trulicity 3 mg - receives from [facility].  Pioglitazone 15 mg last filled on 09/28/2022 for 90 day supply [facility]  Recent Relevant Labs: Lab Results  Component Value Date/Time   HGBA1C 6.6 (A) 09/17/2022 11:11 AM   HGBA1C 6.8 (A) 05/17/2022 10:39 AM   HGBA1C 9.3 (A) 07/03/2019 09:19 AM   HGBA1C 6.9 03/01/2019 10:05 AM   MICROALBUR 1.6 01/05/2022 01:59 PM   MICROALBUR <0.2 12/10/2020 12:00 AM   MICROALBUR 50 07/22/2017 09:00 AM   MICROALBUR 50 07/15/2016 10:52 AM    Kidney Function Lab Results  Component Value Date/Time   CREATININE 0.77 07/13/2022 10:45 AM   CREATININE 0.82 07/05/2022 11:39 AM   CREATININE 0.80 01/05/2022 01:59 PM   CREATININE 0.86 12/10/2020 12:00 AM   GFRNONAA >60 07/13/2022 10:45 AM   GFRNONAA 67 12/10/2020 12:00 AM   GFRAA 77 12/10/2020 12:00 AM    Current antihyperglycemic regimen:  Trulicity 3 mg weekly  Metformin XR 750 mg 2 tablets daily  Pioglitazone 15 mg 1 tablet daily   What recent interventions/DTPs have been made to improve glycemic control:  None ID  Have there been any recent  hospitalizations or ED visits since last visit with CPP? No  Patient reports hypoglycemic symptoms, including Sweaty and Shaky  Patient states when she waits to eat breakfast she will fell weak, sweaty and shaky, but after she eats her symptoms will improve,  Patient denies hyperglycemic symptoms, including blurry vision, excessive thirst, fatigue, and polyuria  How often are you checking your blood sugar? once daily  What are your blood sugars ranging?  Fasting: 104,112,128,96,131,116,104,115,190-patient reports she ate sweets the night before.   Before meals: None ID  After meals: None ID  Bedtime: None ID  During the week, how often does your blood glucose drop below 70? Never  Are you checking your feet daily/regularly?   Yes, patient denies any concerns regarding her feet.  Adherence Review: Is the patient currently on a STATIN medication? Yes Is the patient currently on ACE/ARB medication? Yes Does the patient have >5 day gap between last estimated fill dates? No   [name] Clinical Production designer, theatre/television/film [phone]

## 2022-11-02 NOTE — Telephone Encounter (Signed)
Copied from [place] [phone]. Topic: General - Inquiry >> Nov 02, 2022  4:04 PM [place] B wrote: Reason for CRM: patient called in asking about getting the rsv shot.

## 2022-11-02 NOTE — Telephone Encounter (Signed)
Spoke with patient and let her know we do not give the RSV shot here but she can get it at the pharmacy.

## 2022-11-03 ENCOUNTER — Ambulatory Visit: Payer: Medicare PPO | Admitting: Occupational Therapy

## 2022-11-03 ENCOUNTER — Encounter: Payer: Self-pay | Admitting: Occupational Therapy

## 2022-11-03 DIAGNOSIS — M79642 Pain in left hand: Secondary | ICD-10-CM

## 2022-11-03 DIAGNOSIS — M25632 Stiffness of left wrist, not elsewhere classified: Secondary | ICD-10-CM

## 2022-11-03 DIAGNOSIS — M6281 Muscle weakness (generalized): Secondary | ICD-10-CM

## 2022-11-03 DIAGNOSIS — M25642 Stiffness of left hand, not elsewhere classified: Secondary | ICD-10-CM | POA: Diagnosis not present
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
## 2022-11-17 ENCOUNTER — Ambulatory Visit: Payer: Medicare PPO | Admitting: Occupational Therapy

## 2022-11-17 DIAGNOSIS — M79642 Pain in left hand: Secondary | ICD-10-CM

## 2022-11-17 DIAGNOSIS — M25642 Stiffness of left hand, not elsewhere classified: Secondary | ICD-10-CM

## 2022-11-17 DIAGNOSIS — M6281 Muscle weakness (generalized): Secondary | ICD-10-CM

## 2022-11-17 DIAGNOSIS — M25632 Stiffness of left wrist, not elsewhere classified: Secondary | ICD-10-CM | POA: Diagnosis not present
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
## 2022-11-20 ENCOUNTER — Other Ambulatory Visit: Payer: Self-pay | Admitting: Family Medicine

## 2022-12-04 ENCOUNTER — Other Ambulatory Visit: Payer: Self-pay | Admitting: Internal Medicine

## 2022-12-04 DIAGNOSIS — K219 Gastro-esophageal reflux disease without esophagitis: Secondary | ICD-10-CM

## 2022-12-04 DIAGNOSIS — E1121 Type 2 diabetes mellitus with diabetic nephropathy: Secondary | ICD-10-CM

## 2022-12-04 DIAGNOSIS — E669 Obesity, unspecified: Secondary | ICD-10-CM

## 2022-12-04 DIAGNOSIS — I10 Essential (primary) hypertension: Secondary | ICD-10-CM

## 2022-12-04 DIAGNOSIS — E1169 Type 2 diabetes mellitus with other specified complication: Secondary | ICD-10-CM

## 2022-12-04 DIAGNOSIS — E1142 Type 2 diabetes mellitus with diabetic polyneuropathy: Secondary | ICD-10-CM

## 2022-12-04 DIAGNOSIS — E785 Hyperlipidemia, unspecified: Secondary | ICD-10-CM

## 2022-12-06 NOTE — Telephone Encounter (Signed)
Requested Prescriptions  Pending Prescriptions Disp Refills   atorvastatin (LIPITOR) 80 MG tablet [Pharmacy Med Name: ATORVASTATIN CALCIUM 80 MG Tablet] 90 tablet 3    Sig: TAKE 1 TABLET EVERY DAY     Cardiovascular:  Antilipid - Statins Failed - 12/04/2022  4:09 AM      Failed - Lipid Panel in normal range within the last 12 months    Cholesterol, Total  Date Value Ref Range Status  11/21/2015 146 100 - 199 mg/dL Final   Cholesterol  Date Value Ref Range Status  01/05/2022 129 <200 mg/dL Final   LDL Cholesterol (Calc)  Date Value Ref Range Status  01/05/2022 48 mg/dL (calc) Final    Comment:    Reference range: <100 . Desirable range <100 mg/dL for primary prevention;   <70 mg/dL for patients with CHD or diabetic patients  with > or = 2 CHD risk factors. [name] LDL-C is now calculated using the Martin-Hopkins  calculation, which is a validated novel method providing  better accuracy than the Friedewald equation in the  estimation of LDL-C.  Cresenciano Genre et al. Annamaria Helling. 8786;767(20): 2061-2068  (http://education.[employer].com/faq/FAQ164)    HDL  Date Value Ref Range Status  01/05/2022 65 > OR = 50 mg/dL Final  11/21/2015 62 >39 mg/dL Final   Triglycerides  Date Value Ref Range Status  01/05/2022 79 <150 mg/dL Final         Passed - Patient is not pregnant      Passed - Valid encounter within last 12 months    Recent Outpatient Visits           2 months ago Dyslipidemia associated with type 2 diabetes mellitus ([place])   [facility] Steele Sizer, MD   6 months ago Dyslipidemia associated with type 2 diabetes mellitus [facility])   [facility] Steele Sizer, MD   11 months ago Major depression in remission [facility])   [facility] Steele Sizer, MD   1 year ago Type 2 diabetes mellitus with other specified complication, with long-term current use of insulin [facility])   [facility] [place], Drue Stager,  MD   1 year ago Diabetes mellitus type 2 in obese [facility])   [facility] Steele Sizer, MD       Future Appointments             In 1 month Ancil Boozer, Drue Stager, MD [facility]             lisinopril (ZESTRIL) 40 MG tablet [Pharmacy Med Name: LISINOPRIL 40 MG Tablet] 90 tablet 3    Sig: TAKE 1 TABLET AT BEDTIME     Cardiovascular:  ACE Inhibitors Passed - 12/04/2022  4:09 AM      Passed - Cr in normal range and within 180 days    Creat  Date Value Ref Range Status  01/05/2022 0.80 0.60 - 1.00 mg/dL Final   Creatinine, Ser  Date Value Ref Range Status  07/13/2022 0.77 0.44 - 1.00 mg/dL Final   Creatinine, Urine  Date Value Ref Range Status  01/05/2022 99 20 - 275 mg/dL Final         Passed - K in normal range and within 180 days    Potassium  Date Value Ref Range Status  07/13/2022 4.0 3.5 - 5.1 mmol/L Final         Passed - Patient is not pregnant      Passed - Last BP in  normal range    BP Readings from Last 1 Encounters:  09/17/22 124/72         Passed - Valid encounter within last 6 months    Recent Outpatient Visits           2 months ago Dyslipidemia associated with type 2 diabetes mellitus [facility])   [facility] [place], Drue Stager, MD   6 months ago Dyslipidemia associated with type 2 diabetes mellitus [facility])   [facility] [place], Drue Stager, MD   11 months ago Major depression in remission [facility])   [facility] [place], Drue Stager, MD   1 year ago Type 2 diabetes mellitus with other specified complication, with long-term current use of insulin [facility])   [facility] [place], Drue Stager, MD   1 year ago Diabetes mellitus type 2 in obese [facility])   [facility] Steele Sizer, MD       Future Appointments             In 1 month Ancil Boozer, Drue Stager, MD [facility] [place], [facility]             famotidine (PEPCID) 20 MG tablet  [Pharmacy Med Name: FAMOTIDINE 20 MG Tablet] 180 tablet 3    Sig: TAKE 1 TABLET TWICE DAILY     Gastroenterology:  H2 Antagonists Passed - 12/04/2022  4:09 AM      Passed - Valid encounter within last 12 months    Recent Outpatient Visits           2 months ago Dyslipidemia associated with type 2 diabetes mellitus [facility])   [facility] [place], Drue Stager, MD   6 months ago Dyslipidemia associated with type 2 diabetes mellitus [facility])   [facility] [place], Drue Stager, MD   11 months ago Major depression in remission [facility])   [facility] [place], Drue Stager, MD   1 year ago Type 2 diabetes mellitus with other specified complication, with long-term current use of insulin [facility])   [facility] [place], Drue Stager, MD   1 year ago Diabetes mellitus type 2 in obese [facility])   [facility] Steele Sizer, MD       Future Appointments             In 1 month Steele Sizer, MD [facility]             metFORMIN (GLUCOPHAGE-XR) 750 MG 24 hr tablet [Pharmacy Med Name: METFORMIN HYDROCHLORIDE ER 750 MG Tablet Extended Release 24 Hour] 180 tablet 3    Sig: TAKE 2 TABLETS (1,500 MG TOTAL) DAILY WITH BREAKFAST.     Endocrinology:  Diabetes - Biguanides Failed - 12/04/2022  4:09 AM      Failed - B12 Level in normal range and within 720 days    Vitamin B-12  Date Value Ref Range Status  01/05/2022 1,370 (H) 200 - 1,100 pg/mL Final         Passed - Cr in normal range and within 360 days    Creat  Date Value Ref Range Status  01/05/2022 0.80 0.60 - 1.00 mg/dL Final   Creatinine, Ser  Date Value Ref Range Status  07/13/2022 0.77 0.44 - 1.00 mg/dL Final   Creatinine, Urine  Date Value Ref Range Status  01/05/2022 99 20 - 275 mg/dL Final         Passed - HBA1C is between 0 and 7.9 and within 180  days    Hemoglobin A1C  Date Value Ref Range Status  09/17/2022 6.6 (A) 4.0 - 5.6 % Final    HbA1c, POC (controlled diabetic range)  Date Value Ref Range Status  07/03/2019 9.3 (A) 0.0 - 7.0 % Final         Passed - eGFR in normal range and within 360 days    GFR, Est African American  Date Value Ref Range Status  12/10/2020 77 > OR = 60 mL/min/1.62m2 Final   GFR, Est Non African American  Date Value Ref Range Status  12/10/2020 67 > OR = 60 mL/min/1.89m2 Final   GFR, Estimated  Date Value Ref Range Status  07/13/2022 >60 >60 mL/min Final    Comment:    (NOTE) Calculated using the CKD-EPI Creatinine Equation (2021)    eGFR  Date Value Ref Range Status  01/05/2022 77 > OR = 60 mL/min/1.65m2 Final    Comment:    The eGFR is based on the CKD-EPI 2021 equation. To calculate  the new eGFR from a previous Creatinine or Cystatin C result, go to https://www.kidney.org/professionals/ kdoqi/gfr%5Fcalculator          Passed - Valid encounter within last 6 months    Recent Outpatient Visits           2 months ago Dyslipidemia associated with type 2 diabetes mellitus [facility] [place])   [facility] [place], Drue Stager, MD   6 months ago Dyslipidemia associated with type 2 diabetes mellitus [facility])   [facility] [place], Drue Stager, MD   11 months ago Major depression in remission [facility])   [facility] [place], Drue Stager, MD   1 year ago Type 2 diabetes mellitus with other specified complication, with long-term current use of insulin [facility])   [facility] [place], Drue Stager, MD   1 year ago Diabetes mellitus type 2 in obese [facility])   [facility] Steele Sizer, MD       Future Appointments             In 1 month Steele Sizer, MD [facility] [place] within normal limits and completed in the last 12 months    WBC  Date Value Ref Range Status  07/13/2022 6.0 4.0 - 10.5 K/uL Final   RBC  Date Value Ref Range Status  07/13/2022 4.25 3.87 - 5.11  MIL/uL Final   Hemoglobin  Date Value Ref Range Status  07/13/2022 12.3 12.0 - 15.0 g/dL Final  11/21/2015 14.0 11.1 - 15.9 g/dL Final   HCT  Date Value Ref Range Status  07/13/2022 38.1 36.0 - 46.0 % Final   Hematocrit  Date Value Ref Range Status  11/21/2015 41.6 34.0 - 46.6 % Final   MCHC  Date Value Ref Range Status  07/13/2022 32.3 30.0 - 36.0 g/dL Final   [facility]  Date Value Ref Range Status  07/13/2022 28.9 26.0 - 34.0 pg Final   MCV  Date Value Ref Range Status  07/13/2022 89.6 80.0 - 100.0 fL Final  11/21/2015 83 79 - 97 fL Final   No results found for: "PLTCOUNTKUC", "LABPLAT", "POCPLA" RDW  Date Value Ref Range Status  07/13/2022 14.7 11.5 - 15.5 % Final  11/21/2015 15.8 (H) 12.3 - 15.4 % Final

## 2022-12-08 ENCOUNTER — Ambulatory Visit: Payer: Medicare HMO

## 2022-12-08 DIAGNOSIS — E1142 Type 2 diabetes mellitus with diabetic polyneuropathy: Secondary | ICD-10-CM

## 2022-12-08 DIAGNOSIS — I152 Hypertension secondary to endocrine disorders: Secondary | ICD-10-CM

## 2022-12-08 DIAGNOSIS — E1159 Type 2 diabetes mellitus with other circulatory complications: Secondary | ICD-10-CM

## 2022-12-08 DIAGNOSIS — E1169 Type 2 diabetes mellitus with other specified complication: Secondary | ICD-10-CM

## 2022-12-08 MED ORDER — BLOOD GLUCOSE MONITOR KIT: PACK | 0 refills | Status: DC

## 2022-12-08 NOTE — Progress Notes (Signed)
Care Management & Coordination Services Pharmacy Note  12/08/2022 Name:  Cindy Griffin MRN:  [id number] DOB:  12/06/1945  Summary: Patient presents for follow-up consult.   -Inhaler technique reviewed today.   - Home blood sugar and blood pressures controlled.  Recommendations/Changes made from today's visit: Continue current medications  Follow up plan: CPP follow-up 6 months    Subjective: Cindy Griffin is an 77 y.o. year old female who is a primary patient of Steele Sizer, MD.  The care coordination team was consulted for assistance with disease management and care coordination needs.    Engaged with patient by telephone for follow up visit.  Patient Care Team: Steele Sizer, MD as PCP - General (Family Medicine) Sharlet Salina, MD as Consulting Physician (Physical Medicine and Rehabilitation) Merlene Morse, MD as Consulting Physician (Orthopedic Surgery) Ileana Roup, MD as Consulting Physician (General Surgery) Germaine Pomfret, [facility] as Pharmacist (Pharmacist) Corey Skains, MD as Consulting Physician (Cardiology) Erby Pian, MD as Referring Physician (Pulmonary Disease) Cammie Sickle, MD as Consulting Physician (Hematology and Oncology)  Recent office visits: 09/17/22: Patient presented to Dr. Ancil Boozer for follow-up. A1c 6.6%.   Recent consult visits: 07/15/22: Patient presented to Dr. Rogue Bussing (oncology)   Hospital visits: None in previous 6 months   Objective:  Lab Results  Component Value Date   CREATININE 0.77 07/13/2022   BUN 18 07/13/2022   EGFR 77 01/05/2022   GFRNONAA >60 07/13/2022   GFRAA 77 12/10/2020   NA 141 07/13/2022   K 4.0 07/13/2022   CALCIUM 9.7 07/13/2022   CO2 25 07/13/2022   GLUCOSE 168 (H) 07/13/2022    Lab Results  Component Value Date/Time   HGBA1C 6.6 (A) 09/17/2022 11:11 AM   HGBA1C 6.8 (A) 05/17/2022 10:39 AM   HGBA1C 9.3 (A) 07/03/2019 09:19 AM   HGBA1C 6.9 03/01/2019 10:05  AM   FRUCTOSAMINE 264 12/27/2017 09:56 AM   MICROALBUR 1.6 01/05/2022 01:59 PM   MICROALBUR <0.2 12/10/2020 12:00 AM   MICROALBUR 50 07/22/2017 09:00 AM   MICROALBUR 50 07/15/2016 10:52 AM    Last diabetic Eye exam:  Lab Results  Component Value Date/Time   HMDIABEYEEXA No Retinopathy 10/18/2022 12:00 AM    Last diabetic Foot exam: No results found for: "HMDIABFOOTEX"   Lab Results  Component Value Date   CHOL 129 01/05/2022   HDL 65 01/05/2022   LDLCALC 48 01/05/2022   TRIG 79 01/05/2022   CHOLHDL 2.0 01/05/2022       Latest Ref Rng & Units 07/13/2022   10:45 AM 01/05/2022    1:59 PM 12/10/2020   12:00 AM  Hepatic Function  Total Protein 6.5 - 8.1 g/dL 8.0  7.3  7.4   Albumin 3.5 - 5.0 g/dL 4.3     AST 15 - 41 U/L 18  16  15    ALT 0 - 44 U/L 14  17  15    Alk Phosphatase 38 - 126 U/L 66     Total Bilirubin 0.3 - 1.2 mg/dL 0.3  0.5  0.3     No results found for: "TSH", "FREET4"     Latest Ref Rng & Units 07/13/2022   10:45 AM 07/05/2022   11:39 AM 01/05/2022    1:59 PM  CBC  WBC 4.0 - 10.5 K/uL 6.0  5.4  10.5   Hemoglobin 12.0 - 15.0 g/dL 12.3  11.5  12.9   Hematocrit 36.0 - 46.0 % 38.1  36.7  39.2   Platelets  150 - 400 K/uL 286  279  330     Lab Results  Component Value Date/Time   VD25OH 40 01/05/2022 01:59 PM   VD25OH 30 12/10/2020 12:00 AM   VITAMINB12 1,370 (H) 01/05/2022 01:59 PM   VITAMINB12 1,064 12/10/2020 12:00 AM    Clinical ASCVD: No  The ASCVD Risk score (Arnett DK, et al., 2019) failed to calculate for the following reasons:   The valid total cholesterol range is 130 to 320 mg/dL       09/17/2022   11:10 AM 07/20/2022    3:45 PM 05/17/2022   10:35 AM  Depression screen PHQ 2/9  Decreased Interest 0  0  Down, Depressed, Hopeless 0 0 0  PHQ - 2 Score 0 0 0  Altered sleeping 1 0 1  Tired, decreased energy 0 0 1  Change in appetite 3 0 0  Feeling bad or failure about yourself  0 0 0  Trouble concentrating 0 0 0  Moving slowly or  fidgety/restless 0 0 0  Suicidal thoughts 0 0 0  PHQ-9 Score 4 0 2    Social History   Tobacco Use  Smoking Status Never  Smokeless Tobacco Never  Tobacco Comments   smoking cessation materials not required   BP Readings from Last 3 Encounters:  09/17/22 124/72  07/20/22 120/62  07/14/22 (!) 160/81   Pulse Readings from Last 3 Encounters:  09/17/22 89  07/20/22 86  07/14/22 72   Wt Readings from Last 3 Encounters:  09/17/22 222 lb (100.7 kg)  07/20/22 221 lb 3.2 oz (100.3 kg)  07/14/22 220 lb (99.8 kg)   BMI Readings from Last 3 Encounters:  09/17/22 38.11 kg/m  07/20/22 37.97 kg/m  07/14/22 37.76 kg/m    Allergies  Allergen Reactions   Doxycycline Rash    Itching    Medications Reviewed Today     Reviewed by Achilles Dunk, OT (Occupational Therapist) on 11/03/22 at [place] List Status: <None>   Medication Order Taking? Sig Documenting Provider Last Dose Status Informant  acetaminophen (TYLENOL) 500 MG tablet [id number] No Take 1,000 mg by mouth every 6 (six) hours as needed for moderate pain or headache.  [provider] Taking Active Self  albuterol (VENTOLIN HFA) 108 (90 Base) MCG/ACT inhaler [id number]  INHALE 2 PUFFS INTO THE LUNGS EVERY 6 HOURS AS NEEDED FOR WHEEZING OR SHORTNESS OF BREATH. Steele Sizer, MD  Active   Ascorbic Acid (VITAMIN C) 1000 MG tablet [id number] No Take 1,000 mg by mouth daily. [provider] Taking Active Self  aspirin EC 81 MG tablet [id number] No Take 1 tablet (81 mg total) by mouth daily. Steele Sizer, MD Taking Active Self  atorvastatin (LIPITOR) 80 MG tablet [id number] No TAKE 1 TABLET EVERY DAY Teodora Medici, DO Taking Active   BD PEN NEEDLE MICRO U/F 32G X 6 MM MISC [id number] No USE ONCE DAILY Steele Sizer, MD Taking Active   Carboxymethylcellul-Glycerin (LUBRICATING EYE DROPS OP) [id number] No Place 2 drops into both eyes 2 (two) times daily as needed (for dry eyes). [provider] Taking  Active Self  carvedilol (COREG) 6.25 MG tablet [id number] No Take 6.25 mg by mouth daily. [provider] Taking Active Self  cetirizine (ZYRTEC) 10 MG tablet [id number] No Take 10 mg by mouth every other day as needed for allergies.  [provider] Taking Active Self           Med Note Arvella Nigh, [place] A   Thu May 06, 2016 12:00 PM)    Cholecalciferol (VITAMIN D-3) 1000 units CAPS [id number] No Take 1,000 Units by mouth daily.  [provider] Taking Active Self  Dulaglutide (TRULICITY) 3 QT/6.2UQ SOPN [id number] No Inject 3 mg as directed once a week. Patient receives through [employer] Patient Assistance through Dec 2023 Steele Sizer, MD Taking Active   famotidine (PEPCID) 20 MG tablet [id number] No TAKE 1 TABLET TWICE DAILY Teodora Medici, DO Taking Active   fluticasone [facility]) 50 MCG/ACT nasal spray [id number] No USE 2 SPRAYS IN EACH NOSTRIL EVERY Eloise Harman, MD Taking Active   furosemide (LASIX) 20 MG tablet [id number] No TAKE 1 TABLET EVERY DAY Steele Sizer, MD Taking Active   Ginger, Zingiber officinalis, (GINGER PO) [id number] No Take by mouth. Patient reports taking OTC Ginger for nausea relief [provider] Taking Active   Glycopyrrolate-Formoterol (BEVESPI AEROSPHERE) 9-4.8 MCG/ACT AERO [id number]  INHALE 2 PUFFS BY MOUTH AT BEDTIME. Patient receives through [employer] Patient Assistance Steele Sizer, MD  Active   HYDROcodone-acetaminophen (NORCO/VICODIN) 5-325 MG tablet [id number] No Take 1-2 tablets by mouth every 6 (six) hours as needed for moderate pain or severe pain. Poggi, Marshall Cork, MD Taking Active   Lancets (ONETOUCH DELICA PLUS [id number]) [place] [id number] No CHECK GLUCOSE THREE TIMES DAILY [provider] Taking Active   lisinopril (ZESTRIL) 40 MG tablet [id number] No TAKE 1 TABLET AT BEDTIME Teodora Medici, DO Taking Active   meclizine (ANTIVERT) 25 MG tablet [id number] No Take 1 tablet (25 mg total) by mouth 3 (three) times  daily as needed. Poggi, Marshall Cork, MD Taking Active   metFORMIN (GLUCOPHAGE-XR) 750 MG 24 hr tablet [id number] No TAKE 2 TABLETS (1,500 MG TOTAL) BY MOUTH DAILY WITH BREAKFAST. Teodora Medici, DO Taking Active   Multiple Vitamins-Minerals (MULTIVITAMIN PO) [id number] No Take 1 tablet by mouth daily. [provider] Taking Active Self  Omega-3 1000 MG CAPS [id number] No Take by mouth. [provider] Taking Active   pioglitazone (ACTOS) 15 MG tablet [id number] No TAKE 1 TABLET EVERY DAY Sowles, Drue Stager, MD Taking Active   polyethylene glycol powder (GLYCOLAX/MIRALAX) 17 GM/SCOOP powder [id number] No Take 1 Container by mouth as needed. Taking 3 times a week [provider] Taking Active   pregabalin (LYRICA) 75 MG capsule [id number] No Take 1 capsule (75 mg total) by mouth 2 (two) times daily. Steele Sizer, MD Taking Active   tiZANidine (ZANAFLEX) 2 MG tablet [id number] No 1 po tid prn [provider] Taking Active   traZODone (DESYREL) 50 MG tablet [id number] No TAKE 1/2 TO 1 TABLET AT BEDTIME AS NEEDED FOR SLEEP Steele Sizer, MD Taking Active   Turmeric 500 MG CAPS [id number] No Take by mouth. [provider] Taking Active Self  vitamin B-12 (CYANOCOBALAMIN) 500 MCG tablet [id number] No Take 1,000 mcg by mouth daily. [provider] Taking Active Self            Patient Active Problem List   Diagnosis Date Noted   Chronic left shoulder pain 09/17/2022   Hypertension associated with type 2 diabetes mellitus ([place]) 09/17/2022   Major depression in remission ([place]) 09/17/2022   Primary osteoarthritis of first carpometacarpal joint of left hand 08/27/2022   Senile purpura ([place]) 05/17/2022   Bilateral leg edema 05/01/2020   SOBOE (shortness of breath on exertion) 05/01/2020   Colon cancer, ascending ([place]) 05/25/2019   Mild episode of recurrent major depressive disorder ([place]) 08/01/2018   History of colon cancer 05/19/2018   DDD (degenerative  disc  disease), lumbar 07/28/2015   Spinal stenosis at L4-L5 level 06/12/2015   Benign essential HTN 05/25/2015   Bunion 05/25/2015   Cardiac enlargement 05/25/2015   Cervical radicular pain 05/25/2015   Back pain, chronic 05/25/2015   Osteoarthritis 05/25/2015   Dyslipidemia 05/25/2015   Gastric reflux 05/25/2015   Insomnia 05/25/2015   Eczema intertrigo 05/25/2015   Bronchitis, chronic, mucopurulent ([place]) 05/25/2015   Numerous moles 05/25/2015   Morbid obesity ([place]) 05/25/2015   Perennial allergic rhinitis 05/25/2015   History of artificial joint 05/25/2015   Dyslipidemia associated with type 2 diabetes mellitus ([place]) 05/25/2015   Degeneration of intervertebral disc of cervical region 02/06/2015   Neuritis or radiculitis due to rupture of lumbar intervertebral disc 01/14/2015   Vitamin D deficiency 05/04/2010    Immunization History  Administered Date(s) Administered   Fluad Quad(high Dose 65+) 11/12/2019, 09/03/2020, 08/21/2021   Influenza, High Dose Seasonal PF 08/29/2015, 08/25/2016, 08/01/2018   Influenza, Seasonal, Injecte, Preservative Fre 08/03/2011, 08/21/2012   Influenza,inj,Quad PF,6+ Mos 08/13/2013, 07/17/2014   Influenza-Unspecified 07/17/2014, 08/31/2017   PFIZER(Purple Top)SARS-COV-2 Vaccination 01/08/2020, 01/29/2020, 08/27/2020, 03/19/2021   PPD Test 10/08/2015   Pfizer Covid-19 Vaccine Bivalent Booster 5y-11y 08/10/2021   Pneumococcal Conjugate-13 07/17/2014   Pneumococcal Polysaccharide-23 05/04/2010, 08/29/2015   Tdap 05/04/2010, 01/05/2022   Unspecified SARS-COV-2 Vaccination 09/05/2022   Zoster Recombinat (Shingrix) 09/18/2018, 11/27/2018   Zoster, Live 12/25/2010     Compliance/Adherence/Medication fill history: Care Gaps: None noted   Star-Rating Drugs: Atorvastatin 80 mg last filled on 09/22/2022 for 90 day supply [facility] Lisinopril 40 mg last filled on 09/22/2022 for 90 day supply [place] Metformin 750 mg last filled on  09/22/2022 for 90 day supply [facility] pharmacy Pioglitazone 15 mg last filled on 09/28/2022 for 90 day supply [facility]  SDOH:  (Social Determinants of Health) assessments and interventions performed: Yes SDOH Interventions    Flowsheet Row Clinical Support from 07/20/2022 in [facility] from 11/25/2021 in [facility] from 10/21/2021 in [facility] from 09/09/2021 in [place] from 07/16/2021 in [facility] from 07/02/2021 in [facility]  SDOH Interventions        Food Insecurity Interventions Intervention Not Indicated -- -- -- Intervention Not Indicated --  Housing Interventions Intervention Not Indicated -- -- -- Intervention Not Indicated --  Transportation Interventions Intervention Not Indicated -- -- -- Intervention Not Indicated --  Depression Interventions/Treatment  -- -- -- -- PHQ2-9 Score <4 Follow-up Not Indicated --  Financial Strain Interventions Intervention Not Indicated Intervention Not Indicated Other (Comment)  [PAP] Other (Comment)  [PAP] Intervention Not Indicated Intervention Not Indicated  Physical Activity Interventions Intervention Not Indicated -- -- -- Intervention Not Indicated --  Stress Interventions Intervention Not Indicated -- -- -- Intervention Not Indicated --  Social Connections Interventions Intervention Not Indicated -- -- -- Intervention Not Indicated --      SDOH Screenings   Food Insecurity: No Food Insecurity (07/20/2022)  Housing: Low Risk  (07/20/2022)  Transportation Needs: No Transportation Needs (07/20/2022)  Alcohol Screen: Low Risk  (07/16/2021)  Depression (PHQ2-9): Low Risk  (09/17/2022)  Financial Resource Strain: Low Risk  (07/20/2022)  Physical Activity: Sufficiently Active (07/20/2022)  Social Connections:  Moderately Isolated (07/20/2022)  Stress: No Stress Concern Present (07/20/2022)  Tobacco Use: Low Risk  (11/03/2022)    Medication Assistance:  Trulicity obtained through [employer] medication  assistance program.  Enrollment ends Dec 2024  Bevespi obtained through [employer] medication assistance program.  Enrollment ends Dec 2024  Medication Access: Within the past 30 days, how often has patient missed a dose of medication? No Is a pillbox or other method used to improve adherence? Yes  Factors that may affect medication adherence? financial need Are meds synced by current pharmacy? No  Are meds delivered by current pharmacy? Yes  Does patient experience delays in picking up medications due to transportation concerns? No   [facility] Services Reviewed: Is patient disadvantaged to use [facility]?: Yes  Current Rx insurance plan: [employer] Name and location of Current pharmacy:  [facility] - [place], [place] [address] Phone: [phone] Fax: [phone]  [facility] [address] [place]) [place] [address] Phone: [phone] Fax: [phone]  [facility] services reviewed with patient today?: Yes  Patient requests to transfer care to [facility] Pharm[facility]?: No  Reason patient declined to change pharmacies: Disadvantaged due to insurance/mail order   Assessment/Plan  Diabetes (A1c goal <7%) -Controlled -Current medications: Trulicity 3 mg weekly: Appropriate, Effective, Safe, Accessible Metformin XR 750 mg 2 tablets daily: Appropriate, Effective, Safe, Accessible  -Medications previously tried: Actos (hypoglycemia), glipizide, Invokana, Soliqua,   -Nausea has improved, still has some mild bouts.  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 -Continue current medications  Hypertension (BP goal <140/90) -Controlled -Current treatment: Carvedilol 6.25 mg twice daily: Appropriate, Effective, Safe, Accessible  Furosemide 20 mg daily: Appropriate, Effective, Safe, Accessible  Lisinopril 40 mg daily: Appropriate, Effective, Safe, Accessible  -Medications previously tried: NA  -Current home readings: 124/69, 137/76  -Denies hypotensive/hypertensive symptoms -Recommended to continue current medication  Hyperlipidemia: (LDL goal < 70) -Controlled -Current treatment: Atorvastatin 80 mg daily  -Current antiplatelet treatment: Aspirin 81 mg daily  -Medications previously tried: NA  -Educated on Importance of limiting foods high in cholesterol; -Recommended to continue current medication  COPD (Goal: control symptoms and prevent exacerbations) -Controlled -Current treatment  Ventolin HFA 2 puffs every 6 hours as needed  Bevespi 2 puffs at bedtime  -Medications previously tried: NA  -Gold Grade: Gold 1 (FEV1>80%) -Current COPD Classification:  A (low sx, <2 exacerbations/yr) -MMRC/CAT score: NA -Pulmonary function testing: FEV1 92% (Jun 2021) -Exacerbations requiring treatment in last 6 months: None -Patient reports consistent use of maintenance inhaler -Frequency of rescue inhaler use: Infrequently -Inhaler technique reviewed today.  -Recommended to continue current medication  Insomnia (Goal: Improve sleep quality) -Controlled -Current treatment: Trazodone 50 mg 1/2 - 1 tablet nightly as needed - Sleep -Medications previously tried/failed: NA -PHQ9: 4 -GAD7: NA -Educated on Benefits of medication for symptom control -Recommended to continue current medication  Malva Limes, [place] Pharmacist  Practitioner  [facility] [phone]

## 2022-12-08 NOTE — Addendum Note (Signed)
Addended by: [name] A on: 12/08/2022 01:40 PM   Modules accepted: Orders

## 2022-12-17 DIAGNOSIS — M1812 Unilateral primary osteoarthritis of first carpometacarpal joint, left hand: Secondary | ICD-10-CM | POA: Diagnosis not present

## 2022-12-17 DIAGNOSIS — E785 Hyperlipidemia, unspecified: Secondary | ICD-10-CM | POA: Diagnosis not present

## 2022-12-17 DIAGNOSIS — M654 Radial styloid tenosynovitis [de Quervain]: Secondary | ICD-10-CM | POA: Diagnosis not present

## 2022-12-17 DIAGNOSIS — E1169 Type 2 diabetes mellitus with other specified complication: Secondary | ICD-10-CM | POA: Diagnosis not present

## 2022-12-27 ENCOUNTER — Other Ambulatory Visit: Payer: Self-pay | Admitting: Family Medicine

## 2022-12-27 DIAGNOSIS — J3089 Other allergic rhinitis: Secondary | ICD-10-CM
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
## 2023-01-03 ENCOUNTER — Encounter: Payer: Self-pay | Admitting: Family Medicine

## 2023-01-03 ENCOUNTER — Ambulatory Visit (INDEPENDENT_AMBULATORY_CARE_PROVIDER_SITE_OTHER): Payer: Medicare PPO | Admitting: Family Medicine

## 2023-01-03 VITALS — BP 118/72 | HR 77 | Temp 97.9°F | Resp 16 | Ht 64.0 in | Wt 229.6 lb

## 2023-01-03 DIAGNOSIS — Z85038 Personal history of other malignant neoplasm of large intestine: Secondary | ICD-10-CM | POA: Diagnosis not present

## 2023-01-03 DIAGNOSIS — J411 Mucopurulent chronic bronchitis: Secondary | ICD-10-CM | POA: Diagnosis not present

## 2023-01-03 DIAGNOSIS — E1142 Type 2 diabetes mellitus with diabetic polyneuropathy: Secondary | ICD-10-CM

## 2023-01-03 DIAGNOSIS — F325 Major depressive disorder, single episode, in full remission: Secondary | ICD-10-CM

## 2023-01-03 DIAGNOSIS — D692 Other nonthrombocytopenic purpura: Secondary | ICD-10-CM | POA: Diagnosis not present

## 2023-01-03 DIAGNOSIS — Z9181 History of falling: Secondary | ICD-10-CM

## 2023-01-03 DIAGNOSIS — E1159 Type 2 diabetes mellitus with other circulatory complications: Secondary | ICD-10-CM

## 2023-01-03 DIAGNOSIS — E559 Vitamin D deficiency, unspecified: Secondary | ICD-10-CM | POA: Diagnosis not present

## 2023-01-03 DIAGNOSIS — I152 Hypertension secondary to endocrine disorders: Secondary | ICD-10-CM

## 2023-01-03 DIAGNOSIS — E538 Deficiency of other specified B group vitamins: Secondary | ICD-10-CM | POA: Diagnosis not present

## 2023-01-03 DIAGNOSIS — E1169 Type 2 diabetes mellitus with other specified complication: Secondary | ICD-10-CM | POA: Diagnosis not present

## 2023-01-03 DIAGNOSIS — E785 Hyperlipidemia, unspecified: Secondary | ICD-10-CM | POA: Diagnosis not present

## 2023-01-03 DIAGNOSIS — I10 Essential (primary) hypertension: Secondary | ICD-10-CM

## 2023-01-03 LAB — POCT GLYCOSYLATED HEMOGLOBIN (HGB A1C): Hemoglobin A1C: 7.1 % — AB (ref 4.0–5.6)

## 2023-01-03 MED ORDER — PIOGLITAZONE HCL 15 MG PO TABS: 15.0000 mg | ORAL_TABLET | Freq: Every day | ORAL | 1 refills | Status: DC

## 2023-01-03 MED ORDER — LISINOPRIL 20 MG PO TABS: 20.0000 mg | ORAL_TABLET | Freq: Every day | ORAL | 0 refills | Status: DC

## 2023-01-05 LAB — COMPLETE METABOLIC PANEL WITH GFR
AG Ratio: 1.3 (calc) (ref 1.0–2.5)
ALT: 12 U/L (ref 6–29)
AST: 14 U/L (ref 10–35)
Albumin: 4.1 g/dL (ref 3.6–5.1)
Alkaline phosphatase (APISO): 87 U/L (ref 37–153)
BUN: 22 mg/dL (ref 7–25)
CO2: 25 mmol/L (ref 20–32)
Calcium: 9.7 mg/dL (ref 8.6–10.4)
Chloride: 107 mmol/L (ref 98–110)
Creat: 0.81 mg/dL (ref 0.60–1.00)
Globulin: 3.1 g/dL (calc) (ref 1.9–3.7)
Glucose, Bld: 97 mg/dL (ref 65–99)
Potassium: 4.7 mmol/L (ref 3.5–5.3)
Sodium: 143 mmol/L (ref 135–146)
Total Bilirubin: 0.3 mg/dL (ref 0.2–1.2)
Total Protein: 7.2 g/dL (ref 6.1–8.1)
eGFR: 75 mL/min/{1.73_m2} (ref >=60)

## 2023-01-05 LAB — CBC WITH DIFFERENTIAL/PLATELET
Absolute Monocytes: 496 cells/uL (ref 200–950)
Basophils Absolute: 62 cells/uL (ref 0–200)
Basophils Relative: 1 %
Eosinophils Absolute: 149 cells/uL (ref 15–500)
Eosinophils Relative: 2.4 %
HCT: 35.6 % (ref 35.0–45.0)
Hemoglobin: 11.9 g/dL (ref 11.7–15.5)
Lymphs Abs: 1854 cells/uL (ref 850–3900)
MCH: 29.2 pg (ref 27.0–33.0)
MCHC: 33.4 g/dL (ref 32.0–36.0)
MCV: 87.3 fL (ref 80.0–100.0)
MPV: 10.4 fL (ref 7.5–12.5)
Monocytes Relative: 8 %
Neutro Abs: 3639 cells/uL (ref 1500–7800)
Neutrophils Relative %: 58.7 %
Platelets: 311 10*3/uL (ref 140–400)
RBC: 4.08 10*6/uL (ref 3.80–5.10)
RDW: 13.8 % (ref 11.0–15.0)
Total Lymphocyte: 29.9 %
WBC: 6.2 10*3/uL (ref 3.8–10.8)

## 2023-01-05 LAB — LIPID PANEL
Cholesterol: 129 mg/dL (ref <200)
HDL: 68 mg/dL (ref >=50)
LDL Cholesterol (Calc): 45 mg/dL (calc)
Non-HDL Cholesterol (Calc): 61 mg/dL (calc) (ref <130)
Total CHOL/HDL Ratio: 1.9 (calc) (ref <5.0)
Triglycerides: 81 mg/dL (ref <150)

## 2023-01-05 LAB — B12 AND FOLATE PANEL
Folate: >24 ng/mL
Vitamin B-12: 713 pg/mL (ref 200–1100)

## 2023-01-05 LAB — VITAMIN D 25 HYDROXY (VIT D DEFICIENCY, FRACTURES): Vit D, 25-Hydroxy: 43 ng/mL (ref 30–100)

## 2023-01-05 LAB — MICROALBUMIN / CREATININE URINE RATIO
Creatinine, Urine: 36 mg/dL (ref 20–275)
Microalb Creat Ratio: 11 mcg/mg creat (ref <30)
Microalb, Ur: 0.4 mg/dL

## 2023-01-05 LAB — CEA: CEA: 4.7 ng/mL — ABNORMAL HIGH

## 2023-01-07 ENCOUNTER — Ambulatory Visit: Payer: Medicare PPO

## 2023-01-07 DIAGNOSIS — E1159 Type 2 diabetes mellitus with other circulatory complications: Secondary | ICD-10-CM

## 2023-01-07 NOTE — Progress Notes (Signed)
Patient here for follow up blood pressure, at last visit was on low side of normal and patient was feeling dizzy.  Dr. Ancil Boozer decreased Lisinopril to 20mg .  Patient denies any side effects.  Blood pressure today is 130/64.  Patient told to continue current regimen.

## 2023-01-08 ENCOUNTER — Other Ambulatory Visit: Payer: Self-pay | Admitting: Family Medicine

## 2023-01-10 ENCOUNTER — Other Ambulatory Visit: Payer: Self-pay

## 2023-01-10 MED ORDER — ALBUTEROL SULFATE HFA 108 (90 BASE) MCG/ACT IN AERS: INHALATION_SPRAY | RESPIRATORY_TRACT | 0 refills | Status: DC

## 2023-01-11 ENCOUNTER — Other Ambulatory Visit: Payer: Medicare PPO

## 2023-01-11 ENCOUNTER — Ambulatory Visit: Payer: Medicare PPO | Admitting: Family Medicine

## 2023-01-14 ENCOUNTER — Ambulatory Visit: Payer: Medicare PPO | Admitting: Internal Medicine

## 2023-01-14 ENCOUNTER — Ambulatory Visit: Payer: Medicare HMO

## 2023-01-18 ENCOUNTER — Ambulatory Visit: Payer: Medicare PPO | Admitting: Family Medicine

## 2023-02-02 DIAGNOSIS — I10 Essential (primary) hypertension: Secondary | ICD-10-CM | POA: Diagnosis not present

## 2023-02-02 DIAGNOSIS — R600 Localized edema: Secondary | ICD-10-CM | POA: Diagnosis not present

## 2023-02-02 DIAGNOSIS — E119 Type 2 diabetes mellitus without complications: Secondary | ICD-10-CM | POA: Diagnosis not present

## 2023-02-02 DIAGNOSIS — E782 Mixed hyperlipidemia: Secondary | ICD-10-CM | POA: Diagnosis not present

## 2023-02-03 ENCOUNTER — Ambulatory Visit: Payer: Medicare PPO | Admitting: Family Medicine

## 2023-02-04 ENCOUNTER — Encounter: Payer: Self-pay | Admitting: Internal Medicine

## 2023-02-04 ENCOUNTER — Other Ambulatory Visit: Payer: Self-pay

## 2023-02-04 ENCOUNTER — Inpatient Hospital Stay: Payer: Medicare PPO | Attending: Internal Medicine | Admitting: Internal Medicine

## 2023-02-04 ENCOUNTER — Telehealth: Payer: Self-pay

## 2023-02-04 VITALS — BP 148/83 | HR 80 | Temp 96.0°F | Resp 18 | Wt 223.4 lb

## 2023-02-04 DIAGNOSIS — R42 Dizziness and giddiness: Secondary | ICD-10-CM | POA: Insufficient documentation

## 2023-02-04 DIAGNOSIS — D649 Anemia, unspecified: Secondary | ICD-10-CM | POA: Insufficient documentation

## 2023-02-04 DIAGNOSIS — C182 Malignant neoplasm of ascending colon: Secondary | ICD-10-CM | POA: Diagnosis not present

## 2023-02-04 DIAGNOSIS — Z85038 Personal history of other malignant neoplasm of large intestine: Secondary | ICD-10-CM

## 2023-02-04 DIAGNOSIS — E785 Hyperlipidemia, unspecified: Secondary | ICD-10-CM | POA: Diagnosis not present

## 2023-02-04 DIAGNOSIS — E119 Type 2 diabetes mellitus without complications: Secondary | ICD-10-CM | POA: Diagnosis not present

## 2023-02-04 DIAGNOSIS — Z803 Family history of malignant neoplasm of breast: Secondary | ICD-10-CM | POA: Insufficient documentation

## 2023-02-04 DIAGNOSIS — M2550 Pain in unspecified joint: Secondary | ICD-10-CM | POA: Diagnosis not present

## 2023-02-04 DIAGNOSIS — J449 Chronic obstructive pulmonary disease, unspecified: Secondary | ICD-10-CM | POA: Diagnosis not present

## 2023-02-04 DIAGNOSIS — K219 Gastro-esophageal reflux disease without esophagitis: Secondary | ICD-10-CM | POA: Diagnosis not present

## 2023-02-04 DIAGNOSIS — G8929 Other chronic pain: Secondary | ICD-10-CM | POA: Diagnosis not present

## 2023-02-04 DIAGNOSIS — I10 Essential (primary) hypertension: Secondary | ICD-10-CM | POA: Diagnosis not present

## 2023-02-04 DIAGNOSIS — Z800 Family history of malignant neoplasm of digestive organs: Secondary | ICD-10-CM | POA: Diagnosis not present

## 2023-02-04 DIAGNOSIS — Z79899 Other long term (current) drug therapy: Secondary | ICD-10-CM | POA: Diagnosis not present

## 2023-02-04 DIAGNOSIS — R970 Elevated carcinoembryonic antigen [CEA]: Secondary | ICD-10-CM | POA: Insufficient documentation

## 2023-02-04 DIAGNOSIS — Z7982 Long term (current) use of aspirin: Secondary | ICD-10-CM | POA: Insufficient documentation

## 2023-02-04 DIAGNOSIS — Z7984 Long term (current) use of oral hypoglycemic drugs: Secondary | ICD-10-CM | POA: Insufficient documentation

## 2023-02-04 MED ORDER — NA SULFATE-K SULFATE-MG SULF 17.5-3.13-1.6 GM/177ML PO SOLN: 1.0000 | Freq: Once | ORAL | 0 refills | Status: AC

## 2023-02-04 NOTE — Progress Notes (Signed)
Patient has been feeling dizzy last few days and does have vertigo.  Has not taken her medications for vertigo yet but may take when she gets home  BP 148/83, HR 80

## 2023-02-04 NOTE — Telephone Encounter (Signed)
Gastroenterology Pre-Procedure Review  Request Date: 02/24/23 Requesting Physician: Dr. Vicente Males  PATIENT REVIEW QUESTIONS: The patient responded to the following health history questions as indicated:    Cardiac clearance sent to Janice Coffin PA   1. Are you having any GI issues? no 2. Do you have a personal history of Polyps? yes (last colonoscopy performed by Dr. Vicente Males 01/22/22 polyps were present, patient has history of colon cancer) 3. Do you have a family history of Colon Cancer or Polyps? no 4. Diabetes Mellitus? yes (patient takes Trulicity on Wednesdays has been advised to stop it on 02/17/23, Also takes Metformin has been advised to stop on 02/22/23, and stop Actos on 02/23/23.) 5. Joint replacements in the past 12 months?no 6. Major health problems in the past 3 months?no 7. Any artificial heart valves, MVP, or defibrillator?no    MEDICATIONS & ALLERGIES:    Patient reports the following regarding taking any anticoagulation/antiplatelet therapy:   Plavix, Coumadin, Eliquis, Xarelto, Lovenox, Pradaxa, Brilinta, or Effient? no Aspirin?'81mg'$  daily  Patient confirms/reports the following medications:  Current Outpatient Medications  Medication Sig Dispense Refill   albuterol (VENTOLIN HFA) 108 (90 Base) MCG/ACT inhaler INHALE 2 PUFFS EVERY 6 HOURS AS NEEDED FOR WHEEZING OR SHORTNESS OF BREATH 2 each 0   Ascorbic Acid (VITAMIN C) 1000 MG tablet Take 1,000 mg by mouth daily.     aspirin EC 81 MG tablet Take 1 tablet (81 mg total) by mouth daily. 90 tablet 1   atorvastatin (LIPITOR) 80 MG tablet TAKE 1 TABLET EVERY DAY 90 tablet 3   BD PEN NEEDLE MICRO U/F 32G X 6 MM MISC USE ONCE DAILY 100 each 2   blood glucose meter kit and supplies KIT Dispense based on patient and insurance preference. Use to monitor blood sugars once daily as directed. 1 each 0   carvedilol (COREG) 6.25 MG tablet Take 6.25 mg by mouth daily.     Cholecalciferol (VITAMIN D-3) 1000 units CAPS Take 1,000  Units by mouth daily.      Dulaglutide (TRULICITY) 3 0000000 SOPN Inject 3 mg as directed once a week. Patient receives through [employer] Patient Assistance through Dec 2023     famotidine (PEPCID) 20 MG tablet TAKE 1 TABLET TWICE DAILY 180 tablet 3   fluticasone (FLONASE) 50 MCG/ACT nasal spray USE 2 SPRAYS IN EACH NOSTRIL EVERY DAY 48 g 0   furosemide (LASIX) 20 MG tablet TAKE 1 TABLET EVERY DAY 90 tablet 1   Ginger, Zingiber officinalis, (GINGER PO) Take by mouth. Patient reports taking OTC Ginger for nausea relief     Glycopyrrolate-Formoterol (BEVESPI AEROSPHERE) 9-4.8 MCG/ACT AERO INHALE 2 PUFFS BY MOUTH AT BEDTIME. Patient receives through [employer] Patient Assistance 11 g 11   Lancets (ONETOUCH DELICA PLUS [id number]) MISC CHECK GLUCOSE THREE TIMES DAILY     lisinopril (ZESTRIL) 20 MG tablet Take 1 tablet (20 mg total) by mouth at bedtime. 90 tablet 0   metFORMIN (GLUCOPHAGE-XR) 750 MG 24 hr tablet TAKE 2 TABLETS (1,500 MG TOTAL) DAILY WITH BREAKFAST. 180 tablet 3   Multiple Vitamin (MULTIVITAMIN) tablet Take 1 tablet by mouth daily.     Na Sulfate-K Sulfate-Mg Sulf 17.5-3.13-1.6 GM/177ML SOLN Take 1 kit by mouth once for 1 dose. 354 mL 0   Omega-3 1000 MG CAPS Take by mouth.     pioglitazone (ACTOS) 15 MG tablet Take 1 tablet (15 mg total) by mouth daily. 90 tablet 1   polyethylene glycol powder (GLYCOLAX/MIRALAX) 17 GM/SCOOP powder Take 1 Container  by mouth as needed. Taking 3 times a week     pregabalin (LYRICA) 75 MG capsule Take 1 capsule (75 mg total) by mouth 2 (two) times daily. 180 capsule 1   scopolamine (TRANSDERM-SCOP) 1 MG/3DAYS 1 patch every 3 (three) days.     tiZANidine (ZANAFLEX) 2 MG tablet 1 po tid prn     traMADol (ULTRAM) 50 MG tablet Take 25-50 mg by mouth 2 (two) times daily as needed.     vitamin B-12 (CYANOCOBALAMIN) 500 MCG tablet Take 1,000 mcg by mouth daily.     No current facility-administered medications for this visit.    Patient confirms/reports the  following allergies:  Allergies  Allergen Reactions   Doxycycline Rash    Itching    No orders of the defined types were placed in this encounter.   AUTHORIZATION INFORMATION Primary Insurance: 1D#: Group #:  Secondary Insurance: 1D#: Group #:  SCHEDULE INFORMATION: Date: 02/24/23 Time: Location: [facility]

## 2023-02-04 NOTE — Assessment & Plan Note (Addendum)
#   Colon cancer-of ascending colon- stage I; MSI mismatch/sporadic; Excellent prognosis more than 90% chance of cure [see discussion below regarding elevated CEA]; January 2021-CT abdomen pelvis negative for any recurrence. Stable- Follow-up EGD/colonoscopy-  [id number] polyps; again reminded re: colonoscopy [Dr.Anna]   #Abnormal CEA-around AUG 2023- 5-7 ; FEB 2024- 4.7- improved- overall - stable.Low clinical suspicion of any recurrent malignancy.  I again discussed the potential etiologies include autoimmune disease; liver disease; smoking etc.   # Hx of chronic vertigo- [s/p ENT]- reminded to take patch/pills; and falls precautions.   # DISPOSITION:  # follow up in 6 months- MD: labs- cbc/cmp; CEA 2-3 days prior--Dr.B
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
## 2023-02-14 ENCOUNTER — Telehealth: Payer: Self-pay

## 2023-02-14 NOTE — Telephone Encounter (Signed)
Cardiac Clearance granted for scheduled colonoscopy 02/24/23. Clearance obtained via fax from Janice Coffin PA.  Thanks, [place], [place]

## 2023-02-17 ENCOUNTER — Other Ambulatory Visit: Payer: Self-pay | Admitting: Family Medicine

## 2023-02-18 DIAGNOSIS — M654 Radial styloid tenosynovitis [de Quervain]: Secondary | ICD-10-CM | POA: Diagnosis not present

## 2023-02-18 DIAGNOSIS — M1812 Unilateral primary osteoarthritis of first carpometacarpal joint, left hand: Secondary | ICD-10-CM | POA: Diagnosis not present

## 2023-02-21 ENCOUNTER — Telehealth: Payer: Self-pay | Admitting: Gastroenterology

## 2023-02-21 NOTE — Telephone Encounter (Signed)
Patient left a voicemail has a question about the allergy medication and Aspirin 81mg  if she needs to take the medication before colonoscopy.

## 2023-02-21 NOTE — Telephone Encounter (Signed)
Patient has been advised that she can her allergy medication and aspirin 81 mg up til the morning of her procedure.  She may also take her pain medication today and tomorrow if needed.  Thanks, Sharyn Lull CMA

## 2023-02-21 NOTE — Telephone Encounter (Signed)
Patient calling with a few questions about medications before her colonoscopy. Requesting call back.

## 2023-02-23 ENCOUNTER — Encounter: Payer: Self-pay | Admitting: Gastroenterology
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
## 2023-02-24 ENCOUNTER — Ambulatory Visit: Payer: Medicare PPO | Admitting: Anesthesiology

## 2023-02-24 ENCOUNTER — Encounter: Payer: Self-pay | Admitting: Gastroenterology

## 2023-02-24 ENCOUNTER — Ambulatory Visit
Admission: RE | Admit: 2023-02-24 | Discharge: 2023-02-24 | Disposition: A | Discharge status: Home / Self Care | Payer: Medicare PPO | Attending: Gastroenterology | Admitting: Gastroenterology

## 2023-02-24 ENCOUNTER — Other Ambulatory Visit: Payer: Self-pay

## 2023-02-24 ENCOUNTER — Other Ambulatory Visit: Payer: Self-pay | Admitting: Gastroenterology

## 2023-02-24 ENCOUNTER — Encounter
Admission: RE | Disposition: A | Discharge status: Home / Self Care | Payer: Self-pay | Source: Home / Self Care | Attending: Gastroenterology

## 2023-02-24 DIAGNOSIS — Z79899 Other long term (current) drug therapy: Secondary | ICD-10-CM | POA: Diagnosis not present

## 2023-02-24 DIAGNOSIS — I2510 Atherosclerotic heart disease of native coronary artery without angina pectoris: Secondary | ICD-10-CM | POA: Diagnosis not present

## 2023-02-24 DIAGNOSIS — G8929 Other chronic pain: Secondary | ICD-10-CM | POA: Insufficient documentation

## 2023-02-24 DIAGNOSIS — Z85038 Personal history of other malignant neoplasm of large intestine: Secondary | ICD-10-CM | POA: Diagnosis not present

## 2023-02-24 DIAGNOSIS — Z86010 Personal history of colonic polyps: Secondary | ICD-10-CM | POA: Diagnosis not present

## 2023-02-24 DIAGNOSIS — E785 Hyperlipidemia, unspecified: Secondary | ICD-10-CM | POA: Insufficient documentation

## 2023-02-24 DIAGNOSIS — E119 Type 2 diabetes mellitus without complications: Secondary | ICD-10-CM | POA: Diagnosis not present

## 2023-02-24 DIAGNOSIS — D125 Benign neoplasm of sigmoid colon: Secondary | ICD-10-CM

## 2023-02-24 DIAGNOSIS — I10 Essential (primary) hypertension: Secondary | ICD-10-CM | POA: Insufficient documentation

## 2023-02-24 DIAGNOSIS — K219 Gastro-esophageal reflux disease without esophagitis: Secondary | ICD-10-CM | POA: Insufficient documentation

## 2023-02-24 DIAGNOSIS — J449 Chronic obstructive pulmonary disease, unspecified: Secondary | ICD-10-CM | POA: Diagnosis not present

## 2023-02-24 DIAGNOSIS — D126 Benign neoplasm of colon, unspecified: Secondary | ICD-10-CM

## 2023-02-24 DIAGNOSIS — Z6837 Body mass index (BMI) 37.0-37.9, adult: Secondary | ICD-10-CM | POA: Insufficient documentation

## 2023-02-24 DIAGNOSIS — E669 Obesity, unspecified: Secondary | ICD-10-CM | POA: Insufficient documentation

## 2023-02-24 DIAGNOSIS — Z1211 Encounter for screening for malignant neoplasm of colon: Secondary | ICD-10-CM | POA: Diagnosis not present

## 2023-02-24 DIAGNOSIS — K635 Polyp of colon: Secondary | ICD-10-CM | POA: Diagnosis not present

## 2023-02-24 DIAGNOSIS — M549 Dorsalgia, unspecified: Secondary | ICD-10-CM | POA: Insufficient documentation

## 2023-02-24 DIAGNOSIS — Z8542 Personal history of malignant neoplasm of other parts of uterus: Secondary | ICD-10-CM | POA: Diagnosis not present

## 2023-02-24 HISTORY — PX: COLONOSCOPY WITH PROPOFOL: SHX5780

## 2023-02-24 LAB — GLUCOSE, CAPILLARY: Glucose-Capillary: 134 mg/dL — ABNORMAL HIGH (ref 70–99)

## 2023-02-24 SURGERY — COLONOSCOPY WITH PROPOFOL
Anesthesia: General

## 2023-02-24 MED ORDER — NA SULFATE-K SULFATE-MG SULF 17.5-3.13-1.6 GM/177ML PO SOLN: 354.0000 mL | Freq: Once | ORAL | 0 refills | Status: AC

## 2023-02-24 MED ORDER — LIDOCAINE HCL (CARDIAC) PF 100 MG/5ML IV SOSY
PREFILLED_SYRINGE | INTRAVENOUS | Status: DC | PRN
  Administered 2023-02-24: 20 mg via INTRAVENOUS

## 2023-02-24 MED ORDER — PROPOFOL 10 MG/ML IV BOLUS
INTRAVENOUS | Status: AC
  Filled 2023-02-24: qty 40

## 2023-02-24 MED ORDER — SODIUM CHLORIDE 0.9 % IV SOLN: INTRAVENOUS | Status: DC

## 2023-02-24 MED ORDER — PROPOFOL 10 MG/ML IV BOLUS
INTRAVENOUS | Status: DC | PRN
  Administered 2023-02-24: 105 ug/kg/min via INTRAVENOUS
  Administered 2023-02-24: 100 mg via INTRAVENOUS

## 2023-02-24 NOTE — Addendum Note (Signed)
Addended by: Wayna Chalet on: 02/24/2023 11:48 AM   Modules accepted: Orders

## 2023-02-24 NOTE — Op Note (Signed)
[facility] Gastroenterology Patient Name: Cindy Griffin Procedure Date: 02/24/2023 9:00 AM MRN: [id number] Account #: [id number] Date of Birth: 1946/02/21 Admit Type: Outpatient Age: 77 Room: [facility] ENDO ROOM 3 Gender: Female Note Status: Finalized Instrument Name: Jasper Riling [id number] Procedure:             Colonoscopy Indications:           Surveillance: Personal history of adenomatous polyps                         on last colonoscopy > 3 years ago Providers:             [name] MD, MD Referring MD:          Bethena Roys. Sowles, MD (Referring MD) Medicines:             Monitored Anesthesia Care Complications:         No immediate complications. Procedure:             Pre-Anesthesia Assessment:                        - Prior to the procedure, a History and Physical was                         performed, and patient medications, allergies and                         sensitivities were reviewed. The patient's tolerance                         of previous anesthesia was reviewed.                        - The risks and benefits of the procedure and the                         sedation options and risks were discussed with the                         patient. All questions were answered and informed                         consent was obtained.                        - ASA Grade Assessment: II - A patient with mild                         systemic disease.                        After obtaining informed consent, the colonoscope was                         passed under direct vision. Throughout the procedure,                         the patient's blood pressure, pulse, and oxygen  saturations were monitored continuously. The                         Colonoscope was introduced through the anus and                         advanced to the the cecum, identified by the                         appendiceal orifice. The colonoscopy was performed                          with ease. The patient tolerated the procedure well.                         The quality of the bowel preparation was                         unsatisfactory. The ileocecal valve, appendiceal                         orifice, and rectum were photographed. Findings:      The perianal and digital rectal examinations were normal.      A 5 mm polyp was found in the sigmoid colon. The polyp was sessile. The       polyp was removed with a cold snare. Resection and retrieval were       complete.      A moderate amount of stool was found in the entire colon, interfering       with visualization. Impression:            - Preparation of the colon was unsatisfactory.                        - One 5 mm polyp in the sigmoid colon, removed with a                         cold snare. Resected and retrieved.                        - Stool in the entire examined colon. Recommendation:        - Discharge patient to home (with escort).                        - Resume previous diet.                        - Continue present medications.                        - Repeat colonoscopy tomorrow because the bowel                         preparation was suboptimal. Procedure Code(s):     --- Professional ---                        [phone], Colonoscopy, flexible; with removal of  tumor(s), polyp(s), or other lesion(s) by snare                         technique Diagnosis Code(s):     --- Professional ---                        Z86.010, Personal history of colonic polyps                        D12.5, Benign neoplasm of sigmoid colon CPT copyright 2022 American Medical Association. All rights reserved. The codes documented in this report are preliminary and upon coder review may  be revised to meet current compliance requirements. [name], MD [name] MD, MD 02/24/2023 9:32:52 AM This report has been signed electronically. Number of Addenda: 0 Note Initiated On: 02/24/2023 9:00  AM Scope Withdrawal Time: 0 hours 1 minute 57 seconds  Total Procedure Duration: 0 hours 6 minutes 42 seconds  Estimated Blood Loss:  Estimated blood loss: none.      [facility]

## 2023-02-24 NOTE — Anesthesia Postprocedure Evaluation (Signed)
Anesthesia Post Note  Patient: [name]  Procedure(s) Performed: COLONOSCOPY WITH PROPOFOL  Patient location during evaluation: Endoscopy Anesthesia Type: General Level of consciousness: awake and alert Pain management: pain level controlled Vital Signs Assessment: post-procedure vital signs reviewed and stable Respiratory status: spontaneous breathing, nonlabored ventilation and respiratory function stable Cardiovascular status: blood pressure returned to baseline and stable Postop Assessment: no apparent nausea or vomiting Anesthetic complications: no   No notable events documented.   Last Vitals:  Vitals:   02/24/23 0934 02/24/23 1002  BP: 110/76 (!) 142/74  Pulse: 87 70  Resp: 20 16  Temp:    SpO2: 99% 98%    Last Pain:  Vitals:   02/24/23 1002  TempSrc:   PainSc: 0-No pain                 [place] Ouch
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
## 2023-02-24 NOTE — Transfer of Care (Signed)
Immediate Anesthesia Transfer of Care Note  Patient: Cindy Griffin  Procedure(s) Performed: COLONOSCOPY WITH PROPOFOL  Patient Location: PACU  Anesthesia Type:General  Level of Consciousness: drowsy  Airway & Oxygen Therapy: Patient Spontanous Breathing  Post-op Assessment: Report given to RN and Post -op Vital signs reviewed and stable  Post vital signs: Reviewed and stable  Last Vitals:  Vitals Value Taken Time  BP 110/65 0934  Temp 35.8 0934  Pulse 84 0934  Resp 16 0934  SpO2 96 0934    Last Pain:  Vitals:   02/24/23 0848  TempSrc: Temporal  PainSc: 0-No pain         Complications: No notable events documented.

## 2023-02-25 ENCOUNTER — Other Ambulatory Visit: Payer: Self-pay | Admitting: Family Medicine

## 2023-02-25 ENCOUNTER — Ambulatory Visit
Admission: RE | Admit: 2023-02-25 | Discharge: 2023-02-25 | Disposition: A | Discharge status: Home / Self Care | Payer: Medicare PPO | Attending: Gastroenterology | Admitting: Gastroenterology

## 2023-02-25 ENCOUNTER — Ambulatory Visit: Payer: Medicare PPO | Admitting: Anesthesiology

## 2023-02-25 ENCOUNTER — Encounter
Admission: RE | Disposition: A | Discharge status: Home / Self Care | Payer: Self-pay | Source: Home / Self Care | Attending: Gastroenterology

## 2023-02-25 DIAGNOSIS — I10 Essential (primary) hypertension: Secondary | ICD-10-CM | POA: Diagnosis not present

## 2023-02-25 DIAGNOSIS — D123 Benign neoplasm of transverse colon: Secondary | ICD-10-CM | POA: Insufficient documentation

## 2023-02-25 DIAGNOSIS — Z1211 Encounter for screening for malignant neoplasm of colon: Secondary | ICD-10-CM | POA: Diagnosis not present

## 2023-02-25 DIAGNOSIS — D122 Benign neoplasm of ascending colon: Secondary | ICD-10-CM | POA: Diagnosis not present

## 2023-02-25 DIAGNOSIS — I2510 Atherosclerotic heart disease of native coronary artery without angina pectoris: Secondary | ICD-10-CM | POA: Insufficient documentation

## 2023-02-25 DIAGNOSIS — E119 Type 2 diabetes mellitus without complications: Secondary | ICD-10-CM | POA: Insufficient documentation

## 2023-02-25 DIAGNOSIS — J449 Chronic obstructive pulmonary disease, unspecified: Secondary | ICD-10-CM | POA: Insufficient documentation

## 2023-02-25 DIAGNOSIS — D120 Benign neoplasm of cecum: Secondary | ICD-10-CM

## 2023-02-25 DIAGNOSIS — K219 Gastro-esophageal reflux disease without esophagitis: Secondary | ICD-10-CM | POA: Diagnosis not present

## 2023-02-25 DIAGNOSIS — D126 Benign neoplasm of colon, unspecified: Secondary | ICD-10-CM

## 2023-02-25 DIAGNOSIS — D124 Benign neoplasm of descending colon: Secondary | ICD-10-CM | POA: Diagnosis not present

## 2023-02-25 DIAGNOSIS — Z86010 Personal history of colonic polyps: Secondary | ICD-10-CM

## 2023-02-25 DIAGNOSIS — Z85038 Personal history of other malignant neoplasm of large intestine: Secondary | ICD-10-CM | POA: Diagnosis not present

## 2023-02-25 DIAGNOSIS — Z09 Encounter for follow-up examination after completed treatment for conditions other than malignant neoplasm: Secondary | ICD-10-CM | POA: Insufficient documentation

## 2023-02-25 DIAGNOSIS — K635 Polyp of colon: Secondary | ICD-10-CM | POA: Diagnosis not present

## 2023-02-25 HISTORY — PX: COLONOSCOPY WITH PROPOFOL: SHX5780

## 2023-02-25 LAB — SURGICAL PATHOLOGY

## 2023-02-25 LAB — GLUCOSE, CAPILLARY: Glucose-Capillary: 125 mg/dL — ABNORMAL HIGH (ref 70–99)

## 2023-02-25 SURGERY — COLONOSCOPY WITH PROPOFOL
Anesthesia: General

## 2023-02-25 MED ORDER — PROPOFOL 10 MG/ML IV BOLUS
INTRAVENOUS | Status: AC
  Filled 2023-02-25: qty 20

## 2023-02-25 MED ORDER — SODIUM CHLORIDE 0.9 % IV SOLN
INTRAVENOUS | Status: DC
  Administered 2023-02-25: 20 mL/h via INTRAVENOUS

## 2023-02-25 MED ORDER — PROPOFOL 500 MG/50ML IV EMUL
INTRAVENOUS | Status: DC | PRN
  Administered 2023-02-25: 150 ug/kg/min via INTRAVENOUS

## 2023-02-25 NOTE — Transfer of Care (Signed)
Immediate Anesthesia Transfer of Care Note  Patient: Cindy Griffin  Procedure(s) Performed: COLONOSCOPY WITH PROPOFOL  Patient Location: PACU  Anesthesia Type:General  Level of Consciousness: awake and sedated  Airway & Oxygen Therapy: Patient Spontanous Breathing and Patient connected to face mask oxygen  Post-op Assessment: Report given to RN and Post -op Vital signs reviewed and stable  Post vital signs: Reviewed and stable  Last Vitals:  Vitals Value Taken Time  BP 117/72 02/25/23 0928  Temp 35.6 C 02/25/23 0928  Pulse 87 02/25/23 0928  Resp 18 02/25/23 0928  SpO2 100 % 02/25/23 0928    Last Pain:  Vitals:   02/25/23 0928  TempSrc: Temporal  PainSc: Asleep         Complications: There were no known notable events for this encounter.
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
## 2023-02-25 NOTE — Anesthesia Postprocedure Evaluation (Signed)
Anesthesia Post Note  Patient: Cindy Griffin  Procedure(s) Performed: COLONOSCOPY WITH PROPOFOL  Patient location during evaluation: Endoscopy Anesthesia Type: General Level of consciousness: awake and alert Pain management: pain level controlled Vital Signs Assessment: post-procedure vital signs reviewed and stable Respiratory status: spontaneous breathing, nonlabored ventilation, respiratory function stable and patient connected to nasal cannula oxygen Cardiovascular status: blood pressure returned to baseline and stable Postop Assessment: no apparent nausea or vomiting Anesthetic complications: no   There were no known notable events for this encounter.   Last Vitals:  Vitals:   02/25/23 0928 02/25/23 0938  BP: 117/72 120/82  Pulse: 87 80  Resp: 18 16  Temp: (!) 35.6 C   SpO2: 100% 98%    Last Pain:  Vitals:   02/25/23 0938  TempSrc:   PainSc: 0-No pain                 Ilene Qua

## 2023-02-25 NOTE — Op Note (Signed)
[facility] Gastroenterology Patient Name: Cindy Griffin Procedure Date: 02/25/2023 8:57 AM MRN: [id number] Account #: [id number] Date of Birth: 01-22-1946 Admit Type: Outpatient Age: 77 Room: [facility] ENDO ROOM 1 Gender: Female Note Status: Finalized Instrument Name: Jasper Riling [id number] Procedure:             Colonoscopy Indications:           Surveillance: Personal history of adenomatous polyps                         on last colonoscopy > 3 years ago, Last colonoscopy:                         March 2024 Providers:             [name] MD, MD Medicines:             Monitored Anesthesia Care Complications:         No immediate complications. Procedure:             Pre-Anesthesia Assessment:                        - Prior to the procedure, a History and Physical was                         performed, and patient medications, allergies and                         sensitivities were reviewed. The patient's tolerance                         of previous anesthesia was reviewed.                        - The risks and benefits of the procedure and the                         sedation options and risks were discussed with the                         patient. All questions were answered and informed                         consent was obtained.                        - ASA Grade Assessment: II - A patient with mild                         systemic disease.                        After obtaining informed consent, the colonoscope was                         passed under direct vision. Throughout the procedure,                         the patient's blood pressure, pulse, and oxygen  saturations were monitored continuously. The                         Colonoscope was introduced through the anus and                         advanced to the the cecum, identified by the                         appendiceal orifice. The colonoscopy was performed                          with ease. The patient tolerated the procedure well.                         The quality of the bowel preparation was good. The                         ileocecal valve, appendiceal orifice, and rectum were                         photographed. Findings:      The perianal and digital rectal examinations were normal.      Four sessile polyps were found in the descending colon. The polyps were       5 to 6 mm in size. These polyps were removed with a cold snare.       Resection and retrieval were complete.      Two sessile polyps were found in the ascending colon and cecum. The       polyps were 4 to 5 mm in size. These polyps were removed with a cold       snare. Resection and retrieval were complete.      A 5 mm polyp was found in the transverse colon. The polyp was sessile.       The polyp was removed with a cold snare. Resection and retrieval were       complete.      A 3 mm polyp was found in the transverse colon. The polyp was sessile.       The polyp was removed with a jumbo cold forceps. Resection and retrieval       were complete.      The exam was otherwise without abnormality on direct and retroflexion       views. Impression:            - Four 5 to 6 mm polyps in the descending colon,                         removed with a cold snare. Resected and retrieved.                        - Two 4 to 5 mm polyps in the ascending colon and in                         the cecum, removed with a cold snare. Resected and                         retrieved.                        -  One 5 mm polyp in the transverse colon, removed with                         a cold snare. Resected and retrieved.                        - One 3 mm polyp in the transverse colon, removed with                         a jumbo cold forceps. Resected and retrieved.                        - The examination was otherwise normal on direct and                         retroflexion views. Recommendation:        -  Discharge patient to home (with escort).                        - Resume previous diet.                        - Continue present medications.                        - Await pathology results.                        - Repeat colonoscopy is not recommended due to current                         age (7 years or older) for surveillance based on                         pathology results. Procedure Code(s):     --- Professional ---                        [id number], Colonoscopy, flexible; with removal of                         tumor(s), polyp(s), or other lesion(s) by snare                         technique                        45380, 48, Colonoscopy, flexible; with biopsy, single                         or multiple Diagnosis Code(s):     --- Professional ---                        Z86.010, Personal history of colonic polyps                        D12.4, Benign neoplasm of descending colon                        D12.2, Benign neoplasm of ascending colon  D12.0, Benign neoplasm of cecum                        D12.3, Benign neoplasm of transverse colon (hepatic                         flexure or splenic flexure) CPT copyright 2022 American Medical Association. All rights reserved. The codes documented in this report are preliminary and upon coder review may  be revised to meet current compliance requirements. [name], MD [name] MD, MD 02/25/2023 9:29:05 AM This report has been signed electronically. Number of Addenda: 0 Note Initiated On: 02/25/2023 8:57 AM Scope Withdrawal Time: 0 hours 13 minutes 7 seconds  Total Procedure Duration: 0 hours 17 minutes 16 seconds  Estimated Blood Loss:  Estimated blood loss: none.      [facility]

## 2023-02-28 ENCOUNTER — Encounter: Payer: Self-pay | Admitting: Gastroenterology

## 2023-02-28 LAB — SURGICAL PATHOLOGY

## 2023-03-01 ENCOUNTER — Telehealth: Payer: Self-pay

## 2023-03-01 NOTE — Progress Notes (Signed)
Care Management & Coordination Services Pharmacy Team  Reason for Encounter: Diabetes  Contacted patient to discuss diabetes disease state.  Spoke with patient on 03/01/2023   Current antihyperglycemic regimen:  Trulicity 3 mg weekly  Metformin XR 750 mg 2 tablets daily     Patient verbally confirms she is taking the above medications as directed. Yes  What recent interventions/DTPs have been made to improve glycemic control:  Patient states she has been more active with chores around the house.Patient states she mowed her grass today which she has not done that in years.  Have there been any recent hospitalizations or ED visits since last visit with PharmD? Yes  Patient denies hypoglycemic symptoms, including Pale, Sweaty, Shaky, Hungry, Nervous/irritable, and Vision changes  Patient denies hyperglycemic symptoms, including blurry vision, excessive thirst, fatigue, polyuria, and weakness  How often are you checking your blood sugar? Patient reports she checks her blood sugar weekly.  What are your blood sugars ranging?  On 02/27/2023 it was 130. On 02/23/2023 it was 118. On 02/14/2023 it was 133. On 02/09/2023 it was 113. On 02/02/2023 it was 100.  During the week, how often does your blood glucose drop below 70? Never  Are you checking your feet daily/regularly? Yes, Patient reports she checks her feet daily.  Patient states she had to Colonoscopy due to the first one was not clear enough.Patient states on the second colonoscopy she had 8 polyps that were removed. Patient states the polyps was benign,but precancerous.   Patient denies any questions for the clinical pharmacist at this time.  Office follow up appointment with [name], [facility] scheduled for : 05/24/2023 at 11:00 am.   Adherence Review: Is the patient currently on a STATIN medication? Yes Is the patient currently on ACE/ARB medication? Yes Does the patient have >5 day gap between last estimated fill  dates? No  Star[facility] Drugs: Atorvastatin 80 mg last filled on 02/17/2023 for 90 day supply [facility] Lisinopril 40 mg last filled on 12/06/2022 for 90 day supply [place] Metformin 750 mg last filled on 02/17/2023 for 90 day supply [place] Pioglitazone 15 mg last filled on 02/16/2023 for 90 day supply [place]: Annual wellness visit in last year? Yes last completed 07/20/2022 Last eye exam / retinopathy screening: Last completed 10/18/2022 Last diabetic foot exam:Last completed 09/17/2022 COVID-19 Vaccine  Chart Updates:  Recent office visits:  01/03/2023 Dr. Ancil Boozer MD (PCP) Decrease lisinopril from 40 to 20 mg,Labwork Completed, A1C- 7.1   Recent consult visits:  02/18/2023 Milagros Evener MD (Ortho Surgery) No Medication changes noted 02/04/2023 Dr. Rogue Bussing MD (Oncology) No Medication change noted 02/02/2023 Leroy Libman PA (Cardiology) No Medication changes noted, return in 9 months 12/17/2022 Milagros Evener MD (Ortho Surgery) Unable to see note  Hospital visits:  Medication Reconciliation was completed by comparing discharge summary, patient's EMR and Pharmacy list, and upon discussion with patient.  Admitted to the hospital on 02/25/2023 due to Colonoscopy. Discharge date was 02/25/2023. Discharged from [place]?Medications Started at [facility] Discharge:?? -started None ID  Medication Changes at Hospital Discharge: -Changed None ID  Medications Discontinued at Hospital Discharge: -Stopped None ID  Medications that remain the same after Hospital Discharge:??  -All other medications will remain the same.    Admitted to the hospital on 02/24/2023 due to Colonoscopy. Discharge date was 02/24/2023. Discharged from [place]?Medications Started at [facility] Discharge:?? -started None ID  Medication Changes at [facility]  Discharge: -Changed None ID  Medications Discontinued at Hospital  Discharge: -Stopped None ID  Medications that remain the same after Hospital Discharge:??  -All other medications will remain the same.    Medications: Outpatient Encounter Medications as of 03/01/2023  Medication Sig   acetaminophen (TYLENOL) 500 MG tablet Take 500 mg by mouth every 6 (six) hours as needed.   albuterol (VENTOLIN HFA) 108 (90 Base) MCG/ACT inhaler INHALE 2 PUFFS EVERY 6 HOURS AS NEEDED FOR WHEEZING OR SHORTNESS OF BREATH   Ascorbic Acid (VITAMIN C) 1000 MG tablet Take 1,000 mg by mouth daily.   aspirin EC 81 MG tablet Take 1 tablet (81 mg total) by mouth daily.   atorvastatin (LIPITOR) 80 MG tablet TAKE 1 TABLET EVERY DAY   BD PEN NEEDLE MICRO U/F 32G X 6 MM MISC USE ONCE DAILY   blood glucose meter kit and supplies KIT Dispense based on patient and insurance preference. Use to monitor blood sugars once daily as directed.   carvedilol (COREG) 6.25 MG tablet Take 6.25 mg by mouth daily.   Cholecalciferol (VITAMIN D-3) 1000 units CAPS Take 1,000 Units by mouth daily.    Dulaglutide (TRULICITY) 3 0000000 SOPN Inject 3 mg as directed once a week. Patient receives through [employer] Patient Assistance through Dec 2023   famotidine (PEPCID) 20 MG tablet TAKE 1 TABLET TWICE DAILY   fluticasone (FLONASE) 50 MCG/ACT nasal spray USE 2 SPRAYS IN EACH NOSTRIL EVERY DAY   furosemide (LASIX) 20 MG tablet TAKE 1 TABLET EVERY DAY   Ginger, Zingiber officinalis, (GINGER PO) Take by mouth. Patient reports taking OTC Ginger for nausea relief   Glycopyrrolate-Formoterol (BEVESPI AEROSPHERE) 9-4.8 MCG/ACT AERO INHALE 2 PUFFS BY MOUTH AT BEDTIME. Patient receives through [employer] Patient Assistance   Lancets (ONETOUCH DELICA PLUS [id number]) MISC CHECK GLUCOSE THREE TIMES DAILY   lisinopril (ZESTRIL) 20 MG tablet Take 1 tablet (20 mg total) by mouth at bedtime.   metFORMIN (GLUCOPHAGE-XR) 750 MG 24 hr tablet TAKE 2 TABLETS (1,500 MG TOTAL) DAILY WITH BREAKFAST.   Multiple Vitamin (MULTIVITAMIN)  tablet Take 1 tablet by mouth daily.   Omega-3 1000 MG CAPS Take by mouth.   pioglitazone (ACTOS) 15 MG tablet TAKE 1 TABLET EVERY DAY   polyethylene glycol powder (GLYCOLAX/MIRALAX) 17 GM/SCOOP powder Take 1 Container by mouth as needed. Taking 3 times a week   pregabalin (LYRICA) 75 MG capsule Take 1 capsule (75 mg total) by mouth 2 (two) times daily.   scopolamine (TRANSDERM-SCOP) 1 MG/3DAYS 1 patch every 3 (three) days. (Patient not taking: Reported on 02/24/2023)   tiZANidine (ZANAFLEX) 2 MG tablet 1 po tid prn   traMADol (ULTRAM) 50 MG tablet Take 25-50 mg by mouth 2 (two) times daily as needed.   vitamin B-12 (CYANOCOBALAMIN) 500 MCG tablet Take 1,000 mcg by mouth daily.   No facility-administered encounter medications on file as of 03/01/2023.    Recent Relevant Labs: Lab Results  Component Value Date/Time   HGBA1C 7.1 (A) 01/03/2023 01:15 PM   HGBA1C 6.6 (A) 09/17/2022 11:11 AM   HGBA1C 9.3 (A) 07/03/2019 09:19 AM   HGBA1C 6.9 03/01/2019 10:05 AM   MICROALBUR 0.4 01/03/2023 12:13 PM   MICROALBUR 1.6 01/05/2022 01:59 PM   MICROALBUR 50 07/22/2017 09:00 AM   MICROALBUR 50 07/15/2016 10:52 AM    Kidney Function Lab Results  Component Value Date/Time   CREATININE 0.81 01/03/2023 12:13 PM   CREATININE 0.77 07/13/2022 10:45 AM   CREATININE 0.82 07/05/2022 11:39 AM  CREATININE 0.80 01/05/2022 01:59 PM   GFRNONAA >60 07/13/2022 10:45 AM   GFRNONAA 67 12/10/2020 12:00 AM   GFRAA 77 12/10/2020 12:00 AM    [place] Pharmacist Assistant [phone]

## 2023-03-07 DIAGNOSIS — Z79899 Other long term (current) drug therapy: Secondary | ICD-10-CM | POA: Diagnosis not present

## 2023-03-07 DIAGNOSIS — M48062 Spinal stenosis, lumbar region with neurogenic claudication: Secondary | ICD-10-CM | POA: Diagnosis not present

## 2023-03-07 DIAGNOSIS — M5416 Radiculopathy, lumbar region: Secondary | ICD-10-CM | POA: Diagnosis not present

## 2023-03-07 DIAGNOSIS — M5412 Radiculopathy, cervical region: Secondary | ICD-10-CM | POA: Diagnosis not present

## 2023-03-07 DIAGNOSIS — M4802 Spinal stenosis, cervical region: Secondary | ICD-10-CM | POA: Diagnosis not present

## 2023-03-07 DIAGNOSIS — M5136 Other intervertebral disc degeneration, lumbar region: Secondary | ICD-10-CM | POA: Diagnosis not present

## 2023-03-07 DIAGNOSIS — M5030 Other cervical disc degeneration, unspecified cervical region: Secondary | ICD-10-CM | POA: Diagnosis not present

## 2023-03-22 ENCOUNTER — Other Ambulatory Visit: Payer: Self-pay | Admitting: Family Medicine

## 2023-03-23 ENCOUNTER — Other Ambulatory Visit: Payer: Self-pay | Admitting: Family Medicine

## 2023-03-30 DIAGNOSIS — S40862A Insect bite (nonvenomous) of left upper arm, initial encounter: Secondary | ICD-10-CM | POA: Diagnosis not present

## 2023-03-30 DIAGNOSIS — S40861A Insect bite (nonvenomous) of right upper arm, initial encounter: Secondary | ICD-10-CM | POA: Diagnosis not present

## 2023-03-30 DIAGNOSIS — W57XXXA Bitten or stung by nonvenomous insect and other nonvenomous arthropods, initial encounter: Secondary | ICD-10-CM | POA: Diagnosis not present

## 2023-04-14 ENCOUNTER — Other Ambulatory Visit: Payer: Self-pay | Admitting: Family Medicine

## 2023-05-04 ENCOUNTER — Telehealth: Payer: Self-pay

## 2023-05-04 DIAGNOSIS — J3089 Other allergic rhinitis: Secondary | ICD-10-CM

## 2023-05-04 MED ORDER — FLUTICASONE PROPIONATE 50 MCG/ACT NA SUSP: 2.0000 | Freq: Every day | NASAL | 1 refills | Status: DC

## 2023-05-04 NOTE — Progress Notes (Signed)
Patient is requesting a refill for her Flonase nasal spray to go to [facility] on [address]. Notified Clinical pharmacist.  Everlean Cherry Clinical Pharmacist Assistant [phone]

## 2023-05-04 NOTE — Addendum Note (Signed)
Addended by: Julious Payer A on: 05/04/2023 03:40 PM   Modules accepted: Orders

## 2023-05-04 NOTE — Addendum Note (Signed)
Addended by: Julious Payer A on: 05/04/2023 03:39 PM   Modules accepted: Orders

## 2023-05-11 DIAGNOSIS — Z4789 Encounter for other orthopedic aftercare: Secondary | ICD-10-CM | POA: Diagnosis not present

## 2023-05-11 DIAGNOSIS — Z96653 Presence of artificial knee joint, bilateral: Secondary | ICD-10-CM | POA: Diagnosis not present

## 2023-05-11 DIAGNOSIS — Z471 Aftercare following joint replacement surgery: Secondary | ICD-10-CM | POA: Diagnosis not present
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
## 2023-05-12 ENCOUNTER — Ambulatory Visit (INDEPENDENT_AMBULATORY_CARE_PROVIDER_SITE_OTHER): Payer: Medicare PPO | Admitting: Family Medicine

## 2023-05-12 ENCOUNTER — Encounter: Payer: Self-pay | Admitting: Family Medicine

## 2023-05-12 VITALS — BP 132/70 | HR 92 | Resp 16 | Ht 64.0 in | Wt 226.0 lb

## 2023-05-12 DIAGNOSIS — E785 Hyperlipidemia, unspecified: Secondary | ICD-10-CM | POA: Diagnosis not present

## 2023-05-12 DIAGNOSIS — E1169 Type 2 diabetes mellitus with other specified complication: Secondary | ICD-10-CM | POA: Diagnosis not present

## 2023-05-12 DIAGNOSIS — E1142 Type 2 diabetes mellitus with diabetic polyneuropathy: Secondary | ICD-10-CM | POA: Diagnosis not present

## 2023-05-12 DIAGNOSIS — Z7985 Long-term (current) use of injectable non-insulin antidiabetic drugs: Secondary | ICD-10-CM

## 2023-05-12 DIAGNOSIS — G8929 Other chronic pain: Secondary | ICD-10-CM

## 2023-05-12 DIAGNOSIS — M25512 Pain in left shoulder: Secondary | ICD-10-CM

## 2023-05-12 DIAGNOSIS — J411 Mucopurulent chronic bronchitis: Secondary | ICD-10-CM | POA: Diagnosis not present

## 2023-05-12 DIAGNOSIS — Z7984 Long term (current) use of oral hypoglycemic drugs: Secondary | ICD-10-CM | POA: Diagnosis not present

## 2023-05-12 DIAGNOSIS — I10 Essential (primary) hypertension: Secondary | ICD-10-CM

## 2023-05-12 DIAGNOSIS — F325 Major depressive disorder, single episode, in full remission: Secondary | ICD-10-CM

## 2023-05-12 DIAGNOSIS — D692 Other nonthrombocytopenic purpura: Secondary | ICD-10-CM | POA: Diagnosis not present

## 2023-05-12 LAB — POCT GLYCOSYLATED HEMOGLOBIN (HGB A1C): Hemoglobin A1C: 6.8 % — AB (ref 4.0–5.6)

## 2023-05-12 MED ORDER — LISINOPRIL 20 MG PO TABS: 20.0000 mg | ORAL_TABLET | Freq: Every day | ORAL | 1 refills | Status: DC

## 2023-05-13 ENCOUNTER — Other Ambulatory Visit: Payer: Self-pay | Admitting: Pharmacist

## 2023-05-13 ENCOUNTER — Encounter: Payer: Self-pay | Admitting: Pharmacist

## 2023-05-13 ENCOUNTER — Telehealth: Payer: Self-pay | Admitting: Family Medicine

## 2023-05-13 ENCOUNTER — Other Ambulatory Visit: Payer: Self-pay | Admitting: Family Medicine

## 2023-05-13 MED ORDER — BEVESPI AEROSPHERE 9-4.8 MCG/ACT IN AERO: INHALATION_SPRAY | RESPIRATORY_TRACT | 11 refills | Status: DC

## 2023-05-13 NOTE — Telephone Encounter (Signed)
Pt says "Cindy Griffin" says they need additional information to complete her application for her meds.

## 2023-05-13 NOTE — Progress Notes (Signed)
   05/13/2023  Patient ID: Cindy Griffin, female   DOB: 11-06-1946, 77 y.o.   MRN: [id number]   Receive message from office advising patient calling regarding refill of medicine from [employer] patient assistance program. From review of chart, note patient previously working with Hipolito Bayley and appears patient enrolled in [employer] patient assistance program for her Bevespi inhaler.  Was unable to reach patient via telephone today and have left HIPAA compliant voicemail asking patient to return my call.  Outreach to [employer] patient assistance program and speak with representative Lowella Bandy who advises patient's latest refill of Bevespi inhalers was shipped to patient's home address today and patient should expect to receive this shipment in the next 10-14 business days. Lowella Bandy advises message sent to patient today as program needs renewal of Bevespi prescription from provider before will be able to ship future refills.   Plan:  1) Will send MyChart message to patient to provide her with update  2) Will send message to PCP requesting provider send renewal of Bevespi to [facility] (dispensing pharmacy for [employer] assistance program  Estelle Grumbles, PharmD, [facility] [phone]

## 2023-05-13 NOTE — Telephone Encounter (Signed)
Pt is calling in requesting to speak with Trinna Post regarding some refills. Please follow up with pt.

## 2023-05-20 ENCOUNTER — Telehealth: Payer: Self-pay | Admitting: Pharmacist

## 2023-05-20 NOTE — Progress Notes (Signed)
Contacted patient regarding upcoming appointment with [facility] pharmacist. Patient would benefit from or requests a future appointment with clinical pharmacist. Appointment rescheduled.   Contacted patient and rescheduled.   Catie Eppie Gibson, PharmD, BCACP, CPP Clinical Pharmacist [facility] [phone]

## 2023-05-29 ENCOUNTER — Other Ambulatory Visit: Payer: Self-pay | Admitting: Family Medicine

## 2023-06-06 DIAGNOSIS — Z79899 Other long term (current) drug therapy: Secondary | ICD-10-CM | POA: Diagnosis not present

## 2023-06-06 DIAGNOSIS — M4802 Spinal stenosis, cervical region: Secondary | ICD-10-CM | POA: Diagnosis not present

## 2023-06-06 DIAGNOSIS — M5412 Radiculopathy, cervical region: Secondary | ICD-10-CM | POA: Diagnosis not present

## 2023-06-06 DIAGNOSIS — M5136 Other intervertebral disc degeneration, lumbar region: Secondary | ICD-10-CM | POA: Diagnosis not present

## 2023-06-06 DIAGNOSIS — M5030 Other cervical disc degeneration, unspecified cervical region: Secondary | ICD-10-CM | POA: Diagnosis not present

## 2023-06-06 DIAGNOSIS — M5416 Radiculopathy, lumbar region: Secondary | ICD-10-CM | POA: Diagnosis not present

## 2023-06-06 DIAGNOSIS — M48062 Spinal stenosis, lumbar region with neurogenic claudication: Secondary | ICD-10-CM | POA: Diagnosis not present

## 2023-06-10 DIAGNOSIS — M4802 Spinal stenosis, cervical region: Secondary | ICD-10-CM | POA: Diagnosis not present

## 2023-06-10 DIAGNOSIS — M5412 Radiculopathy, cervical region: Secondary | ICD-10-CM | POA: Diagnosis not present

## 2023-06-20 ENCOUNTER — Other Ambulatory Visit: Payer: Self-pay | Admitting: Family Medicine

## 2023-06-22 ENCOUNTER — Other Ambulatory Visit: Payer: Medicare PPO | Admitting: Pharmacist

## 2023-06-22 NOTE — Progress Notes (Signed)
06/23/2023 Name: Cindy Griffin MRN: [id number] DOB: September 18, 1946  Chief Complaint  Patient presents with   Medication Management   Medication Assistance    Cindy Griffin is a 77 y.o. year old female who presented for a telephone visit.   They were referred to the pharmacist by their PCP for assistance in managing diabetes, hypertension, and medication access.    Subjective:  Care Team: Primary Care Provider: Alba Cory, MD ; Next Scheduled Visit: 09/13/2023 Cardiologist: Cindy Griffin; Next Scheduled Visit: 11/09/2023 Physiatry: Cindy Griffin; Next Scheduled Visit:07/18/2023 Oncologist: Cindy Coder, MD; Next Scheduled Visit: 08/05/2023  Medication Access/Adherence  Current Pharmacy:  [facility] - [place], [place] - [address] [place] [address] Phone: [phone] Fax: [phone]  [facility] [address]), [place] - [address] [place]) [place] [address] Phone: [phone] Fax: [phone]   Patient reports affordability concerns with their medications: No  Patient reports access/transportation concerns to their pharmacy: No  Patient reports adherence concerns with their medications:  No    Uses weekly pillbox  Diabetes:  Current medications:  - Trulicity 3 mg weekly - metformin ER 750 - 2 tablets (1500 mg) daily with breakfast - pioglitazone 15 mg daily  Medications tried in the past: glipizide, Invokana, Soliqua, Synjardy, Victoza  Current morning glucose readings ranging: 77-124  Patient denies hypoglycemic s/sx including dizziness, shakiness, sweating.   Current physical activity: walks at mall (to walk on even ground) ~30-60 minutes with sister 3-4 times/week  Statin therapy: atorvastatin 80 mg daily  Current medication access support: enrolled in patient assistance for Trulicity from Cindy Griffin through  11/29/2023   Hypertension:  Current medications:  - carvedilol 6.25 mg twice daily - lisinopril 20 mg daily - furosemide 20 mg daily  Patient has an automated, upper arm home BP cuff Recalls last checked home blood pressure on 7/22, reading: ~126/74  Patient denies hypotensive s/sx including dizziness, lightheadedness.   Current physical activity: walks at mall (to walk on even ground) ~30-60 minutes with sister 3-4 times/week   COPD:  Note patient previously seen by Dr. Meredeth Ide with [facility]  Current medications: - Bevespi inhaler - 2 puffs by mouth at bedtime - albuterol inhaler - 2 puffs every 6 hours as needed for wheezing or shortness of breath  Medications tried in the past:   Reports recently needing her rescue (albuterol) inhaler ~ twice daily  Current medication access support: enrolled in patient assistance for Bevespi from Cindy [facility] through 11/29/2023    Objective:  Lab Results  Component Value Date   HGBA1C 6.8 (A) 05/12/2023    Lab Results  Component Value Date   CREATININE 0.81 01/03/2023   BUN 22 01/03/2023   NA 143 01/03/2023   K 4.7 01/03/2023   CL 107 01/03/2023   CO2 25 01/03/2023    Lab Results  Component Value Date   CHOL 129 01/03/2023   HDL 68 01/03/2023   LDLCALC 45 01/03/2023   TRIG 81 01/03/2023   CHOLHDL 1.9 01/03/2023   BP Readings from Last 3 Encounters:  05/12/23 132/70  02/25/23 120/82  02/24/23 (!) 142/74   Pulse Readings from Last 3 Encounters:  05/12/23 92  02/25/23 80  02/24/23 70     Medications Reviewed Today     Reviewed by Cindy Griffin (Pharmacist) on 06/22/23 at 1525  Med List Status: <None>   Medication Order Taking? Sig Documenting  Provider Last Dose Status Informant  acetaminophen (TYLENOL) 500 MG tablet [id number] Yes Take 500 mg by mouth every 6 (six) hours as needed. [provider] Taking Active   albuterol (VENTOLIN HFA) 108 (90 Base) MCG/ACT inhaler [id number] Yes  INHALE 2 PUFFS EVERY 6 HOURS AS NEEDED FOR WHEEZING OR SHORTNESS OF Cindy Griffin, Cindy Hefty, MD Taking Active   Ascorbic Acid (VITAMIN C) 1000 MG tablet [id number] Yes Take 1,000 mg by mouth daily. [provider] Taking Active Self  aspirin EC 81 MG tablet [id number] Yes Take 1 tablet (81 mg total) by mouth daily. Cindy Cory, MD Taking Active Self  atorvastatin (LIPITOR) 80 MG tablet [id number] Yes TAKE 1 TABLET EVERY DAY Cindy Mail, Griffin Taking Active   BD PEN NEEDLE MICRO U/F 32G X 6 MM MISC [id number]  USE ONCE DAILY Cindy Cory, MD  Active   blood glucose meter kit and supplies KIT [id number]  Dispense based on patient and insurance preference. Use to monitor blood sugars once daily as directed. Cindy Cory, MD  Active   carvedilol (COREG) 6.25 MG tablet [id number] Yes Take 6.25 mg by mouth 2 (two) times daily with a meal. [provider] Taking Active Self  Cholecalciferol (VITAMIN D-3) 1000 units CAPS [id number] Yes Take 1,000 Units by mouth daily.  [provider] Taking Active Self  Dulaglutide (TRULICITY) 3 MG/0.5ML SOPN [id number] Yes Inject 3 mg as directed once a week. Patient receives through [employer] Patient Assistance through Dec 2023 Cindy Cory, MD Taking Active   famotidine (PEPCID) 20 MG tablet [id number] Yes TAKE 1 TABLET TWICE DAILY Cindy Mail, Griffin Taking Active   fluticasone (FLONASE) 50 MCG/ACT nasal spray [id number] Yes Place 2 sprays into both nostrils daily. Cindy Cory, MD Taking Active   furosemide (LASIX) 20 MG tablet [id number] Yes TAKE 1 TABLET EVERY DAY Cindy Cory, MD Taking Active   Glycopyrrolate-Formoterol (BEVESPI AEROSPHERE) 9-4.8 MCG/ACT AERO [id number]  INHALE 2 PUFFS BY MOUTH AT BEDTIME. Patient receives through [facility] Patient Assistance Cindy Cory, MD  Active   Lancets [place] [facility] [place] [facility] [place]) [place] [phone]  CHECK GLUCOSE THREE TIMES DAILY [provider]  Active   lisinopril  (ZESTRIL) 20 MG tablet [id number] Yes Take 1 tablet (20 mg total) by mouth at bedtime. Cindy Cory, MD Taking Active   metFORMIN (GLUCOPHAGE-XR) 750 MG 24 hr tablet [id number] Yes TAKE 2 TABLETS (1,500 MG TOTAL) DAILY WITH BREAKFAST. Cindy Mail, Griffin Taking Active   Multiple Vitamin (MULTIVITAMIN) tablet [id number] Yes Take 1 tablet by mouth daily. [provider] Taking Active   Omega-3 1000 MG CAPS [id number] Yes Take by mouth. [provider] Taking Active   pioglitazone (ACTOS) 15 MG tablet [id number] Yes TAKE 1 TABLET EVERY DAY Sowles, Cindy Hefty, MD Taking Active   polyethylene glycol powder (GLYCOLAX/MIRALAX) 17 GM/SCOOP powder [id number] Yes Take 1 Container by mouth as needed. Taking 3 times a week [provider] Taking Active   pregabalin (LYRICA) 75 MG capsule [id number] Yes Take 1 capsule (75 mg total) by mouth 2 (two) times daily. Cindy Cory, MD Taking Active   tiZANidine (ZANAFLEX) 2 MG tablet [id number] Yes 1 po tid prn [provider] Taking Active   traMADol (ULTRAM) 50 MG tablet [id number] Yes Take 25-50 mg by mouth 2 (two) times daily as needed. [provider] Taking Active   vitamin B-12 (CYANOCOBALAMIN) 500 MCG tablet [id number] Yes Take 1,000 mcg by mouth daily. [provider] Taking Active Self  Assessment/Plan:   Comprehensive medication review performed; medication list updated in electronic medical record - Caution patient for risk of dizziness and sedation with pregabalin, tizanidine, tramadol and trazodone, particularly if taken in combination  Patient verbalizes understanding Reports takes tizanidine and trazodone each only as needed  Encourage patient to consider using daily phone alarm as medication adherence aid  Diabetes: - Currently controlled - Reviewed long term cardiovascular and renal outcomes of uncontrolled blood sugar - Reviewed goal A1c, goal fasting, and goal 2 hour post  prandial glucose - Encourage patient to have regular well-balanced meals throughout the day, while controlling carbohydrate portion sizes  Encourage patient to limit sugary beverages (sweet tea), drinking water instead - Recommend to check glucose, keep log of results and have this record to review at upcoming medical appointments. Patient to contact provider office sooner if needed for readings outside of established parameters or symptoms   Hypertension: - Encourage patient to continue to take positional change slowly - Reviewed long term cardiovascular and renal outcomes of uncontrolled blood pressure - Recommend to continue to monitor home blood pressure, keep log of results and have this record to review at upcoming medical appointments. Patient to contact provider office sooner if needed for readings outside of established parameters or symptoms   COPD: - Reviewed appropriate inhaler technique. - Collaborated with PCP to recommend adjusting patient's Bevespi prescription to 2 puffs twice daily to provide treatment of patient's COPD to last throughout the day  Provider agreed and new Rx sent to [facility] (dispensing pharmacy for Cindy Griffin)    Follow Up Plan: Clinical Pharmacist will follow up with patient by telephone on 09/28/2023 at 8:30 am  Estelle Grumbles, PharmD, [facility] [phone]

## 2023-06-23 NOTE — Patient Instructions (Signed)
Goals Addressed             This Visit's Progress    Pharmacy Goals       Our goal A1c is less than 7%. This corresponds with fasting sugars less than 130 and 2 hour after meal sugars less than 180. Please keep a log of your results when checking your blood sugar   Our goal bad cholesterol, or LDL, is less than 70. This is why it is important to continue taking your atorvastatin.  Check your blood pressure twice weekly, and any time you have concerning symptoms like headache, chest pain, dizziness, shortness of breath, or vision changes.   Our goal is less than 130/80.  To appropriately check your blood pressure, make sure you do the following:  1) Avoid caffeine, exercise, or tobacco products for 30 minutes before checking. Empty your bladder. 2) Sit with your back supported in a flat-backed chair. Rest your arm on something flat (arm of the chair, table, etc). 3) Sit still with your feet flat on the floor, resting, for at least 5 minutes.  4) Check your blood pressure. Take 1-2 readings.  5) Write down these readings and bring with you to any provider appointments.  Bring your home blood pressure machine with you to a provider's office for accuracy comparison at least once a year.   Make sure you take your blood pressure medications before you come to any office visit, even if you were asked to fast for labs.  Estelle Grumbles, PharmD, [facility] [phone]

## 2023-06-24 ENCOUNTER — Other Ambulatory Visit: Payer: Self-pay | Admitting: Family Medicine

## 2023-06-24 ENCOUNTER — Other Ambulatory Visit: Payer: Self-pay

## 2023-06-24 DIAGNOSIS — J411 Mucopurulent chronic bronchitis: Secondary | ICD-10-CM

## 2023-06-24 MED ORDER — BEVESPI AEROSPHERE 9-4.8 MCG/ACT IN AERO: 2.0000 | INHALATION_SPRAY | Freq: Two times a day (BID) | RESPIRATORY_TRACT | 11 refills | Status: DC

## 2023-06-27 ENCOUNTER — Other Ambulatory Visit: Payer: Self-pay | Admitting: Family Medicine

## 2023-06-27 DIAGNOSIS — E1142 Type 2 diabetes mellitus with diabetic polyneuropathy: Secondary | ICD-10-CM

## 2023-07-13 ENCOUNTER — Other Ambulatory Visit: Payer: Self-pay | Admitting: Family Medicine

## 2023-07-14 ENCOUNTER — Ambulatory Visit (INDEPENDENT_AMBULATORY_CARE_PROVIDER_SITE_OTHER): Payer: Medicare PPO

## 2023-07-14 VITALS — Ht 64.0 in | Wt 226.0 lb

## 2023-07-14 DIAGNOSIS — Z0000 Encounter for general adult medical examination without abnormal findings: Secondary | ICD-10-CM | POA: Diagnosis not present
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
## 2023-07-14 NOTE — Patient Instructions (Addendum)
Cindy Griffin , Thank you for taking time to come for your Medicare Wellness Visit. I appreciate your ongoing commitment to your health goals. Please review the following plan we discussed and let me know if I can assist you in the future.   Referrals/Orders/Follow-Ups/Clinician Recommendations: none  This is a list of the screening recommended for you and due dates:  Health Maintenance  Topic Date Due   COVID-19 Vaccine (7 - 2023-24 season) 10/31/2022   Flu Shot  06/30/2023   Mammogram  09/16/2023   Complete foot exam   09/18/2023   Eye exam for diabetics  10/19/2023   Hemoglobin A1C  11/11/2023   Yearly kidney function blood test for diabetes  01/04/2024   Yearly kidney health urinalysis for diabetes  01/04/2024   Colon Cancer Screening  02/25/2024   Medicare Annual Wellness Visit  07/13/2024   DTaP/Tdap/Td vaccine (3 - Td or Tdap) 01/06/2032   Pneumonia Vaccine  Completed   DEXA scan (bone density measurement)  Completed   Hepatitis C Screening  Completed   Zoster (Shingles) Vaccine  Completed   HPV Vaccine  Aged Out    Advanced directives: (In Chart) A copy of your advanced directives are scanned into your chart should your provider ever need it.  Next Medicare Annual Wellness Visit scheduled for next year: Yes 07/20/2023 @9 :15am telephone  Preventive Care 65 Years and Older, Female Preventive care refers to lifestyle choices and visits with your health care provider that can promote health and wellness. What does preventive care include? A yearly physical exam. This is also called an annual well check. Dental exams once or twice a year. Routine eye exams. Ask your health care provider how often you should have your eyes checked. Personal lifestyle choices, including: Daily care of your teeth and gums. Regular physical activity. Eating a healthy diet. Avoiding tobacco and drug use. Limiting alcohol use. Practicing safe sex. Taking low-dose aspirin every day. Taking vitamin  and mineral supplements as recommended by your health care provider. What happens during an annual well check? The services and screenings done by your health care provider during your annual well check will depend on your age, overall health, lifestyle risk factors, and family history of disease. Counseling  Your health care provider may ask you questions about your: Alcohol use. Tobacco use. Drug use. Emotional well-being. Home and relationship well-being. Sexual activity. Eating habits. History of falls. Memory and ability to understand (cognition). Work and work Astronomer. Reproductive health. Screening  You may have the following tests or measurements: Height, weight, and BMI. Blood pressure. Lipid and cholesterol levels. These may be checked every 5 years, or more frequently if you are over 30 years old. Skin check. Lung cancer screening. You may have this screening every year starting at age 76 if you have a 30-pack-year history of smoking and currently smoke or have quit within the past 15 years. Fecal occult blood test (FOBT) of the stool. You may have this test every year starting at age 40. Flexible sigmoidoscopy or colonoscopy. You may have a sigmoidoscopy every 5 years or a colonoscopy every 10 years starting at age 50. Hepatitis C blood test. Hepatitis B blood test. Sexually transmitted disease (STD) testing. Diabetes screening. This is done by checking your blood sugar (glucose) after you have not eaten for a while (fasting). You may have this done every 1-3 years. Bone density scan. This is done to screen for osteoporosis. You may have this done starting at age 37. Mammogram. This  may be done every 1-2 years. Talk to your health care provider about how often you should have regular mammograms. Talk with your health care provider about your test results, treatment options, and if necessary, the need for more tests. Vaccines  Your health care provider may recommend  certain vaccines, such as: Influenza vaccine. This is recommended every year. Tetanus, diphtheria, and acellular pertussis (Tdap, Td) vaccine. You may need a Td booster every 10 years. Zoster vaccine. You may need this after age 22. Pneumococcal 13-valent conjugate (PCV13) vaccine. One dose is recommended after age 70. Pneumococcal polysaccharide (PPSV23) vaccine. One dose is recommended after age 48. Talk to your health care provider about which screenings and vaccines you need and how often you need them. This information is not intended to replace advice given to you by your health care provider. Make sure you discuss any questions you have with your health care provider. Document Released: 12/12/2015 Document Revised: 08/04/2016 Document Reviewed: 09/16/2015 Elsevier Interactive Patient Education  2017 [employer].  Fall Prevention in the Home Falls can cause injuries. They can happen to people of all ages. There are many things you can do to make your home safe and to help prevent falls. What can I do on the outside of my home? Regularly fix the edges of walkways and driveways and fix any cracks. Remove anything that might make you trip as you walk through a door, such as a raised step or threshold. Trim any bushes or trees on the path to your home. Use bright outdoor lighting. Clear any walking paths of anything that might make someone trip, such as rocks or tools. Regularly check to see if handrails are loose or broken. Make sure that both sides of any steps have handrails. Any raised decks and porches should have guardrails on the edges. Have any leaves, snow, or ice cleared regularly. Use sand or salt on walking paths during winter. Clean up any spills in your garage right away. This includes oil or grease spills. What can I do in the bathroom? Use night lights. Install grab bars by the toilet and in the tub and shower. Do not use towel bars as grab bars. Use non-skid mats or  decals in the tub or shower. If you need to sit down in the shower, use a plastic, non-slip stool. Keep the floor dry. Clean up any water that spills on the floor as soon as it happens. Remove soap buildup in the tub or shower regularly. Attach bath mats securely with double-sided non-slip rug tape. Do not have throw rugs and other things on the floor that can make you trip. What can I do in the bedroom? Use night lights. Make sure that you have a light by your bed that is easy to reach. Do not use any sheets or blankets that are too big for your bed. They should not hang down onto the floor. Have a firm chair that has side arms. You can use this for support while you get dressed. Do not have throw rugs and other things on the floor that can make you trip. What can I do in the kitchen? Clean up any spills right away. Avoid walking on wet floors. Keep items that you use a lot in easy-to-reach places. If you need to reach something above you, use a strong step stool that has a grab bar. Keep electrical cords out of the way. Do not use floor polish or wax that makes floors slippery. If you must  use wax, use non-skid floor wax. Do not have throw rugs and other things on the floor that can make you trip. What can I do with my stairs? Do not leave any items on the stairs. Make sure that there are handrails on both sides of the stairs and use them. Fix handrails that are broken or loose. Make sure that handrails are as long as the stairways. Check any carpeting to make sure that it is firmly attached to the stairs. Fix any carpet that is loose or worn. Avoid having throw rugs at the top or bottom of the stairs. If you do have throw rugs, attach them to the floor with carpet tape. Make sure that you have a light switch at the top of the stairs and the bottom of the stairs. If you do not have them, ask someone to add them for you. What else can I do to help prevent falls? Wear shoes that: Do not  have high heels. Have rubber bottoms. Are comfortable and fit you well. Are closed at the toe. Do not wear sandals. If you use a stepladder: Make sure that it is fully opened. Do not climb a closed stepladder. Make sure that both sides of the stepladder are locked into place. Ask someone to hold it for you, if possible. Clearly mark and make sure that you can see: Any grab bars or handrails. First and last steps. Where the edge of each step is. Use tools that help you move around (mobility aids) if they are needed. These include: Canes. Walkers. Scooters. Crutches. Turn on the lights when you go into a dark area. Replace any light bulbs as soon as they burn out. Set up your furniture so you have a clear path. Avoid moving your furniture around. If any of your floors are uneven, fix them. If there are any pets around you, be aware of where they are. Review your medicines with your doctor. Some medicines can make you feel dizzy. This can increase your chance of falling. Ask your doctor what other things that you can do to help prevent falls. This information is not intended to replace advice given to you by your health care provider. Make sure you discuss any questions you have with your health care provider. Document Released: 09/11/2009 Document Revised: 04/22/2016 Document Reviewed: 12/20/2014 Elsevier Interactive Patient Education  2017 [employer].

## 2023-07-18 DIAGNOSIS — M4802 Spinal stenosis, cervical region: Secondary | ICD-10-CM | POA: Diagnosis not present

## 2023-07-18 DIAGNOSIS — M5416 Radiculopathy, lumbar region: Secondary | ICD-10-CM | POA: Diagnosis not present

## 2023-07-18 DIAGNOSIS — M5412 Radiculopathy, cervical region: Secondary | ICD-10-CM | POA: Diagnosis not present

## 2023-07-18 DIAGNOSIS — Z79899 Other long term (current) drug therapy: Secondary | ICD-10-CM | POA: Diagnosis not present

## 2023-07-18 DIAGNOSIS — M48062 Spinal stenosis, lumbar region with neurogenic claudication: Secondary | ICD-10-CM | POA: Diagnosis not present

## 2023-07-19 ENCOUNTER — Ambulatory Visit: Payer: Medicare PPO | Admitting: Family Medicine

## 2023-07-19 ENCOUNTER — Encounter: Payer: Self-pay | Admitting: Family Medicine

## 2023-07-19 VITALS — BP 136/74 | HR 78 | Temp 98.0°F | Resp 16 | Ht 64.0 in | Wt 228.7 lb

## 2023-07-19 DIAGNOSIS — R21 Rash and other nonspecific skin eruption: Secondary | ICD-10-CM

## 2023-07-19 MED ORDER — LORATADINE 10 MG PO TABS: 10.0000 mg | ORAL_TABLET | Freq: Every day | ORAL | 1 refills | Status: DC

## 2023-07-19 MED ORDER — HYDROXYZINE PAMOATE 25 MG PO CAPS: 25.0000 mg | ORAL_CAPSULE | Freq: Three times a day (TID) | ORAL | 0 refills | Status: DC | PRN

## 2023-07-19 MED ORDER — PREDNISONE 10 MG (21) PO TBPK: ORAL_TABLET | ORAL | 0 refills | Status: DC

## 2023-07-19 MED ORDER — HYDROXYZINE PAMOATE 25 MG PO CAPS
25.0000 mg | ORAL_CAPSULE | Freq: Three times a day (TID) | ORAL | 0 refills | Status: DC | PRN
Start: 2023-07-19 — End: 2024-01-16

## 2023-07-19 NOTE — Patient Instructions (Addendum)
Start taking the loratidine once daily for at least a few weeks If you are severely itchy then take the hydroxyzine as needed for severe itching  Try continuing vaseline and I would try cortisone 10 or hydrocortisone 1% cream over the counter to intact skin.  If there is any worsening you should start the steroid taper pack and follow up in office.  Contact Dermatitis Dermatitis is when your skin becomes red, sore, and swollen.  Contact dermatitis happens when your body reacts to something that touches the skin. There are 2 types: Irritant contact dermatitis. This is when something bothers your skin, like soap. Allergic contact dermatitis. This is when your skin touches something you are allergic to, like poison ivy. What are the causes? Irritant contact dermatitis may be caused by: Makeup. Soaps. Detergents. Bleaches. Acids. Metals, like nickel. Allergic contact dermatitis may be caused by: Plants. Chemicals. Jewelry. Latex. Medicines. Preservatives. These are things added to products to help them last longer. There may be some in your clothes. What increases the risk? Having a job where you have to be near things that bother your skin. Having asthma or eczema. What are the signs or symptoms?  Dry or flaky skin. Redness. Cracks. Itching. Moderate symptoms of this condition include: Pain or a burning feeling. Blisters. Blood or clear fluid coming from cracks in your skin. Swelling. This may be on your eyelids, mouth, or genitals. How is this treated? Your doctor will find out what is making your skin react. Then, you can protect your skin. You may need to use: Steroid creams, ointments, or medicines. Antibiotics or other ointments, if you have a skin infection. Lotion or medicines to help with itching. A bandage. Follow these instructions at home: Skin care Put moisturizer on your skin when it needs it. Put cool, wet cloths on your skin (cool compresses). Put a  baking soda paste on your skin. Stir water into baking soda until it looks like a paste. Do not scratch your skin. Try not to have things rub up against your skin. Avoid tight clothing. Avoid using soaps, perfumes, and dyes. Check your skin every day for signs of infection. Check for: More redness, swelling, or pain. More fluid or blood. Warmth. Pus or a bad smell. Medicines Take or apply over-the-counter and prescription medicines only as told by your doctor. If you were prescribed antibiotics, take or apply them as told by your doctor. Do not stop using them even if you start to feel better. Bathing Take a bath with: Epsom salts. Baking soda. Colloidal oatmeal. Bathe less often. Bathe in warm water. Try not to use hot water. Bandage care If you were given a bandage, change it as told by your doctor. Wash your hands with soap and water for at least 20 seconds before and after you change your bandage. If you cannot use soap and water, use hand sanitizer. General instructions Avoid the things that caused your reaction. If you don't know what caused it, keep a journal. Write down: What you eat. What skin products you use. What you drink. What you wear. Contact a doctor if: You do not get better with treatment. You get worse. You have signs of infection. You have a fever. You have new symptoms. Your bone or joint near the area hurts after the skin has healed. Get help right away if: You see red streaks coming from the area. The area turns darker. You have trouble breathing. This information is not intended to replace advice given  to you by your health care provider. Make sure you discuss any questions you have with your health care provider. Document Revised: 05/21/2022 Document Reviewed: 05/21/2022 Elsevier Patient Education  2024 [employer].

## 2023-07-19 NOTE — Telephone Encounter (Signed)
Copied from CRM [id number]. Topic: Appointment Scheduling - Scheduling Inquiry for Clinic >> Jul 19, 2023  1:11 PM Franchot Heidelberg wrote: Reason for CRM: Pt needs to reschedule her appt 09/28/2023 with Veverly Fells. Please advise  Prefers Monday/Thursday mornings.

## 2023-07-19 NOTE — Progress Notes (Signed)
Patient ID: Cindy Griffin, female    DOB: [birth year], 77 y.o.   MRN: [id number]  PCP: Alba Cory, MD  Chief Complaint  Patient presents with   Arms    Have been itchy for over a month along with ankles, has been doing ointments at home no help    Subjective:   Cindy Griffin is a 77 y.o. female, presents to clinic with CC of the following:  HPI  Rash for over a month to the back of both arms, stratches, dryness, itching, bumps no where else on body.  No facial rash or swelling, no difficulty breathing No new soaps detergents, meds, foods      Patient Active Problem List   Diagnosis Date Noted   Adenomatous polyp of colon 02/24/2023   De Quervain's tenosynovitis 10/15/2022   Chronic left shoulder pain 09/17/2022   Hypertension associated with type 2 diabetes mellitus (HCC) 09/17/2022   Major depression in remission (HCC) 09/17/2022   Primary osteoarthritis of first carpometacarpal joint of left hand 08/27/2022   Senile purpura (HCC) 05/17/2022   Bilateral leg edema 05/01/2020   SOBOE (shortness of breath on exertion) 05/01/2020   Colon cancer, ascending (HCC) 05/25/2019   Mild episode of recurrent major depressive disorder (HCC) 08/01/2018   Personal history of colon cancer 05/19/2018   DDD (degenerative disc disease), lumbar 07/28/2015   Spinal stenosis at L4-L5 level 06/12/2015   Benign essential HTN 05/25/2015   Bunion 05/25/2015   Cardiac enlargement 05/25/2015   Cervical radicular pain 05/25/2015   Back pain, chronic 05/25/2015   Osteoarthritis 05/25/2015   Dyslipidemia 05/25/2015   Gastric reflux 05/25/2015   Insomnia 05/25/2015   Eczema intertrigo 05/25/2015   Bronchitis, chronic, mucopurulent (HCC) 05/25/2015   Numerous moles 05/25/2015   Morbid obesity (HCC) 05/25/2015   Perennial allergic rhinitis 05/25/2015   History of artificial joint 05/25/2015   Dyslipidemia associated with type 2 diabetes mellitus (HCC) 05/25/2015   Degeneration of  intervertebral disc of cervical region 02/06/2015   Neuritis or radiculitis due to rupture of lumbar intervertebral disc 01/14/2015   Vitamin D deficiency 05/04/2010      Current Outpatient Medications:    acetaminophen (TYLENOL) 500 MG tablet, Take 500 mg by mouth every 6 (six) hours as needed., Disp: , Rfl:    albuterol (VENTOLIN HFA) 108 (90 Base) MCG/ACT inhaler, INHALE 2 PUFFS EVERY 6 HOURS AS NEEDED FOR WHEEZING OR SHORTNESS OF BREATH, Disp: 2 each, Rfl: 0   Ascorbic Acid (VITAMIN C) 1000 MG tablet, Take 1,000 mg by mouth daily., Disp: , Rfl:    aspirin EC 81 MG tablet, Take 1 tablet (81 mg total) by mouth daily., Disp: 90 tablet, Rfl: 1   atorvastatin (LIPITOR) 80 MG tablet, TAKE 1 TABLET EVERY DAY, Disp: 90 tablet, Rfl: 3   BD PEN NEEDLE MICRO U/F 32G X 6 MM MISC, USE ONCE DAILY, Disp: 100 each, Rfl: 2   Blood Glucose Monitoring Suppl (ACCU-CHEK GUIDE ME) w/Device KIT, USE TO CHECK BLOOD SUGAR ONCE DAILY AS DIRECTED, Disp: 1 kit, Rfl: 0   carvedilol (COREG) 6.25 MG tablet, Take 6.25 mg by mouth 2 (two) times daily with a meal., Disp: , Rfl:    Cholecalciferol (VITAMIN D-3) 1000 units CAPS, Take 1,000 Units by mouth daily. , Disp: , Rfl:    Dulaglutide (TRULICITY) 3 MG/0.5ML SOPN, Inject 3 mg as directed once a week. Patient receives through [employer] Patient Assistance through Dec 2023, Disp: , Rfl:    famotidine (  PEPCID) 20 MG tablet, TAKE 1 TABLET TWICE DAILY, Disp: 180 tablet, Rfl: 3   fluticasone (FLONASE) 50 MCG/ACT nasal spray, Place 2 sprays into both nostrils daily., Disp: 48 g, Rfl: 1   furosemide (LASIX) 20 MG tablet, TAKE 1 TABLET EVERY DAY, Disp: 90 tablet, Rfl: 0   Glycopyrrolate-Formoterol (BEVESPI AEROSPHERE) 9-4.8 MCG/ACT AERO, Take 2 puffs by mouth 2 (two) times daily. Patient receives through [employer] Patient Assistance, Disp: 32 each, Rfl: 11   Lancets (ONETOUCH DELICA PLUS LANCET33G) MISC, CHECK GLUCOSE THREE TIMES DAILY, Disp: , Rfl:    lisinopril (ZESTRIL) 20 MG  tablet, Take 1 tablet (20 mg total) by mouth at bedtime., Disp: 90 tablet, Rfl: 1   metFORMIN (GLUCOPHAGE-XR) 750 MG 24 hr tablet, TAKE 2 TABLETS (1,500 MG TOTAL) DAILY WITH BREAKFAST., Disp: 180 tablet, Rfl: 3   Multiple Vitamin (MULTIVITAMIN) tablet, Take 1 tablet by mouth daily., Disp: , Rfl:    Omega-3 1000 MG CAPS, Take by mouth., Disp: , Rfl:    pioglitazone (ACTOS) 15 MG tablet, TAKE 1 TABLET EVERY DAY, Disp: 90 tablet, Rfl: 3   polyethylene glycol powder (GLYCOLAX/MIRALAX) 17 GM/SCOOP powder, Take 1 Container by mouth as needed. Taking 3 times a week, Disp: , Rfl:    pregabalin (LYRICA) 75 MG capsule, Take 1 capsule (75 mg total) by mouth 2 (two) times daily., Disp: 180 capsule, Rfl: 1   tiZANidine (ZANAFLEX) 2 MG tablet, 1 po tid prn, Disp: , Rfl:    traMADol (ULTRAM) 50 MG tablet, Take 25-50 mg by mouth 2 (two) times daily as needed., Disp: , Rfl:    traZODone (DESYREL) 50 MG tablet, Take 50 mg by mouth at bedtime as needed., Disp: , Rfl:    vitamin B-12 (CYANOCOBALAMIN) 500 MCG tablet, Take 1,000 mcg by mouth daily., Disp: , Rfl:    Allergies  Allergen Reactions   Doxycycline Rash    Itching     Social History   Tobacco Use   Smoking status: Never   Smokeless tobacco: Never   Tobacco comments:    smoking cessation materials not required  Vaping Use   Vaping status: Never Used  Substance Use Topics   Alcohol use: Not Currently    Alcohol/week: 0.0 standard drinks of alcohol   Drug use: No      Chart Review Today: I personally reviewed active problem list, medication list, allergies, family history, social history, health maintenance, notes from last encounter, lab results, imaging with the patient/caregiver today.   Review of Systems  Constitutional: Negative.   HENT: Negative.    Eyes: Negative.   Respiratory: Negative.    Cardiovascular: Negative.   Gastrointestinal: Negative.   Endocrine: Negative.   Genitourinary: Negative.   Musculoskeletal: Negative.    Skin: Negative.   Allergic/Immunologic: Negative.   Neurological: Negative.   Hematological: Negative.   Psychiatric/Behavioral: Negative.    All other systems reviewed and are negative.      Objective:   Vitals:   07/19/23 1019  BP: 136/74  Pulse: 78  Resp: 16  Temp: 98 F (36.7 C)  TempSrc: Oral  SpO2: 98%  Weight: 228 lb 11.2 oz (103.7 kg)  Height: 5\' 4"  (1.626 m)    Body mass index is 39.26 kg/m.  Physical Exam Vitals and nursing note reviewed.  Constitutional:      General: She is not in acute distress.    Appearance: Normal appearance. She is well-developed. She is not ill-appearing, toxic-appearing or diaphoretic.  HENT:     Head:  Normocephalic and atraumatic.     Nose: Nose normal.  Eyes:     General:        Right eye: No discharge.        Left eye: No discharge.     Conjunctiva/sclera: Conjunctivae normal.  Neck:     Trachea: No tracheal deviation.  Cardiovascular:     Rate and Rhythm: Normal rate and regular rhythm.  Pulmonary:     Effort: Pulmonary effort is normal. No respiratory distress.     Breath sounds: No stridor.  Musculoskeletal:        General: Normal range of motion.  Skin:    General: Skin is warm and dry.     Findings: Rash present.  Neurological:     Mental Status: She is alert.     Motor: No abnormal muscle tone.     Coordination: Coordination normal.  Psychiatric:        Behavior: Behavior normal.      Results for orders placed or performed in visit on 05/12/23  POCT HgB A1C  Result Value Ref Range   Hemoglobin A1C 6.8 (A) 4.0 - 5.6 %   HbA1c POC (<> result, manual entry)     HbA1c, POC (prediabetic range)     HbA1c, POC (controlled diabetic range)         Assessment & Plan:     ICD-10-CM   1. Rash and nonspecific skin eruption  R21 loratadine (CLARITIN) 10 MG tablet    hydrOXYzine (VISTARIL) 25 MG capsule    predniSONE (STERAPRED UNI-PAK 21 TAB) 10 MG (21) TBPK tablet    DISCONTINUED: loratadine (CLARITIN) 10 MG  tablet    DISCONTINUED: hydrOXYzine (VISTARIL) 25 MG capsule     Will do trial of antihistamines, add pepcid if needed, hydroxyzine as needed for severe itching, if there is any worsening or spread would recommend starting prednisone, but pt would like to avoid it is possible.  F/up if no improvement or if rebound or if pt would like referral to dermatology I am willing to order that (though expect lengthy delay for new pt appt)  instructions given to pt on AVS: Start taking the loratidine once daily for at least a few weeks If you are severely itchy then take the hydroxyzine as needed for severe itching  Try continuing vaseline and I would try cortisone 10 or hydrocortisone 1% cream over the counter to intact skin.  If there is any worsening you should start the steroid taper pack and follow up in office.   Danelle Berry, PA-C 07/19/23 10:38 AM

## 2023-07-29 DIAGNOSIS — M65321 Trigger finger, right index finger: Secondary | ICD-10-CM | POA: Diagnosis not present

## 2023-07-30 ENCOUNTER — Encounter: Payer: Self-pay | Admitting: Family Medicine

## 2023-08-03 ENCOUNTER — Other Ambulatory Visit: Payer: Medicare PPO

## 2023-08-04 ENCOUNTER — Inpatient Hospital Stay: Payer: Medicare PPO | Attending: Internal Medicine

## 2023-08-04 DIAGNOSIS — N189 Chronic kidney disease, unspecified: Secondary | ICD-10-CM | POA: Diagnosis not present

## 2023-08-04 DIAGNOSIS — Z90710 Acquired absence of both cervix and uterus: Secondary | ICD-10-CM | POA: Insufficient documentation

## 2023-08-04 DIAGNOSIS — Z8542 Personal history of malignant neoplasm of other parts of uterus: Secondary | ICD-10-CM | POA: Diagnosis not present

## 2023-08-04 DIAGNOSIS — E1122 Type 2 diabetes mellitus with diabetic chronic kidney disease: Secondary | ICD-10-CM | POA: Insufficient documentation

## 2023-08-04 DIAGNOSIS — R970 Elevated carcinoembryonic antigen [CEA]: Secondary | ICD-10-CM | POA: Diagnosis not present

## 2023-08-04 DIAGNOSIS — Z800 Family history of malignant neoplasm of digestive organs: Secondary | ICD-10-CM | POA: Diagnosis not present

## 2023-08-04 DIAGNOSIS — Z85038 Personal history of other malignant neoplasm of large intestine: Secondary | ICD-10-CM | POA: Diagnosis not present

## 2023-08-04 DIAGNOSIS — C182 Malignant neoplasm of ascending colon: Secondary | ICD-10-CM

## 2023-08-04 LAB — CBC WITH DIFFERENTIAL/PLATELET
Abs Immature Granulocytes: 0.03 10*3/uL (ref 0.00–0.07)
Basophils Absolute: 0.1 10*3/uL (ref 0.0–0.1)
Basophils Relative: 1 %
Eosinophils Absolute: 0.1 10*3/uL (ref 0.0–0.5)
Eosinophils Relative: 2 %
HCT: 39 % (ref 36.0–46.0)
Hemoglobin: 12.6 g/dL (ref 12.0–15.0)
Immature Granulocytes: 0 %
Lymphocytes Relative: 21 %
Lymphs Abs: 1.6 10*3/uL (ref 0.7–4.0)
MCH: 29 pg (ref 26.0–34.0)
MCHC: 32.3 g/dL (ref 30.0–36.0)
MCV: 89.7 fL (ref 80.0–100.0)
Monocytes Absolute: 0.6 10*3/uL (ref 0.1–1.0)
Monocytes Relative: 8 %
Neutro Abs: 5.2 10*3/uL (ref 1.7–7.7)
Neutrophils Relative %: 68 %
Platelets: 254 10*3/uL (ref 150–400)
RBC: 4.35 MIL/uL (ref 3.87–5.11)
RDW: 15.3 % (ref 11.5–15.5)
WBC: 7.5 10*3/uL (ref 4.0–10.5)
nRBC: 0 % (ref 0.0–0.2)

## 2023-08-04 LAB — COMPREHENSIVE METABOLIC PANEL
ALT: 22 U/L (ref 0–44)
AST: 20 U/L (ref 15–41)
Albumin: 4.4 g/dL (ref 3.5–5.0)
Alkaline Phosphatase: 85 U/L (ref 38–126)
Anion gap: 9 (ref 5–15)
BUN: 24 mg/dL — ABNORMAL HIGH (ref 8–23)
CO2: 24 mmol/L (ref 22–32)
Calcium: 9.4 mg/dL (ref 8.9–10.3)
Chloride: 106 mmol/L (ref 98–111)
Creatinine, Ser: 1.01 mg/dL — ABNORMAL HIGH (ref 0.44–1.00)
GFR, Estimated: 57 mL/min — ABNORMAL LOW (ref >60)
Glucose, Bld: 162 mg/dL — ABNORMAL HIGH (ref 70–99)
Potassium: 4.8 mmol/L (ref 3.5–5.1)
Sodium: 139 mmol/L (ref 135–145)
Total Bilirubin: 0.8 mg/dL (ref 0.3–1.2)
Total Protein: 7.6 g/dL (ref 6.5–8.1)

## 2023-08-05 ENCOUNTER — Inpatient Hospital Stay (HOSPITAL_BASED_OUTPATIENT_CLINIC_OR_DEPARTMENT_OTHER): Payer: Medicare PPO | Admitting: Internal Medicine

## 2023-08-05 ENCOUNTER — Encounter: Payer: Self-pay | Admitting: Internal Medicine

## 2023-08-05 VITALS — BP 131/68 | HR 82 | Temp 97.9°F | Ht 64.0 in | Wt 227.2 lb

## 2023-08-05 DIAGNOSIS — R970 Elevated carcinoembryonic antigen [CEA]: Secondary | ICD-10-CM | POA: Diagnosis not present

## 2023-08-05 DIAGNOSIS — C182 Malignant neoplasm of ascending colon: Secondary | ICD-10-CM | POA: Diagnosis not present

## 2023-08-05 DIAGNOSIS — Z90710 Acquired absence of both cervix and uterus: Secondary | ICD-10-CM | POA: Diagnosis not present

## 2023-08-05 DIAGNOSIS — Z85038 Personal history of other malignant neoplasm of large intestine: Secondary | ICD-10-CM | POA: Diagnosis not present

## 2023-08-05 DIAGNOSIS — Z800 Family history of malignant neoplasm of digestive organs: Secondary | ICD-10-CM | POA: Diagnosis not present

## 2023-08-05 DIAGNOSIS — N189 Chronic kidney disease, unspecified: Secondary | ICD-10-CM | POA: Diagnosis not present

## 2023-08-05 DIAGNOSIS — Z8542 Personal history of malignant neoplasm of other parts of uterus: Secondary | ICD-10-CM | POA: Diagnosis not present

## 2023-08-05 DIAGNOSIS — E1122 Type 2 diabetes mellitus with diabetic chronic kidney disease: Secondary | ICD-10-CM | POA: Diagnosis not present

## 2023-08-05 LAB — CEA: CEA: 7.9 ng/mL — ABNORMAL HIGH (ref 0.0–4.7)

## 2023-08-05 NOTE — Progress Notes (Signed)
Pt had a cervical epidural steroid inj done since last visit with Dr. Yves Dill at [facility].  Has trouble sleeping, takes trazodone prn.  Appetite is about 75% normal, drinks 2 glucerna a day.  Has occasional nausea.  SOB/COPD, has beve spi inhaler bid and albuterol prn.

## 2023-08-05 NOTE — Assessment & Plan Note (Addendum)
#   Colon cancer-of ascending colon- stage I; MSI mismatch/sporadic; Excellent prognosis more than 90% chance of cure [see discussion below regarding elevated CEA]; January 2021-CT abdomen pelvis negative for any recurrence. Stable- Follow-up EGD/colonoscopy-  APRIL 2024- polyps-[Dr.Anna] ; wants to continue colonoscopy with GI.   # Abnormal CEA-around AUG 2023- 5-7 ; FEB 2024- 4.7- improved- overall - stable.Low clinical suspicion of any recurrent malignancy.  I again discussed the potential etiologies include autoimmune disease; liver disease; smoking etc.  Pending CEA- stable  # DM-FBS- 163-/ ? CKD- GFR-53- recommend close monitoring with PCP; discussed with patient.   # will call  # DISPOSITION:  # print labs/med list # follow up TBD--Dr.B
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
## 2023-08-09 ENCOUNTER — Telehealth: Payer: Self-pay | Admitting: Internal Medicine

## 2023-08-09 ENCOUNTER — Other Ambulatory Visit: Payer: Self-pay | Admitting: *Deleted

## 2023-08-09 DIAGNOSIS — C182 Malignant neoplasm of ascending colon: Secondary | ICD-10-CM

## 2023-08-09 NOTE — Telephone Encounter (Signed)
#   Unable to reach the patient.  Left a message regarding the CEA slightly elevated.  Again no major concerns for any progressive colon cancer.  Recommend-in 6 months; MD-3 to 4 days prior labs-CBC CMP CEA- Thanks GB

## 2023-08-11 ENCOUNTER — Other Ambulatory Visit: Payer: Self-pay | Admitting: Family Medicine

## 2023-08-16 ENCOUNTER — Other Ambulatory Visit: Payer: Self-pay | Admitting: Family Medicine

## 2023-08-16 DIAGNOSIS — Z1231 Encounter for screening mammogram for malignant neoplasm of breast: Secondary | ICD-10-CM

## 2023-08-21 ENCOUNTER — Other Ambulatory Visit: Payer: Self-pay | Admitting: Family Medicine
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
## 2023-09-13 ENCOUNTER — Ambulatory Visit: Payer: Medicare PPO | Admitting: Family Medicine

## 2023-09-13 ENCOUNTER — Encounter: Payer: Self-pay | Admitting: Family Medicine

## 2023-09-13 VITALS — BP 128/70 | HR 76 | Temp 97.8°F | Resp 16 | Ht 64.0 in | Wt 231.8 lb

## 2023-09-13 DIAGNOSIS — I152 Hypertension secondary to endocrine disorders: Secondary | ICD-10-CM

## 2023-09-13 DIAGNOSIS — M25512 Pain in left shoulder: Secondary | ICD-10-CM

## 2023-09-13 DIAGNOSIS — D692 Other nonthrombocytopenic purpura: Secondary | ICD-10-CM | POA: Diagnosis not present

## 2023-09-13 DIAGNOSIS — E785 Hyperlipidemia, unspecified: Secondary | ICD-10-CM | POA: Diagnosis not present

## 2023-09-13 DIAGNOSIS — E538 Deficiency of other specified B group vitamins: Secondary | ICD-10-CM | POA: Diagnosis not present

## 2023-09-13 DIAGNOSIS — J411 Mucopurulent chronic bronchitis: Secondary | ICD-10-CM | POA: Diagnosis not present

## 2023-09-13 DIAGNOSIS — E1142 Type 2 diabetes mellitus with diabetic polyneuropathy: Secondary | ICD-10-CM

## 2023-09-13 DIAGNOSIS — F325 Major depressive disorder, single episode, in full remission: Secondary | ICD-10-CM

## 2023-09-13 DIAGNOSIS — E1159 Type 2 diabetes mellitus with other circulatory complications: Secondary | ICD-10-CM

## 2023-09-13 DIAGNOSIS — G8929 Other chronic pain: Secondary | ICD-10-CM

## 2023-09-13 DIAGNOSIS — E1169 Type 2 diabetes mellitus with other specified complication: Secondary | ICD-10-CM

## 2023-09-13 DIAGNOSIS — E559 Vitamin D deficiency, unspecified: Secondary | ICD-10-CM

## 2023-09-13 DIAGNOSIS — Z23 Encounter for immunization: Secondary | ICD-10-CM

## 2023-09-13 LAB — POCT GLYCOSYLATED HEMOGLOBIN (HGB A1C): Hemoglobin A1C: 6.9 % — AB (ref 4.0–5.6)

## 2023-09-14 ENCOUNTER — Other Ambulatory Visit: Payer: Self-pay | Admitting: Internal Medicine

## 2023-09-14 DIAGNOSIS — E785 Hyperlipidemia, unspecified: Secondary | ICD-10-CM

## 2023-09-14 DIAGNOSIS — K219 Gastro-esophageal reflux disease without esophagitis: Secondary | ICD-10-CM

## 2023-09-14 DIAGNOSIS — E1169 Type 2 diabetes mellitus with other specified complication: Secondary | ICD-10-CM

## 2023-09-14 DIAGNOSIS — E1121 Type 2 diabetes mellitus with diabetic nephropathy: Secondary | ICD-10-CM

## 2023-09-14 NOTE — Telephone Encounter (Signed)
Requested Prescriptions  Pending Prescriptions Disp Refills   famotidine (PEPCID) 20 MG tablet [Pharmacy Med Name: Famotidine Oral Tablet 20 MG] 180 tablet 1    Sig: TAKE 1 TABLET TWICE DAILY     Gastroenterology:  H2 Antagonists Passed - 09/14/2023  3:37 AM      Passed - Valid encounter within last 12 months    Recent Outpatient Visits           Yesterday Dyslipidemia associated with type 2 diabetes mellitus [facility])   [place] [facility] [place], Danna Hefty, MD   1 month ago Rash and nonspecific skin eruption   [place] [facility] [place], Sheliah Mends, PA-C   4 months ago Dyslipidemia associated with type 2 diabetes mellitus [facility])   [place] [facility] [place], Danna Hefty, MD   8 months ago Dyslipidemia associated with type 2 diabetes mellitus [facility])   [place] [facility] [place], Danna Hefty, MD   12 months ago Dyslipidemia associated with type 2 diabetes mellitus [facility])   [place] [facility] Alba Cory, MD       Future Appointments             In 4 months Alba Cory, MD [facility]             metFORMIN (GLUCOPHAGE-XR) 750 MG 24 hr tablet [Pharmacy Med Name: metFORMIN HCl ER Oral Tablet Extended Release 24 Hour 750 MG] 180 tablet 1    Sig: TAKE 2 TABLETS (1,500 MG TOTAL) DAILY WITH BREAKFAST.     Endocrinology:  Diabetes - Biguanides Failed - 09/14/2023  3:37 AM      Failed - Cr in normal range and within 360 days    Creat  Date Value Ref Range Status  01/03/2023 0.81 0.60 - 1.00 mg/dL Final   Creatinine, Ser  Date Value Ref Range Status  08/04/2023 1.01 (H) 0.44 - 1.00 mg/dL Final   Creatinine, Urine  Date Value Ref Range Status  01/03/2023 36 20 - 275 mg/dL Final         Failed - eGFR in normal range and within 360 days    GFR, Est African American  Date Value Ref Range Status  12/10/2020 77 > OR = 60 mL/min/1.31m2 Final   GFR, Est Non African  American  Date Value Ref Range Status  12/10/2020 67 > OR = 60 mL/min/1.21m2 Final   GFR, Estimated  Date Value Ref Range Status  08/04/2023 57 (L) >60 mL/min Final    Comment:    (NOTE) Calculated using the CKD-EPI Creatinine Equation (2021)    eGFR  Date Value Ref Range Status  01/03/2023 75 > OR = 60 mL/min/1.83m2 Final         Passed - HBA1C is between 0 and 7.9 and within 180 days    Hemoglobin A1C  Date Value Ref Range Status  09/13/2023 6.9 (A) 4.0 - 5.6 % Final   HbA1c, POC (controlled diabetic range)  Date Value Ref Range Status  07/03/2019 9.3 (A) 0.0 - 7.0 % Final         Passed - B12 Level in normal range and within 720 days    Vitamin B-12  Date Value Ref Range Status  01/03/2023 713 200 - 1,100 pg/mL Final         Passed - Valid encounter within last 6 months    Recent Outpatient Visits           Yesterday Dyslipidemia associated  with type 2 diabetes mellitus [facility])   [place] Alba Cory, MD   1 month ago Rash and nonspecific skin eruption   [place] [facility] Danelle Berry, PA-C   4 months ago Dyslipidemia associated with type 2 diabetes mellitus [facility])   [place] [facility] Alba Cory, MD   8 months ago Dyslipidemia associated with type 2 diabetes mellitus [facility])   [place] [facility] Alba Cory, MD   12 months ago Dyslipidemia associated with type 2 diabetes mellitus [facility])   [place] [facility] Alba Cory, MD       Future Appointments             In 4 months Carlynn Purl, Danna Hefty, MD [facility]            Passed - CBC within normal limits and completed in the last 12 months    WBC  Date Value Ref Range Status  08/04/2023 7.5 4.0 - 10.5 K/uL Final   RBC  Date Value Ref Range Status  08/04/2023 4.35 3.87 - 5.11 MIL/uL Final   Hemoglobin  Date Value Ref Range Status  08/04/2023 12.6  12.0 - 15.0 g/dL Final  78/29/5621 30.8 11.1 - 15.9 g/dL Final   HCT  Date Value Ref Range Status  08/04/2023 39.0 36.0 - 46.0 % Final   Hematocrit  Date Value Ref Range Status  11/21/2015 41.6 34.0 - 46.6 % Final   MCHC  Date Value Ref Range Status  08/04/2023 32.3 30.0 - 36.0 g/dL Final   [facility]  Date Value Ref Range Status  08/04/2023 29.0 26.0 - 34.0 pg Final   MCV  Date Value Ref Range Status  08/04/2023 89.7 80.0 - 100.0 fL Final  11/21/2015 83 79 - 97 fL Final   No results found for: "PLTCOUNTKUC", "LABPLAT", "POCPLA" RDW  Date Value Ref Range Status  08/04/2023 15.3 11.5 - 15.5 % Final  11/21/2015 15.8 (H) 12.3 - 15.4 % Final          atorvastatin (LIPITOR) 80 MG tablet [Pharmacy Med Name: Atorvastatin Calcium Oral Tablet 80 MG] 90 tablet 1    Sig: TAKE 1 TABLET EVERY DAY     Cardiovascular:  Antilipid - Statins Failed - 09/14/2023  3:37 AM      Failed - Lipid Panel in normal range within the last 12 months    Cholesterol, Total  Date Value Ref Range Status  11/21/2015 146 100 - 199 mg/dL Final   Cholesterol  Date Value Ref Range Status  01/03/2023 129 <200 mg/dL Final   LDL Cholesterol (Calc)  Date Value Ref Range Status  01/03/2023 45 mg/dL (calc) Final    Comment:    Reference range: <100 . Desirable range <100 mg/dL for primary prevention;   <70 mg/dL for patients with CHD or diabetic patients  with > or = 2 CHD risk factors. [name] LDL-C is now calculated using the Martin-Hopkins  calculation, which is a validated novel method providing  better accuracy than the Friedewald equation in the  estimation of LDL-C.  Horald Pollen et al. Lenox Ahr. 6578;469(62): 2061-2068  (http://education.[employer].com/faq/FAQ164)    HDL  Date Value Ref Range Status  01/03/2023 68 > OR = 50 mg/dL Final  95/28/4132 62 >44 mg/dL Final   Triglycerides  Date Value Ref Range Status  01/03/2023 81 <150 mg/dL Final         Passed - Patient is not pregnant  Passed - Valid encounter within last 12 months    Recent Outpatient Visits           Yesterday Dyslipidemia associated with type 2 diabetes mellitus [facility])   [place] [facility] Alba Cory, MD   1 month ago Rash and nonspecific skin eruption   [place] [facility] Danelle Berry, PA-C   4 months ago Dyslipidemia associated with type 2 diabetes mellitus [facility])   [place] [facility] Alba Cory, MD   8 months ago Dyslipidemia associated with type 2 diabetes mellitus [facility])   [place] [facility] Alba Cory, MD   12 months ago Dyslipidemia associated with type 2 diabetes mellitus [facility])   [place] [facility] Alba Cory, MD       Future Appointments             In 4 months Carlynn Purl, Danna Hefty, MD [facility]

## 2023-09-15 ENCOUNTER — Other Ambulatory Visit: Payer: Self-pay | Admitting: Family Medicine

## 2023-09-15 DIAGNOSIS — R21 Rash and other nonspecific skin eruption: Secondary | ICD-10-CM

## 2023-09-20 ENCOUNTER — Ambulatory Visit: Payer: Self-pay | Admitting: *Deleted

## 2023-09-20 ENCOUNTER — Telehealth: Payer: Self-pay | Admitting: Family Medicine

## 2023-09-20 ENCOUNTER — Other Ambulatory Visit: Payer: Self-pay | Admitting: Family Medicine

## 2023-09-20 ENCOUNTER — Other Ambulatory Visit: Payer: Self-pay

## 2023-09-20 DIAGNOSIS — E1142 Type 2 diabetes mellitus with diabetic polyneuropathy: Secondary | ICD-10-CM

## 2023-09-20 MED ORDER — PREGABALIN 75 MG PO CAPS
75.0000 mg | ORAL_CAPSULE | Freq: Every day | ORAL | 0 refills | Status: DC
Start: 2023-09-20 — End: 2024-01-16

## 2023-09-20 NOTE — Telephone Encounter (Signed)
Summary: med refill / feels like something is crawling under her skin.   Pt stated she needs a medication refill for pregabalin (LYRICA) 75 MG capsule. She stated she feels like something is crawling under her skin.Mentioned it is not a new issue; its just getting more intense. She takes medication for neuropathy.  Medication is not on the active med list.  Seeking clinical advice.     Reason for Disposition . [1] Caller has NON-URGENT medicine question about med that PCP prescribed AND [2] triager unable to answer question  Answer Assessment - Initial Assessment Questions 1. DRUG NAME: "What medicine do you need to have refilled?"     Lyrica 75 mg 2. REFILLS REMAINING: "How many refills are remaining?" (Note: The label on the medicine or pill bottle will show how many refills are remaining. If there are no refills remaining, then a renewal may be needed.)     No refills remaining 3. EXPIRATION DATE: "What is the expiration date?" (Note: The label states when the prescription will expire, and thus can no longer be refilled.)     na 4. PRESCRIBING HCP: "Who prescribed it?" Reason: If prescribed by specialist, call should be referred to that group.     PCP 5. SYMPTOMS: "Do you have any symptoms?"     Patient states she has neuropathy - she states she only takes her medication as needed because it affects her and she still drives. Patient states when she has a "flare" or her neuropathy gets worse- she uses the Lyrica- she states she may take 1 or 2 per day depending on how she feels.  Patient state sshe has not filled this Rx in a while and she forgot to ask for a refill at her most recent appointment.  Protocols used: Medication Refill and Renewal Call-A-AH

## 2023-09-20 NOTE — Telephone Encounter (Signed)
  Chief Complaint: medication refill- lyrica Symptoms: neuropathy Frequency: patient states she always as neuropathy- but it does get worse at times and she uses the lyrica when this happens.  Disposition: [] ED /[] Urgent Care (no appt availability in office) / [] Appointment(In office/virtual)/ []  [place] [facility]/ [] Home Care/ [] Refused Recommended Disposition /[] [place] [facility]/ [x]  Follow-up with PCP Additional Notes: Patient states she has not refilled her medication in a while- only uses it when she needs it- she forgot to ask for Rx at last appointment.

## 2023-09-22 ENCOUNTER — Ambulatory Visit
Admission: RE | Admit: 2023-09-22 | Discharge: 2023-09-22 | Disposition: A | Discharge status: Home / Self Care | Payer: Medicare PPO | Source: Ambulatory Visit | Attending: Family Medicine | Admitting: Family Medicine

## 2023-09-22 DIAGNOSIS — Z1231 Encounter for screening mammogram for malignant neoplasm of breast: Secondary | ICD-10-CM | POA: Diagnosis not present

## 2023-09-28 ENCOUNTER — Other Ambulatory Visit: Payer: Medicare PPO | Admitting: Pharmacist

## 2023-09-28 ENCOUNTER — Other Ambulatory Visit: Payer: Self-pay | Admitting: Family Medicine

## 2023-09-30 ENCOUNTER — Other Ambulatory Visit: Payer: Medicare PPO | Admitting: Pharmacist

## 2023-09-30 ENCOUNTER — Encounter: Payer: Self-pay | Admitting: Pharmacist

## 2023-09-30 NOTE — Patient Instructions (Signed)
Goals Addressed             This Visit's Progress    Pharmacy Goals       Please watch the mail for an envelope from [facility] containing the patient assistance program application. Please complete this application and mail back to [facility] Pharmacy Technician Noreene Larsson [place] along with a copy of your Medicare Part D prescription card and a copy of your proof of income document OR you can bring these documents to the office to have them faxed back to Attention: Pattricia Boss at Fax # [phone]   If you need to call Noreene Larsson, you can reach her at [phone]  If you need to reach out to patient assistance programs regarding refills or to find out the status of your application, you can do so by calling:  [employer] at [phone] [employer] [phone]  Our goal A1c is less than 7%. This corresponds with fasting sugars less than 130 and 2 hour after meal sugars less than 180. Please keep a log of your results when checking your blood sugar   Our goal bad cholesterol, or LDL, is less than 70. This is why it is important to continue taking your atorvastatin.  Check your blood pressure twice weekly, and any time you have concerning symptoms like headache, chest pain, dizziness, shortness of breath, or vision changes.   Our goal is less than 130/80.  To appropriately check your blood pressure, make sure you do the following:  1) Avoid caffeine, exercise, or tobacco products for 30 minutes before checking. Empty your bladder. 2) Sit with your back supported in a flat-backed chair. Rest your arm on something flat (arm of the chair, table, etc). 3) Sit still with your feet flat on the floor, resting, for at least 5 minutes.  4) Check your blood pressure. Take 1-2 readings.  5) Write down these readings and bring with you to any provider appointments.  Bring your home blood pressure machine with you to a provider's office for accuracy comparison at least once a year.   Make sure you take  your blood pressure medications before you come to any office visit, even if you were asked to fast for labs.   Estelle Grumbles, PharmD, [facility] [phone]

## 2023-09-30 NOTE — Progress Notes (Signed)
09/30/2023 Name: Cindy Griffin MRN: [id number] DOB: 1946-10-13  Chief Complaint  Patient presents with   Medication Assistance    Cindy Griffin is a 77 y.o. year old female who presented for a telephone visit.   They were referred to the pharmacist by their PCP for assistance in managing medication access.    Subjective:  Care Team: Primary Care Provider: Alba Cory, MD ; Next Scheduled Visit: 01/16/2024 Cardiologist: [facility]; Next Scheduled Visit: 11/09/2023 Physiatry: Chanda Busing, DO; Next Scheduled Visit:10/17/2023 Oncologist: Earna Coder, MD; Next Scheduled Visit: 02/06/2024  Medication Access/Adherence  Current Pharmacy:  [facility] - [place], [place] - [address] [place] [address] Phone: [phone] Fax: [phone]  [facility] [address]), [place] - [address] [place]) [place] [address] Phone: [phone] Fax: [phone]   Patient reports affordability concerns with their medications: No  Patient reports access/transportation concerns to their pharmacy: No  Patient reports adherence concerns with their medications:  No     Uses weekly pillbox   Diabetes:   Current medications:  - Trulicity 3 mg weekly on Fridays - metformin ER 750 - 2 tablets (1500 mg) daily with breakfast - pioglitazone 15 mg daily   Medications tried in the past: glipizide, Invokana, Soliqua, Synjardy, Victoza   Current morning glucose readings; recalls last checked yesterday after lunch, reading: 136   Patient denies hypoglycemic s/sx including dizziness, shakiness, sweating.    Current physical activity: walks at mall (to walk on even ground) ~30-60 minutes with sister 3-4 times/week   Statin therapy: atorvastatin 80 mg daily   Current medication access support: enrolled in patient assistance for Trulicity from [employer] through  11/29/2023 - Reports has enough Trulicity remaining to last through the end of the calendar year     COPD:   Note patient previously seen by Dr. Meredeth Ide with [facility]   Current medications: - Bevespi inhaler - 2 puffs by mouth twice daily - albuterol inhaler - 2 puffs every 6 hours as needed for wheezing or shortness of breath   Reports has noticed an improvement in her breathing since now using Bevespi inhaler twice daily (previously using once daily). Reports not needing her albuterol as frequently now  Current medication access support: enrolled in patient assistance for Bevespi from [employer] through 11/29/2023 - Reports has enough Bevespi to last through the end of the calendar year - Reports completed the [employer] Writer via text and believes that she received a letter indicating that she was approved for re-enrollment, but not home during our call to review this letter. States that she will confirm when she gets home and send me a MyChart message if anything further needed   Objective:  Lab Results  Component Value Date   HGBA1C 6.9 (A) 09/13/2023    Lab Results  Component Value Date   CREATININE 1.01 (H) 08/04/2023   BUN 24 (H) 08/04/2023   NA 139 08/04/2023   K 4.8 08/04/2023   CL 106 08/04/2023   CO2 24 08/04/2023    Lab Results  Component Value Date   CHOL 129 01/03/2023   HDL 68 01/03/2023   LDLCALC 45 01/03/2023   TRIG 81 01/03/2023   CHOLHDL 1.9 01/03/2023    Medications Reviewed Today     Reviewed by Manuela Neptune, RPH-CPP (Pharmacist) on 09/30/23 at [phone]  Med List Status: <None>   Medication Order Taking? Sig  Documenting Provider Last Dose Status Informant  ACCU-CHEK GUIDE test strip [id number]  USE TO CHECK BLOOD SUGAR ONCE DAILY AS DIRECTED Alba Cory, MD  Active   acetaminophen (TYLENOL) 500 MG tablet [id number]  Take 500 mg by mouth every 6 (six) hours as needed. [provider]  Active   albuterol (VENTOLIN HFA) 108  (90 Base) MCG/ACT inhaler [id number]  INHALE 2 PUFFS EVERY 6 HOURS AS NEEDED FOR WHEEZING OR SHORTNESS OF Ramond Dial, Danna Hefty, MD  Active   Ascorbic Acid (VITAMIN C) 1000 MG tablet [id number]  Take 1,000 mg by mouth daily. [provider]  Active Self  aspirin EC 81 MG tablet [id number]  Take 1 tablet (81 mg total) by mouth daily. Alba Cory, MD  Active Self  atorvastatin (LIPITOR) 80 MG tablet [id number]  TAKE 1 TABLET EVERY DAY Alba Cory, MD  Active   BD PEN NEEDLE MICRO U/F 32G X 6 MM MISC [id number]  USE ONCE DAILY Carlynn Purl, Danna Hefty, MD  Active   Blood Glucose Monitoring Suppl (ACCU-CHEK GUIDE ME) w/Device KIT [id number]  USE TO CHECK BLOOD SUGAR ONCE DAILY AS DIRECTED Alba Cory, MD  Active   carvedilol (COREG) 6.25 MG tablet [id number]  Take 6.25 mg by mouth 2 (two) times daily with a meal. [provider]  Active Self  Cholecalciferol (VITAMIN D-3) 1000 units CAPS [id number]  Take 1,000 Units by mouth daily.  [provider]  Active Self  Dulaglutide (TRULICITY) 3 MG/0.5ML SOPN [id number] Yes Inject 3 mg as directed once a week. Patient receives through [employer] Patient Assistance through Dec 2023 Alba Cory, MD Taking Active   famotidine (PEPCID) 20 MG tablet [id number]  TAKE 1 TABLET TWICE DAILY Carlynn Purl, Danna Hefty, MD  Active   fluticasone (FLONASE) 50 MCG/ACT nasal spray [id number]  Place 2 sprays into both nostrils daily. Alba Cory, MD  Active   furosemide (LASIX) 20 MG tablet [id number]  TAKE 1 TABLET EVERY DAY Sowles, Danna Hefty, MD  Active   Glycopyrrolate-Formoterol (BEVESPI AEROSPHERE) 9-4.8 MCG/ACT AERO [id number] Yes Take 2 puffs by mouth 2 (two) times daily. Patient receives through [employer] Patient Assistance Alba Cory, MD Taking Active   hydrOXYzine (VISTARIL) 25 MG capsule [id number]  Take 1 capsule (25 mg total) by mouth every 8 (eight) hours as needed for itching. Danelle Berry, PA-C  Active   Lancets [facility] DELICA PLUS [place]) MISC  [id number]  CHECK GLUCOSE THREE TIMES DAILY [provider]  Active   lisinopril (ZESTRIL) 20 MG tablet [id number]  Take 1 tablet (20 mg total) by mouth at bedtime. Alba Cory, MD  Active   loratadine (CLARITIN) 10 MG tablet [id number]  TAKE 1 TABLET AT BEDTIME FOR RASH, Burman Blacksmith, Danna Hefty, MD  Active   metFORMIN (GLUCOPHAGE-XR) 750 MG 24 hr tablet [id number] Yes TAKE 2 TABLETS (1,500 MG TOTAL) DAILY WITH BREAKFAST. Alba Cory, MD Taking Active   Multiple Vitamin (MULTIVITAMIN) tablet [id number]  Take 1 tablet by mouth daily. [provider]  Active   Omega-3 1000 MG CAPS [id number]  Take by mouth. [provider]  Active   pioglitazone (ACTOS) 15 MG tablet [id number] Yes TAKE 1 TABLET EVERY DAY Sowles, Danna Hefty, MD Taking Active   polyethylene glycol powder (GLYCOLAX/MIRALAX) 17 GM/SCOOP powder [id number]  Take 1 Container by mouth as needed. Taking 3 times a week [provider]  Active   pregabalin (LYRICA) 75 MG capsule [id number]  Take 1 capsule (75 mg total) by mouth daily. Alba Cory, MD  Active   tiZANidine (ZANAFLEX) 2 MG  tablet [id number]  1 po tid prn [provider]  Active   traMADol (ULTRAM) 50 MG tablet [id number]  Take 25-50 mg by mouth 2 (two) times daily as needed. [provider]  Active   traZODone (DESYREL) 50 MG tablet [id number]  Take 50 mg by mouth at bedtime as needed. [provider]  Active   vitamin B-12 (CYANOCOBALAMIN) 500 MCG tablet [id number]  Take 1,000 mcg by mouth daily. [provider]  Active Self              Assessment/Plan:   Diabetes: - Currently controlled - Reviewed long term cardiovascular and renal outcomes of uncontrolled blood sugar - Reviewed goal A1c, goal fasting, and goal 2 hour post prandial glucose - Encourage patient to have regular well-balanced meals throughout the day, while controlling carbohydrate portion sizes - Recommend to check glucose, keep log of  results and have this record to review at upcoming medical appointments. Patient to contact provider office sooner if needed for readings outside of established parameters or symptoms - Will collaborate with PCP and CPhT to support patient with re-enrollment in patient assistance program for Trulicity from [place] for 2025.     COPD: - Reviewed appropriate inhaler technique. - Reports completed the [employer] Digital Assistant via text and believes that she received a letter indicating that she was approved for re-enrollment for Bevespi patient assistance, but not home during our call to review this letter. States that she will confirm when she gets home and send me a MyChart message if anything further needed       Follow Up Plan: Clinical Pharmacist will follow up with patient by telephone on 12/21/2023 at 10:30 AM    Estelle Grumbles, PharmD, [facility] [phone]

## 2023-10-06 ENCOUNTER — Telehealth: Payer: Self-pay | Admitting: Pharmacy Technician

## 2023-10-06 DIAGNOSIS — Z5986 Financial insecurity: Secondary | ICD-10-CM

## 2023-10-06 NOTE — Progress Notes (Signed)
[facility] Customer service manager [facility])                                            [facility] Quality Pharmacy Team    10/06/2023  ZEHAVA TURSKI 1946/10/06 [id number]                                     Medication Assistance Referral  Referral From:  [facility] PharmD Estelle Grumbles  Medication/Company: Trudee Kuster / Julious Oka Patient application portion:  Mailed Provider application portion: Faxed  to Dr. Alba Cory Provider address/fax verified via: Office website  Pattricia Boss, CPhT [place]  Office: [phone] Fax: [phone] Email: [email or web address]

## 2023-10-23 ENCOUNTER — Other Ambulatory Visit: Payer: Self-pay | Admitting: Family Medicine

## 2023-10-24 ENCOUNTER — Other Ambulatory Visit: Payer: Self-pay

## 2023-10-25 DIAGNOSIS — E119 Type 2 diabetes mellitus without complications: Secondary | ICD-10-CM | POA: Diagnosis not present

## 2023-10-25 DIAGNOSIS — H2513 Age-related nuclear cataract, bilateral: Secondary | ICD-10-CM | POA: Diagnosis not present

## 2023-10-25 LAB — HM DIABETES EYE EXAM

## 2023-11-03 DIAGNOSIS — M5412 Radiculopathy, cervical region: Secondary | ICD-10-CM | POA: Diagnosis not present

## 2023-11-03 DIAGNOSIS — Z79899 Other long term (current) drug therapy: Secondary | ICD-10-CM | POA: Diagnosis not present

## 2023-11-03 DIAGNOSIS — M5416 Radiculopathy, lumbar region: Secondary | ICD-10-CM | POA: Diagnosis not present

## 2023-11-03 DIAGNOSIS — M4802 Spinal stenosis, cervical region: Secondary | ICD-10-CM | POA: Diagnosis not present

## 2023-11-03 DIAGNOSIS — M48062 Spinal stenosis, lumbar region with neurogenic claudication: Secondary | ICD-10-CM | POA: Diagnosis not present

## 2023-11-05 ENCOUNTER — Other Ambulatory Visit: Payer: Self-pay | Admitting: Nurse Practitioner

## 2023-11-07 NOTE — Telephone Encounter (Signed)
Requested Prescriptions  Pending Prescriptions Disp Refills   albuterol (VENTOLIN HFA) 108 (90 Base) MCG/ACT inhaler [Pharmacy Med Name: Albuterol Sulfate HFA Inhalation Aerosol Solution 108 (90 Base) MCG/ACT] 2 each 0    Sig: INHALE 2 PUFFS EVERY 6 HOURS AS NEEDED FOR WHEEZING OR SHORTNESS OF BREATH     Pulmonology:  [facility] Agonists 2 Passed - 11/05/2023  3:16 AM      Passed - Last BP in normal range    BP Readings from Last 1 Encounters:  09/13/23 128/70         Passed - Last Heart Rate in normal range    Pulse Readings from Last 1 Encounters:  09/13/23 76         Passed - Valid encounter within last 12 months    Recent Outpatient Visits           1 month ago Dyslipidemia associated with type 2 diabetes mellitus [facility])   [facility] [place], Danna Hefty, MD   3 months ago Rash and nonspecific skin eruption   [place] [facility] [place], Sheliah Mends, PA-C   5 months ago Dyslipidemia associated with type 2 diabetes mellitus [facility])   [place] [facility] [place], Danna Hefty, MD   10 months ago Dyslipidemia associated with type 2 diabetes mellitus [facility])   [facility] Alba Cory, MD   1 year ago Dyslipidemia associated with type 2 diabetes mellitus [facility])   [place] [facility] Alba Cory, MD       Future Appointments             In 2 months Carlynn Purl, Danna Hefty, MD [facility], [facility]

## 2023-11-08 ENCOUNTER — Telehealth: Payer: Self-pay | Admitting: Pharmacy Technician

## 2023-11-08 DIAGNOSIS — M5412 Radiculopathy, cervical region: Secondary | ICD-10-CM | POA: Diagnosis not present

## 2023-11-08 DIAGNOSIS — M4802 Spinal stenosis, cervical region: Secondary | ICD-10-CM | POA: Diagnosis not present

## 2023-11-08 DIAGNOSIS — Z5986 Financial insecurity: Secondary | ICD-10-CM

## 2023-11-08 NOTE — Progress Notes (Signed)
Pharmacy Medication Assistance Program Note    11/08/2023  Patient ID: Cindy Griffin, female   DOB: 01-Jul-1946, 77 y.o.   MRN: [id number]     11/08/2023  Outreach Medication One  Initial Outreach Date (Medication One) 09/30/2023  Manufacturer Medication One [employer] Trulicity  Dose of Trulicity 3mg /0.70ml  Type of Radiographer, therapeutic Assistance  Date Application Sent to Patient 10/05/2023  Application Items Requested Application;Proof of Income;Other  Date Application Sent to Prescriber 10/05/2023  Name of Prescriber Alba Cory  Date Application Received From Patient 11/02/2023  Application Items Received From Patient Application;Other  Date Application Received From Provider 10/19/2023  Date Application Submitted to Manufacturer 11/08/2023  Method Application Sent to Manufacturer Fax       Signature  Kristopher Glee [facility]  Office: [phone] Fax: [phone] Email: [email or web address]

## 2023-11-17 ENCOUNTER — Other Ambulatory Visit: Payer: Self-pay | Admitting: Internal Medicine

## 2023-11-17 DIAGNOSIS — E782 Mixed hyperlipidemia: Secondary | ICD-10-CM | POA: Diagnosis not present

## 2023-11-17 DIAGNOSIS — I10 Essential (primary) hypertension: Secondary | ICD-10-CM | POA: Diagnosis not present

## 2023-11-17 DIAGNOSIS — E66812 Obesity, class 2: Secondary | ICD-10-CM | POA: Diagnosis not present

## 2023-11-17 DIAGNOSIS — R0602 Shortness of breath: Secondary | ICD-10-CM | POA: Diagnosis not present

## 2023-11-17 DIAGNOSIS — E119 Type 2 diabetes mellitus without complications: Secondary | ICD-10-CM | POA: Diagnosis not present

## 2023-11-17 DIAGNOSIS — E6609 Other obesity due to excess calories: Secondary | ICD-10-CM | POA: Diagnosis not present

## 2023-11-17 DIAGNOSIS — R600 Localized edema: Secondary | ICD-10-CM | POA: Diagnosis not present

## 2023-11-17 DIAGNOSIS — Z6838 Body mass index (BMI) 38.0-38.9, adult: Secondary | ICD-10-CM | POA: Diagnosis not present

## 2023-11-27 ENCOUNTER — Other Ambulatory Visit: Payer: Self-pay | Admitting: Family Medicine

## 2023-11-27 DIAGNOSIS — I10 Essential (primary) hypertension: Secondary | ICD-10-CM

## 2023-11-27 DIAGNOSIS — E1142 Type 2 diabetes mellitus with diabetic polyneuropathy: Secondary | ICD-10-CM

## 2023-11-27 DIAGNOSIS — J3089 Other allergic rhinitis: Secondary | ICD-10-CM

## 2023-12-01 DIAGNOSIS — M1611 Unilateral primary osteoarthritis, right hip: Secondary | ICD-10-CM | POA: Diagnosis not present

## 2023-12-09 ENCOUNTER — Telehealth: Payer: Self-pay | Admitting: Pharmacy Technician

## 2023-12-09 DIAGNOSIS — Z5986 Financial insecurity: Secondary | ICD-10-CM

## 2023-12-09 NOTE — Progress Notes (Signed)
 Pharmacy Medication Assistance Program Note    12/09/2023  Patient ID: Cindy Griffin, female   DOB: 01-15-1946, 78 y.o.   MRN: [id number]     11/08/2023 12/09/2023  Outreach Medication One  Initial Outreach Date (Medication One) 09/30/2023   Manufacturer Medication One [employer] Trulicity    Dose of Trulicity  3mg /0.85ml   Type of Radiographer, Therapeutic Assistance   Date Application Sent to Patient 10/05/2023   Application Items Requested Application;Proof of Income;Other   Date Application Sent to Prescriber 10/05/2023   Name of Prescriber Dorette Loron   Date Application Received From Patient 11/02/2023   Application Items Received From Patient Application;Other   Date Application Received From Provider 10/19/2023   Date Application Submitted to Manufacturer 11/08/2023   Method Application Sent to Manufacturer Fax   Patient Assistance Determination  Approved  Approval Start Date  11/30/2023  Approval End Date  11/28/2024  Additional Outreach Contact  Provider  Contacted Provider  Message    Care coordination call placed to [employer]  in regard to Trulicity  application. Spoke to Kristin who informs patient is APPROVED 11/30/23-11/28/24. Medication will auto fill and ship to patient's home. Patient may call [employer] at any time to check on next shipment by calling [phone]. Per Kristin, patient last received a 4 month supply on 09/15/23. She informs next shipment should arrive around the first of February. Will send in basket message to patient to  request she notify patient at their next schedule office visit.  Kate Caddy, CPhT [place]  Office: [phone] Fax: [phone] Email: [email or web address]  Signature

## 2023-12-14 ENCOUNTER — Other Ambulatory Visit: Payer: Self-pay | Admitting: Family Medicine

## 2023-12-20 ENCOUNTER — Ambulatory Visit
Admission: RE | Admit: 2023-12-20 | Discharge: 2023-12-20 | Disposition: A | Discharge status: Home / Self Care | Payer: Self-pay | Source: Ambulatory Visit | Attending: Internal Medicine | Admitting: Internal Medicine

## 2023-12-20 DIAGNOSIS — R0602 Shortness of breath: Secondary | ICD-10-CM | POA: Insufficient documentation

## 2023-12-21 ENCOUNTER — Other Ambulatory Visit: Payer: Self-pay | Admitting: Pharmacist

## 2023-12-21 DIAGNOSIS — E1142 Type 2 diabetes mellitus with diabetic polyneuropathy: Secondary | ICD-10-CM

## 2023-12-21 NOTE — Patient Instructions (Signed)
Goals Addressed             This Visit's Progress    Pharmacy Goals       If you need to reach out to patient assistance programs regarding refills or to find out the status of your application, you can do so by calling:  AZ[employer]Me at [phone] [employer] [phone]  Our goal A1c is less than 7%. This corresponds with fasting sugars less than 130 and 2 hour after meal sugars less than 180. Please keep a log of your results when checking your blood sugar   Our goal bad cholesterol, or LDL, is less than 70. This is why it is important to continue taking your atorvastatin.  Check your blood pressure twice weekly, and any time you have concerning symptoms like headache, chest pain, dizziness, shortness of breath, or vision changes.   Our goal is less than 130/80.  To appropriately check your blood pressure, make sure you do the following:  1) Avoid caffeine, exercise, or tobacco products for 30 minutes before checking. Empty your bladder. 2) Sit with your back supported in a flat-backed chair. Rest your arm on something flat (arm of the chair, table, etc). 3) Sit still with your feet flat on the floor, resting, for at least 5 minutes.  4) Check your blood pressure. Take 1-2 readings.  5) Write down these readings and bring with you to any provider appointments.  Bring your home blood pressure machine with you to a provider's office for accuracy comparison at least once a year.   Make sure you take your blood pressure medications before you come to any office visit, even if you were asked to fast for labs.   Estelle Grumbles, PharmD, [facility] [phone]

## 2023-12-21 NOTE — Progress Notes (Signed)
12/21/2023 Name: LYNZEY STALLCUP MRN: [id number] DOB: [birth year]  Chief Complaint  Patient presents with   Medication Assistance    Cindy Griffin is a 78 y.o. year old female who presented for a telephone visit.   They were referred to the pharmacist by their PCP for assistance in managing medication access.      Subjective:   Care Team: Primary Care Provider: Alba Cory, MD ; Next Scheduled Visit: 01/16/2024 Cardiologist: [facility] Physiatry: Yves Dill Berneda Rose, DO; Next Scheduled Visit:02/06/2024 Oncologist: Earna Coder, MD; Next Scheduled Visit: 02/06/2024 ENT Specialist: Linus Salmons   Medication Access/Adherence  Current Pharmacy:  [facility] - [place], [place] - [address] [place] [address] Phone: [phone] Fax: [phone]  [facility] [address]), [place] - [address] [place]) [place] [address] Phone: [phone] Fax: [phone]  [facility] - [place], [place] - [address] [place] [address] Phone: [phone] Fax: [phone]   Patient reports affordability concerns with their medications: No  Patient reports access/transportation concerns to their pharmacy: No  Patient reports adherence concerns with their medications:  No     Uses weekly pillbox  Reports that she received a letter from [facility] stating that Dr. Carlynn Purl would no longer be in network with her plan starting in February. States that she will contact office and her [employer] plan to ask for more information/to confirm   Diabetes:   Current medications:  - Trulicity 3 mg weekly on Fridays - metformin ER 750 - 2 tablets (1500 mg) daily with breakfast - pioglitazone 15 mg daily   Medications tried in the past: glipizide, Invokana, Soliqua, Synjardy, Victoza   Recent blood sugar  readings ranging 110-137   Patient denies hypoglycemic s/sx including dizziness, shakiness, sweating.    Current physical activity: walks at mall (to walk on even ground) ~30-60 minutes with sister 3-4 times/week   Statin therapy: atorvastatin 80 mg daily   Current medication access support: enrolled in patient assistance for Trulicity from [employer] through 11/28/2024 - Confirms has already received a shipment of Trulicity this calendar year     COPD:   Note patient previously seen by Dr. Meredeth Ide with [facility]   Current medications: - Bevespi inhaler - 2 puffs by mouth twice daily - albuterol inhaler - 2 puffs every 6 hours as needed for wheezing or shortness of breath    Current medication access support: enrolled in patient assistance for Bevespi from [employer] through 11/28/2024 - Reports received a shipment of Bevespi last week     Objective:  Lab Results  Component Value Date   HGBA1C 6.9 (A) 09/13/2023    Lab Results  Component Value Date   CREATININE 1.01 (H) 08/04/2023   BUN 24 (H) 08/04/2023   NA 139 08/04/2023   K 4.8 08/04/2023   CL 106 08/04/2023   CO2 24 08/04/2023    Lab Results  Component Value Date   CHOL 129 01/03/2023   HDL 68 01/03/2023   LDLCALC 45 01/03/2023   TRIG 81 01/03/2023   CHOLHDL 1.9 01/03/2023    Medications Reviewed Today     Reviewed by Manuela Neptune, RPH-CPP (Pharmacist) on 12/21/23 at 1057  Med List Status: <None>   Medication Order Taking? Sig Documenting Provider Last Dose Status Informant  ACCU-CHEK GUIDE test strip [id number]  USE TO CHECK BLOOD SUGAR ONCE DAILY  AS DIRECTED Alba Cory, MD  Active   acetaminophen (TYLENOL) 500 MG tablet [id number]  Take 500 mg by mouth every 6 (six) hours as needed. [provider]  Active   albuterol (VENTOLIN HFA) 108 (90 Base) MCG/ACT inhaler [id number]  INHALE 2 PUFFS EVERY 6 HOURS AS NEEDED FOR WHEEZING OR SHORTNESS OF Ramond Dial, Danna Hefty, MD  Active   Ascorbic  Acid (VITAMIN C) 1000 MG tablet [id number]  Take 1,000 mg by mouth daily. [provider]  Active Self  aspirin EC 81 MG tablet [id number]  Take 1 tablet (81 mg total) by mouth daily. Alba Cory, MD  Active Self  atorvastatin (LIPITOR) 80 MG tablet [id number]  TAKE 1 TABLET EVERY DAY Alba Cory, MD  Active   BD PEN NEEDLE MICRO U/F 32G X 6 MM MISC [id number]  USE ONCE DAILY Carlynn Purl, Danna Hefty, MD  Active   Blood Glucose Monitoring Suppl (ACCU-CHEK GUIDE ME) w/Device KIT [id number]  USE TO CHECK BLOOD SUGAR ONCE DAILY AS DIRECTED Alba Cory, MD  Active   carvedilol (COREG) 6.25 MG tablet [id number]  Take 6.25 mg by mouth 2 (two) times daily with a meal. [provider]  Active Self  Cholecalciferol (VITAMIN D-3) 1000 units CAPS [id number]  Take 1,000 Units by mouth daily.  [provider]  Active Self  famotidine (PEPCID) 20 MG tablet [id number]  TAKE 1 TABLET TWICE DAILY Sowles, Danna Hefty, MD  Active   fluticasone [facility]) 50 MCG/ACT nasal spray [id number]  USE 2 SPRAYS IN EACH NOSTRIL EVERY Mliss Fritz, MD  Active   furosemide (LASIX) 20 MG tablet [id number]  TAKE 1 TABLET EVERY DAY Sowles, Danna Hefty, MD  Active   Glycopyrrolate-Formoterol (BEVESPI AEROSPHERE) 9-4.8 MCG/ACT AERO [id number]  Take 2 puffs by mouth 2 (two) times daily. Patient receives through [employer] Patient Assistance Alba Cory, MD  Active   hydrOXYzine (VISTARIL) 25 MG capsule [id number]  Take 1 capsule (25 mg total) by mouth every 8 (eight) hours as needed for itching. Danelle Berry, PA-C  Active   Lancets [facility] DELICA PLUS [place]) MISC [id number]  CHECK GLUCOSE THREE TIMES DAILY [provider]  Active   lisinopril (ZESTRIL) 20 MG tablet [id number]  TAKE 1 TABLET AT BEDTIME (NEW DOSE) Alba Cory, MD  Active   loratadine (CLARITIN) 10 MG tablet [id number]  TAKE 1 TABLET AT BEDTIME FOR RASH, Elliot Gault, MD  Active   metFORMIN (GLUCOPHAGE-XR) 750 MG 24 hr tablet  [id number] Yes TAKE 2 TABLETS (1,500 MG TOTAL) DAILY WITH BREAKFAST. Alba Cory, MD Taking Active   Multiple Vitamin (MULTIVITAMIN) tablet [id number]  Take 1 tablet by mouth daily. [provider]  Active   Omega-3 1000 MG CAPS [id number]  Take by mouth. [provider]  Active   pioglitazone (ACTOS) 15 MG tablet [id number] Yes TAKE 1 TABLET EVERY DAY Sowles, Danna Hefty, MD Taking Active   polyethylene glycol powder (GLYCOLAX/MIRALAX) 17 GM/SCOOP powder [id number]  Take 1 Container by mouth as needed. Taking 3 times a week [provider]  Active   pregabalin (LYRICA) 75 MG capsule [id number]  Take 1 capsule (75 mg total) by mouth daily. Alba Cory, MD  Active   tiZANidine (ZANAFLEX) 2 MG tablet [id number]  1 po tid prn [provider]  Active   traMADol (ULTRAM) 50 MG tablet [id number]  Take 25-50 mg by mouth 2 (two) times daily as needed. [provider]  Active   traZODone (DESYREL) 50 MG tablet [id number]  Take 50 mg by mouth at bedtime as  needed. [provider]  Active   TRULICITY 3 MG/0.5ML [place] [id number] Yes INJECT 3 MG (0.5 ML) UNDER THE SKIN ONCE A WEEK Sowles, Danna Hefty, MD Taking Active   vitamin B-12 (CYANOCOBALAMIN) 500 MCG tablet [id number]  Take 1,000 mcg by mouth daily. [provider]  Active Self              Assessment/Plan:   Diabetes: - Currently controlled - Reviewed long term cardiovascular and renal outcomes of uncontrolled blood sugar - Reviewed goal A1c, goal fasting, and goal 2 hour post prandial glucose - Encourage patient to have regular well-balanced meals throughout the day, while controlling carbohydrate portion sizes - Recommend to check glucose, keep log of results and have this record to review at upcoming medical appointments. Patient to contact provider office sooner if needed for readings outside of established parameters or symptoms - Patient to follow up with [employer] patient assistance program  as needed for refills of Trulicity     COPD: - Reviewed appropriate inhaler technique. - Patient to follow up with [employer] patient assistance program as needed for refills of Bevespi       Follow Up Plan: Clinical Pharmacist will follow up with patient by telephone on 04/11/2024 at 8:30 AM    Estelle Grumbles, PharmD, [facility] [phone]

## 2023-12-30 DIAGNOSIS — H6122 Impacted cerumen, left ear: Secondary | ICD-10-CM | POA: Diagnosis not present

## 2023-12-30 DIAGNOSIS — R42 Dizziness and giddiness: Secondary | ICD-10-CM | POA: Diagnosis not present

## 2023-12-30 DIAGNOSIS — H903 Sensorineural hearing loss, bilateral: Secondary | ICD-10-CM | POA: Diagnosis not present

## 2023-12-30 DIAGNOSIS — H9312 Tinnitus, left ear: Secondary | ICD-10-CM | POA: Diagnosis not present

## 2024-01-05 DIAGNOSIS — H8112 Benign paroxysmal vertigo, left ear: Secondary | ICD-10-CM | POA: Diagnosis not present

## 2024-01-06 ENCOUNTER — Other Ambulatory Visit: Payer: Self-pay | Admitting: Family Medicine

## 2024-01-06 NOTE — Telephone Encounter (Signed)
 Requested medication (s) are due for refill today:   Yes  Requested medication (s) are on the active medication list:   Yes  Future visit scheduled:   Yes   Last ordered: 10/24/2023 #90, 0 refills  Unable to refill because labs are due per protocol   Requested Prescriptions  Pending Prescriptions Disp Refills   furosemide  (LASIX ) 20 MG tablet [Pharmacy Med Name: Furosemide  Oral Tablet 20 MG] 90 tablet 3    Sig: TAKE 1 TABLET EVERY DAY     Cardiovascular:  Diuretics - Loop Failed - 01/06/2024  2:58 PM      Failed - Cr in normal range and within 180 days    Creat  Date Value Ref Range Status  01/03/2023 0.81 0.60 - 1.00 mg/dL Final   Creatinine, Ser  Date Value Ref Range Status  08/04/2023 1.01 (H) 0.44 - 1.00 mg/dL Final   Creatinine, Urine  Date Value Ref Range Status  01/03/2023 36 20 - 275 mg/dL Final         Failed - Mg Level in normal range and within 180 days    Magnesium   Date Value Ref Range Status  05/30/2018 1.8 1.7 - 2.4 mg/dL Final    Comment:    Performed at [facility], [address] [place], [place] [address]         Passed - K in normal range and within 180 days    Potassium  Date Value Ref Range Status  08/04/2023 4.8 3.5 - 5.1 mmol/L Final         Passed - Ca in normal range and within 180 days    Calcium   Date Value Ref Range Status  08/04/2023 9.4 8.9 - 10.3 mg/dL Final         Passed - Na in normal range and within 180 days    Sodium  Date Value Ref Range Status  08/04/2023 139 135 - 145 mmol/L Final  11/21/2015 141 134 - 144 mmol/L Final         Passed - Cl in normal range and within 180 days    Chloride  Date Value Ref Range Status  08/04/2023 106 98 - 111 mmol/L Final         Passed - Last BP in normal range    BP Readings from Last 1 Encounters:  09/13/23 128/70         Passed - Valid encounter within last 6 months    Recent Outpatient Visits           3 months ago Dyslipidemia associated with type 2  diabetes mellitus [facility])   [facility], [place], MD   5 months ago Rash and nonspecific skin eruption   [facility] Leavy Mole, PA-C   7 months ago Dyslipidemia associated with type 2 diabetes mellitus [facility]   [place] [facility] Sowles, Krichna, MD   1 year ago Dyslipidemia associated with type 2 diabetes mellitus [facility])   [facility] Sowles, Krichna, MD   1 year ago Dyslipidemia associated with type 2 diabetes mellitus [facility])   [facility] Sowles, Krichna, MD       Future Appointments             In 1 week Sowles, Krichna, MD [facility]

## 2024-01-16 ENCOUNTER — Encounter: Payer: Self-pay | Admitting: Family Medicine

## 2024-01-16 ENCOUNTER — Ambulatory Visit (INDEPENDENT_AMBULATORY_CARE_PROVIDER_SITE_OTHER): Payer: Medicare PPO | Admitting: Family Medicine

## 2024-01-16 VITALS — BP 128/72 | HR 74 | Resp 16 | Ht 64.0 in | Wt 224.9 lb

## 2024-01-16 DIAGNOSIS — F325 Major depressive disorder, single episode, in full remission: Secondary | ICD-10-CM | POA: Diagnosis not present

## 2024-01-16 DIAGNOSIS — J411 Mucopurulent chronic bronchitis: Secondary | ICD-10-CM

## 2024-01-16 DIAGNOSIS — E538 Deficiency of other specified B group vitamins: Secondary | ICD-10-CM

## 2024-01-16 DIAGNOSIS — R42 Dizziness and giddiness: Secondary | ICD-10-CM

## 2024-01-16 DIAGNOSIS — D692 Other nonthrombocytopenic purpura: Secondary | ICD-10-CM

## 2024-01-16 DIAGNOSIS — E1159 Type 2 diabetes mellitus with other circulatory complications: Secondary | ICD-10-CM

## 2024-01-16 DIAGNOSIS — I700 Atherosclerosis of aorta: Secondary | ICD-10-CM

## 2024-01-16 DIAGNOSIS — E1142 Type 2 diabetes mellitus with diabetic polyneuropathy: Secondary | ICD-10-CM

## 2024-01-16 DIAGNOSIS — E559 Vitamin D deficiency, unspecified: Secondary | ICD-10-CM | POA: Diagnosis not present

## 2024-01-16 DIAGNOSIS — E785 Hyperlipidemia, unspecified: Secondary | ICD-10-CM | POA: Diagnosis not present

## 2024-01-16 DIAGNOSIS — I152 Hypertension secondary to endocrine disorders: Secondary | ICD-10-CM | POA: Diagnosis not present

## 2024-01-16 DIAGNOSIS — E1169 Type 2 diabetes mellitus with other specified complication: Secondary | ICD-10-CM | POA: Diagnosis not present

## 2024-01-16 LAB — POCT GLYCOSYLATED HEMOGLOBIN (HGB A1C): Hemoglobin A1C: 6.8 % — AB (ref 4.0–5.6)

## 2024-01-16 MED ORDER — PIOGLITAZONE HCL 15 MG PO TABS: 15.0000 mg | ORAL_TABLET | Freq: Every day | ORAL | 3 refills | Status: DC

## 2024-01-16 MED ORDER — PREGABALIN 75 MG PO CAPS: 75.0000 mg | ORAL_CAPSULE | Freq: Every day | ORAL | 0 refills | Status: DC
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
## 2024-01-16 NOTE — Patient Instructions (Signed)
Talk to Dr. Council Mechanic about the arm numbness

## 2024-01-17 ENCOUNTER — Encounter: Payer: Self-pay | Admitting: Family Medicine

## 2024-01-17 LAB — COMPLETE METABOLIC PANEL WITH GFR
AG Ratio: 1.7 (calc) (ref 1.0–2.5)
ALT: 16 U/L (ref 6–29)
AST: 16 U/L (ref 10–35)
Albumin: 4.4 g/dL (ref 3.6–5.1)
Alkaline phosphatase (APISO): 78 U/L (ref 37–153)
BUN: 15 mg/dL (ref 7–25)
CO2: 29 mmol/L (ref 20–32)
Calcium: 9.6 mg/dL (ref 8.6–10.4)
Chloride: 103 mmol/L (ref 98–110)
Creat: 0.85 mg/dL (ref 0.60–1.00)
Globulin: 2.6 g/dL (ref 1.9–3.7)
Glucose, Bld: 134 mg/dL — ABNORMAL HIGH (ref 65–99)
Potassium: 4.7 mmol/L (ref 3.5–5.3)
Sodium: 140 mmol/L (ref 135–146)
Total Bilirubin: 0.4 mg/dL (ref 0.2–1.2)
Total Protein: 7 g/dL (ref 6.1–8.1)
eGFR: 71 mL/min/{1.73_m2} (ref >=60)

## 2024-01-17 LAB — LIPID PANEL
Cholesterol: 139 mg/dL (ref <200)
HDL: 69 mg/dL (ref >=50)
LDL Cholesterol (Calc): 53 mg/dL
Non-HDL Cholesterol (Calc): 70 mg/dL (ref <130)
Total CHOL/HDL Ratio: 2 (calc) (ref <5.0)
Triglycerides: 88 mg/dL (ref <150)

## 2024-01-17 LAB — MICROALBUMIN / CREATININE URINE RATIO
Creatinine, Urine: 41 mg/dL (ref 20–275)
Microalb Creat Ratio: 10 mg/g{creat} (ref <30)
Microalb, Ur: 0.4 mg/dL

## 2024-01-17 LAB — B12 AND FOLATE PANEL
Folate: >24 ng/mL
Vitamin B-12: 551 pg/mL (ref 200–1100)

## 2024-01-17 LAB — VITAMIN D 25 HYDROXY (VIT D DEFICIENCY, FRACTURES): Vit D, 25-Hydroxy: 35 ng/mL (ref 30–100)

## 2024-01-19 ENCOUNTER — Other Ambulatory Visit: Payer: Self-pay | Admitting: Family Medicine

## 2024-01-30 ENCOUNTER — Inpatient Hospital Stay: Payer: Medicare PPO | Attending: Internal Medicine

## 2024-01-30 DIAGNOSIS — M2550 Pain in unspecified joint: Secondary | ICD-10-CM | POA: Insufficient documentation

## 2024-01-30 DIAGNOSIS — C182 Malignant neoplasm of ascending colon: Secondary | ICD-10-CM | POA: Insufficient documentation

## 2024-01-30 DIAGNOSIS — Z7985 Long-term (current) use of injectable non-insulin antidiabetic drugs: Secondary | ICD-10-CM | POA: Diagnosis not present

## 2024-01-30 DIAGNOSIS — I10 Essential (primary) hypertension: Secondary | ICD-10-CM | POA: Diagnosis not present

## 2024-01-30 DIAGNOSIS — G4700 Insomnia, unspecified: Secondary | ICD-10-CM | POA: Insufficient documentation

## 2024-01-30 DIAGNOSIS — G8929 Other chronic pain: Secondary | ICD-10-CM | POA: Insufficient documentation

## 2024-01-30 DIAGNOSIS — K219 Gastro-esophageal reflux disease without esophagitis: Secondary | ICD-10-CM | POA: Diagnosis not present

## 2024-01-30 DIAGNOSIS — Z79899 Other long term (current) drug therapy: Secondary | ICD-10-CM | POA: Diagnosis not present

## 2024-01-30 DIAGNOSIS — Z7982 Long term (current) use of aspirin: Secondary | ICD-10-CM | POA: Insufficient documentation

## 2024-01-30 DIAGNOSIS — E559 Vitamin D deficiency, unspecified: Secondary | ICD-10-CM | POA: Diagnosis not present

## 2024-01-30 DIAGNOSIS — R110 Nausea: Secondary | ICD-10-CM | POA: Insufficient documentation

## 2024-01-30 DIAGNOSIS — Z7984 Long term (current) use of oral hypoglycemic drugs: Secondary | ICD-10-CM | POA: Insufficient documentation

## 2024-01-30 DIAGNOSIS — E785 Hyperlipidemia, unspecified: Secondary | ICD-10-CM | POA: Diagnosis not present

## 2024-01-30 DIAGNOSIS — J449 Chronic obstructive pulmonary disease, unspecified: Secondary | ICD-10-CM | POA: Insufficient documentation

## 2024-01-30 DIAGNOSIS — E119 Type 2 diabetes mellitus without complications: Secondary | ICD-10-CM | POA: Insufficient documentation

## 2024-01-30 LAB — CMP (CANCER CENTER ONLY)
ALT: 18 U/L (ref 0–44)
AST: 22 U/L (ref 15–41)
Albumin: 4 g/dL (ref 3.5–5.0)
Alkaline Phosphatase: 65 U/L (ref 38–126)
Anion gap: 9 (ref 5–15)
BUN: 13 mg/dL (ref 8–23)
CO2: 27 mmol/L (ref 22–32)
Calcium: 9.2 mg/dL (ref 8.9–10.3)
Chloride: 104 mmol/L (ref 98–111)
Creatinine: 0.92 mg/dL (ref 0.44–1.00)
GFR, Estimated: >60 mL/min (ref >60)
Glucose, Bld: 124 mg/dL — ABNORMAL HIGH (ref 70–99)
Potassium: 4.2 mmol/L (ref 3.5–5.1)
Sodium: 140 mmol/L (ref 135–145)
Total Bilirubin: 0.6 mg/dL (ref 0.0–1.2)
Total Protein: 7 g/dL (ref 6.5–8.1)

## 2024-01-30 LAB — CBC WITH DIFFERENTIAL (CANCER CENTER ONLY)
Abs Immature Granulocytes: 0.02 10*3/uL (ref 0.00–0.07)
Basophils Absolute: 0.1 10*3/uL (ref 0.0–0.1)
Basophils Relative: 1 %
Eosinophils Absolute: 0.2 10*3/uL (ref 0.0–0.5)
Eosinophils Relative: 3 %
HCT: 35.8 % — ABNORMAL LOW (ref 36.0–46.0)
Hemoglobin: 11.6 g/dL — ABNORMAL LOW (ref 12.0–15.0)
Immature Granulocytes: 0 %
Lymphocytes Relative: 28 %
Lymphs Abs: 1.6 10*3/uL (ref 0.7–4.0)
MCH: 28.6 pg (ref 26.0–34.0)
MCHC: 32.4 g/dL (ref 30.0–36.0)
MCV: 88.4 fL (ref 80.0–100.0)
Monocytes Absolute: 0.5 10*3/uL (ref 0.1–1.0)
Monocytes Relative: 9 %
Neutro Abs: 3.4 10*3/uL (ref 1.7–7.7)
Neutrophils Relative %: 59 %
Platelet Count: 259 10*3/uL (ref 150–400)
RBC: 4.05 MIL/uL (ref 3.87–5.11)
RDW: 14.7 % (ref 11.5–15.5)
WBC Count: 5.7 10*3/uL (ref 4.0–10.5)
nRBC: 0 % (ref 0.0–0.2)

## 2024-01-31 LAB — CEA: CEA: 6.6 ng/mL — ABNORMAL HIGH (ref 0.0–4.7)

## 2024-02-01 ENCOUNTER — Other Ambulatory Visit: Payer: Medicare PPO

## 2024-02-06 ENCOUNTER — Ambulatory Visit: Payer: Medicare PPO | Admitting: Internal Medicine

## 2024-02-09 ENCOUNTER — Other Ambulatory Visit: Payer: Self-pay | Admitting: Family Medicine

## 2024-02-09 DIAGNOSIS — K219 Gastro-esophageal reflux disease without esophagitis: Secondary | ICD-10-CM

## 2024-02-09 DIAGNOSIS — E785 Hyperlipidemia, unspecified: Secondary | ICD-10-CM

## 2024-02-09 DIAGNOSIS — E1169 Type 2 diabetes mellitus with other specified complication: Secondary | ICD-10-CM

## 2024-02-09 DIAGNOSIS — E1121 Type 2 diabetes mellitus with diabetic nephropathy: Secondary | ICD-10-CM

## 2024-02-13 ENCOUNTER — Other Ambulatory Visit: Payer: Self-pay

## 2024-02-13 ENCOUNTER — Inpatient Hospital Stay (HOSPITAL_BASED_OUTPATIENT_CLINIC_OR_DEPARTMENT_OTHER): Payer: Medicare PPO | Admitting: Internal Medicine

## 2024-02-13 ENCOUNTER — Encounter: Payer: Self-pay | Admitting: Internal Medicine

## 2024-02-13 VITALS — BP 128/64 | HR 75 | Temp 97.1°F | Resp 20 | Ht 64.0 in | Wt 225.6 lb

## 2024-02-13 DIAGNOSIS — C182 Malignant neoplasm of ascending colon: Secondary | ICD-10-CM | POA: Diagnosis not present

## 2024-02-13 DIAGNOSIS — Z85038 Personal history of other malignant neoplasm of large intestine: Secondary | ICD-10-CM

## 2024-02-13 MED ORDER — NA SULFATE-K SULFATE-MG SULF 17.5-3.13-1.6 GM/177ML PO SOLN
354.0000 mL | Freq: Once | ORAL | 0 refills | Status: AC
Start: 2024-02-13 — End: 2024-02-13

## 2024-02-13 NOTE — Assessment & Plan Note (Addendum)
#   Colon cancer-of ascending colon- stage I; MSI mismatch/sporadic; Excellent prognosis more than 90% chance of cure [see discussion below regarding elevated CEA]; January 2021-CT abdomen pelvis negative for any recurrence. Stable- Follow-up EGD/colonoscopy-  APRIL 2024- polyps-[Dr.Anna] ; wants to continue colonoscopy with GI. Discussed with Dr.Anna re: pts concerns of not having colo moving forward. Dr.Anna's office will reach out.   # Abnormal CEA-around AUG 2023- 5-7 ; FEB 2024- 4.7- improved- overall - stable.Low clinical suspicion of any recurrent malignancy.  I again discussed the potential etiologies include autoimmune disease; liver disease; smoking etc.  MARCh 2025- 6.7- stable; will repeat labs- in 12 months.  Stable.   # DM-FBS- 163-/ ? CKD- GFR-53- recommend close monitoring with PCP; discussed with patient.   # will call  # DISPOSITION:  # follow up in 12 months- MD:2-3 days prior-  labs- cbc/cmp; CEA Dr.B
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
## 2024-02-13 NOTE — Progress Notes (Signed)
 Having a nerve conduction study rt hand at [facility] 03/06/24.  Appetite up and down, 50% normal, 1-2 glucerna per day, some nausea.

## 2024-02-28 ENCOUNTER — Other Ambulatory Visit: Payer: Self-pay | Admitting: Family Medicine

## 2024-02-29 ENCOUNTER — Other Ambulatory Visit: Payer: Self-pay | Admitting: Family Medicine

## 2024-03-08 ENCOUNTER — Other Ambulatory Visit: Payer: Self-pay | Admitting: Family Medicine

## 2024-03-12 ENCOUNTER — Telehealth: Payer: Self-pay | Admitting: Gastroenterology

## 2024-03-12 ENCOUNTER — Encounter: Payer: Self-pay | Admitting: Gastroenterology

## 2024-03-12 NOTE — Telephone Encounter (Signed)
 Called patient back and she did not pick-up the call. Therefore, I left her voicemail to call me back for questions in reference to her instructions.

## 2024-03-12 NOTE — Telephone Encounter (Signed)
 The patient called in about her instructions.

## 2024-03-13 ENCOUNTER — Encounter
Admission: RE | Disposition: A | Discharge status: Home / Self Care | Payer: Self-pay | Source: Home / Self Care | Attending: Gastroenterology

## 2024-03-13 ENCOUNTER — Ambulatory Visit
Admission: RE | Admit: 2024-03-13 | Discharge: 2024-03-13 | Disposition: A | Discharge status: Home / Self Care | Attending: Gastroenterology | Admitting: Gastroenterology

## 2024-03-13 ENCOUNTER — Ambulatory Visit: Admitting: Anesthesiology

## 2024-03-13 ENCOUNTER — Encounter: Payer: Self-pay | Admitting: Gastroenterology

## 2024-03-13 ENCOUNTER — Other Ambulatory Visit: Payer: Self-pay | Admitting: Family Medicine

## 2024-03-13 DIAGNOSIS — Z1211 Encounter for screening for malignant neoplasm of colon: Secondary | ICD-10-CM

## 2024-03-13 DIAGNOSIS — K5730 Diverticulosis of large intestine without perforation or abscess without bleeding: Secondary | ICD-10-CM | POA: Diagnosis not present

## 2024-03-13 DIAGNOSIS — J449 Chronic obstructive pulmonary disease, unspecified: Secondary | ICD-10-CM | POA: Insufficient documentation

## 2024-03-13 DIAGNOSIS — Z7984 Long term (current) use of oral hypoglycemic drugs: Secondary | ICD-10-CM | POA: Insufficient documentation

## 2024-03-13 DIAGNOSIS — Z7985 Long-term (current) use of injectable non-insulin antidiabetic drugs: Secondary | ICD-10-CM | POA: Diagnosis not present

## 2024-03-13 DIAGNOSIS — I10 Essential (primary) hypertension: Secondary | ICD-10-CM | POA: Diagnosis not present

## 2024-03-13 DIAGNOSIS — G8929 Other chronic pain: Secondary | ICD-10-CM | POA: Insufficient documentation

## 2024-03-13 DIAGNOSIS — Z860101 Personal history of adenomatous and serrated colon polyps: Secondary | ICD-10-CM | POA: Insufficient documentation

## 2024-03-13 DIAGNOSIS — M549 Dorsalgia, unspecified: Secondary | ICD-10-CM | POA: Insufficient documentation

## 2024-03-13 DIAGNOSIS — R42 Dizziness and giddiness: Secondary | ICD-10-CM | POA: Diagnosis not present

## 2024-03-13 DIAGNOSIS — E119 Type 2 diabetes mellitus without complications: Secondary | ICD-10-CM | POA: Diagnosis not present

## 2024-03-13 DIAGNOSIS — E669 Obesity, unspecified: Secondary | ICD-10-CM | POA: Diagnosis not present

## 2024-03-13 DIAGNOSIS — Z85038 Personal history of other malignant neoplasm of large intestine: Secondary | ICD-10-CM | POA: Insufficient documentation

## 2024-03-13 DIAGNOSIS — Z08 Encounter for follow-up examination after completed treatment for malignant neoplasm: Secondary | ICD-10-CM | POA: Diagnosis present

## 2024-03-13 DIAGNOSIS — K219 Gastro-esophageal reflux disease without esophagitis: Secondary | ICD-10-CM | POA: Insufficient documentation

## 2024-03-13 DIAGNOSIS — Z980 Intestinal bypass and anastomosis status: Secondary | ICD-10-CM | POA: Diagnosis not present

## 2024-03-13 DIAGNOSIS — M1990 Unspecified osteoarthritis, unspecified site: Secondary | ICD-10-CM | POA: Insufficient documentation

## 2024-03-13 HISTORY — PX: COLONOSCOPY: SHX5424

## 2024-03-13 LAB — GLUCOSE, CAPILLARY: Glucose-Capillary: 94 mg/dL (ref 70–99)

## 2024-03-13 SURGERY — COLONOSCOPY
Anesthesia: General

## 2024-03-13 MED ORDER — DEXMEDETOMIDINE HCL IN NACL 80 MCG/20ML IV SOLN
INTRAVENOUS | Status: DC | PRN
  Administered 2024-03-13 (×2): 4 ug via INTRAVENOUS

## 2024-03-13 MED ORDER — LIDOCAINE HCL (PF) 2 % IJ SOLN
INTRAMUSCULAR | Status: AC
  Filled 2024-03-13: qty 5

## 2024-03-13 MED ORDER — PROPOFOL 500 MG/50ML IV EMUL
INTRAVENOUS | Status: DC | PRN
  Administered 2024-03-13: 150 ug/kg/min via INTRAVENOUS

## 2024-03-13 MED ORDER — LIDOCAINE HCL (CARDIAC) PF 100 MG/5ML IV SOSY
PREFILLED_SYRINGE | INTRAVENOUS | Status: DC | PRN
  Administered 2024-03-13: 40 mg via INTRAVENOUS

## 2024-03-13 MED ORDER — SODIUM CHLORIDE 0.9 % IV SOLN: INTRAVENOUS | Status: DC

## 2024-03-13 MED ORDER — PROPOFOL 1000 MG/100ML IV EMUL
INTRAVENOUS | Status: AC
  Filled 2024-03-13: qty 100

## 2024-03-13 MED ORDER — PROPOFOL 10 MG/ML IV BOLUS
INTRAVENOUS | Status: DC | PRN
  Administered 2024-03-13 (×2): 10 mg via INTRAVENOUS
  Administered 2024-03-13: 70 mg via INTRAVENOUS
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
## 2024-03-13 NOTE — Anesthesia Postprocedure Evaluation (Signed)
 Anesthesia Post Note  Patient: Cindy Griffin  Procedure(s) Performed: COLONOSCOPY  Patient location during evaluation: PACU Anesthesia Type: General Level of consciousness: awake and alert, oriented and patient cooperative Pain management: pain level controlled Vital Signs Assessment: post-procedure vital signs reviewed and stable Respiratory status: spontaneous breathing, nonlabored ventilation and respiratory function stable Cardiovascular status: blood pressure returned to baseline and stable Postop Assessment: adequate PO intake Anesthetic complications: no   No notable events documented.   Last Vitals:  Vitals:   03/13/24 0953 03/13/24 1003  BP: 122/64 123/80  Pulse: 80 79  Resp: 15   Temp:    SpO2: 100%     Last Pain:  Vitals:   03/13/24 0953  TempSrc:   PainSc: 0-No pain                 Dorothey Gate

## 2024-03-13 NOTE — Transfer of Care (Signed)
 Immediate Anesthesia Transfer of Care Note  Patient: Cindy Griffin  Procedure(s) Performed: Procedure(s): COLONOSCOPY (N/A)  Patient Location: PACU and Endoscopy Unit  Anesthesia Type:General  Level of Consciousness: sedated  Airway & Oxygen Therapy: Patient Spontanous Breathing and Patient connected to nasal cannula oxygen  Post-op Assessment: Report given to RN and Post -op Vital signs reviewed and stable  Post vital signs: Reviewed and stable  Last Vitals:  Vitals:   03/13/24 0827 03/13/24 0943  BP: 136/76 109/64  Pulse: 81 83  Resp: 18 19  Temp: (!) 35.8 C   SpO2: 100% 100%    Complications: No apparent anesthesia complications

## 2024-03-13 NOTE — Anesthesia Preprocedure Evaluation (Addendum)
 Anesthesia Evaluation  Patient identified by MRN, date of birth, ID band Patient awake    Reviewed: Allergy & Precautions, NPO status , Patient's Chart, lab work & pertinent test results  History of Anesthesia Complications Negative for: history of anesthetic complications  Airway Mallampati: I   Neck ROM: Full    Dental  (+) Upper Dentures, Lower Dentures   Pulmonary COPD   Pulmonary exam normal breath sounds clear to auscultation       Cardiovascular hypertension, Normal cardiovascular exam Rhythm:Regular Rate:Normal  Echo 06/16/20:  NORMAL LEFT VENTRICULAR SYSTOLIC FUNCTION   WITH MILD LVH  NORMAL RIGHT VENTRICULAR SYSTOLIC FUNCTION  MILD VALVULAR REGURGITATION  NO VALVULAR STENOSIS  MILD TR, PR  TRIVIAL MR  EF 55%     Neuro/Psych Chronic back pain; vertigo    GI/Hepatic ,GERD  ,,  Endo/Other  diabetes, Type 2  Obesity  Renal/GU      Musculoskeletal  (+) Arthritis ,    Abdominal   Peds  Hematology  (+) REFUSES BLOOD PRODUCTS, JEHOVAH'S WITNESS  Anesthesia Other Findings Last dose of Trulicity 03/02/24.  Reproductive/Obstetrics                             Anesthesia Physical Anesthesia Plan  ASA: 2  Anesthesia Plan: General   Post-op Pain Management:    Induction: Intravenous  PONV Risk Score and Plan: 3 and Propofol infusion, TIVA and Treatment may vary due to age or medical condition  Airway Management Planned: Natural Airway  Additional Equipment:   Intra-op Plan:   Post-operative Plan:   Informed Consent: I have reviewed the patients History and Physical, chart, labs and discussed the procedure including the risks, benefits and alternatives for the proposed anesthesia with the patient or authorized representative who has indicated his/her understanding and acceptance.       Plan Discussed with: CRNA  Anesthesia Plan Comments: (LMA/GETA backup discussed.   Patient consented for risks of anesthesia including but not limited to:  - adverse reactions to medications - damage to eyes, teeth, lips or other oral mucosa - nerve damage due to positioning  - sore throat or hoarseness - damage to heart, brain, nerves, lungs, other parts of body or loss of life  Informed patient about role of CRNA in peri- and intra-operative care.  Patient voiced understanding.)        Anesthesia Quick Evaluation

## 2024-03-13 NOTE — Op Note (Signed)
 [facility] Gastroenterology Patient Name: Cindy Griffin Procedure Date: 03/13/2024 9:25 AM MRN: [id number] Account #: [id number] Date of Birth: [birth year] Admit Type: Outpatient Age: 78 Room: [facility] ENDO ROOM 2 Gender: Female Note Status: Finalized Instrument Name: Nelda Marseille [id number] Procedure:             Colonoscopy Indications:           Surveillance: History of numerous (> 10) adenomas on                         last colonoscopy (< 3 yrs), High risk colon cancer                         surveillance: Personal history of colon cancer, Last                         colonoscopy: March 2024 Providers:             Wyline Mood MD, MD Referring MD:          Wyline Mood MD, MD (Referring MD), Onnie Boer. Sowles,                         MD (Referring MD) Medicines:             Monitored Anesthesia Care Complications:         No immediate complications. Procedure:             Pre-Anesthesia Assessment:                        - Prior to the procedure, a History and Physical was                         performed, and patient medications, allergies and                         sensitivities were reviewed. The patient's tolerance                         of previous anesthesia was reviewed.                        - The risks and benefits of the procedure and the                         sedation options and risks were discussed with the                         patient. All questions were answered and informed                         consent was obtained.                        - ASA Grade Assessment: II - A patient with mild                         systemic disease.  After obtaining informed consent, the colonoscope was                         passed under direct vision. Throughout the procedure,                         the patient's blood pressure, pulse, and oxygen                         saturations were monitored continuously. The                          Colonoscope was introduced through the anus and                         advanced to the the ileocolonic anastomosis. The                         colonoscopy was performed with ease. The patient                         tolerated the procedure well. The quality of the bowel                         preparation was poor. Findings:      The perianal and digital rectal examinations were normal.      There was evidence of a prior end-to-side ileo-colonic anastomosis in       the ascending colon. This was characterized by healthy appearing mucosa.      Multiple medium-mouthed diverticula were found in the sigmoid colon.      The exam was otherwise without abnormality on direct and retroflexion       views. Impression:            - Preparation of the colon was poor.                        - End-to-side ileo-colonic anastomosis, characterized                         by healthy appearing mucosa.                        - Diverticulosis in the sigmoid colon.                        - The examination was otherwise normal on direct and                         retroflexion views.                        - No specimens collected. Recommendation:        - Discharge patient to home (with escort).                        - Resume previous diet.                        - Continue present medications.                        -  Repeat colonoscopy in 8 months because the bowel                         preparation was suboptimal. Procedure Code(s):     --- Professional ---                        [id number], Colonoscopy, flexible; diagnostic, including                         collection of specimen(s) by brushing or washing, when                         performed (separate procedure) Diagnosis Code(s):     --- Professional ---                        Z86.010, Personal history of colonic polyps                        Z85.038, Personal history of other malignant neoplasm                         of large intestine                         Z98.0, Intestinal bypass and anastomosis status                        K57.30, Diverticulosis of large intestine without                         perforation or abscess without bleeding CPT copyright 2022 American Medical Association. All rights reserved. The codes documented in this report are preliminary and upon coder review may  be revised to meet current compliance requirements. Luke Salaam, MD Luke Salaam MD, MD 03/13/2024 9:42:14 AM This report has been signed electronically. Number of Addenda: 0 Note Initiated On: 03/13/2024 9:25 AM Scope Withdrawal Time: 0 hours 7 minutes 6 seconds  Total Procedure Duration: 0 hours 10 minutes 21 seconds  Estimated Blood Loss:  Estimated blood loss: none.      [facility]

## 2024-03-14 ENCOUNTER — Encounter: Payer: Self-pay | Admitting: Gastroenterology

## 2024-03-26 ENCOUNTER — Ambulatory Visit: Admitting: Gastroenterology

## 2024-04-06 ENCOUNTER — Other Ambulatory Visit: Payer: Self-pay | Admitting: Family Medicine

## 2024-04-11 ENCOUNTER — Other Ambulatory Visit: Payer: Self-pay | Admitting: Pharmacist

## 2024-04-11 DIAGNOSIS — E1142 Type 2 diabetes mellitus with diabetic polyneuropathy: Secondary | ICD-10-CM

## 2024-04-11 NOTE — Patient Instructions (Signed)
 Goals Addressed             This Visit's Progress    Pharmacy Goals       If you need to reach out to patient assistance programs regarding refills or to find out the status of your application, you can do so by calling:  [employer] at [phone] [place] [phone]  Our goal A1c is less than 7%. This corresponds with fasting sugars less than 130 and 2 hour after meal sugars less than 180. Please keep a log of your results when checking your blood sugar   Our goal bad cholesterol, or LDL, is less than 70. This is why it is important to continue taking your atorvastatin.  Check your blood pressure twice weekly, and any time you have concerning symptoms like headache, chest pain, dizziness, shortness of breath, or vision changes.   Our goal is less than 130/80.  To appropriately check your blood pressure, make sure you do the following:  1) Avoid caffeine, exercise, or tobacco products for 30 minutes before checking. Empty your bladder. 2) Sit with your back supported in a flat-backed chair. Rest your arm on something flat (arm of the chair, table, etc). 3) Sit still with your feet flat on the floor, resting, for at least 5 minutes.  4) Check your blood pressure. Take 1-2 readings.  5) Write down these readings and bring with you to any provider appointments.  Bring your home blood pressure machine with you to a provider's office for accuracy comparison at least once a year.   Make sure you take your blood pressure medications before you come to any office visit, even if you were asked to fast for labs.   Estelle Grumbles, PharmD, [facility] [phone]

## 2024-04-11 NOTE — Progress Notes (Signed)
 04/11/2024 Name: Cindy Griffin MRN: [id number] DOB: [birth year]  Chief Complaint  Patient presents with   Medication Assistance    Cindy Griffin is a 78 y.o. year old female who presented for a telephone visit.   They were referred to the pharmacist by their PCP for assistance in managing medication access.      Subjective:   Care Team: Primary Care Provider: Sowles, Krichna, MD ; Next Scheduled Visit: 05/18/2024 Cardiologist: [facility]; Next Scheduled Visit: 05/15/2024 Physiatry: Loreta Rome, DO Oncologist: Gwyn Leos, MD ENT Specialist: Lesly Raspberry  Medication Access/Adherence  Current Pharmacy:  [facility] - [place], [place] - [address] [place] [address] Phone: [phone] Fax: [phone]  [facility] [address] ([address]), [place] - [address] [place] ([place]) [place] [address] Phone: [phone] Fax: [phone]  [facility] - [place], [place] - [address] [place] [address] Phone: [phone] Fax: [phone]  [facility] [place], [place] - [address] [place] [address] Phone: [phone] Fax: [phone]   Patient reports affordability concerns with their medications: No  Patient reports access/transportation concerns to their pharmacy: No  Patient reports adherence concerns with their medications:  No     Uses weekly pillbox    Diabetes:   Current medications:  - Trulicity  3 mg weekly on Fridays - metformin  ER 750 - 2 tablets (1500 mg) daily with breakfast - pioglitazone  15 mg daily   Medications tried in the past: glipizide , Invokana , Soliqua , Synjardy , Victoza    Recent blood sugar readings mostly ranging 97-140   Reports felt a bit woozy with morning blood sugar reading of 76 last week after skipped a meal    Current physical activity: uses foot elliptical for ~30 minutes x 2 days/week and walks at mall (to walk on even ground) ~30-60 minutes with sister once/week   Statin therapy: atorvastatin  80 mg daily   Current medication access support: enrolled in patient assistance for Trulicity  from [employer] through 11/28/2024 - Reports has 4 boxes remaining     COPD:   Note patient previously seen by Dr. Jamal Mays with [facility]   Current medications: - Bevespi  inhaler - 2 puffs by mouth twice daily - albuterol  inhaler - 2 puffs every 6 hours as needed for wheezing or shortness of breath   Reports not needing albuterol  as often since using Bevespi  consistently   Current medication access support: enrolled in patient assistance for Bevespi  from [employer] through 11/28/2024    Objective:  Lab Results  Component Value Date   HGBA1C 6.8 (A) 01/16/2024    Lab Results  Component Value Date   CREATININE 0.92 01/30/2024   BUN 13 01/30/2024   NA 140 01/30/2024   K 4.2 01/30/2024   CL 104 01/30/2024   CO2 27 01/30/2024    Lab Results  Component Value Date   CHOL 139 01/16/2024   HDL 69 01/16/2024   LDLCALC 53 01/16/2024   TRIG 88 01/16/2024   CHOLHDL 2.0 01/16/2024    Medications Reviewed Today     Reviewed by Ardis Becton, RPH-CPP (Pharmacist) on 04/11/24 at 0844  Med List Status: <None>   Medication Order Taking? Sig Documenting Provider Last Dose Status Informant  acetaminophen  (TYLENOL ) 500 MG tablet [id number]  Take 500 mg by mouth every 6 (six) hours as needed. [provider]  Active   albuterol  (VENTOLIN  HFA) 108 (90 Base) MCG/ACT inhaler [id number]  INHALE 2 PUFFS EVERY 6 HOURS AS NEEDED FOR WHEEZING OR SHORTNESS OF [name], Krichna, MD  Active   Ascorbic Acid (VITAMIN C) 1000 MG tablet [id number]  Take 1,000 mg by mouth daily. [provider]  Active Self  aspirin  EC 81 MG tablet [id number]  Take 1 tablet (81 mg total) by mouth daily. Sowles,  Krichna, MD  Active Self  atorvastatin  (LIPITOR) 80 MG tablet [id number]  TAKE 1 TABLET EVERY DAY Sowles, Krichna, MD  Active   BD PEN NEEDLE MICRO U/F 32G X 6 MM MISC [id number]  USE ONCE DAILY Ava Lei, Krichna, MD  Active   Blood Glucose Monitoring Suppl (ACCU-CHEK GUIDE ME) w/Device KIT [id number]  USE TO CHECK BLOOD SUGAR ONCE DAILY AS DIRECTED Sowles, Krichna, MD  Active   carvedilol (COREG) 6.25 MG tablet [id number]  Take 6.25 mg by mouth 2 (two) times daily with a meal. [provider]  Active Self  Cholecalciferol (VITAMIN D -3) 1000 units CAPS [id number]  Take 1,000 Units by mouth daily.  [provider]  Active Self  famotidine  (PEPCID ) 20 MG tablet [id number]  TAKE 1 TABLET TWICE DAILY Sowles, Krichna, MD  Active   fluticasone  (FLONASE ) 50 MCG/ACT nasal spray [id number]  USE 2 SPRAYS IN EACH NOSTRIL EVERY DAY Sowles, Krichna, MD  Active   furosemide  (LASIX ) 40 MG tablet [id number]  Take 40 mg by mouth daily. [provider]  Active   glucose blood (ACCU-CHEK GUIDE TEST) test strip [id number]  USE  TO CHECK BLOOD SUGAR ONCE DAILY AS DIRECTED Sowles, Krichna, MD  Active   Glycopyrrolate -Formoterol (BEVESPI  AEROSPHERE) 9-4.8 MCG/ACT AERO [id number]  Take 2 puffs by mouth 2 (two) times daily. Patient receives through [employer] Patient Assistance Sowles, Krichna, MD  Active   Lancets [facility] [place]) [place] [id number]  CHECK GLUCOSE THREE TIMES DAILY [provider]  Active   lisinopril  (ZESTRIL ) 20 MG tablet [id number]  TAKE 1 TABLET AT BEDTIME (NEW DOSE) Sowles, Krichna, MD  Active   loratadine  (CLARITIN ) 10 MG tablet [id number]  TAKE 1 TABLET AT BEDTIME FOR RASH, ITCHING Sowles, Krichna, MD  Active   meclizine  (ANTIVERT ) 25 MG tablet [id number]  Take 25 mg by mouth every 8 (eight) hours as needed. [provider]  Active   metFORMIN  (GLUCOPHAGE -XR) 750 MG 24 hr tablet [id number] Yes TAKE 2 TABLETS EVERY DAY WITH BREAKFAST Sowles, Krichna, MD Taking Active    Multiple Vitamin (MULTIVITAMIN) tablet [id number]  Take 1 tablet by mouth daily. [provider]  Active   Omega-3 1000 MG CAPS [id number]  Take by mouth. [provider]  Active   pioglitazone  (ACTOS ) 15 MG tablet [id number] Yes Take 1 tablet (15 mg total) by mouth daily. Sowles, Krichna, MD Taking Active   polyethylene glycol powder (GLYCOLAX /MIRALAX ) 17 GM/SCOOP powder [id number]  Take 1 Container by mouth as needed. Taking 3 times a week [provider]  Active   pregabalin  (LYRICA ) 75 MG capsule [id number]  Take 1 capsule (75 mg total) by mouth daily. Sowles, Krichna, MD  Active   tiZANidine  (ZANAFLEX ) 2 MG tablet [id number]  1 po tid prn [provider]  Active   traMADol  (ULTRAM ) 50 MG tablet [id number]  Take 25-50 mg by mouth 2 (two) times daily as needed. [provider]  Active   traZODone  (DESYREL ) 50 MG tablet [id number]  Take 50 mg by mouth at bedtime as needed. [provider]  Active   TRULICITY  3 MG/0.5ML SOAJ [id number] Yes INJECT 3 MG (0.5 ML) UNDER THE SKIN ONCE A WEEK Sowles, Krichna, MD Taking Active   vitamin B-12 (CYANOCOBALAMIN ) 500 MCG tablet [id number]  Take 1,000 mcg by mouth daily. [provider]  Active Self              Assessment/Plan:   Diabetes: - Currently controlled - Reviewed long term cardiovascular and renal outcomes of uncontrolled blood sugar - Reviewed goal A1c, goal fasting, and goal 2 hour post prandial glucose - Encourage patient to have regular well-balanced meals throughout the day, while controlling carbohydrate portion sizes  Advise patient against skipping meals - Recommend to check glucose, keep log of results and have this record to review at upcoming medical appointments. Patient to contact provider office sooner if needed for readings outside of established parameters or symptoms - Patient to follow up with [employer] patient assistance program as needed for refills of Trulicity       COPD: - Reviewed appropriate inhaler technique. - Patient to follow up with [employer] patient assistance program as needed for refills of Bevespi        Follow Up Plan: Clinical Pharmacist will follow up with patient by telephone on 07/11/2024 at 8:30 AM     Arthur Lash, PharmD, [facility] [phone]

## 2024-04-18 ENCOUNTER — Other Ambulatory Visit: Payer: Self-pay | Admitting: Family Medicine

## 2024-04-18 DIAGNOSIS — I10 Essential (primary) hypertension: Secondary | ICD-10-CM

## 2024-04-18 DIAGNOSIS — J3089 Other allergic rhinitis: Secondary | ICD-10-CM

## 2024-04-18 DIAGNOSIS — E1142 Type 2 diabetes mellitus with diabetic polyneuropathy: Secondary | ICD-10-CM

## 2024-05-14 ENCOUNTER — Other Ambulatory Visit: Payer: Self-pay | Admitting: Family Medicine

## 2024-05-18 ENCOUNTER — Encounter: Payer: Self-pay | Admitting: Family Medicine

## 2024-05-18 ENCOUNTER — Ambulatory Visit: Payer: Medicare PPO | Admitting: Family Medicine

## 2024-05-18 VITALS — BP 124/76 | HR 87 | Resp 16 | Ht 64.0 in | Wt 225.9 lb

## 2024-05-18 DIAGNOSIS — E1142 Type 2 diabetes mellitus with diabetic polyneuropathy: Secondary | ICD-10-CM | POA: Diagnosis not present

## 2024-05-18 DIAGNOSIS — E538 Deficiency of other specified B group vitamins: Secondary | ICD-10-CM

## 2024-05-18 DIAGNOSIS — E559 Vitamin D deficiency, unspecified: Secondary | ICD-10-CM

## 2024-05-18 DIAGNOSIS — F325 Major depressive disorder, single episode, in full remission: Secondary | ICD-10-CM | POA: Diagnosis not present

## 2024-05-18 DIAGNOSIS — E1159 Type 2 diabetes mellitus with other circulatory complications: Secondary | ICD-10-CM

## 2024-05-18 DIAGNOSIS — G8929 Other chronic pain: Secondary | ICD-10-CM

## 2024-05-18 DIAGNOSIS — E1169 Type 2 diabetes mellitus with other specified complication: Secondary | ICD-10-CM

## 2024-05-18 DIAGNOSIS — Z23 Encounter for immunization: Secondary | ICD-10-CM

## 2024-05-18 DIAGNOSIS — I152 Hypertension secondary to endocrine disorders: Secondary | ICD-10-CM

## 2024-05-18 DIAGNOSIS — I700 Atherosclerosis of aorta: Secondary | ICD-10-CM

## 2024-05-18 DIAGNOSIS — J411 Mucopurulent chronic bronchitis: Secondary | ICD-10-CM

## 2024-05-18 DIAGNOSIS — M5442 Lumbago with sciatica, left side: Secondary | ICD-10-CM

## 2024-05-18 DIAGNOSIS — E669 Obesity, unspecified: Secondary | ICD-10-CM

## 2024-05-18 LAB — POCT GLYCOSYLATED HEMOGLOBIN (HGB A1C): Hemoglobin A1C: 6.8 % — AB (ref 4.0–5.6)

## 2024-05-18 MED ORDER — PREGABALIN 75 MG PO CAPS: 75.0000 mg | ORAL_CAPSULE | Freq: Two times a day (BID) | ORAL | 1 refills | Status: AC

## 2024-05-18 NOTE — Progress Notes (Signed)
 Name: Cindy Griffin   MRN: [id number]    DOB: 10-Nov-1946   Date:05/18/2024       Progress Note  Subjective  Chief Complaint  Chief Complaint  Patient presents with   Medical Management of Chronic Issues   Discussed the use of AI scribe software for clinical note transcription with the patient, who gave verbal consent to proceed.  History of Present Illness Cindy Griffin is a 78 year old female who presents for a follow-up visit.  She is concerned about her vaccinations, specifically the flu shot and pneumonia vaccine. She received a flu shot in October last year and is due for another in the fall. She also received a COVID booster recently. She is considering the PCV20 pneumonia vaccine as her previous pneumonia vaccines were over ten years ago.  She experiences leg cramps at night, primarily in her left leg, which she attributes to her history of knee surgeries. She uses tizanidine  and pregabalin  for muscle relaxation and neuropathy, respectively.  Her diabetes management includes Trulicity  3 mg once a week, metformin  750 mg XR twice daily, and pioglitazone  15 mg daily. Her last A1c was 6.8, indicating stable diabetes control. She monitors her blood sugar levels, which are generally well-controlled. No side effects from these medications.  She has a history of dyslipidemia and is on atorvastatin  80 mg daily. No muscle aches or side effects from this medication. For hypertension, she takes carvedilol 6.25 mg and lisinopril  20 mg daily.  She experiences chronic pain, including neck pain and low back pain with radiation to the left thigh. She takes tramadol  and tizanidine  for pain management, prescribed by another physician.  She reports chronic bronchitis and uses Bevespi  inhaler daily, which she obtains through an assistance program. She notes a reduction in cough frequency but still produces clear to cloudy phlegm.  She has a history of colon cancer and underwent a colonoscopy, which she  is reluctant to repeat. No current constipation issues and has discontinued Miralax .  Her weight has remained stable around 225 pounds. She practices portion control and walking for weight management. She takes B12 and vitamin D  supplements over the counter.    Patient Active Problem List   Diagnosis Date Noted   Adenomatous polyp of colon 02/24/2023   De Quervain's tenosynovitis 10/15/2022   Chronic left shoulder pain 09/17/2022   Hypertension associated with type 2 diabetes mellitus (HCC) 09/17/2022   Major depression in remission (HCC) 09/17/2022   Primary osteoarthritis of first carpometacarpal joint of left hand 08/27/2022   Senile purpura (HCC) 05/17/2022   Bilateral leg edema 05/01/2020   SOBOE (shortness of breath on exertion) 05/01/2020   Colon cancer, ascending (HCC) 05/25/2019   Mild episode of recurrent major depressive disorder (HCC) 08/01/2018   Personal history of colon cancer 05/19/2018   DDD (degenerative disc disease), lumbar 07/28/2015   Spinal stenosis at L4-L5 level 06/12/2015   Benign essential HTN 05/25/2015   Bunion 05/25/2015   Cardiac enlargement 05/25/2015   Cervical radicular pain 05/25/2015   Back pain, chronic 05/25/2015   Osteoarthritis 05/25/2015   Dyslipidemia 05/25/2015   Gastric reflux 05/25/2015   Insomnia 05/25/2015   Eczema intertrigo 05/25/2015   Bronchitis, chronic, mucopurulent (HCC) 05/25/2015   Numerous moles 05/25/2015   Morbid obesity (HCC) 05/25/2015   Perennial allergic rhinitis 05/25/2015   History of artificial joint 05/25/2015   Dyslipidemia associated with type 2 diabetes mellitus (HCC) 05/25/2015   Degeneration of intervertebral disc of cervical region 02/06/2015   Neuritis  or radiculitis due to rupture of lumbar intervertebral disc 01/14/2015   Vitamin D  deficiency 05/04/2010    Past Surgical History:  Procedure Laterality Date   ABDOMINAL HYSTERECTOMY     due to cancer-partial   BACK SURGERY     BREAST EXCISIONAL  BIOPSY Right yrs ago   Benign   BREAST SURGERY     CARPOMETACARPAL (CMC) FUSION OF THUMB Left 07/14/2022   Procedure: LEFT THUMB SUSPENSION CMC ARTHROPLASTY, AND  ENDOSCOPIC LEFT CARPAL TUNNEL RELEASE;  Surgeon: Elner Hahn, MD;  Location: [facility] ORS;  Service: Orthopedics;  Laterality: Left;   COLON SURGERY     COLONOSCOPY N/A 04/11/2015   Procedure: COLONOSCOPY;  Surgeon: Stephens Eis, MD;  Location[facility] END[facility]COPY;  Service: Gastroenterology;  Laterality: N/A;   COLONOSCOPY N/A 03/13/2024   Procedure: COLONOSCOPY;  Surgeon: Luke Salaam, MD;  Location: [facility] ENDOSCOPY;  Service: Gastroenterology;  Laterality: N/A;   COLONOSCOPY WITH PROPOFOL  N/A 03/13/2018   Procedure: COLONOSCOPY WITH PROPOFOL ;  Surgeon: Luke Salaam, MD;  Location: [facility] ENDOSCOPY;  Service: Gastroenterology;  Laterality: N/A;   COLONOSCOPY WITH PROPOFOL  N/A 12/25/2018   Procedure: COLONOSCOPY WITH PROPOFOL ;  Surgeon: Luke Salaam, MD;  Location: [facility] ENDOSCOPY;  Service: Gastroenterology;  Laterality: N/A;   COLONOSCOPY WITH PROPOFOL  N/A 01/22/2022   Procedure: COLONOSCOPY WITH PROPOFOL ;  Surgeon: Luke Salaam, MD;  [facility];  Service: Gastroenterology;  Laterality: N/A;   COLONOSCOPY WITH PROPOFOL  N/A 02/24/2023   Procedure: COLONOSCOPY WITH PROPOFOL ;  Surgeon: Luke Salaam, MD;  Location: [facility] ENDOSCOPY;  Service: Gastroenterology;  Laterality: N/A;   COLONOSCOPY WITH PROPOFOL  N/A 02/25/2023   Procedure: COLONOSCOPY WITH PROPOFOL ;  Surgeon: Luke Salaam, MD;  Location: [facility] ENDOSCOPY;  Service: Gastroenterology;  Laterality: N/A;   JOINT REPLACEMENT     left knee x3,  right knee 2005   LAPAROSCOPIC RIGHT COLECTOMY Right 05/11/2018   Procedure: LAPAROSCOPIC RIGHT HEMICOLECTOMY ERAS PATHWAY;  Surgeon: Melvenia Stabs, MD;  Location: [facility] ORS;  Service: General;  Laterality: Right;   SHOULDER ARTHROSCOPY WITH OPEN ROTATOR CUFF REPAIR Left 05/18/2016   Procedure: SHOULDER ARTHROSCOPY WITH OPEN ROTATOR CUFF  REPAIR,distal clavicle excision, decompression;  Surgeon: Elner Hahn, MD;  Location: [facility] ORS;  Service: Orthopedics;  Laterality: Left;   SPINAL FUSION  1994   C-Spine   Transforaminal Epidural  02/20/2015   Injection into cervical spine- C5-6 Dr. Leeland Pugh    Family History  Problem Relation Age of Onset   Diabetes Mother    Kidney disease Mother    Hypertension Mother    Healthy Father    Diabetes Sister    Kidney disease Sister    Pancreatic cancer Sister    Asthma Daughter    Diabetes Brother    Breast cancer Neg Hx     Social History   Tobacco Use   Smoking status: Never   Smokeless tobacco: Never   Tobacco comments:    smoking cessation materials not required  Substance Use Topics   Alcohol  use: Not Currently    Alcohol /week: 0.0 standard drinks of alcohol      Current Outpatient Medications:    acetaminophen  (TYLENOL ) 500 MG tablet, Take 500 mg by mouth every 6 (six) hours as needed., Disp: , Rfl:    albuterol  (VENTOLIN  HFA) 108 (90 Base) MCG/ACT inhaler, INHALE 2 PUFFS EVERY 6 HOURS AS NEEDED FOR WHEEZING OR SHORTNESS OF BREATH, Disp: 2 each, Rfl: 0   Ascorbic Acid (VITAMIN C) 1000 MG tablet, Take 1,000 mg by mouth daily., Disp: , Rfl:  aspirin  EC 81 MG tablet, Take 1 tablet (81 mg total) by mouth daily., Disp: 90 tablet, Rfl: 1   atorvastatin  (LIPITOR) 80 MG tablet, TAKE 1 TABLET EVERY DAY, Disp: 90 tablet, Rfl: 3   BD PEN NEEDLE MICRO U/F 32G X 6 MM MISC, USE ONCE DAILY, Disp: 100 each, Rfl: 2   Blood Glucose Monitoring Suppl (ACCU-CHEK GUIDE ME) w/Device KIT, USE TO CHECK BLOOD SUGAR ONCE DAILY AS DIRECTED, Disp: 1 kit, Rfl: 0   carvedilol (COREG) 6.25 MG tablet, Take 6.25 mg by mouth 2 (two) times daily with a meal., Disp: , Rfl:    Cholecalciferol (VITAMIN D -3) 1000 units CAPS, Take 1,000 Units by mouth daily. , Disp: , Rfl:    famotidine  (PEPCID ) 20 MG tablet, TAKE 1 TABLET TWICE DAILY, Disp: 180 tablet, Rfl: 3   fluticasone  (FLONASE ) 50 MCG/ACT nasal  spray, USE 2 SPRAYS IN EACH NOSTRIL EVERY DAY, Disp: 48 g, Rfl: 1   furosemide  (LASIX ) 40 MG tablet, Take 40 mg by mouth daily., Disp: , Rfl:    glucose blood (ACCU-CHEK GUIDE TEST) test strip, USE  TO CHECK BLOOD SUGAR ONCE DAILY AS DIRECTED, Disp: 100 each, Rfl: 2   Glycopyrrolate -Formoterol (BEVESPI  AEROSPHERE) 9-4.8 MCG/ACT AERO, Take 2 puffs by mouth 2 (two) times daily. Patient receives through [employer] Patient Assistance, Disp: 32 each, Rfl: 11   Lancets (ONETOUCH DELICA PLUS LANCET33G) MISC, CHECK GLUCOSE THREE TIMES DAILY, Disp: , Rfl:    lisinopril  (ZESTRIL ) 20 MG tablet, TAKE 1 TABLET AT BEDTIME (NEW DOSE), Disp: 90 tablet, Rfl: 1   loratadine  (CLARITIN ) 10 MG tablet, TAKE 1 TABLET AT BEDTIME FOR RASH, ITCHING, Disp: 60 tablet, Rfl: 5   meclizine  (ANTIVERT ) 25 MG tablet, Take 25 mg by mouth every 8 (eight) hours as needed., Disp: , Rfl:    metFORMIN  (GLUCOPHAGE -XR) 750 MG 24 hr tablet, TAKE 2 TABLETS EVERY DAY WITH BREAKFAST, Disp: 180 tablet, Rfl: 3   Multiple Vitamin (MULTIVITAMIN) tablet, Take 1 tablet by mouth daily., Disp: , Rfl:    Omega-3 1000 MG CAPS, Take by mouth., Disp: , Rfl:    pioglitazone  (ACTOS ) 15 MG tablet, Take 1 tablet (15 mg total) by mouth daily., Disp: 90 tablet, Rfl: 3   pregabalin  (LYRICA ) 75 MG capsule, Take 1 capsule (75 mg total) by mouth daily., Disp: 90 capsule, Rfl: 0   tiZANidine  (ZANAFLEX ) 2 MG tablet, 1 po tid prn, Disp: , Rfl:    traMADol  (ULTRAM ) 50 MG tablet, Take 25-50 mg by mouth 2 (two) times daily as needed., Disp: , Rfl:    traZODone  (DESYREL ) 50 MG tablet, Take 50 mg by mouth at bedtime as needed., Disp: , Rfl:    TRULICITY  3 MG/0.5ML SOAJ, INJECT 3 MG (0.5 ML) UNDER THE SKIN ONCE A WEEK, Disp: 8 mL, Rfl: 0   vitamin B-12 (CYANOCOBALAMIN ) 500 MCG tablet, Take 1,000 mcg by mouth daily., Disp: , Rfl:   Allergies  Allergen Reactions   Doxycycline Rash    Itching    I personally reviewed active problem list, medication list, allergies with the  patient/caregiver today.   ROS  Ten systems reviewed and is negative except as mentioned in HPI    Objective Physical Exam  CONSTITUTIONAL: Patient appears well-developed and well-nourished. No distress. HEENT: Head atraumatic, normocephalic, neck supple. CARDIOVASCULAR: Normal rate, regular rhythm and normal heart sounds. No murmur heard. No BLE edema. PULMONARY: Effort normal and breath sounds normal. Lungs clear to auscultation bilaterally. No respiratory distress. ABDOMINAL: There is no tenderness or  distention. MUSCULOSKELETAL: Normal gait. Without gross motor or sensory deficit. PSYCHIATRIC: Patient has a normal mood and affect. Behavior is normal. Judgment and thought content normal.  Vitals:   05/18/24 1000  BP: 124/76  Pulse: 87  Resp: 16  SpO2: 99%  Weight: 225 lb 14.4 oz (102.5 kg)  Height: 5' 4 (1.626 m)    Body mass index is 38.78 kg/m.  Recent Results (from the past 2160 hours)  Glucose, capillary     Status: None   Collection Time: 03/13/24  8:43 AM  Result Value Ref Range   Glucose-Capillary 94 70 - 99 mg/dL    Comment: Glucose reference range applies only to samples taken after fasting for at least 8 hours.  POCT glycosylated hemoglobin (Hb A1C)     Status: Abnormal   Collection Time: 05/18/24 10:02 AM  Result Value Ref Range   Hemoglobin A1C 6.8 (A) 4.0 - 5.6 %   HbA1c POC (<> result, manual entry)     HbA1c, POC (prediabetic range)     HbA1c, POC (controlled diabetic range)      Diabetic Foot Exam:     PHQ2/9:    05/18/2024    9:49 AM 01/16/2024   10:58 AM 09/13/2023   10:33 AM 07/19/2023   10:19 AM 07/14/2023    9:40 AM  Depression screen PHQ 2/9  Decreased Interest 0 0 0 0 0  Down, Depressed, Hopeless 0 0 0 0 0  PHQ - 2 Score 0 0 0 0 0  Altered sleeping 0 0 0 0   Tired, decreased energy 0 0 0 0   Change in appetite 0 0 0 0   Feeling bad or failure about yourself  0 0 0 0   Trouble concentrating 0 0 0 0   Moving slowly or  fidgety/restless 0 0 0 0   Suicidal thoughts 0 0 0 0   PHQ-9 Score 0 0 0 0   Difficult doing work/chores Not difficult at all Not difficult at all  Not difficult at all     phq 9 is positive  Fall Risk:    05/18/2024    9:49 AM 01/16/2024   10:47 AM 09/13/2023   10:33 AM 07/19/2023   10:19 AM 07/14/2023    9:26 AM  Fall Risk   Falls in the past year? 0 0 1 1 1   Number falls in past yr: 0 0 1 1 1   Injury with Fall? 0 0 0 1 0  Risk for fall due to : No Fall Risks No Fall Risks History of fall(s) Impaired balance/gait History of fall(s);Impaired balance/gait;Orthopedic patient  Follow up Falls prevention discussed;Education provided;Falls evaluation completed Falls prevention discussed;Education provided;Falls evaluation completed Falls evaluation completed;Education provided;Falls prevention discussed Falls prevention discussed;Education provided;Falls evaluation completed Education provided;Falls prevention discussed      Assessment & Plan Type 2 diabetes mellitus with complications Type 2 diabetes with hypertension, dyslipidemia, obesity, and neuropathy. A1c 6.8. Blood glucose well controlled with occasional dietary-related elevations. Trulicity  covered by insurance but less effective for weight loss than Ozempic . - Continue Trulicity  3 mg once weekly. - Continue metformin  750 mg XR, two tablets daily. - Continue pioglitazone  15 mg daily. - Discuss dietary modifications and portion control for weight management.  Hypertension - Continue carvedilol 6.25 mg daily. - Continue lisinopril  20 mg daily.  Dyslipidemia Dyslipidemia managed with atorvastatin . No side effects reported. - Continue atorvastatin  80 mg daily.  Morbid Obesity - BMI over 35 with co-morbidities such as HTN and dyslipidemia  Obesity with stable weight. Engaging in portion control and walking. - Encourage continued portion control and regular physical activity.  Neuropathy due to back pain  Neuropathy with left  leg pain, contributing to nocturnal cramps. Prefers current pregabalin  dosage but will adjust for nocturnal symptoms. - Adjust pregabalin  to 75 mg twice daily, taking one dose at night.  Chronic bronchitis Chronic bronchitis with occasional cough and sputum. Managed effectively with Bevespi  inhaler. - Continue Bevespi  inhaler as prescribed.  Chronic pain syndrome Chronic pain syndrome with neck, joint, and low back pain. Managed by another provider.  Colon cancer Colon cancer with previous CEA level of 6.6. Resistant to repeat colonoscopy despite recommendations. - Discuss the importance of follow-up colonoscopy with her.  General Health Maintenance Up to date on most vaccinations. Flu shot due in fall. PCV20 recommended. COVID-19 booster recently administered. - Administer PCV20 vaccine today. - Schedule flu shot for early October.

## 2024-05-18 NOTE — Patient Instructions (Signed)
 Magnesium glycinate

## 2024-06-05 ENCOUNTER — Other Ambulatory Visit: Payer: Self-pay | Admitting: Family Medicine

## 2024-06-05 DIAGNOSIS — E1142 Type 2 diabetes mellitus with diabetic polyneuropathy: Secondary | ICD-10-CM

## 2024-06-05 DIAGNOSIS — I10 Essential (primary) hypertension: Secondary | ICD-10-CM

## 2024-06-21 ENCOUNTER — Other Ambulatory Visit: Payer: Self-pay | Admitting: Family Medicine

## 2024-06-22 ENCOUNTER — Other Ambulatory Visit: Payer: Self-pay | Admitting: Family Medicine

## 2024-06-22 DIAGNOSIS — J3089 Other allergic rhinitis: Secondary | ICD-10-CM

## 2024-06-22 NOTE — Telephone Encounter (Signed)
 Copied from CRM [id number]. Topic: Clinical - Medication Refill >> Jun 22, 2024  1:42 PM Tiffany S wrote: Medication: fluticasone  (FLONASE ) 50 MCG/ACT nasal spray [[id number]]  Has the patient contacted their pharmacy? Yes (Agent: If no, request that the patient contact the pharmacy for the refill. If patient does not wish to contact the pharmacy document the reason why and proceed with request.) (Agent: If yes, when and what did the pharmacy advise?)  This is the patient's preferred pharmacy:  [facility] - [place], [place] - [address] [place] [address] Phone: [phone] Fax: [phone]   Is this the correct pharmacy for this prescription? Yes If no, delete pharmacy and type the correct one.   Has the prescription been filled recently? Yes  Is the patient out of the medication? Yes  Has the patient been seen for an appointment in the last year OR does the patient have an upcoming appointment? Yes  Can we respond through MyChart? Yes  Agent: Please be advised that Rx refills may take up to 3 business days. We ask that you follow-up with your pharmacy.

## 2024-06-25 MED ORDER — FLUTICASONE PROPIONATE 50 MCG/ACT NA SUSP: 2.0000 | Freq: Every day | NASAL | 1 refills | Status: DC

## 2024-06-25 NOTE — Telephone Encounter (Signed)
 Requested Prescriptions  Pending Prescriptions Disp Refills   fluticasone  (FLONASE ) 50 MCG/ACT nasal spray 48 g 1    Sig: Place 2 sprays into both nostrils daily.     Ear, Nose, and Throat: Nasal Preparations - Corticosteroids Passed - 06/25/2024 11:52 AM      Passed - Valid encounter within last 12 months    Recent Outpatient Visits           1 month ago Type 2 diabetes mellitus with peripheral neuropathy [facility])   [facility] Marston, Dorette, MD   5 months ago Type 2 diabetes mellitus with peripheral neuropathy [facility])   [facility] Sowles, Krichna, MD

## 2024-07-09 ENCOUNTER — Telehealth (HOSPITAL_COMMUNITY): Payer: Self-pay | Admitting: Pharmacy Technician

## 2024-07-09 ENCOUNTER — Other Ambulatory Visit (INDEPENDENT_AMBULATORY_CARE_PROVIDER_SITE_OTHER): Payer: Self-pay

## 2024-07-09 ENCOUNTER — Telehealth: Payer: Self-pay | Admitting: Pharmacy Technician

## 2024-07-09 DIAGNOSIS — Z7984 Long term (current) use of oral hypoglycemic drugs: Secondary | ICD-10-CM | POA: Insufficient documentation

## 2024-07-09 DIAGNOSIS — E1142 Type 2 diabetes mellitus with diabetic polyneuropathy: Secondary | ICD-10-CM

## 2024-07-09 DIAGNOSIS — Z7985 Long-term (current) use of injectable non-insulin antidiabetic drugs: Secondary | ICD-10-CM

## 2024-07-09 NOTE — Telephone Encounter (Signed)
 Error.  Cindy Griffin, CPhT [facility] Office: [phone] Email: [email or web address]

## 2024-07-09 NOTE — Progress Notes (Signed)
   07/09/2024  Patient ID: Cindy Griffin, female   DOB: [birth year], 78 y.o.   MRN: [id number]  PharmD requested assistance with patient's Trulicity  refill from [facility].  Called [facility] and Kaylee informs patient is in need of an updated prescription for Trulicity  3mg . She informs this can be escribed to [facility]. She informs if the request is done today, then a follow up call would need to be made to [facility] to request they process the prescription in the morning for a Wednesday delivery.  Sent inofrmaton to PharmD Peyton Ferries.  Moksh Loomer, CPhT [facility] Office: [phone] Email: [email or web address]

## 2024-07-09 NOTE — Progress Notes (Signed)
    S:    78 y.o. female who presents for diabetes evaluation, education, and management via telehealth. Of note, patient also receives Bevespi  through PAP.   Patient was last seen by Primary Care Provider, Dr. Glenard, on 05/18/24.  At last visit with cardiology on 06/15/24, patient was started on furosemide  for LEE.   Today, patient reports hypoglycemia ~65 mg/dL 2-3 times per week. Additionally, she continues to report issues with LEE.  PMH is significant for COPD, arthritis, GERD, HLD, and HTN. .   Current diabetes medications include: Trulicity  3 mg weekly, pioglitazone  15 mg daily, metformin  750 mg XR 2 tabs daily Current hypertension medications include: carvedilol 6.25 mg BID, lisinopril  20 mg daily, furosemide  40 mg daily  Current hyperlipidemia medications include: atorvastatin  80 mg daily  Patient reports adherence to taking all medications as prescribed.   Do you have any problems obtaining medications due to transportation or finances? no Insurance coverage: [facility] Medicare  Patient reports hypoglycemic events about 2-3x per week.  Reported home fasting blood sugars: 90-130s   Patient reported dietary habits: Eats 2-3 meals/day 24 hour diet recall: Lunch: vegetable soup Dinner: salad Snacks: snack pack of chips, fruit Drinks: sugar-free drinks  Patient-reported exercise habits: walks at mall with sister 2-3x per week for 30-60 minutes  O:   Lab Results  Component Value Date   HGBA1C 6.8 (A) 05/18/2024    Lipid Panel     Component Value Date/Time   CHOL 139 01/16/2024 1143   CHOL 146 11/21/2015 1125   TRIG 88 01/16/2024 1143   HDL 69 01/16/2024 1143   HDL 62 11/21/2015 1125   CHOLHDL 2.0 01/16/2024 1143   VLDL 25 07/15/2017 0858   LDLCALC 53 01/16/2024 1143    Clinical Atherosclerotic Cardiovascular Disease (ASCVD): No  The 10-year ASCVD risk score (Arnett DK, et al., 2019) is: 26.6%   Values used to calculate the score:     Age: 39 years      Clincally relevant sex: Female     Is Non-Hispanic African American: Yes     Diabetic: Yes     Tobacco smoker: No     Systolic Blood Pressure: 110 mmHg     Is BP treated: Yes     HDL Cholesterol: 69 mg/dL     Total Cholesterol: 139 mg/dL    A/P: Diabetes longstanding diagnosis currently controlled. Patient is able to verbalize appropriate hypoglycemia management plan. Medication adherence appears appropriate. Given reported hypoglycemia and complaints of LEE, will hold pioglitazone  therapy at this time.  -Continued GLP-1 Trulicity  (dulaglutide ) 3 mg weekly. Refill pended for PAP -Continued metformin  750 mg XR 2 tablets daily.  -HOLD pioglitazone  15 mg  -Extensively discussed pathophysiology of diabetes, recommended lifestyle interventions, dietary effects on blood sugar control.  -Counseled on s/sx of and management of hypoglycemia.   ASCVD risk - primary prevention in patient with diabetes. Last LDL is 53  at goal of <70 mg/dL.  -Continued atorvastatin  80 mg daily   Written patient instructions provided. Patient verbalized understanding of treatment plan.   Follow-up:  Pharmacist 08/06/24  Peyton CHARLENA Ferries, PharmD Clinical Pharmacist [facility] [phone]

## 2024-07-11 ENCOUNTER — Other Ambulatory Visit: Admitting: Pharmacist

## 2024-07-11 MED ORDER — TRULICITY 3 MG/0.5ML ~~LOC~~ SOAJ: 3.0000 mg | SUBCUTANEOUS | 3 refills | Status: DC

## 2024-07-13 ENCOUNTER — Telehealth: Payer: Self-pay | Admitting: Pharmacy Technician

## 2024-07-13 NOTE — Progress Notes (Signed)
,=    07/13/2024 Name: [name] MRN: [id number] DOB: June 24, 1946  Care coordination call placed to [facility] in regard to Trulicity  refill.  Spoke to [name] who informs updated prescription was received. She informs she will process the refill today for delivery on Saturday  07/14/2024 via [employer]. Patient is on auto refill and prescription has refills.  Successful outreach to patient, HIPAA verified. Patient was informed of delivery date of medication. She was informed it will arrive on ice and will need to be placed in the refrigerator when received. Patient verbalized understanding PharmD also notified of this information.  Donyea Beverlin, CPhT [facility] Office: [phone] Email: [email or web address] .

## 2024-07-19 ENCOUNTER — Ambulatory Visit (INDEPENDENT_AMBULATORY_CARE_PROVIDER_SITE_OTHER): Payer: Medicare PPO

## 2024-07-19 ENCOUNTER — Telehealth: Payer: Self-pay | Admitting: Pharmacy Technician

## 2024-07-19 DIAGNOSIS — Z780 Asymptomatic menopausal state: Secondary | ICD-10-CM

## 2024-07-19 DIAGNOSIS — Z0000 Encounter for general adult medical examination without abnormal findings: Secondary | ICD-10-CM

## 2024-07-19 DIAGNOSIS — Z1231 Encounter for screening mammogram for malignant neoplasm of breast: Secondary | ICD-10-CM

## 2024-07-19 NOTE — Patient Instructions (Signed)
 Cindy Griffin , Thank you for taking time out of your busy schedule to complete your Annual Wellness Visit with me. I enjoyed our conversation and look forward to speaking with you again next year. I, as well as your care team,  appreciate your ongoing commitment to your health goals. Please review the following plan we discussed and let me know if I can assist you in the future.   REFERRAL FOR MAMMOGRAM & BONE DENSITY SENT You have an order for:  []   2D Mammogram  [x]   3D Mammogram  [x]   Bone Density     Please call for appointment:  [facility]  [address] [place] [address] [phone] [facility] [address] [place], [place] [address] [phone] [facility] [address] [place], [place] [address] [phone]   Make sure to wear two-piece clothing.  No lotions, powders, or deodorants the day of the appointment. Make sure to bring picture ID and insurance card.  Bring list of medications you are currently taking including any supplements.   Schedule your [place] screening mammogram through MyChart!   Log into your MyChart account.  Go to 'Visit' (or 'Appointments' if on mobile App) --> Schedule an Appointment  Under 'Select a Reason for Visit' choose the Mammogram Screening option.  Complete the pre-visit questions and select the time and place that best fits your schedule.   Follow up Visits: 07/25/25 @ 9:30 AM BY PHONE We will see or speak with you next year for your Next Medicare AWV with our clinical staff Have you seen your provider in the last 6 months (3 months if uncontrolled diabetes)? Yes  Clinician Recommendations:  Aim for 30 minutes of exercise or brisk walking, 6-8 glasses of water, and 5 servings of fruits and vegetables each day. TAKE CARE!      This is a list of the screenings recommended for you:  Health  Maintenance  Topic Date Due   COVID-19 Vaccine (10 - 2024-25 season) 05/01/2024   Flu Shot  06/29/2024   DEXA scan (bone density measurement)  09/10/2024   Mammogram  09/21/2024   Eye exam for diabetics  10/24/2024   Hemoglobin A1C  11/17/2024   Yearly kidney health urinalysis for diabetes  01/15/2025   Complete foot exam   01/15/2025   Yearly kidney function blood test for diabetes  01/29/2025   Colon Cancer Screening  03/13/2025   Medicare Annual Wellness Visit  07/19/2025   DTaP/Tdap/Td vaccine (3 - Td or Tdap) 01/06/2032   Pneumococcal Vaccine for age over 65  Completed   Hepatitis C Screening  Completed   Zoster (Shingles) Vaccine  Completed   HPV Vaccine  Aged Out   Meningitis B Vaccine  Aged Out    Advanced directives: (In Chart) A copy of your advanced directives are scanned into your chart should your provider ever need it. Advance Care Planning is important because it:  [x]  Makes sure you receive the medical care that is consistent with your values, goals, and preferences  [x]  It provides guidance to your family and loved ones and reduces their decisional burden about whether or not they are making the right decisions based on your wishes.  Follow the link provided in your after visit summary or read over the paperwork we have mailed to you to help you started getting your Advance Directives in place. If you  need assistance in completing these, please reach out to us  so that we can help you!
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
## 2024-07-19 NOTE — Progress Notes (Signed)
   07/19/2024  Patient ID: Cindy Griffin, female   DOB: February 21, 1946, 78 y.o.   MRN: [id number]  Care coordination call placed to [employer] to check shipping status of Trulicity  as had a missed call from the patient.  Spoke to Tyronda who informs Trulicity  was delivered 07/14/24 and left on the porch in front of the door.  Successful outreach to patient, HIPAA verified. Patient informs she received the Trulicity  and appreciated the phone call.  Jacarius Handel, CPhT [facility] Office: [phone] Email: [email or web address]

## 2024-07-26 DIAGNOSIS — Z79899 Other long term (current) drug therapy: Secondary | ICD-10-CM | POA: Diagnosis not present

## 2024-07-26 DIAGNOSIS — M5416 Radiculopathy, lumbar region: Secondary | ICD-10-CM | POA: Diagnosis not present

## 2024-07-26 DIAGNOSIS — M48062 Spinal stenosis, lumbar region with neurogenic claudication: Secondary | ICD-10-CM | POA: Diagnosis not present

## 2024-07-26 DIAGNOSIS — M5412 Radiculopathy, cervical region: Secondary | ICD-10-CM | POA: Diagnosis not present

## 2024-07-26 DIAGNOSIS — M1611 Unilateral primary osteoarthritis, right hip: Secondary | ICD-10-CM | POA: Diagnosis not present

## 2024-07-26 DIAGNOSIS — M4802 Spinal stenosis, cervical region: Secondary | ICD-10-CM | POA: Diagnosis not present

## 2024-07-27 DIAGNOSIS — M48062 Spinal stenosis, lumbar region with neurogenic claudication: Secondary | ICD-10-CM | POA: Diagnosis not present

## 2024-07-27 DIAGNOSIS — M5416 Radiculopathy, lumbar region: Secondary | ICD-10-CM | POA: Diagnosis not present

## 2024-07-29 ENCOUNTER — Other Ambulatory Visit: Payer: Self-pay | Admitting: Family Medicine

## 2024-08-06 ENCOUNTER — Ambulatory Visit (INDEPENDENT_AMBULATORY_CARE_PROVIDER_SITE_OTHER)

## 2024-08-06 VITALS — BP 117/67 | HR 80

## 2024-08-06 DIAGNOSIS — Z7984 Long term (current) use of oral hypoglycemic drugs: Secondary | ICD-10-CM

## 2024-08-06 DIAGNOSIS — E1142 Type 2 diabetes mellitus with diabetic polyneuropathy: Secondary | ICD-10-CM | POA: Diagnosis not present

## 2024-08-06 DIAGNOSIS — Z7985 Long-term (current) use of injectable non-insulin antidiabetic drugs: Secondary | ICD-10-CM | POA: Diagnosis not present

## 2024-08-06 DIAGNOSIS — I10 Essential (primary) hypertension: Secondary | ICD-10-CM | POA: Diagnosis not present

## 2024-08-06 NOTE — Progress Notes (Signed)
 S:     Chief Complaint  Patient presents with   Medication Management    Diabetes    Reason for visit: ?  Cindy Griffin is a 78 y.o. female with a history of diabetes (type 2), who presents today for a follow up diabetes pharmacotherapy visit.? Pertinent PMH also includes COPD, arthritis, GERD, HLD, and HTN.  Care Team: Primary Care Provider: Sowles, Krichna, MD  Patient was last seen by Primary Care Provider, Dr. Glenard, on 05/18/24.  At last visit with cardiology on 06/15/24, patient was started on furosemide  for LEE.    At last visit with clinical pharmacist on 07/09/24, patient reported hypoglycemia ~65 mg/dL 2-3 times per week and c/o LEE. Pioglitazone  was discontinued at that time.   On 07/27/24, patient received a steroid injection into the right S1 foramen.  Today, she denies any hypoglycemic events since pioglitazone  was discontinued. She does endorse some dietary indiscretions and subsequent hyperglycemia. She does not report a noticeable difference since previous visit with LEE.   Current diabetes medications include: Trulicity  3 mg weekly, metformin  750 mg XR 2 tabs daily  Previous diabetes medications include: pioglitazone , Ozempic , Soliqua , Synjardy , glipizide  Current hypertension medications include: carvedilol 6.25 mg BID, lisinopril  20 mg daily, furosemide  40 mg daily  Current hyperlipidemia medications include: atorvastatin  80 mg daily   Patient reports adherence to taking all medications as prescribed.   Have you been experiencing any side effects to the medications prescribed? no Do you have any problems obtaining medications due to transportation or finances? no Insurance coverage: [facility] Medicare   Patient denies hypoglycemic events.  Reported home fasting blood sugars: 80-130s  Random blood sugars: 170-220s  Patient reports neuropathy (nerve pain). Patient reports visual changes. Patient reports self foot exams.   Patient reported dietary habits: Eats  2-3 meals/day  24 hour diet recall: Breakfast: honeynut cheerios Lunch: fried okra, green beans, popcorn shrimp, chicken leg Dinner: leftovers from lunch Snacks: snack pack of chips, fruit Drinks: sugar-free drinks   Patient-reported exercise habits: walks at mall with sister 2-3x per week for 30-60 minutes   DM Prevention:  Statin: Taking; high intensity.?  History of albuminuria? no, last UACR on 01/16/24 = 10 mg/g Last eye exam: 10/2023 Lab Results  Component Value Date   HMDIABEYEEXA No Retinopathy 10/25/2023   Tobacco Use:  Tobacco Use: Low Risk  (07/27/2024)   Received from [facility]   Patient History    Smoking Tobacco Use: Never    Smokeless Tobacco Use: Never    Passive Exposure: Not on file   O:   Vitals:  Wt Readings from Last 3 Encounters:  05/18/24 225 lb 14.4 oz (102.5 kg)  03/13/24 223 lb (101.2 kg)  02/13/24 225 lb 9.6 oz (102.3 kg)   BP Readings from Last 3 Encounters:  08/06/24 117/67  05/18/24 124/76  03/13/24 123/80   Pulse Readings from Last 3 Encounters:  08/06/24 80  05/18/24 87  03/13/24 79     Labs:?  Lab Results  Component Value Date   HGBA1C 6.8 (A) 05/18/2024   HGBA1C 6.8 (A) 01/16/2024   HGBA1C 6.9 (A) 09/13/2023   GLUCOSE 124 (H) 01/30/2024   MICRALBCREAT 10 01/16/2024   MICRALBCREAT 11 01/03/2023   MICRALBCREAT 16 01/05/2022   CREATININE 0.92 01/30/2024   CREATININE 0.85 01/16/2024   CREATININE 1.01 (H) 08/04/2023    Lab Results  Component Value Date   CHOL 139 01/16/2024   LDLCALC 53 01/16/2024   LDLCALC 45 01/03/2023  LDLCALC 48 01/05/2022   HDL 69 01/16/2024   TRIG 88 01/16/2024   TRIG 81 01/03/2023   TRIG 79 01/05/2022   ALT 18 01/30/2024   ALT 16 01/16/2024   AST 22 01/30/2024   AST 16 01/16/2024      Chemistry      Component Value Date/Time   NA 140 01/30/2024 1105   NA 141 11/21/2015 1125   K 4.2 01/30/2024 1105   CL 104 01/30/2024 1105   CO2 27 01/30/2024 1105   BUN 13  01/30/2024 1105   BUN 24 11/21/2015 1125   CREATININE 0.92 01/30/2024 1105   CREATININE 0.85 01/16/2024 1143      Component Value Date/Time   CALCIUM  9.2 01/30/2024 1105   ALKPHOS 65 01/30/2024 1105   AST 22 01/30/2024 1105   ALT 18 01/30/2024 1105   BILITOT 0.6 01/30/2024 1105       The 10-year ASCVD risk score (Arnett DK, et al., 2019) is: 28.4%  Lab Results  Component Value Date   MICRALBCREAT 10 01/16/2024   MICRALBCREAT 11 01/03/2023   MICRALBCREAT 16 01/05/2022   MICRALBCREAT NOTE 12/10/2020   MICRALBCREAT 8 11/12/2019   MICRALBCREAT 7 10/31/2018   MICRALBCREAT 3 08/01/2018    A/P: Diabetes currently controlled with a most recent A1c of 6.8% on 05/18/24. Patient is able to verbalize appropriate hypoglycemia management plan. Medication adherence appears. Some elevated BG readings likely d/t to dietary indiscretions and recent steroid injection. Reports swelling is unchanged since previous visit, but denies any additional hypoglycemic episodes.  -Continued GLP-1 Trulicity  (dulaglutide ) 3 mg weekly -Continued metformin  XR 750 mg 2 tablets daily  -Extensively discussed pathophysiology of diabetes, recommended lifestyle interventions, dietary effects on blood sugar control.  -Counseled on s/sx of and management of hypoglycemia.  -Next A1c anticipated end of 07/2024.  -Counseled to check blood sugar 2-3x per week with a combination of fasting and 2-hr post-prandial readings.  ASCVD risk - primary prevention in patient with diabetes. Last LDL is 53  at goal of <70 mg/dL.  -Continued atorvastatin  80 mg daily  Hypertension longstanding currently controlled. Blood pressure goal of <130/80 mmHg. Medication adherence appropriate.  -Continued carvedilol 6.25 mg BID. -Continued lisinopril  20 mg daily  Written patient instructions provided. Patient verbalized understanding of treatment plan.  Total time in face to face counseling 20 minutes.     Follow-up:  Pharmacist on  11/05/24 PCP clinic visit on 09/18/24  Peyton CHARLENA Ferries, PharmD Clinical Pharmacist [facility] [phone]

## 2024-08-06 NOTE — Progress Notes (Deleted)
    S:    78 y.o. female who presents for diabetes evaluation, education, and management via telehealth. Of note, patient also receives Bevespi  through PAP.   Patient was last seen by Primary Care Provider, Dr. Glenard, on 05/18/24.  At last visit with cardiology on 06/15/24, patient was started on furosemide  for LEE.   At last visit with clinical pharmacist on 07/09/24, patient reported hypoglycemia ~65 mg/dL 2-3 times per week and c/o LEE. Pioglitazone  was discontinued at that time.   PMH is significant for COPD, arthritis, GERD, HLD, and HTN.   Current diabetes medications include: Trulicity  3 mg weekly, metformin  750 mg XR 2 tabs daily Current hypertension medications include: carvedilol 6.25 mg BID, lisinopril  20 mg daily, furosemide  40 mg daily  Current hyperlipidemia medications include: atorvastatin  80 mg daily  Patient reports adherence to taking all medications as prescribed.   Do you have any problems obtaining medications due to transportation or finances? no Insurance coverage: [facility] Medicare  Patient reports hypoglycemic events about 2-3x per week. ***  Reported home fasting blood sugars: 90-130s ***  Patient reported dietary habits: Eats 2-3 meals/day *** 24 hour diet recall: Lunch: vegetable soup Dinner: salad Snacks: snack pack of chips, fruit Drinks: sugar-free drinks  Patient-reported exercise habits: walks at mall with sister 2-3x per week for 30-60 minutes ***  O:   Lab Results  Component Value Date   HGBA1C 6.8 (A) 05/18/2024    Lipid Panel     Component Value Date/Time   CHOL 139 01/16/2024 1143   CHOL 146 11/21/2015 1125   TRIG 88 01/16/2024 1143   HDL 69 01/16/2024 1143   HDL 62 11/21/2015 1125   CHOLHDL 2.0 01/16/2024 1143   VLDL 25 07/15/2017 0858   LDLCALC 53 01/16/2024 1143    Clinical Atherosclerotic Cardiovascular Disease (ASCVD): No  The 10-year ASCVD risk score (Arnett DK, et al., 2019) is: 36.5%   Values used to calculate the  score:     Age: 87 years     Clincally relevant sex: Female     Is Non-Hispanic African American: Yes     Diabetic: Yes     Tobacco smoker: No     Systolic Blood Pressure: 149 mmHg     Is BP treated: Yes     HDL Cholesterol: 69 mg/dL     Total Cholesterol: 139 mg/dL    A/P: Diabetes longstanding diagnosis currently controlled. Patient is able to verbalize appropriate hypoglycemia management plan. Medication adherence appears appropriate. Given reported hypoglycemia and complaints of LEE, will hold pioglitazone  therapy at this time. *** -Continued GLP-1 Trulicity  (dulaglutide ) 3 mg weekly.  -Continued metformin  750 mg XR 2 tablets daily.  -Extensively discussed pathophysiology of diabetes, recommended lifestyle interventions, dietary effects on blood sugar control.  -Counseled on s/sx of and management of hypoglycemia.   ASCVD risk - primary prevention in patient with diabetes. Last LDL is 53  at goal of <70 mg/dL.  -Continued atorvastatin  80 mg daily   Written patient instructions provided. Patient verbalized understanding of treatment plan.   Follow-up:  Pharmacist 08/06/24  Peyton CHARLENA Ferries, PharmD Clinical Pharmacist [facility] [phone]

## 2024-08-23 DIAGNOSIS — M5412 Radiculopathy, cervical region: Secondary | ICD-10-CM | POA: Diagnosis not present

## 2024-08-23 DIAGNOSIS — M5416 Radiculopathy, lumbar region: Secondary | ICD-10-CM | POA: Diagnosis not present

## 2024-08-23 DIAGNOSIS — M48062 Spinal stenosis, lumbar region with neurogenic claudication: Secondary | ICD-10-CM | POA: Diagnosis not present

## 2024-08-23 DIAGNOSIS — M1611 Unilateral primary osteoarthritis, right hip: Secondary | ICD-10-CM | POA: Diagnosis not present

## 2024-08-23 DIAGNOSIS — M4802 Spinal stenosis, cervical region: Secondary | ICD-10-CM | POA: Diagnosis not present

## 2024-08-23 DIAGNOSIS — Z79899 Other long term (current) drug therapy: Secondary | ICD-10-CM | POA: Diagnosis not present

## 2024-08-28 ENCOUNTER — Other Ambulatory Visit: Payer: Self-pay | Admitting: Physical Medicine and Rehabilitation

## 2024-08-28 DIAGNOSIS — M5416 Radiculopathy, lumbar region: Secondary | ICD-10-CM

## 2024-09-03 ENCOUNTER — Ambulatory Visit
Admission: RE | Admit: 2024-09-03 | Discharge: 2024-09-03 | Disposition: A | Discharge status: Home / Self Care | Source: Ambulatory Visit | Attending: Physical Medicine and Rehabilitation | Admitting: Physical Medicine and Rehabilitation

## 2024-09-03 DIAGNOSIS — M5416 Radiculopathy, lumbar region: Secondary | ICD-10-CM

## 2024-09-17 ENCOUNTER — Telehealth: Payer: Self-pay

## 2024-09-17 ENCOUNTER — Other Ambulatory Visit: Payer: Self-pay

## 2024-09-17 ENCOUNTER — Ambulatory Visit: Admitting: Family Medicine

## 2024-09-17 ENCOUNTER — Encounter: Payer: Self-pay | Admitting: Family Medicine

## 2024-09-17 VITALS — BP 124/70 | HR 80 | Resp 16 | Ht 64.0 in | Wt 221.4 lb

## 2024-09-17 DIAGNOSIS — Z23 Encounter for immunization: Secondary | ICD-10-CM

## 2024-09-17 DIAGNOSIS — E559 Vitamin D deficiency, unspecified: Secondary | ICD-10-CM

## 2024-09-17 DIAGNOSIS — E119 Type 2 diabetes mellitus without complications: Secondary | ICD-10-CM

## 2024-09-17 DIAGNOSIS — E1142 Type 2 diabetes mellitus with diabetic polyneuropathy: Secondary | ICD-10-CM

## 2024-09-17 DIAGNOSIS — Z7984 Long term (current) use of oral hypoglycemic drugs: Secondary | ICD-10-CM

## 2024-09-17 DIAGNOSIS — E538 Deficiency of other specified B group vitamins: Secondary | ICD-10-CM

## 2024-09-17 DIAGNOSIS — I10 Essential (primary) hypertension: Secondary | ICD-10-CM

## 2024-09-17 DIAGNOSIS — E669 Obesity, unspecified: Secondary | ICD-10-CM | POA: Diagnosis not present

## 2024-09-17 DIAGNOSIS — J411 Mucopurulent chronic bronchitis: Secondary | ICD-10-CM | POA: Diagnosis not present

## 2024-09-17 DIAGNOSIS — M48062 Spinal stenosis, lumbar region with neurogenic claudication: Secondary | ICD-10-CM | POA: Diagnosis not present

## 2024-09-17 LAB — POCT GLYCOSYLATED HEMOGLOBIN (HGB A1C): Hemoglobin A1C: 7.1 % — AB (ref 4.0–5.6)

## 2024-09-17 MED ORDER — TRULICITY 4.5 MG/0.5ML ~~LOC~~ SOAJ: 4.5000 mg | SUBCUTANEOUS | Status: DC

## 2024-09-17 NOTE — Progress Notes (Signed)
 Name: Cindy Griffin   MRN: [id number]    DOB: Jul 21, 1946   Date:09/17/2024       Progress Note  Subjective  Chief Complaint  Chief Complaint  Patient presents with   Medical Management of Chronic Issues   Discussed the use of AI scribe software for clinical note transcription with the patient, who gave verbal consent to proceed.  History of Present Illness Cindy Griffin is a 78 year old female with lumbar stenosis and type 2 diabetes who presents for a four-month follow-up.  She has ongoing lumbar stenosis with moderate to prominent left and moderate right foraminal stenosis from L3 to S1. She experiences neurogenic claudication, soreness in her legs, and a sensation of squeezing in her right hip while sitting. An MRI has been performed, and she is awaiting further consultation to discuss the results and potential treatment options.  Her type 2 diabetes is managed with metformin  750 mg twice daily and Trulicity  3 mg. Her current A1c is 7.1, up from 6.8. She is aware that corticosteroid treatments for her back could elevate her blood sugar levels. She receives Trulicity  through an assistance program and has two packs left at home. No issues tolerating Trulicity  are reported.  She has a history of hypertension, managed with carvedilol 6.25 mg, Lasix  40 mg, and lisinopril  20 mg daily. No chest pain or palpitations are reported. She also has dyslipidemia associated with diabetes and takes atorvastatin  80 mg without any muscle aches.  She experiences chronic mucopurulent bronchitis, attributed to second-hand smoke exposure, and uses Bevespi  inhaler regularly. She takes loratadine  for allergies and reports daily coughing with phlegm production.  She has obesity with a current weight of 221 lbs, down from 225 lbs. She acknowledges dietary challenges, including baking and consuming bread, which may contribute to her elevated A1c. She is trying to reduce her weight and control her diet to manage her  diabetes.  She has neuropathy associated with diabetes and takes pregabalin  75 mg twice daily for neurogenic claudication pain, which helps manage her symptoms. She also takes Zenatane 2 mg, tramadol  50 mg, and trazodone  for sleep.  She reports financial constraints impacting her ability to manage her health, expressing stress over budgeting her retirement income. She finds solace in her faith to cope with these challenges.    Patient Active Problem List   Diagnosis Date Noted   Type 2 diabetes mellitus with peripheral neuropathy (HCC) 09/17/2024   Long term current use of oral hypoglycemic drug 07/09/2024   Adenomatous polyp of colon 02/24/2023   De Quervain's tenosynovitis 10/15/2022   Chronic left shoulder pain 09/17/2022   Hypertension associated with type 2 diabetes mellitus (HCC) 09/17/2022   Major depression in remission 09/17/2022   Primary osteoarthritis of first carpometacarpal joint of left hand 08/27/2022   Senile purpura 05/17/2022   Bilateral leg edema 05/01/2020   SOBOE (shortness of breath on exertion) 05/01/2020   Colon cancer, ascending (HCC) 05/25/2019   Mild episode of recurrent major depressive disorder 08/01/2018   Personal history of colon cancer 05/19/2018   DDD (degenerative disc disease), lumbar 07/28/2015   Spinal stenosis at L4-L5 level 06/12/2015   Benign essential HTN 05/25/2015   Bunion 05/25/2015   Cardiac enlargement 05/25/2015   Cervical radicular pain 05/25/2015   Back pain, chronic 05/25/2015   Osteoarthritis 05/25/2015   Dyslipidemia 05/25/2015   Gastric reflux 05/25/2015   Insomnia 05/25/2015   Eczema intertrigo 05/25/2015   Bronchitis, chronic, mucopurulent (HCC) 05/25/2015   Numerous moles 05/25/2015  Morbid obesity (HCC) 05/25/2015   Perennial allergic rhinitis 05/25/2015   History of artificial joint 05/25/2015   Dyslipidemia associated with type 2 diabetes mellitus (HCC) 05/25/2015   Degeneration of intervertebral disc of cervical  region 02/06/2015   Neuritis or radiculitis due to rupture of lumbar intervertebral disc 01/14/2015   Vitamin D  deficiency 05/04/2010    Past Surgical History:  Procedure Laterality Date   ABDOMINAL HYSTERECTOMY     due to cancer-partial   BACK SURGERY     BREAST EXCISIONAL BIOPSY Right yrs ago   Benign   BREAST SURGERY     CARPOMETACARPAL (CMC) FUSION OF THUMB Left 07/14/2022   Procedure: LEFT THUMB SUSPENSION CMC ARTHROPLASTY, AND  ENDOSCOPIC LEFT CARPAL TUNNEL RELEASE;  Surgeon: Edie Norleen PARAS, MD;  Location: [facility] ORS;  Service: Orthopedics;  Laterality: Left;   COLON SURGERY     COLONOSCOPY N/A 04/11/2015   Procedure: COLONOSCOPY;  Surgeon: Deward CINDERELLA Piedmont, MD;  Location: [facility] ENDOSCOPY;  Service: Gastroenterology;  Laterality: N/A;   COLONOSCOPY N/A 03/13/2024   Procedure: COLONOSCOPY;  Surgeon: Therisa Bi, MD;  Location: [facility] ENDOSCOPY;  Service: Gastroenterology;  Laterality: N/A;   COLONOSCOPY WITH PROPOFOL  N/A 03/13/2018   Procedure: COLONOSCOPY WITH PROPOFOL ;  Surgeon: Therisa Bi, MD;  [facility] END[facility];  Service: Gastroenterology;  Laterality: N/A;   COLONOSCOPY WITH PROPOFOL  N/A 12/25/2018   Procedure: COLONOSCOPY WITH PROPOFOL ;  Surgeon: Therisa Bi, MD;  Location: [facility] ENDOSCOPY;  Service: Gastroenterology;  Laterality: N/A;   COLONOSCOPY WITH PROPOFOL  N/A 01/22/2022   Procedure: COLONOSCOPY WITH PROPOFOL ;  Surgeon: Therisa Bi, MD;  Location: [facility] ENDOSCOPY;  Service: Gastroenterology;  Laterality: N/A;   COLONOSCOPY WITH PROPOFOL  N/A 02/24/2023   Procedure: COLONOSCOPY WITH PROPOFOL ;  Surgeon: Therisa Bi, MD;  Location: [facility] ENDOSCOPY;  Service: Gastroenterology;  Laterality: N/A;   COLONOSCOPY WITH PROPOFOL  N/A 02/25/2023   Procedure: COLONOSCOPY WITH PROPOFOL ;  Surgeon: Therisa Bi, MD;  Location: [facility] ENDOSCOPY;  Service: Gastroenterology;  Laterality: N/A;   JOINT REPLACEMENT     left knee x3,  right knee 2005   LAPAROSCOPIC RIGHT COLECTOMY Right 05/11/2018    Procedure: LAPAROSCOPIC RIGHT HEMICOLECTOMY ERAS PATHWAY;  Surgeon: Teresa Lonni HERO, MD;  Location: [facility] ORS;  Service: General;  Laterality: Right;   SHOULDER ARTHROSCOPY WITH OPEN ROTATOR CUFF REPAIR Left 05/18/2016   Procedure: SHOULDER ARTHROSCOPY WITH OPEN ROTATOR CUFF REPAIR,distal clavicle excision, decompression;  Surgeon: Norleen PARAS Edie, MD;  Location: [facility] ORS;  Service: Orthopedics;  Laterality: Left;   SPINAL FUSION  1994   C-Spine   Transforaminal Epidural  02/20/2015   Injection into cervical spine- C5-6 Dr. Claudie    Family History  Problem Relation Age of Onset   Diabetes Mother    Kidney disease Mother    Hypertension Mother    Healthy Father    Diabetes Sister    Kidney disease Sister    Pancreatic cancer Sister    Asthma Daughter    Diabetes Brother    Breast cancer Neg Hx     Social History   Tobacco Use   Smoking status: Never   Smokeless tobacco: Never   Tobacco comments:    smoking cessation materials not required  Substance Use Topics   Alcohol  use: Not Currently    Alcohol /week: 0.0 standard drinks of alcohol      Current Outpatient Medications:    acetaminophen  (TYLENOL ) 500 MG tablet, Take 500 mg by mouth every 6 (six) hours as needed., Disp: , Rfl:    albuterol  (VENTOLIN  HFA) 108 (  90 Base) MCG/ACT inhaler, INHALE 2 PUFFS EVERY 6 HOURS AS NEEDED FOR WHEEZING OR SHORTNESS OF BREATH, Disp: 2 each, Rfl: 0   Ascorbic Acid (VITAMIN C) 1000 MG tablet, Take 1,000 mg by mouth daily., Disp: , Rfl:    aspirin  EC 81 MG tablet, Take 1 tablet (81 mg total) by mouth daily., Disp: 90 tablet, Rfl: 1   atorvastatin  (LIPITOR) 80 MG tablet, TAKE 1 TABLET EVERY DAY, Disp: 90 tablet, Rfl: 3   BD PEN NEEDLE MICRO U/F 32G X 6 MM MISC, USE ONCE DAILY, Disp: 100 each, Rfl: 2   Blood Glucose Monitoring Suppl (ACCU-CHEK GUIDE ME) w/Device KIT, USE TO CHECK BLOOD SUGAR ONCE DAILY AS DIRECTED, Disp: 1 kit, Rfl: 0   carvedilol (COREG) 6.25 MG tablet, Take 6.25 mg by mouth 2  (two) times daily with a meal., Disp: , Rfl:    cetirizine (ZYRTEC) 10 MG tablet, Take 10 mg by mouth daily., Disp: , Rfl:    Cholecalciferol (VITAMIN D -3) 1000 units CAPS, Take 1,000 Units by mouth daily. , Disp: , Rfl:    famotidine  (PEPCID ) 20 MG tablet, TAKE 1 TABLET TWICE DAILY, Disp: 180 tablet, Rfl: 3   fluticasone  (FLONASE ) 50 MCG/ACT nasal spray, Place 2 sprays into both nostrils daily., Disp: 48 g, Rfl: 1   furosemide  (LASIX ) 40 MG tablet, Take 40 mg by mouth daily., Disp: , Rfl:    glucose blood (ACCU-CHEK GUIDE TEST) test strip, USE  TO CHECK BLOOD SUGAR ONCE DAILY AS DIRECTED, Disp: 100 each, Rfl: 2   Glycopyrrolate -Formoterol (BEVESPI  AEROSPHERE) 9-4.8 MCG/ACT AERO, Take 2 puffs by mouth 2 (two) times daily. Patient receives through [employer] Patient Assistance, Disp: 32 each, Rfl: 11   Lancets (ONETOUCH DELICA PLUS LANCET33G) MISC, CHECK GLUCOSE THREE TIMES DAILY, Disp: , Rfl:    lisinopril  (ZESTRIL ) 20 MG tablet, TAKE 1 TABLET AT BEDTIME, Disp: 90 tablet, Rfl: 1   loratadine  (CLARITIN ) 10 MG tablet, TAKE 1 TABLET AT BEDTIME FOR RASH, ITCHING, Disp: 60 tablet, Rfl: 5   meclizine  (ANTIVERT ) 25 MG tablet, Take 25 mg by mouth every 8 (eight) hours as needed., Disp: , Rfl:    metFORMIN  (GLUCOPHAGE -XR) 750 MG 24 hr tablet, TAKE 2 TABLETS EVERY DAY WITH BREAKFAST, Disp: 180 tablet, Rfl: 3   Multiple Vitamin (MULTIVITAMIN) tablet, Take 1 tablet by mouth daily., Disp: , Rfl:    Naloxone HCl (NARCAN NA), Place into the nose., Disp: , Rfl:    Omega-3 1000 MG CAPS, Take by mouth., Disp: , Rfl:    pregabalin  (LYRICA ) 75 MG capsule, Take 1 capsule (75 mg total) by mouth 2 (two) times daily., Disp: 180 capsule, Rfl: 1   tiZANidine  (ZANAFLEX ) 2 MG tablet, 1 po tid prn, Disp: , Rfl:    traMADol  (ULTRAM ) 50 MG tablet, Take 25-50 mg by mouth 2 (two) times daily as needed., Disp: , Rfl:    traZODone  (DESYREL ) 50 MG tablet, Take 50 mg by mouth at bedtime as needed., Disp: , Rfl:    vitamin B-12  (CYANOCOBALAMIN ) 500 MCG tablet, Take 1,000 mcg by mouth daily., Disp: , Rfl:   Allergies  Allergen Reactions   Doxycycline Rash    Itching    I personally reviewed active problem list, medication list, allergies, family history with the patient/caregiver today.   ROS  Ten systems reviewed and is negative except as mentioned in HPI    Objective Physical Exam MEASUREMENTS: Weight- 221. CONSTITUTIONAL: Patient appears well-developed and well-nourished.  No distress. HEENT: Head atraumatic, normocephalic, neck  supple. CARDIOVASCULAR: Normal rate, regular rhythm and normal heart sounds.  No murmur heard. No BLE edema. PULMONARY: Effort normal and breath sounds normal. No respiratory distress. ABDOMINAL: There is no tenderness or distention. MUSCULOSKELETAL: Normal gait. Without gross motor or sensory deficit. PSYCHIATRIC: Patient has a normal mood and affect. behavior is normal. Judgment and thought content normal.  Vitals:   09/17/24 1122  BP: 124/70  Pulse: 80  Resp: 16  SpO2: 98%  Weight: 221 lb 6.4 oz (100.4 kg)  Height: 5' 4 (1.626 m)    Body mass index is 38 kg/m.  Recent Results (from the past 2160 hours)  POCT glycosylated hemoglobin (Hb A1C)     Status: Abnormal   Collection Time: 09/17/24 11:28 AM  Result Value Ref Range   Hemoglobin A1C 7.1 (A) 4.0 - 5.6 %   HbA1c POC (<> result, manual entry)     HbA1c, POC (prediabetic range)     HbA1c, POC (controlled diabetic range)      Diabetic Foot Exam:     PHQ2/9:    09/17/2024   10:55 AM 07/19/2024    9:39 AM 05/18/2024    9:49 AM 01/16/2024   10:58 AM 09/13/2023   10:33 AM  Depression screen PHQ 2/9  Decreased Interest 0 0 0 0 0  Down, Depressed, Hopeless 0 0 0 0 0  PHQ - 2 Score 0 0 0 0 0  Altered sleeping 0 0 0 0 0  Tired, decreased energy 0 0 0 0 0  Change in appetite 0 0 0 0 0  Feeling bad or failure about yourself  0 0 0 0 0  Trouble concentrating 0 0 0 0 0  Moving slowly or fidgety/restless 0  0 0 0 0  Suicidal thoughts 0 0 0 0 0  PHQ-9 Score 0 0 0 0 0  Difficult doing work/chores Not difficult at all Not difficult at all Not difficult at all Not difficult at all     phq 9 is negative  Fall Risk:    09/17/2024   10:55 AM 07/19/2024    9:43 AM 05/18/2024    9:49 AM 01/16/2024   10:47 AM 09/13/2023   10:33 AM  Fall Risk   Falls in the past year? 1 1 0 0 1  Number falls in past yr: 0 0 0 0 1  Injury with Fall? 0 0 0 0 0  Risk for fall due to : Impaired balance/gait  No Fall Risks No Fall Risks History of fall(s)  Follow up Falls evaluation completed Falls evaluation completed;Falls prevention discussed Falls prevention discussed;Education provided;Falls evaluation completed Falls prevention discussed;Education provided;Falls evaluation completed Falls evaluation completed;Education provided;Falls prevention discussed      Assessment & Plan Type 2 diabetes mellitus with diabetic polyneuropathy and dyslipidemia A1c increased to 7.1. Dyslipidemia managed with atorvastatin . Discussed corticosteroid injections' impact on blood sugar and low-carb diet importance. - Increase Trulicity  dose to 4.5 mg. - Continue metformin  750 mg twice daily. - Continue atorvastatin  80 mg daily. - Advise low-carb diet for 7 days post-corticosteroid injection. - Monitor blood sugar levels closely.  Morbid obesity BMI over 35 with co-morbidities Weight decreased to 221 lbs. Discussed weight loss importance for diabetes management. - Increase Trulicity  dose to 4.5 mg to aid weight loss. - Encourage dietary modifications to reduce carbohydrate intake.  Essential hypertension/obesity/diabetes syndrome Managed with carvedilol, Lasix , and lisinopril . No symptoms reported. - Continue carvedilol 6.25 mg daily. - Continue Lasix  40 mg daily. - Continue lisinopril  20 mg  daily.  Lymphedema and edema Managed with Lasix . - Continue Lasix  40 mg daily.  Lumbar spinal stenosis with neurogenic  claudication Causing right hip pain. Prefers conservative management. Awaiting MRI results with Dr. Jere. - Follow up with Dr. Jere to discuss MRI results and treatment options. - Continue pregabalin  75 mg twice daily for pain management.  Chronic mucopurulent bronchitis Daily cough and phlegm. Managed with Bevespi  inhaler and loratadine . Secondhand smoke exposure noted. - Continue Bevespi  inhaler as prescribed. - Continue loratadine  for allergies.  Vitamin D  deficiency Managed with supplements. Recent level was 35. - Continue over-the-counter vitamin D  supplementation.  Deficiency of B group vitamins B12 level is 551. - Continue over-the-counter B group vitamin supplementation.

## 2024-09-17 NOTE — Telephone Encounter (Signed)
 PAP renewal for trulicity  from [facility]. Mailed pt portion today and will fax provider portion

## 2024-09-18 ENCOUNTER — Ambulatory Visit: Admitting: Family Medicine

## 2024-09-18 ENCOUNTER — Other Ambulatory Visit: Payer: Self-pay

## 2024-09-18 ENCOUNTER — Other Ambulatory Visit: Payer: Self-pay | Admitting: Family Medicine

## 2024-09-18 MED ORDER — TRULICITY 4.5 MG/0.5ML ~~LOC~~ SOAJ: 4.5000 mg | SUBCUTANEOUS | 1 refills | Status: AC

## 2024-09-20 DIAGNOSIS — Z79899 Other long term (current) drug therapy: Secondary | ICD-10-CM | POA: Diagnosis not present

## 2024-09-20 DIAGNOSIS — M5416 Radiculopathy, lumbar region: Secondary | ICD-10-CM | POA: Diagnosis not present

## 2024-09-20 DIAGNOSIS — M1611 Unilateral primary osteoarthritis, right hip: Secondary | ICD-10-CM | POA: Diagnosis not present

## 2024-09-20 DIAGNOSIS — M48062 Spinal stenosis, lumbar region with neurogenic claudication: Secondary | ICD-10-CM | POA: Diagnosis not present

## 2024-09-20 DIAGNOSIS — M5412 Radiculopathy, cervical region: Secondary | ICD-10-CM | POA: Diagnosis not present

## 2024-09-20 DIAGNOSIS — M4802 Spinal stenosis, cervical region: Secondary | ICD-10-CM | POA: Diagnosis not present

## 2024-09-20 NOTE — Telephone Encounter (Signed)
 Received provider portion [employer] Trulicity  waiting on pt portion along proof of income.

## 2024-09-21 NOTE — Telephone Encounter (Signed)
 Received pt portion back on [employer] Trulicity ,pt did not mail proof of income or pharmacy out of pocket spend left a HIPAA VM.

## 2024-10-04 NOTE — Telephone Encounter (Signed)
 Faxed pt and provider portion [facility] along proof of income. To [facility]

## 2024-10-05 ENCOUNTER — Other Ambulatory Visit: Payer: Self-pay | Admitting: Family Medicine

## 2024-10-05 ENCOUNTER — Other Ambulatory Visit: Payer: Self-pay

## 2024-10-05 DIAGNOSIS — E785 Hyperlipidemia, unspecified: Secondary | ICD-10-CM

## 2024-10-05 DIAGNOSIS — F325 Major depressive disorder, single episode, in full remission: Secondary | ICD-10-CM

## 2024-10-05 DIAGNOSIS — J411 Mucopurulent chronic bronchitis: Secondary | ICD-10-CM

## 2024-10-05 DIAGNOSIS — E1142 Type 2 diabetes mellitus with diabetic polyneuropathy: Secondary | ICD-10-CM

## 2024-10-05 DIAGNOSIS — E1169 Type 2 diabetes mellitus with other specified complication: Secondary | ICD-10-CM

## 2024-10-05 DIAGNOSIS — J3089 Other allergic rhinitis: Secondary | ICD-10-CM

## 2024-10-05 DIAGNOSIS — I152 Hypertension secondary to endocrine disorders: Secondary | ICD-10-CM

## 2024-10-05 DIAGNOSIS — K219 Gastro-esophageal reflux disease without esophagitis: Secondary | ICD-10-CM

## 2024-10-05 DIAGNOSIS — E669 Obesity, unspecified: Secondary | ICD-10-CM

## 2024-10-05 DIAGNOSIS — I10 Essential (primary) hypertension: Secondary | ICD-10-CM

## 2024-10-05 DIAGNOSIS — E1121 Type 2 diabetes mellitus with diabetic nephropathy: Secondary | ICD-10-CM

## 2024-10-05 MED ORDER — FUROSEMIDE 40 MG PO TABS: 40.0000 mg | ORAL_TABLET | Freq: Every day | ORAL | 1 refills | Status: AC

## 2024-10-05 MED ORDER — ATORVASTATIN CALCIUM 80 MG PO TABS: 80.0000 mg | ORAL_TABLET | Freq: Every day | ORAL | 2 refills | Status: AC

## 2024-10-05 MED ORDER — FLUTICASONE PROPIONATE 50 MCG/ACT NA SUSP: 2.0000 | Freq: Every day | NASAL | 1 refills | Status: AC

## 2024-10-05 MED ORDER — CARVEDILOL 6.25 MG PO TABS: 6.2500 mg | ORAL_TABLET | Freq: Two times a day (BID) | ORAL | 2 refills | Status: AC

## 2024-10-05 MED ORDER — METFORMIN HCL ER 750 MG PO TB24: 1500.0000 mg | ORAL_TABLET | Freq: Every day | ORAL | 2 refills | Status: AC

## 2024-10-05 MED ORDER — TRAZODONE HCL 50 MG PO TABS: 50.0000 mg | ORAL_TABLET | Freq: Every evening | ORAL | 1 refills | Status: AC | PRN

## 2024-10-05 MED ORDER — ALBUTEROL SULFATE HFA 108 (90 BASE) MCG/ACT IN AERS: INHALATION_SPRAY | RESPIRATORY_TRACT | 2 refills | Status: AC

## 2024-10-05 MED ORDER — LISINOPRIL 20 MG PO TABS: 20.0000 mg | ORAL_TABLET | Freq: Every day | ORAL | 1 refills | Status: AC

## 2024-10-05 MED ORDER — FAMOTIDINE 20 MG PO TABS: 20.0000 mg | ORAL_TABLET | Freq: Two times a day (BID) | ORAL | 3 refills | Status: AC

## 2024-10-05 NOTE — Telephone Encounter (Signed)
 Copied from CRM #[id number]. Topic: Clinical - Medication Refill >> Oct 05, 2024  2:28 PM [name] wrote: Medication: fluticasone  (FLONASE ) 50 MCG/ACT nasal spray  Has the patient contacted their pharmacy? No (Agent: If no, request that the patient contact the pharmacy for the refill. If patient does not wish to contact the pharmacy document the reason why and proceed with request.) (Agent: If yes, when and what did the pharmacy advise?)  This is the patient's preferred pharmacy:  [facility] [address] ([address]), [place] - [address] [place]) [place] [address] Phone: [phone] Fax: [phone]  Is this the correct pharmacy for this prescription? Yes If no, delete pharmacy and type the correct one.   Has the prescription been filled recently? No  Is the patient out of the medication? Yes  Has the patient been seen for an appointment in the last year OR does the patient have an upcoming appointment? Yes  Can we respond through MyChart? No Patient prefers phone call  Agent: Please be advised that Rx refills may take up to 3 business days. We ask that you follow-up with your pharmacy.

## 2024-10-08 ENCOUNTER — Telehealth: Payer: Self-pay

## 2024-10-08 NOTE — Telephone Encounter (Signed)
 NA

## 2024-10-22 ENCOUNTER — Ambulatory Visit
Admission: RE | Admit: 2024-10-22 | Discharge: 2024-10-22 | Disposition: A | Discharge status: Home / Self Care | Source: Ambulatory Visit | Attending: Family Medicine | Admitting: Family Medicine

## 2024-10-22 ENCOUNTER — Ambulatory Visit
Admission: RE | Admit: 2024-10-22 | Discharge: 2024-10-22 | Disposition: A | Source: Ambulatory Visit | Attending: Family Medicine | Admitting: Family Medicine

## 2024-10-22 DIAGNOSIS — Z1231 Encounter for screening mammogram for malignant neoplasm of breast: Secondary | ICD-10-CM | POA: Insufficient documentation

## 2024-10-22 DIAGNOSIS — Z780 Asymptomatic menopausal state: Secondary | ICD-10-CM

## 2024-10-23 ENCOUNTER — Ambulatory Visit: Admitting: Family Medicine

## 2024-10-24 ENCOUNTER — Ambulatory Visit: Payer: Self-pay | Admitting: Family Medicine

## 2024-10-30 LAB — OPHTHALMOLOGY REPORT-SCANNED

## 2024-11-01 ENCOUNTER — Ambulatory Visit: Admitting: Family Medicine

## 2024-11-05 ENCOUNTER — Ambulatory Visit

## 2024-11-05 DIAGNOSIS — Z7985 Long-term (current) use of injectable non-insulin antidiabetic drugs: Secondary | ICD-10-CM | POA: Diagnosis not present

## 2024-11-05 DIAGNOSIS — E1142 Type 2 diabetes mellitus with diabetic polyneuropathy: Secondary | ICD-10-CM

## 2024-11-05 NOTE — Progress Notes (Signed)
 S:     Reason for visit: ?  Cindy Griffin is a 78 y.o. female with a history of diabetes (type 2), who presents today for a follow up diabetes Face to Face pharmacotherapy visit.? Pertinent PMH also includes  COPD, arthritis, GERD, HLD, and HTN   Care Team: Primary Care Provider: Glenard Mire, MD  At last visit with [facility] on 09/17/24, A1c was up at 7.1% from 6.8%. Patient was instructed to increase Trulicity  from 3 to 4.5 mg weekly.  Today, patient reports she had a few boxes left of her previous Trulicity  dose and she had intended to finish the lower dose prior to increasing to 4.5 mg. She has 2 doses left at this time.    Current diabetes medications include: Trulicity  4.5 mg weekly (taking 3 mg weekly - Fridays), metformin  750 mg XR 2 tabs daily  Previous diabetes medications include: pioglitazone , Ozempic , Soliqua , Synjardy , glipizide  Current hypertension medications include: carvedilol  6.25 mg BID, lisinopril  20 mg daily, furosemide  40 mg daily  Current hyperlipidemia medications include: atorvastatin  80 mg daily   Patient reports adherence to taking all medications as prescribed, except where noted above.   Have you been experiencing any side effects to the medications prescribed? no Do you have any problems obtaining medications due to transportation or finances? no Insurance coverage: [facility] Medicare   Current medication access support:  Trulicity  via [employer]  Bevespi  from [employer]  Patient denies hypoglycemic events.  Reported random blood sugars: 89-187 mg/dL  DM Prevention:  Statin: Taking; high intensity.?  ACE/ARB: yes; lisinopril  Last urinary albumin/creatinine ratio:  Lab Results  Component Value Date   MICRALBCREAT 10 01/16/2024   MICRALBCREAT 11 01/03/2023   MICRALBCREAT 16 01/05/2022   MICRALBCREAT NOTE 12/10/2020   MICRALBCREAT 8 11/12/2019   MICRALBCREAT 7 10/31/2018   MICRALBCREAT 3 08/01/2018   Last eye exam:  Lab Results  Component Value Date    HMDIABEYEEXA No Retinopathy 10/30/2024   Lab Results  Component Value Date   HMDIABEYEEXA No Retinopathy 10/30/2024   Last foot exam: No foot exam found Tobacco Use:  Tobacco Use: Low Risk  (09/20/2024)   Received from [facility]   Patient History    Smoking Tobacco Use: Never    Smokeless Tobacco Use: Never    Passive Exposure: Not on file   O:   Vitals:  Wt Readings from Last 3 Encounters:  09/17/24 221 lb 6.4 oz (100.4 kg)  05/18/24 225 lb 14.4 oz (102.5 kg)  03/13/24 223 lb (101.2 kg)   BP Readings from Last 3 Encounters:  09/17/24 124/70  08/06/24 117/67  05/18/24 124/76   Pulse Readings from Last 3 Encounters:  09/17/24 80  08/06/24 80  05/18/24 87     Labs:?  Lab Results  Component Value Date   HGBA1C 7.1 (A) 09/17/2024   HGBA1C 6.8 (A) 05/18/2024   HGBA1C 6.8 (A) 01/16/2024   GLUCOSE 124 (H) 01/30/2024   MICRALBCREAT 10 01/16/2024   MICRALBCREAT 11 01/03/2023   MICRALBCREAT 16 01/05/2022   CREATININE 0.92 01/30/2024   CREATININE 0.85 01/16/2024   CREATININE 1.01 (H) 08/04/2023    Lab Results  Component Value Date   CHOL 139 01/16/2024   LDLCALC 53 01/16/2024   LDLCALC 45 01/03/2023   LDLCALC 48 01/05/2022   HDL 69 01/16/2024   TRIG 88 01/16/2024   TRIG 81 01/03/2023   TRIG 79 01/05/2022   ALT 18 01/30/2024   ALT 16 01/16/2024   AST 22 01/30/2024  AST 16 01/16/2024      Chemistry      Component Value Date/Time   NA 140 01/30/2024 1105   NA 141 11/21/2015 1125   K 4.2 01/30/2024 1105   CL 104 01/30/2024 1105   CO2 27 01/30/2024 1105   BUN 13 01/30/2024 1105   BUN 24 11/21/2015 1125   CREATININE 0.92 01/30/2024 1105   CREATININE 0.85 01/16/2024 1143      Component Value Date/Time   CALCIUM  9.2 01/30/2024 1105   ALKPHOS 65 01/30/2024 1105   AST 22 01/30/2024 1105   ALT 18 01/30/2024 1105   BILITOT 0.6 01/30/2024 1105       The 10-year ASCVD risk score (Arnett DK, et al., 2019) is: 31.8%  Lab Results   Component Value Date   MICRALBCREAT 10 01/16/2024   MICRALBCREAT 11 01/03/2023   MICRALBCREAT 16 01/05/2022   MICRALBCREAT NOTE 12/10/2020   MICRALBCREAT 8 11/12/2019   MICRALBCREAT 7 10/31/2018   MICRALBCREAT 3 08/01/2018    A/P: Diabetes currently uncontrolled with a most recent A1c of 7.1% on 09/17/24. Random home BG readings range from 89-187 mg/dL. Medication adherence appears appropriate, however, patient has been completing her previous Trulicity  3 mg weekly stock and has 2 additional doses left before she intends to increase to 4.5 mg weekly.  -Increased GLP-1 Trulicity  (dulaglutide ) to 4.5 mg weekly in 2 weeks after completion of all 3 mg stock -Continued metformin  XR 750 mg 2 tablets daily  -Extensively discussed pathophysiology of diabetes, recommended lifestyle interventions, dietary effects on blood sugar control.  -Counseled on s/sx of and management of hypoglycemia.  -Counseled to check blood sugar 2-3x per week   ASCVD risk - primary prevention in patient with diabetes. Last LDL is 53  at goal of <70 mg/dL.  -Continued atorvastatin  80 mg daily  Patient verbalized understanding of treatment plan. Total time patient counseling 30 minutes.  Follow-up:  Pharmacist on 04/08/25 PCP clinic visit on 01/21/25  Peyton CHARLENA Ferries, PharmD, CPP Clinical Pharmacist [facility] [phone]

## 2024-11-06 ENCOUNTER — Other Ambulatory Visit: Payer: Self-pay

## 2024-11-06 DIAGNOSIS — J411 Mucopurulent chronic bronchitis: Secondary | ICD-10-CM

## 2024-11-06 NOTE — Telephone Encounter (Signed)
 Filled and mail out PAP [employer]) to pt home and faxed provider portion today.

## 2024-11-08 NOTE — Telephone Encounter (Signed)
 Received provider portion [employer] Breztri  waiting on pt portion.

## 2024-11-21 NOTE — Telephone Encounter (Signed)
 Received pt portion of PAP [employer] ([employer]),faxed to [employer] along provider portion.

## 2024-11-26 ENCOUNTER — Other Ambulatory Visit (HOSPITAL_COMMUNITY): Payer: Self-pay

## 2024-11-26 NOTE — Telephone Encounter (Signed)
 Contacted [employer] to cancel application for Breztri PAP. Per representative, patient was last approved for Bevespi  until the end of 2024 with a last shipment date on 11/14/23.  Patient endorses medication adherence to Bevespi  and states she has medication at home.   Requested patient to verify Bevespi  is the inhaler she has at home. Patient plans to call back if determined it is an alternate inhaler.   Kahlel Peake E. Marsh, PharmD, CPP Clinical Pharmacist [facility] [phone]

## 2024-11-26 NOTE — Telephone Encounter (Signed)
 Received approval letter from [employer] [place] this is what is on spreadsheet,need to call [employer]  and cancel per [facility] should not be on this medication pt should be on Bevespi . Will mail out pap and faxed provider portion.

## 2024-11-27 MED ORDER — BEVESPI AEROSPHERE 9-4.8 MCG/ACT IN AERO: 2.0000 | INHALATION_SPRAY | Freq: Two times a day (BID) | RESPIRATORY_TRACT | 11 refills | Status: AC

## 2024-11-27 NOTE — Telephone Encounter (Signed)
 Received provider portion [employer] Bevespi  faxed to [employer] today RPH did pap online.

## 2024-11-27 NOTE — Telephone Encounter (Signed)
 Patient has been approved for Bevespi  patient assistance until 11/28/25.  Will send updated prescription to the company.  Gary Gabrielsen E. Marsh, PharmD, CPP Clinical Pharmacist [facility] [phone]

## 2024-11-27 NOTE — Addendum Note (Signed)
 Addended by: MARSH, Mayar Whittier E on: 11/27/2024 09:32 AM   Modules accepted: Orders

## 2024-12-06 NOTE — Telephone Encounter (Signed)
 Received pt's PAP [employer] (Bevespi ) from pt this was already submitted online by [facility] Allyson pap will be index.

## 2025-01-21 ENCOUNTER — Ambulatory Visit: Admitting: Family Medicine

## 2025-02-08 ENCOUNTER — Other Ambulatory Visit

## 2025-02-12 ENCOUNTER — Ambulatory Visit: Admitting: Internal Medicine

## 2025-04-08 ENCOUNTER — Ambulatory Visit

## 2025-07-25 ENCOUNTER — Ambulatory Visit
